# Patient Record
Sex: Female | Born: 1940 | Race: White | Hispanic: No | Smoking: Never smoker
Health system: Southern US, Community
[De-identification: ages and names within clinical notes are randomized; demographics above are authoritative.]

## PROBLEM LIST (undated history)

## (undated) DIAGNOSIS — J449 Chronic obstructive pulmonary disease, unspecified: Secondary | ICD-10-CM

## (undated) DIAGNOSIS — C3490 Malignant neoplasm of unspecified part of unspecified bronchus or lung: Secondary | ICD-10-CM

## (undated) DIAGNOSIS — W19XXXA Unspecified fall, initial encounter: Secondary | ICD-10-CM

## (undated) DIAGNOSIS — R1310 Dysphagia, unspecified: Secondary | ICD-10-CM

## (undated) DIAGNOSIS — E7800 Pure hypercholesterolemia, unspecified: Secondary | ICD-10-CM

## (undated) DIAGNOSIS — I10 Essential (primary) hypertension: Secondary | ICD-10-CM

## (undated) DIAGNOSIS — N183 Chronic kidney disease, stage 3 unspecified: Secondary | ICD-10-CM

## (undated) DIAGNOSIS — R296 Repeated falls: Secondary | ICD-10-CM

## (undated) DIAGNOSIS — R293 Abnormal posture: Secondary | ICD-10-CM

## (undated) DIAGNOSIS — S72009A Fracture of unspecified part of neck of unspecified femur, initial encounter for closed fracture: Secondary | ICD-10-CM

## (undated) DIAGNOSIS — R918 Other nonspecific abnormal finding of lung field: Secondary | ICD-10-CM

## (undated) DIAGNOSIS — I509 Heart failure, unspecified: Secondary | ICD-10-CM

## (undated) DIAGNOSIS — M1990 Unspecified osteoarthritis, unspecified site: Secondary | ICD-10-CM

## (undated) DIAGNOSIS — M6281 Muscle weakness (generalized): Secondary | ICD-10-CM

## (undated) DIAGNOSIS — G459 Transient cerebral ischemic attack, unspecified: Secondary | ICD-10-CM

## (undated) DIAGNOSIS — I639 Cerebral infarction, unspecified: Secondary | ICD-10-CM

## (undated) HISTORY — PX: VARICOSE VEIN SURGERY: SHX832

## (undated) HISTORY — DX: Malignant neoplasm of unspecified part of unspecified bronchus or lung: C34.90

## (undated) HISTORY — PX: BREAST BIOPSY: SHX20

---

## 1973-10-31 HISTORY — PX: ABDOMINAL HYSTERECTOMY: SHX81

## 1998-05-27 ENCOUNTER — Inpatient Hospital Stay (HOSPITAL_COMMUNITY): Admission: AD | Admit: 1998-05-27 | Discharge: 1998-05-28 | Payer: Self-pay | Admitting: Cardiology

## 2001-07-19 ENCOUNTER — Ambulatory Visit (HOSPITAL_COMMUNITY): Admission: RE | Admit: 2001-07-19 | Discharge: 2001-07-19 | Payer: Self-pay | Admitting: Family Medicine

## 2001-07-19 ENCOUNTER — Encounter: Payer: Self-pay | Admitting: Family Medicine

## 2001-07-25 ENCOUNTER — Encounter: Payer: Self-pay | Admitting: Family Medicine

## 2001-07-25 ENCOUNTER — Ambulatory Visit (HOSPITAL_COMMUNITY): Admission: RE | Admit: 2001-07-25 | Discharge: 2001-07-25 | Payer: Self-pay | Admitting: Family Medicine

## 2001-08-02 ENCOUNTER — Encounter: Payer: Self-pay | Admitting: General Surgery

## 2001-08-03 ENCOUNTER — Ambulatory Visit (HOSPITAL_COMMUNITY): Admission: RE | Admit: 2001-08-03 | Discharge: 2001-08-03 | Payer: Self-pay | Admitting: General Surgery

## 2001-08-03 ENCOUNTER — Encounter: Payer: Self-pay | Admitting: General Surgery

## 2003-02-25 ENCOUNTER — Ambulatory Visit (HOSPITAL_COMMUNITY): Admission: RE | Admit: 2003-02-25 | Discharge: 2003-02-25 | Payer: Self-pay | Admitting: Family Medicine

## 2003-02-25 ENCOUNTER — Encounter: Payer: Self-pay | Admitting: Family Medicine

## 2003-07-30 ENCOUNTER — Ambulatory Visit (HOSPITAL_COMMUNITY): Admission: RE | Admit: 2003-07-30 | Discharge: 2003-07-30 | Payer: Self-pay | Admitting: Family Medicine

## 2003-07-30 ENCOUNTER — Encounter: Payer: Self-pay | Admitting: Family Medicine

## 2004-07-02 ENCOUNTER — Ambulatory Visit (HOSPITAL_COMMUNITY): Admission: RE | Admit: 2004-07-02 | Discharge: 2004-07-02 | Payer: Self-pay | Admitting: Family Medicine

## 2004-07-05 ENCOUNTER — Emergency Department (HOSPITAL_COMMUNITY): Admission: EM | Admit: 2004-07-05 | Discharge: 2004-07-05 | Payer: Self-pay | Admitting: Emergency Medicine

## 2006-06-13 ENCOUNTER — Ambulatory Visit (HOSPITAL_COMMUNITY): Admission: RE | Admit: 2006-06-13 | Discharge: 2006-06-13 | Payer: Self-pay | Admitting: Family Medicine

## 2006-06-26 ENCOUNTER — Ambulatory Visit: Payer: Self-pay | Admitting: *Deleted

## 2006-06-26 ENCOUNTER — Ambulatory Visit (HOSPITAL_COMMUNITY): Admission: RE | Admit: 2006-06-26 | Discharge: 2006-06-26 | Payer: Self-pay | Admitting: Family Medicine

## 2009-04-09 ENCOUNTER — Emergency Department (HOSPITAL_COMMUNITY): Admission: EM | Admit: 2009-04-09 | Discharge: 2009-04-09 | Payer: Self-pay | Admitting: Emergency Medicine

## 2011-03-18 NOTE — Op Note (Signed)
[facility]  Patient:    Stephanie [name], Stephanie [name] Visit Number: [id number] MRN: [id number]          Service Type: DSU Location: DAY Attending Physician:  Dalia Heading Dictated by:   Franky Macho, M.D. Proc. Date: 08/03/01 Admit Date:  08/03/2001 Discharge Date: 08/03/2001   CC:         Patrica Duel, M.D.   Operative Report  AGE:  70 years old.  PREOPERATIVE DIAGNOSIS:  Left breast mass, nonpalpable.  POSTOPERATIVE DIAGNOSIS:  Left breast mass, nonpalpable.  PROCEDURE:  Left breast biopsy after needle localization.  SURGEON:  Franky Macho, M.D.  ANESTHESIA:  MAC.  INDICATIONS:  Patient is a 70 year old white female who presents with an abnormal mammogram of the left breast.  The risks and benefits of the procedure including bleeding, infection, and recurrence were fully explained to the patient, who gave informed consent.  DESCRIPTION OF PROCEDURE:  Patient was placed in the supine position.  She had already undergone needle localization in the x-ray department.  The left breast was prepped and draped using the usual sterile technique with Betadine. Xylocaine 1% was used for local anesthesia.  A curvilinear incision was made in the lower aspect of the left breast where the guidewire had been placed.  The guidewire was then followed down to the area of suspicion.  Grossly, a 4-cm oblong mass was found and this was excised.  This mass appeared benign in nature.  Further pathology is pending. Any bleeding was controlled using Bovie electrocautery.  The skin was reapproximated using a 4-0 Vicryl subcuticular suture.  Steri-Strips and dry sterile dressing were applied.  All tape and needle counts were correct at the end of the procedure.  The patient was awakened and transferred to Day Surgery in stable condition.  COMPLICATIONS:  None.  SPECIMEN:  Left breast mass.  BLOOD LOSS:  Minimal. Dictated by:   Franky Macho, M.D. Attending  Physician:  Dalia Heading DD:  08/03/01 TD:  08/04/01 Job: [id number]

## 2011-03-18 NOTE — Procedures (Signed)
[name], [name] NO.:  [id number]   MEDICAL RECORD NO.:  [id number]          PATIENT TYPE:  OUT   LOCATION:  RAD                           FACILITY:  [facility]   PHYSICIAN:  Farris Has. Dorethea Clan, MD  DATE OF BIRTH:  1941-09-13   DATE OF PROCEDURE:  06/26/2006  DATE OF DISCHARGE:                                  ECHOCARDIOGRAM   PRIMARY CARE PHYSICIAN:  Scott A. Gerda Diss, M.D.   TAPE NUMBER:  [id number].   TAPE COUNT:  [id number] through 3244.   This is for LV systolic function.  The technical quality of this study is  adequate.   M-MODE TRACING:  The aorta is 27 mm.   The left atrium is 48 mm.   The septum is 17 mm.   The posterior wall is 15 mm.   Left ventricular diastolic dimension is 38 mm.   Left ventricular systolic dimension is 25 mm.   2-D AND DOPPLER IMAGING:  The left ventricle is normal size.  There is  preserved left ventricular systolic function with an estimated ejection  fraction of 65-70%.  There are no wall motion abnormalities.  There is mild  concentric left ventricular hypertrophy with some upper septal hypertrophy  without LV outflow gradient.   The right ventricle is normal size with normal systolic function.  The  estimated RV systolic pressure is about 30-35 mmHg.   Both atria are dilated, left greater than right.   The aortic valve is sclerotic without stenosis or regurgitation.   Mitral valve has trivial regurgitation, no stenosis.   Tricuspid valve with mild regurgitation.   No pericardial effusion.   The inferior vena cava appears to be normal size.      Farris Has. Dorethea Clan, MD  Electronically Signed     JMH/MEDQ  D:  06/26/2006  T:  06/26/2006  Job:  [id number]   cc:   Lorin Picket A. Gerda Diss, MD  Fax: [phone]

## 2011-03-18 NOTE — H&P (Signed)
[facility]  Patient:    Stephanie [name], Stephanie [name] Visit Number: [id number] MRN: [id number]          Service Type: OUT Location: RAD Attending Physician:  Patrica Duel Dictated by:   Franky Macho, M.D. Admit Date:  07/25/2001 Discharge Date: 07/25/2001   CC:         Patrica Duel, M.D.   History and Physical  AGE:  70  CHIEF COMPLAINT:  Left breast mass, nonpalpable.  HISTORY OF PRESENT ILLNESS:  The patient is a 70 year old white female who is referred for evaluation and treatment of a left breast mass.  The patient does not feel it.  It was found on routine mammography.  She denies any nipple discharge or family history of breast cancer.  She was first pregnant at age 61, had three children, did not breast-feed.  She had a hysterectomy in 1978.  PAST MEDICAL HISTORY:  Otherwise unremarkable.  PAST SURGICAL HISTORY:  As noted above.  Varicose vein stripping in 1983.  CURRENT MEDICATIONS:  None.  ALLERGIES:  No known drug allergies.  REVIEW OF SYSTEMS:  The patient denies drinking or smoking.  She denies any other cardiopulmonary difficulties.  PHYSICAL EXAMINATION:  GENERAL:  Well-developed, well-nourished white female in no acute distress.  VITAL SIGNS:  She is afebrile, and vital signs are stable.  NECK:  Supple.  Without lymphadenopathy.  LUNGS:  Clear to auscultation, with equal breath sounds bilaterally.  HEART:  Regular rate and rhythm.  Without S3, S4, murmurs.  BREASTS:  Right breast examination reveals large, dense breast.  No dominant mass, nipple discharge, or dimpling noted.  The axilla was negative for palpable nodes.  Left breast examination was, likewise, unremarkable.  LABORATORY DATA:  Mammogram and ultrasound reports revealed a 4 x 2.5 cm mass in the lateral aspect of the left breast.  It is solid in nature.  IMPRESSION:  Nonpalpable left breast mass.  PLAN:  The patient is scheduled for left breast biopsy after  needle localization on August 03, 2001.  The risks and benefits of the procedure including bleeding and infection were fully explained to the patient, who gave informed consent. Dictated by:   Franky Macho, M.D. Attending Physician:  Patrica Duel DD:  08/02/01 TD:  08/02/01 Job: [id number]

## 2011-09-17 ENCOUNTER — Inpatient Hospital Stay (HOSPITAL_COMMUNITY): Payer: Medicare PPO

## 2011-09-17 ENCOUNTER — Emergency Department (HOSPITAL_COMMUNITY): Payer: Medicare PPO

## 2011-09-17 ENCOUNTER — Inpatient Hospital Stay (HOSPITAL_COMMUNITY)
Admission: EM | Admit: 2011-09-17 | Discharge: 2011-09-19 | DRG: 066 | Disposition: A | Discharge status: Home / Self Care | Payer: Medicare PPO | Attending: Internal Medicine | Admitting: Internal Medicine

## 2011-09-17 ENCOUNTER — Other Ambulatory Visit: Payer: Self-pay

## 2011-09-17 DIAGNOSIS — J984 Other disorders of lung: Secondary | ICD-10-CM | POA: Diagnosis present

## 2011-09-17 DIAGNOSIS — I6350 Cerebral infarction due to unspecified occlusion or stenosis of unspecified cerebral artery: Principal | ICD-10-CM | POA: Diagnosis present

## 2011-09-17 DIAGNOSIS — I639 Cerebral infarction, unspecified: Secondary | ICD-10-CM | POA: Diagnosis present

## 2011-09-17 DIAGNOSIS — E039 Hypothyroidism, unspecified: Secondary | ICD-10-CM | POA: Diagnosis present

## 2011-09-17 DIAGNOSIS — I10 Essential (primary) hypertension: Secondary | ICD-10-CM | POA: Diagnosis present

## 2011-09-17 DIAGNOSIS — R471 Dysarthria and anarthria: Secondary | ICD-10-CM | POA: Diagnosis present

## 2011-09-17 DIAGNOSIS — E785 Hyperlipidemia, unspecified: Secondary | ICD-10-CM | POA: Diagnosis present

## 2011-09-17 HISTORY — DX: Essential (primary) hypertension: I10

## 2011-09-17 HISTORY — DX: Unspecified osteoarthritis, unspecified site: M19.90

## 2011-09-17 LAB — CBC
MCH: 32.5 pg (ref 26.0–34.0)
MCHC: 33.9 g/dL (ref 30.0–36.0)
MCV: 95.6 fL (ref 78.0–100.0)
Platelets: 218 10*3/uL (ref 150–400)
RDW: 12.1 % (ref 11.5–15.5)

## 2011-09-17 LAB — COMPREHENSIVE METABOLIC PANEL
ALT: 13 U/L (ref 0–35)
CO2: 28 mEq/L (ref 19–32)
Calcium: 10.1 mg/dL (ref 8.4–10.5)
GFR calc Af Amer: 56 mL/min — ABNORMAL LOW (ref >90)
GFR calc non Af Amer: 48 mL/min — ABNORMAL LOW (ref >90)
Glucose, Bld: 132 mg/dL — ABNORMAL HIGH (ref 70–99)
Potassium: 4.2 mEq/L (ref 3.5–5.1)
Total Bilirubin: 0.7 mg/dL (ref 0.3–1.2)

## 2011-09-17 LAB — URINE MICROSCOPIC-ADD ON

## 2011-09-17 LAB — URINALYSIS, ROUTINE W REFLEX MICROSCOPIC: Nitrite: NEGATIVE

## 2011-09-17 LAB — POCT I-STAT TROPONIN I: Troponin i, poc: 0.01 ng/mL (ref 0.00–0.08)

## 2011-09-17 LAB — PROTIME-INR: Prothrombin Time: 13.7 seconds (ref 11.6–15.2)

## 2011-09-17 MED ORDER — ONDANSETRON HCL 4 MG/2ML IJ SOLN: 4.0000 mg | Freq: Four times a day (QID) | INTRAMUSCULAR | Status: DC | PRN

## 2011-09-17 MED ORDER — SODIUM CHLORIDE 0.9 % IV SOLN
INTRAVENOUS | Status: DC
  Administered 2011-09-17: 1000 mL via INTRAVENOUS

## 2011-09-17 MED ORDER — SODIUM CHLORIDE 0.9 % IV SOLN
INTRAVENOUS | Status: DC
  Administered 2011-09-17 – 2011-09-18 (×2): via INTRAVENOUS

## 2011-09-17 MED ORDER — AZITHROMYCIN 250 MG PO TABS
500.0000 mg | ORAL_TABLET | Freq: Once | ORAL | Status: AC
  Administered 2011-09-17: 500 mg via ORAL
  Filled 2011-09-17: qty 2

## 2011-09-17 MED ORDER — SODIUM CHLORIDE 0.9 % IJ SOLN
INTRAMUSCULAR | Status: AC
  Administered 2011-09-18: 3 mL
  Filled 2011-09-17: qty 3

## 2011-09-17 MED ORDER — ACETAMINOPHEN 650 MG RE SUPP: 650.0000 mg | RECTAL | Status: DC | PRN

## 2011-09-17 MED ORDER — ACETAMINOPHEN 325 MG PO TABS: 650.0000 mg | ORAL_TABLET | ORAL | Status: DC | PRN

## 2011-09-17 MED ORDER — ATENOLOL 25 MG PO TABS
50.0000 mg | ORAL_TABLET | Freq: Every day | ORAL | Status: DC
  Administered 2011-09-17 – 2011-09-19 (×3): 50 mg via ORAL
  Filled 2011-09-17: qty 2
  Filled 2011-09-17: qty 1
  Filled 2011-09-17: qty 2

## 2011-09-17 MED ORDER — IOHEXOL 300 MG/ML  SOLN
80.0000 mL | Freq: Once | INTRAMUSCULAR | Status: AC | PRN
  Administered 2011-09-17: 80 mL via INTRAVENOUS

## 2011-09-17 MED ORDER — SODIUM CHLORIDE 0.9 % IV SOLN
INTRAVENOUS | Status: AC
  Administered 2011-09-17: 15:00:00 via INTRAVENOUS

## 2011-09-17 MED ORDER — HEPARIN SODIUM (PORCINE) 5000 UNIT/ML IJ SOLN
5000.0000 [IU] | Freq: Three times a day (TID) | INTRAMUSCULAR | Status: DC
  Administered 2011-09-17 – 2011-09-19 (×8): 5000 [IU] via SUBCUTANEOUS
  Filled 2011-09-17 (×7): qty 1

## 2011-09-17 MED ORDER — ASPIRIN 325 MG PO TABS
325.0000 mg | ORAL_TABLET | Freq: Every day | ORAL | Status: DC
  Administered 2011-09-17 – 2011-09-19 (×3): 325 mg via ORAL
  Filled 2011-09-17 (×3): qty 1

## 2011-09-17 MED ORDER — CEFTRIAXONE SODIUM 1 G IJ SOLR
1.0000 g | INTRAMUSCULAR | Status: DC
  Administered 2011-09-17: 1 g via INTRAVENOUS
  Filled 2011-09-17 (×3): qty 10

## 2011-09-17 MED ORDER — SENNOSIDES-DOCUSATE SODIUM 8.6-50 MG PO TABS: 1.0000 | ORAL_TABLET | Freq: Every evening | ORAL | Status: DC | PRN

## 2011-09-17 NOTE — ED Notes (Signed)
Daughter states spoke with pt yesterday around 7pm and pt's speech was normal for pt.  Daughter says pt's speech has been getting worse over the past few years.  Reports she spoke to her on the phone at 0745 this morning and speech was very unclear.  Says sounded like a child on the phone.  Pt went to see Dr. Phillips Odor this morning and he did a neuro exam and said pt 's tongue deviated to one side and she could not puff her cheeks out.  Pt denies any weakness.  Pt c/o dizziness.

## 2011-09-17 NOTE — ED Notes (Signed)
Report given to receiving nurse on floor. NAD

## 2011-09-17 NOTE — ED Provider Notes (Signed)
Scribed for [name], MD, the patient was seen in room [facility]14/[facility]. This chart was scribed by [employer]. The patient's care started at 10:02  CSN: [id number] Arrival date & time: 09/17/2011  9:50 AM   First MD Initiated Contact with Patient 09/17/11 1002      Chief Complaint  Patient presents with  . Stroke Symptoms   HPI Stephanie [name] is a 70 y.o. female who presents to the Emergency Department complaining of Stroke Symptoms. Per daughter, the last time patient was seen normal was at about 6:30pm last night. Patient reports that she went to bed at about 8 pm. She reports that she woke up feeling dizzy. Dizziness is currently resolved. Per daughter, patient's speech is very slow but states that patient walks with normal gait. Daughter states that patient's face doesn't look asymetrical. Denies any changes in vision. Daughter states that patient's speech has been slowly deteriorating for the past five years, with current symptoms worsened than usual. Patient was sent to the ER by Dr Phillips Odor because her tongue was deviated to one side and she was unable to puff her cheeks out. She denies any current weakness.  Past Medical History  Diagnosis Date  . Hypertension     History reviewed. No pertinent past surgical history.  No family history on file.  History  Substance Use Topics  . Smoking status: Never Smoker   . Smokeless tobacco: Not on file  . Alcohol Use: No    OB History    Grav Para Term Preterm Abortions TAB SAB Ect Mult Living                  Review of Systems  Constitutional: Negative for fever.       10 Systems reviewed and are negative for acute change except as noted in the HPI.  HENT: Negative for rhinorrhea.   Eyes: Negative for discharge and redness.  Respiratory: Negative for cough and shortness of breath.   Cardiovascular: Negative for chest pain.  Gastrointestinal: Negative for vomiting, abdominal pain and diarrhea.  Genitourinary: Negative  for dysuria.  Musculoskeletal: Negative for back pain.  Skin: Negative for rash.  Neurological: Positive for speech difficulty (slowed speech). Negative for dizziness, syncope, facial asymmetry, weakness, numbness and headaches.  Psychiatric/Behavioral: Negative for suicidal ideas, hallucinations and confusion.    Allergies  Review of patient's allergies indicates no known allergies.  Home Medications  No current outpatient prescriptions on file.  BP 204/81  Pulse 61  Temp(Src) 97.7 F (36.5 C) (Oral)  Resp 24  Ht 5\' 7"  (1.702 m)  Wt 175 lb (79.379 kg)  BMI 27.41 kg/m2  SpO2 99%  Physical Exam  Nursing note and vitals reviewed. Constitutional: She is oriented to person, place, and time. She appears well-developed and well-nourished. She does not have a sickly appearance.       Awake, alert, nontoxic appearance with baseline speech for patient.  HENT:  Head: Normocephalic and atraumatic.  Mouth/Throat: No oropharyngeal exudate.  Eyes: EOM are normal. Pupils are equal, round, and reactive to light. Right eye exhibits no discharge. Left eye exhibits no discharge.  Neck: Neck supple.  Cardiovascular: Normal rate and regular rhythm.   No murmur heard. Pulmonary/Chest: Effort normal and breath sounds normal. No stridor. No respiratory distress. She has no wheezes. She has no rales. She exhibits no tenderness.  Abdominal: Soft. Bowel sounds are normal. She exhibits no mass. There is no tenderness. There is no rebound.  Musculoskeletal: She exhibits no  tenderness.       Baseline ROM, moves extremities with no obvious new focal weakness.  Lymphadenopathy:    She has no cervical adenopathy.  Neurological: She is alert and oriented to person, place, and time. No cranial nerve deficit.       Awake, alert, cooperative and aware of situation; motor strength bilaterally; sensation normal to light touch bilaterally; peripheral visual fields full to confrontation; no facial asymmetry; tongue  mildly deviated to the right; major cranial nerves appear intact; no pronator drift,   Slowed speech  Skin: Skin is warm and dry. No rash noted.  Psychiatric: She has a normal mood and affect. Her speech is slurred.    ED Course  Procedures  DIAGNOSTIC STUDIES: Oxygen Saturation is 99% on room air, normal by my interpretation.    COORDINATION OF CARE: 10:28 - EDP examined patient at bedside and ordered the following Orders Placed This Encounter  Procedures  . CT Head Wo Contrast  . DG Chest Portable 1 View  . CBC  . Comprehensive metabolic panel  . Protime-INR  . Urinalysis with microscopic  . Cardiac monitoring  . Cardiac monitoring  . Pulse oximetry, continuous  . POCT i-Stat troponin I  . ED EKG  . Saline lock IV    Results for orders placed during the hospital encounter of 09/17/11  CBC      Component Value Range   WBC 7.5  4.0 - 10.5 (K/uL)   RBC 4.59  3.87 - 5.11 (MIL/uL)   Hemoglobin 14.9  12.0 - 15.0 (g/dL)   HCT 91.4  78.2 - 95.6 (%)   MCV 95.6  78.0 - 100.0 (fL)   MCH 32.5  26.0 - 34.0 (pg)   MCHC 33.9  30.0 - 36.0 (g/dL)   RDW 21.3  08.6 - 57.8 (%)   Platelets 218  150 - 400 (K/uL)  COMPREHENSIVE METABOLIC PANEL      Component Value Range   Sodium 137  135 - 145 (mEq/L)   Potassium 4.2  3.5 - 5.1 (mEq/L)   Chloride 102  96 - 112 (mEq/L)   CO2 28  19 - 32 (mEq/L)   Glucose, Bld 132 (*) 70 - 99 (mg/dL)   BUN 18  6 - 23 (mg/dL)   Creatinine, Ser 4.69 (*) 0.50 - 1.10 (mg/dL)   Calcium 62.9  8.4 - 10.5 (mg/dL)   Total Protein 7.9  6.0 - 8.3 (g/dL)   Albumin 4.1  3.5 - 5.2 (g/dL)   AST 21  0 - 37 (U/L)   ALT 13  0 - 35 (U/L)   Alkaline Phosphatase 85  39 - 117 (U/L)   Total Bilirubin 0.7  0.3 - 1.2 (mg/dL)   GFR calc non Af Amer 48 (*) >90 (mL/min)   GFR calc Af Amer 56 (*) >90 (mL/min)  PROTIME-INR      Component Value Range   Prothrombin Time 13.7  11.6 - 15.2 (seconds)   INR 1.03  0.00 - 1.49   POCT I-STAT TROPONIN I      Component Value Range     Troponin i, poc 0.01  0.00 - 0.08 (ng/mL)   Comment 3              Ct Head Wo Contrast  09/17/2011  *RADIOLOGY REPORT*  Clinical Data: Acute onset of abnormal speech pattern. Tongue deviation.  CT HEAD WITHOUT CONTRAST  Technique:  Contiguous axial images were obtained from the base of the skull through the vertex without  contrast.  Comparison: None.  Findings: Chronic microvascular ischemic change is noted.  No evidence of acute infarction, hemorrhage, mass lesion, mass effect, midline shift or abnormal extra-axial fluid collection. There is no hydrocephalus or pneumocephalus.  Atherosclerotic vascular disease is noted.  There is some cortical atrophy.  IMPRESSION: Atrophy and chronic microvascular ischemic change.  No acute abnormality.  Original Report Authenticated By: Bernadene Bell. D'ALESSIO, M.D.    Dg Chest Portable 1 View  09/17/2011  *RADIOLOGY REPORT*  Clinical Data: Stroke, slurred speech  PORTABLE CHEST - 1 VIEW  Comparison: 06/13/2006  Findings: There is a poorly marginated new airspace opacity peripherally in the mid left lung.  Right lung remains clear with somewhat prominent interstitial markings.  No effusion.  Heart size upper limits normal.  Mild thoracic spine degenerative change.  IMPRESSION:  1.  Ill-defined peripheral airspace opacity in the left midlung, new since previous exam.  Original Report Authenticated By: Osa Craver, M.D.     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  MDM: Symptoms consistent with a stroke predominantly affecting speech no major motor paralysis or dysfunction. Head CT negative for head bleed. Onset of symptoms were sometime after 6:30 in the ED and  yesterday patient is therefore not a candidate for any TPA modalities. Workup without significant findings other than the chest x-ray which raises questions of some type of air space opacity on the left side. It is possible this could represent a pneumonia or aspiration, patient has not been in the hospital recently or in the past 90 days, patient also does not have any symptoms consistent with an infectious type pneumonia. However to be careful will treat with IV Rocephin and by mouth Zithromax patient has not at this time a candidate for going to the ICU and therefore does not need blood cultures or IV Zithromax. Discussed with the admitting hospitalist team they will admit the patient to a regular medical bed.  CRITICAL CARE Performed by: [name].   Total critical care time: 30  Critical care time was exclusive of separately billable procedures and treating other patients.  Critical care was necessary to treat or prevent imminent or life-threatening deterioration.  Critical care was time spent personally by me on the following activities: development of treatment plan with patient and/or surrogate as well as nursing, discussions with consultants, evaluation of patient's response to treatment, examination of patient, obtaining history from patient or surrogate, ordering and performing treatments and interventions, ordering and review of laboratory studies, ordering and review of radiographic studies, pulse oximetry and re-evaluation of patient's condition.  Impression: Stroke    Production assistant, radio I personally performed the services described in this documentation, which was scribed in my presence. The recorded information has been reviewed and considered.       [name], MD 09/17/11 [phone]

## 2011-09-17 NOTE — ED Notes (Signed)
Phoned charge nurse on floor. Bed to be assigned shortly, family aware.

## 2011-09-17 NOTE — ED Notes (Signed)
Correction, pt says r hand feels weak.

## 2011-09-17 NOTE — ED Notes (Signed)
Room currently dirty. Hospitalist at bedside. NAD.

## 2011-09-17 NOTE — H&P (Signed)
Stephanie [name] MRN: [id number] DOB/AGE: 69-Aug-1942 69 y.o. Primary Care Physician:Dr Phillips Odor Admit date: 09/17/2011 Chief Complaint: Abnormal speech, right arm weakness. HPI: This 70 year old lady, who has a history of hypertension, presents with the above symptoms. At 3 AM this morning she woke up and felt somewhat lightheaded. She went to the bathroom and then fell back to sleep. She then awoke again at 7 AM whereupon she felt that her right arm was slightly weak and that her speech was abnormal. She called her daughter on the telephone who immediately recognized that her speech was clearly abnormal. The problem with her speech appeared to be that it was slurred but also that she had difficulty saying the words that she wanted to say. She denies any weakness in her legs. She thinks her gait, however, was somewhat abnormal with unsteadiness. She now presents to the emergency room with the above symptoms and we are asked to admit this patient. CT brain scan has been done and is unremarkable.    Past medical history: 1. Hypertension. 2. Apparent stroke in 2003. 3. Left breast lump, excised, benign in 2004.      Family history: Noncontributory.  Social history: She has been a widow since the last 5 years and lives alone. She has never smoked cigarettes. She does not drink alcohol. 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  Allergies: No Known Allergies  Medications Prior to Admission  Medication Dose Route Frequency Provider Last Rate Last Dose  . 0.9 %  sodium chloride infusion   Intravenous Continuous [name], MD 100 mL/hr at 09/17/11 1045 1,000 mL at 09/17/11 1045  . azithromycin (ZITHROMAX) tablet 500 mg  500 mg Oral Once [name], MD   500 mg at 09/17/11 1308  . cefTRIAXone (ROCEPHIN) 1 g in dextrose 5 % 50 mL IVPB  1 g Intravenous Q24H [name], MD   1 g at 09/17/11 1310   No current outpatient prescriptions on file as of 09/17/2011.        WUJ:WJXBJ from the symptoms mentioned above,there are no other symptoms referable to all systems reviewed.  Physical Exam: Blood pressure 148/53, pulse 56, temperature 98.1 F (36.7 C), temperature source Oral, resp. rate 16, height 5\' 7"  (1.702 m), weight 79.379 kg (175 lb), SpO2 96.00%. She looks systemically well. She has dysarthria. She has tongue deviation to the right. She does not appear to have facial asymmetry. Visual fields seem to be intact. There are no carotid bruits. Examination of her limbs shows slight weakness in the right arm but only marginally so compared to the left. Both legs have equal strength. The right plantar is upgoing. There are no cerebellar signs. Her cognition is normal and she does not have any dysphasia at this point. Cardiovascular: Heart sounds are present and in sinus rhythm. There are no murmurs. Respiratory: Lung fields are clear. There is no lymphadenopathy in her neck or supraclavicular area. Abdomen: Soft, nontender, no hepatosplenomegaly. There are no masses.  Results for orders placed during the hospital encounter of 09/17/11 (from the past 48 hour(s))  POCT I-STAT TROPONIN I     Status: Normal   Collection Time   09/17/11 10:02 AM      Component Value Range Comment   Troponin i, poc 0.01  0.00 - 0.08 (ng/mL)    Comment 3            CBC     Status: Normal   Collection Time  09/17/11 10:10 AM      Component Value Range Comment   WBC 7.5  4.0 - 10.5 (K/uL)    RBC 4.59  3.87 - 5.11 (MIL/uL)    Hemoglobin 14.9  12.0 - 15.0 (g/dL)    HCT 16.1  09.6 - 04.5 (%)    MCV 95.6  78.0 - 100.0 (fL)    MCH 32.5  26.0 - 34.0 (pg)    MCHC 33.9  30.0 - 36.0 (g/dL)    RDW 40.9  81.1 - 91.4 (%)    Platelets 218  150 - 400 (K/uL)   COMPREHENSIVE METABOLIC PANEL     Status: Abnormal   Collection Time   09/17/11 10:10 AM      Component Value Range Comment   Sodium 137  135 - 145 (mEq/L)    Potassium 4.2  3.5 - 5.1 (mEq/L)    Chloride 102  96 - 112  (mEq/L)    CO2 28  19 - 32 (mEq/L)    Glucose, Bld 132 (*) 70 - 99 (mg/dL)    BUN 18  6 - 23 (mg/dL)    Creatinine, Ser 7.82 (*) 0.50 - 1.10 (mg/dL)    Calcium 95.6  8.4 - 10.5 (mg/dL)    Total Protein 7.9  6.0 - 8.3 (g/dL)    Albumin 4.1  3.5 - 5.2 (g/dL)    AST 21  0 - 37 (U/L)    ALT 13  0 - 35 (U/L)    Alkaline Phosphatase 85  39 - 117 (U/L)    Total Bilirubin 0.7  0.3 - 1.2 (mg/dL)    GFR calc non Af Amer 48 (*) >90 (mL/min)    GFR calc Af Amer 56 (*) >90 (mL/min)   PROTIME-INR     Status: Normal   Collection Time   09/17/11 10:10 AM      Component Value Range Comment   Prothrombin Time 13.7  11.6 - 15.2 (seconds)    INR 1.03  0.00 - 1.49    URINALYSIS, ROUTINE W REFLEX MICROSCOPIC     Status: Abnormal   Collection Time   09/17/11  1:01 PM      Component Value Range Comment   Color, Urine YELLOW  YELLOW     Appearance CLEAR  CLEAR     Specific Gravity, Urine 1.025  1.005 - 1.030     pH 6.0  5.0 - 8.0     Glucose, UA NEGATIVE  NEGATIVE (mg/dL)    Hgb urine dipstick SMALL (*) NEGATIVE     Bilirubin Urine NEGATIVE  NEGATIVE     Ketones, ur NEGATIVE  NEGATIVE (mg/dL)    Protein, ur 30 (*) NEGATIVE (mg/dL)    Urobilinogen, UA 0.2  0.0 - 1.0 (mg/dL)    Nitrite NEGATIVE  NEGATIVE     Leukocytes, UA NEGATIVE  NEGATIVE    URINE MICROSCOPIC-ADD ON     Status: Abnormal   Collection Time   09/17/11  1:01 PM      Component Value Range Comment   Squamous Epithelial / LPF FEW (*) RARE     WBC, UA 3-6  <3 (WBC/hpf)    RBC / HPF 3-6  <3 (RBC/hpf)    Bacteria, UA FEW (*) RARE       Ct Head Wo Contrast  09/17/2011  *RADIOLOGY REPORT*  Clinical Data: Acute onset of abnormal speech pattern. Tongue deviation.  CT HEAD WITHOUT CONTRAST  Technique:  Contiguous axial images were obtained from the base of the  skull through the vertex without contrast.  Comparison: None.  Findings: Chronic microvascular ischemic change is noted.  No evidence of acute infarction, hemorrhage, mass lesion,  mass effect, midline shift or abnormal extra-axial fluid collection. There is no hydrocephalus or pneumocephalus.  Atherosclerotic vascular disease is noted.  There is some cortical atrophy.  IMPRESSION: Atrophy and chronic microvascular ischemic change.  No acute abnormality.  Original Report Authenticated By: Bernadene Bell. D'ALESSIO, M.D.   Dg Chest Portable 1 View  09/17/2011  *RADIOLOGY REPORT*  Clinical Data: Stroke, slurred speech  PORTABLE CHEST - 1 VIEW  Comparison: 06/13/2006  Findings: There is a poorly marginated new airspace opacity peripherally in the mid left lung.  Right lung remains clear with somewhat prominent interstitial markings.  No effusion.  Heart size upper limits normal.  Mild thoracic spine degenerative change.  IMPRESSION:  1.  Ill-defined peripheral airspace opacity in the left midlung, new since previous exam.  Original Report Authenticated By: Osa Craver, M.D.   Impression: 1. Left brain stroke clinically. Patient is outside the window of opportunity for TPA. 2. Hypertension. 3. Ill defined abnormality in the left midlung without any clear clinical evidence of pneumonia. Patient has never smoked in her life.     Plan: 1. Admit. 2. MRI of the brain. 3. Bilateral carotid Dopplers. 4. CT scan of the chest to further evaluate abnormality in the left mid lung. 5. Aspirin daily. 6. Monitor blood pressure closely. Further recommendations will depend on patient's hospital progress.      GOSRANI,NIMISH C 09/17/2011, 1:49 PM

## 2011-09-17 NOTE — ED Notes (Signed)
Pt is talking with RN, Mardene Celeste. Family at bedside.

## 2011-09-18 ENCOUNTER — Inpatient Hospital Stay (HOSPITAL_COMMUNITY): Payer: Medicare PPO

## 2011-09-18 LAB — BASIC METABOLIC PANEL
CO2: 29 mEq/L (ref 19–32)
Calcium: 9.7 mg/dL (ref 8.4–10.5)
GFR calc Af Amer: 57 mL/min — ABNORMAL LOW (ref >90)
GFR calc non Af Amer: 49 mL/min — ABNORMAL LOW (ref >90)
Sodium: 135 mEq/L (ref 135–145)

## 2011-09-18 LAB — LIPID PANEL
Cholesterol: 207 mg/dL — ABNORMAL HIGH (ref 0–200)
LDL Cholesterol: 130 mg/dL — ABNORMAL HIGH (ref 0–99)
Triglycerides: 103 mg/dL (ref <150)
VLDL: 21 mg/dL (ref 0–40)

## 2011-09-18 MED ORDER — SIMVASTATIN 20 MG PO TABS
20.0000 mg | ORAL_TABLET | Freq: Every day | ORAL | Status: DC
  Administered 2011-09-18: 20 mg via ORAL
  Filled 2011-09-18: qty 1

## 2011-09-18 MED ORDER — ZOLPIDEM TARTRATE 5 MG PO TABS
5.0000 mg | ORAL_TABLET | Freq: Every evening | ORAL | Status: DC | PRN
  Administered 2011-09-18: 5 mg via ORAL
  Filled 2011-09-18: qty 1

## 2011-09-18 MED ORDER — LORAZEPAM 2 MG/ML IJ SOLN
0.5000 mg | Freq: Once | INTRAMUSCULAR | Status: AC
  Administered 2011-09-19: 0.5 mg via INTRAVENOUS
  Filled 2011-09-18: qty 1

## 2011-09-18 NOTE — Progress Notes (Signed)
Subjective: This lady is improved in particular her speech appears to be almost normal. The strength in her right arm also is better. Unfortunately, the CT chest scan raises the possibility of malignancy. Please see below.           Physical Exam: Blood pressure 134/76, pulse 59, temperature 97.8 F (36.6 C), temperature source Oral, resp. rate 20, height 5\' 7"  (1.702 m), weight 83.8 kg (184 lb 11.9 oz), SpO2 98.00%. She looks systemically well. Her dysarthria is less. Indeed, the weakness in the right arm is much improved and she is now normal strength compared to the left arm. Otherwise there are no new physical examination findings.   Investigations: Results for orders placed during the hospital encounter of 09/17/11 (from the past 48 hour(s))  POCT I-STAT TROPONIN I     Status: Normal   Collection Time   09/17/11 10:02 AM      Component Value Range Comment   Troponin i, poc 0.01  0.00 - 0.08 (ng/mL)    Comment 3            CBC     Status: Normal   Collection Time   09/17/11 10:10 AM      Component Value Range Comment   WBC 7.5  4.0 - 10.5 (K/uL)    RBC 4.59  3.87 - 5.11 (MIL/uL)    Hemoglobin 14.9  12.0 - 15.0 (g/dL)    HCT 16.1  09.6 - 04.5 (%)    MCV 95.6  78.0 - 100.0 (fL)    MCH 32.5  26.0 - 34.0 (pg)    MCHC 33.9  30.0 - 36.0 (g/dL)    RDW 40.9  81.1 - 91.4 (%)    Platelets 218  150 - 400 (K/uL)   COMPREHENSIVE METABOLIC PANEL     Status: Abnormal   Collection Time   09/17/11 10:10 AM      Component Value Range Comment   Sodium 137  135 - 145 (mEq/L)    Potassium 4.2  3.5 - 5.1 (mEq/L)    Chloride 102  96 - 112 (mEq/L)    CO2 28  19 - 32 (mEq/L)    Glucose, Bld 132 (*) 70 - 99 (mg/dL)    BUN 18  6 - 23 (mg/dL)    Creatinine, Ser 7.82 (*) 0.50 - 1.10 (mg/dL)    Calcium 95.6  8.4 - 10.5 (mg/dL)    Total Protein 7.9  6.0 - 8.3 (g/dL)    Albumin 4.1  3.5 - 5.2 (g/dL)    AST 21  0 - 37 (U/L)    ALT 13  0 - 35 (U/L)    Alkaline Phosphatase 85  39 - 117 (U/L)     Total Bilirubin 0.7  0.3 - 1.2 (mg/dL)    GFR calc non Af Amer 48 (*) >90 (mL/min)    GFR calc Af Amer 56 (*) >90 (mL/min)   PROTIME-INR     Status: Normal   Collection Time   09/17/11 10:10 AM      Component Value Range Comment   Prothrombin Time 13.7  11.6 - 15.2 (seconds)    INR 1.03  0.00 - 1.49    URINALYSIS, ROUTINE W REFLEX MICROSCOPIC     Status: Abnormal   Collection Time   09/17/11  1:01 PM      Component Value Range Comment   Color, Urine YELLOW  YELLOW     Appearance CLEAR  CLEAR     Specific Gravity,  Urine 1.025  1.005 - 1.030     pH 6.0  5.0 - 8.0     Glucose, UA NEGATIVE  NEGATIVE (mg/dL)    Hgb urine dipstick SMALL (*) NEGATIVE     Bilirubin Urine NEGATIVE  NEGATIVE     Ketones, ur NEGATIVE  NEGATIVE (mg/dL)    Protein, ur 30 (*) NEGATIVE (mg/dL)    Urobilinogen, UA 0.2  0.0 - 1.0 (mg/dL)    Nitrite NEGATIVE  NEGATIVE     Leukocytes, UA NEGATIVE  NEGATIVE    URINE MICROSCOPIC-ADD ON     Status: Abnormal   Collection Time   09/17/11  1:01 PM      Component Value Range Comment   Squamous Epithelial / LPF FEW (*) RARE     WBC, UA 3-6  <3 (WBC/hpf)    RBC / HPF 3-6  <3 (RBC/hpf)    Bacteria, UA FEW (*) RARE    LIPID PANEL     Status: Abnormal   Collection Time   09/18/11  6:40 AM      Component Value Range Comment   Cholesterol 207 (*) 0 - 200 (mg/dL)    Triglycerides 161  <150 (mg/dL)    HDL 56  >09 (mg/dL)    Total CHOL/HDL Ratio 3.7      VLDL 21  0 - 40 (mg/dL)    LDL Cholesterol 604 (*) 0 - 99 (mg/dL)   BASIC METABOLIC PANEL     Status: Abnormal   Collection Time   09/18/11  6:40 AM      Component Value Range Comment   Sodium 135  135 - 145 (mEq/L)    Potassium 4.7  3.5 - 5.1 (mEq/L)    Chloride 102  96 - 112 (mEq/L)    CO2 29  19 - 32 (mEq/L)    Glucose, Bld 104 (*) 70 - 99 (mg/dL)    BUN 16  6 - 23 (mg/dL)    Creatinine, Ser 5.40 (*) 0.50 - 1.10 (mg/dL)    Calcium 9.7  8.4 - 10.5 (mg/dL)    GFR calc non Af Amer 49 (*) >90 (mL/min)    GFR  calc Af Amer 57 (*) >90 (mL/min)      Ct Head Wo Contrast  09/17/2011  *RADIOLOGY REPORT*  Clinical Data: Acute onset of abnormal speech pattern. Tongue deviation.  CT HEAD WITHOUT CONTRAST  Technique:  Contiguous axial images were obtained from the base of the skull through the vertex without contrast.  Comparison: None.  Findings: Chronic microvascular ischemic change is noted.  No evidence of acute infarction, hemorrhage, mass lesion, mass effect, midline shift or abnormal extra-axial fluid collection. There is no hydrocephalus or pneumocephalus.  Atherosclerotic vascular disease is noted.  There is some cortical atrophy.  IMPRESSION: Atrophy and chronic microvascular ischemic change.  No acute abnormality.  Original Report Authenticated By: Bernadene Bell. Maricela Curet, M.D.   Ct Chest W Contrast  09/17/2011  *RADIOLOGY REPORT*  Clinical Data: Abnormal chest x-ray.  Stroke  CT CHEST WITH CONTRAST  Technique:  Multidetector CT imaging of the chest was performed following the standard protocol during bolus administration of intravenous contrast.  Contrast: 80mL OMNIPAQUE IOHEXOL 300 MG/ML IV SOLN  Comparison: Chest x-ray 09/17/2011  Findings: Left upper lobe infiltrative type density measures 3 x 4 cm and contains some air bronchograms.  This corresponds to the chest x-ray density.  This may represent pneumonia however neoplasm such as bronchoalveolar carcinoma could have this appearance. Close radiographic follow up is  suggested.  5 mm right upper lobe nodule just above the minor fissure is noted. 4 mm right lower lobe nodule laterally also noted.  These are not calcified.  Bibasilar atelectasis is present.  Chronic lung disease is present. No pleural effusion is present.  Negative for mediastinal adenopathy.  Coronary artery calcification is present.  IMPRESSION: 3 x 4 cm infiltrative density left upper lobe.  This may represent pneumonia versus neoplasm.  Radiographic followup is suggested to evaluate for  clearing.  Two nodules in the right lung are present which could be due to granulomata or metastatic disease.  Original Report Authenticated By: Camelia Phenes, M.D.   Dg Chest Portable 1 View  09/17/2011  *RADIOLOGY REPORT*  Clinical Data: Stroke, slurred speech  PORTABLE CHEST - 1 VIEW  Comparison: 06/13/2006  Findings: There is a poorly marginated new airspace opacity peripherally in the mid left lung.  Right lung remains clear with somewhat prominent interstitial markings.  No effusion.  Heart size upper limits normal.  Mild thoracic spine degenerative change.  IMPRESSION:  1.  Ill-defined peripheral airspace opacity in the left midlung, new since previous exam.  Original Report Authenticated By: Osa Craver, M.D.      Medications: I have reviewed the patient's current medications.  Impression: 1. CVA. 2. 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 She is never been a smoker but the possibility of malignancy still exists. 3. Hypertension, controlled. 4. Hyperlipidemia.     Plan: 1. Await MRI of the brain. This will be done tomorrow. She will need intravenous Ativan prior to the test as she is very claustrophobic. 2. Tomorrow I will ask pulmonology to see this patient to see if she warrants bronchoscopy. 3. LDL cholesterol elevated. Start simvastatin.     LOS: 1 day   GOSRANI,NIMISH C 09/18/2011, 9:43 AM

## 2011-09-19 ENCOUNTER — Other Ambulatory Visit (HOSPITAL_COMMUNITY): Payer: Self-pay

## 2011-09-19 ENCOUNTER — Inpatient Hospital Stay (HOSPITAL_COMMUNITY): Payer: Medicare PPO

## 2011-09-19 LAB — BASIC METABOLIC PANEL
Chloride: 105 mEq/L (ref 96–112)
GFR calc Af Amer: 59 mL/min — ABNORMAL LOW (ref >90)
GFR calc non Af Amer: 51 mL/min — ABNORMAL LOW (ref >90)
Potassium: 3.8 mEq/L (ref 3.5–5.1)
Sodium: 139 mEq/L (ref 135–145)

## 2011-09-19 LAB — CBC
Platelets: 151 10*3/uL (ref 150–400)
RBC: 3.85 MIL/uL — ABNORMAL LOW (ref 3.87–5.11)
WBC: 3.8 10*3/uL — ABNORMAL LOW (ref 4.0–10.5)

## 2011-09-19 LAB — TSH: TSH: 5.249 u[IU]/mL — ABNORMAL HIGH (ref 0.350–4.500)

## 2011-09-19 LAB — HEMOGLOBIN A1C: Mean Plasma Glucose: 117 mg/dL — ABNORMAL HIGH (ref <117)

## 2011-09-19 MED ORDER — SIMVASTATIN 20 MG PO TABS: 20.0000 mg | ORAL_TABLET | Freq: Every day | ORAL | Status: DC

## 2011-09-19 MED ORDER — GADOBENATE DIMEGLUMINE 529 MG/ML IV SOLN
15.0000 mL | Freq: Once | INTRAVENOUS | Status: AC | PRN
  Administered 2011-09-19: 15 mL via INTRAVENOUS

## 2011-09-19 MED ORDER — ATENOLOL 50 MG PO TABS: 50.0000 mg | ORAL_TABLET | Freq: Every day | ORAL | Status: DC

## 2011-09-19 MED ORDER — LISINOPRIL 5 MG PO TABS: 5.0000 mg | ORAL_TABLET | Freq: Every day | ORAL | Status: DC

## 2011-09-19 MED ORDER — SODIUM CHLORIDE 0.9 % IJ SOLN
INTRAMUSCULAR | Status: AC
  Administered 2011-09-19: 3 mL
  Filled 2011-09-19: qty 3

## 2011-09-19 MED ORDER — ASPIRIN 325 MG PO TABS: 325.0000 mg | ORAL_TABLET | Freq: Every day | ORAL | Status: DC

## 2011-09-19 MED ORDER — LISINOPRIL 5 MG PO TABS
5.0000 mg | ORAL_TABLET | Freq: Every day | ORAL | Status: DC
  Administered 2011-09-19: 5 mg via ORAL
  Filled 2011-09-19: qty 1

## 2011-09-19 NOTE — Progress Notes (Signed)
*  PRELIMINARY RESULTS* Echocardiogram 2D Echocardiogram has been performed.  Stephanie [name] 09/19/2011, 11:30 AM

## 2011-09-19 NOTE — Progress Notes (Signed)
Speech Language/Pathology Speech Language Pathology  Patient Details Name: ARDA DAGGS MRN: [id number] DOB: [birth year] Today's Date: 09/19/2011  Consult received for "SLP eval and treat". Chart reviewed. Attempted to see patient, however she is not in her room and is currently in MRI. I spoke with her nurse, Morrie Sheldon, who reports that she passed the RN swallow screen (unable to locate in computer) and has been tolerating a regular diet without incident.   Unable to complete cognitive linguistic evaluation at this time due to above. SLP will attempt tomorrow if needed.  Thank you, Havery Moros, CCC-SLP [phone]  Andry Bogden 09/19/2011, 2:28 PM

## 2011-09-19 NOTE — Progress Notes (Signed)
09/19/11 1800 patient discharged home with daughters this evening. Reviewed discharge instructions with patient and daughters at bedside, given copy of instructions, med list, prescriptions, work note, f/u appointment information, verbalized understanding. Denied pain or discomfort prior to discharge. IV site d/c'd and within norma limits. Patient left floor in stable condition via w/c accompanied by nurse.

## 2011-09-19 NOTE — Progress Notes (Signed)
[facility] SURGICAL UNIT [address] [place] [address]  September 19, 2011  Patient: Stephanie [name]  Date of Birth: 1941-09-18  Date of Visit: 09/17/2011    To Whom It May Concern:  Stephanie [name] was seen and treated in our Hospital  on 09/17/2011 until 09/19/2011. [name]  Can return to work on a reduced schedule on 10/03/11.  Sincerely,

## 2011-09-19 NOTE — Progress Notes (Signed)
09/19/11 1545 Results for MRI called to nurse about 1515, paged Dr Karilyn Cota with results. Stated would be up this afternoon to speak with patient and family. Nursing to monitor.

## 2011-09-19 NOTE — Discharge Summary (Addendum)
Physician Discharge Summary  Patient ID: DESTANE SPEAS MRN: [id number] DOB/AGE: 12-15-1940 70 y.o. Primary Care Physician:Dr Phillips Odor Admit date: 09/17/2011 Discharge date: 09/19/2011    Discharge Diagnoses:  1. 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 2. Left upper lobe density 3 x 4 cm, unclear etiology. Requires followup. 3. Hypertension. 4. Hyperlipidemia. 5. Borderline hypothyroidism.   Current Discharge Medication List    START taking these medications   Details  aspirin 325 MG tablet Take 1 tablet (325 mg total) by mouth daily. Qty: 30 tablet, Refills: 0    lisinopril (PRINIVIL,ZESTRIL) 5 MG tablet Take 1 tablet (5 mg total) by mouth daily. Qty: 30 tablet, Refills: 0    simvastatin (ZOCOR) 20 MG tablet Take 1 tablet (20 mg total) by mouth daily at 6 PM. Qty: 30 tablet, Refills: 0      CONTINUE these medications which have CHANGED   Details  atenolol (TENORMIN) 50 MG tablet Take 1 tablet (50 mg total) by mouth daily. Qty: 30 tablet, Refills: 0      STOP taking these medications     aspirin EC 81 MG tablet         Discharged Condition: Stable and improved.    Consults: None.  Significant Diagnostic Studies: Ct Head Wo Contrast  09/17/2011  *RADIOLOGY REPORT*  Clinical Data: Acute onset of abnormal speech pattern. Tongue deviation.  CT HEAD WITHOUT CONTRAST  Technique:  Contiguous axial images were obtained from the base of the skull through the vertex without contrast.  Comparison: None.  Findings: Chronic microvascular ischemic change is noted.  No evidence of acute infarction, hemorrhage, mass lesion, mass effect, midline shift or abnormal extra-axial fluid collection. There is no hydrocephalus or pneumocephalus.  Atherosclerotic vascular disease is noted.  There is some cortical atrophy.  IMPRESSION: Atrophy and chronic microvascular ischemic change.  No acute abnormality.  Original Report Authenticated By:  Bernadene Bell. Maricela Curet, M.D.   Ct Chest W Contrast  09/17/2011  *RADIOLOGY REPORT*  Clinical Data: Abnormal chest x-ray.  Stroke  CT CHEST WITH CONTRAST  Technique:  Multidetector CT imaging of the chest was performed following the standard protocol during bolus administration of intravenous contrast.  Contrast: 80mL OMNIPAQUE IOHEXOL 300 MG/ML IV SOLN  Comparison: Chest x-ray 09/17/2011  Findings: Left upper lobe infiltrative type density measures 3 x 4 cm and contains some air bronchograms.  This corresponds to the chest x-ray density.  This may represent pneumonia however neoplasm such as bronchoalveolar carcinoma could have this appearance. Close radiographic follow up is suggested.  5 mm right upper lobe nodule just above the minor fissure is noted. 4 mm right lower lobe nodule laterally also noted.  These are not calcified.  Bibasilar atelectasis is present.  Chronic lung disease is present. No pleural effusion is present.  Negative for mediastinal adenopathy.  Coronary artery calcification is present.  IMPRESSION: 3 x 4 cm infiltrative density left upper lobe.  This may represent pneumonia versus neoplasm.  Radiographic followup is suggested to evaluate for clearing.  Two nodules in the right lung are present which could be due to granulomata or metastatic disease.  Original Report Authenticated By: Camelia Phenes, M.D.   Mr Laqueta Jean Wo Contrast  09/19/2011  *RADIOLOGY REPORT*  Clinical Data: Abnormal speech.  MRI HEAD WITHOUT AND WITH CONTRAST  Technique:  Multiplanar, multiecho pulse sequences of the brain and surrounding structures were obtained according to standard protocol without and with intravenous contrast  Contrast: 15mL  MULTIHANCE GADOBENATE DIMEGLUMINE 529 MG/ML IV SOLN  Comparison: 09/17/2011 CT.  No comparison MR.  Findings: Acute non hemorrhagic small infarct extends through the posterior left lenticular nucleus into the posterior left corona radiata.  Remote right caudate - superior  lenticular of this infarct.  Moderate small vessel disease type changes.  No intracranial hemorrhage.  No intracranial mass or abnormal enhancement.  Global atrophy without hydrocephalus.  Degenerative changes C1-2 articulation with mild transverse ligament hypertrophy.  Major intracranial vascular structures are patent.  Minimal paranasal sinus mucosal thickening.  IMPRESSION: Acute non hemorrhagic small infarct extends through the posterior left lenticular nucleus into the posterior left corona radiata.  Remote right caudate - superior lenticular of this infarct.  Moderate small vessel disease type changes.  Please see above.  Original Report Authenticated By: Fuller Canada, M.D.   US Carotid Duplex Bilateral  09/18/2011  *RADIOLOGY REPORT*  Clinical Data: Stroke.   carotid plaque. Hypertension.  BILATERAL CAROTID DUPLEX ULTRASOUND  Technique: Wallace Cullens scale imaging, color Doppler and duplex ultrasound was performed of bilateral carotid and vertebral arteries in the neck.  Comparison: 07/25/2003 by report only  Criteria:  Quantification of carotid stenosis is based on velocity parameters that correlate the residual internal carotid diameter with NASCET-based stenosis levels, using the diameter of the distal internal carotid lumen as the denominator for stenosis measurement.  The following velocity measurements were obtained:                   PEAK SYSTOLIC/END DIASTOLIC RIGHT ICA:                        94/22cm/sec CCA:                        73/10cm/sec SYSTOLIC ICA/CCA RATIO:     1.28 DIASTOLIC ICA/CCA RATIO:    2.17 ECA:                        62cm/sec  LEFT ICA:                        82/19cm/sec CCA:                        70/12cm/sec SYSTOLIC ICA/CCA RATIO:     1.17 DIASTOLIC ICA/CCA RATIO:    1.55 ECA:                        156cm/sec  Findings:  RIGHT CAROTID ARTERY: There is eccentric partially calcified plaque in the carotid bulb extending into proximal internal and external carotid arteries without  high-grade stenosis.  Normal wave forms and color Doppler signal.  RIGHT VERTEBRAL ARTERY:  Normal flow direction and waveform.  LEFT CAROTID ARTERY: Eccentric plaque in the mid and distal common carotid artery.  Partially calcified irregular plaque circumferentially in the carotid bulb extending to proximal internal and external carotid arteries with at least mild stenosis. Normal wave forms and color Doppler signal however.  LEFT VERTEBRAL ARTERY:  Normal flow direction and waveform.  IMPRESSION:  1.  Bilateral partially calcified carotid bifurcation and proximal ICA plaque resulting in less than 50% diameter stenosis. The exam does not exclude plaque ulceration or embolization.  Continued surveillance recommended.  Original Report Authenticated By: Osa Craver, M.D.   Dg Chest Portable 1 View  09/17/2011  *RADIOLOGY REPORT*  Clinical Data: Stroke, slurred speech  PORTABLE CHEST - 1 VIEW  Comparison: 06/13/2006  Findings: There is a poorly marginated new airspace opacity peripherally in the mid left lung.  Right lung remains clear with somewhat prominent interstitial markings.  No effusion.  Heart size upper limits normal.  Mild thoracic spine degenerative change.  IMPRESSION:  1.  Ill-defined peripheral airspace opacity in the left midlung, new since previous exam.  Original Report Authenticated By: Osa Craver, M.D.  Name  MRN   Description     ASHELEY HELLBERG  [id number]   70 year old Female              Result Narrative     *[facility]* [address] [place], [place] [address] [phone]  ------------------------------------------------------------ Transthoracic Echocardiography  Patient: Kyri, Shader MR #: [id number] Study Date: 09/19/2011 Gender: F Age: 63 Height: 170.2cm Weight: 83.5kg BSA: 1.87m^2 Pt. Status: Room: A309  SONOGRAPHER [facility] ADMITTING [place], Nimish C ORDERING Industry, Nimish C REFERRING [place], Nimish C ATTENDING  Lewis, Scott PERFORMING Angelica Ran Penn cc:  ------------------------------------------------------------ LV EF: 55% - 60%  ------------------------------------------------------------ Indications: CVA 436.  ------------------------------------------------------------ History: PMH: No prior cardiac history. Risk factors: Hypertension.  ------------------------------------------------------------ Study Conclusions  - Procedure narrative: Transthoracic echocardiography. Image quality was poor, possibly due to technical difficulty. - Left ventricle: The cavity size was normal. There was severe concentric hypertrophy. Systolic function was normal. The estimated ejection fraction was in the range of 55% to 60%. Doppler parameters are consistent with abnormal left ventricular relaxation (grade 1 diastolic dysfunction). The E/e' ratio is >10, suggesting elevated LV filing pressure. - Aortic valve: Sclerosis without stenosis. - Mitral valve: Calcified annulus. Mildly thickened leaflets . - Left atrium: The atrium was mildly dilated. Transthoracic echocardiography. M-mode, complete 2D, spectral Doppler, and color Doppler. Height: Height: 170.2cm. Height: 67in. Weight: Weight: 83.5kg. Weight: 183.6lb. Body mass index: BMI: 28.8kg/m^2. Body surface area: BSA: 1.45m^2. Patient status: Inpatient. Location: Bedside.      Lab Results: Basic Metabolic Panel:  Basename 09/19/11 0519 09/18/11 0640  NA 139 135  K 3.8 4.7  CL 105 102  CO2 28 29  GLUCOSE 104* 104*  BUN 16 16  CREATININE 1.09 1.11*  CALCIUM 9.3 9.7  MG -- --  PHOS -- --   Liver Function Tests:  Basename 09/17/11 1010  AST 21  ALT 13  ALKPHOS 85  BILITOT 0.7  PROT 7.9  ALBUMIN 4.1     CBC:  Basename 09/19/11 0519 09/17/11 1010  WBC 3.8* 7.5  NEUTROABS -- --  HGB 12.7 14.9  HCT 37.2 43.9  MCV 96.6 95.6  PLT 151 218   TSH 5.249 Total cholesterol 207, LDL cholesterol 130, HDL cholesterol 56.     Hospital Course: This very pleasant 70 year old lady was admitted with history of abnormal speech and right arm weakness. Please see initial history and physical examination. It was felt clinically that she had had a stroke affecting the left brain. Initial CT brain scan was unremarkable. An MRI of the brain showed small infarcts affecting the left brain as outlined below. 2-D echocardiogram was unremarkable. Bilateral carotid ultrasound Dopplers also unremarkable. On admission the chest x-ray was done and this was abnormal for an abnormality in the left upper lobe. This was confirmed with a CT chest scan where a left upper lobe density was seen measuring 3 x 4 cm. The differential diagnosis included infection versus neoplasm. The patient did not have any symptoms of  pneumonia. The patient is a nonsmoker. She does not have any previous history of malignancy. During the hospitalization the patient improved rather rapidly with normalization almost of her speech and improvement in the right arm weakness. Her gait is normal. She was found to have hyperlipidemia with LDL cholesterol above 100 and a statin was started. Also her TSH was elevated at 5.249. This will need to be repeated in a few weeks time. Her hemoglobin A1c was 5.7%.  Discharge Exam: Blood pressure 124/73, pulse 50, temperature 97.4 F (36.3 C), temperature source Oral, resp. rate 20, height 5\' 7"  (1.702 m), weight 83.8 kg (184 lb 11.9 oz), SpO2 99.00%. She is doing well today. She is alert and orientated. Her speech is somewhat still dysarthric but much improved since she was admitted. She has no limb weakness. Her gait is normal. Heart sounds are present and normal without murmurs. Lung fields are clear.  Disposition: Home. She will need followup with pulmonology, Dr. Juanetta Gosling to review the left upper lobe density. She will also need followup for her hypertension, hyperlipidemia and abnormal thyroid tests by her primary care  physician.  Discharge Orders    Future Orders Please Complete By Expires   Diet - low sodium heart healthy      Increase activity slowly      Discharge instructions      Comments:   No driving until this is approved by your family doctor.      Follow-up Information    Follow up with GOLDING,JOHN CABOT. Make an appointment in 1 week.   Contact information:   [address] [place] [address] [place] [address] [phone]       Follow up with HAWKINS,EDWARD L. Make an appointment in 1 week.   Contact information:   [address] [place] [address] [phone]          Signed: Wilson Singer 09/19/2011, 4:37 PM

## 2011-09-19 NOTE — Progress Notes (Signed)
Subjective: This lady is improved in particular her speech appears to be almost normal. The strength in her right arm also is better. Unfortunately, the CT chest scan raises the possibility of malignancy. Please see below. She is due to have an MRI of her brain today.           Physical Exam: Blood pressure 161/80, pulse 55, temperature 97.6 F (36.4 C), temperature source Oral, resp. rate 20, height 5\' 7"  (1.702 m), weight 83.8 kg (184 lb 11.9 oz), SpO2 97.00%. She looks systemically well. Her dysarthria is less. Indeed, the weakness in the right arm is much improved and she is now normal strength compared to the left arm. Otherwise there are no new physical examination findings.   Investigations: Results for orders placed during the hospital encounter of 09/17/11 (from the past 48 hour(s))  POCT I-STAT TROPONIN I     Status: Normal   Collection Time   09/17/11 10:02 AM      Component Value Range Comment   Troponin i, poc 0.01  0.00 - 0.08 (ng/mL)    Comment 3            CBC     Status: Normal   Collection Time   09/17/11 10:10 AM      Component Value Range Comment   WBC 7.5  4.0 - 10.5 (K/uL)    RBC 4.59  3.87 - 5.11 (MIL/uL)    Hemoglobin 14.9  12.0 - 15.0 (g/dL)    HCT 95.6  21.3 - 08.6 (%)    MCV 95.6  78.0 - 100.0 (fL)    MCH 32.5  26.0 - 34.0 (pg)    MCHC 33.9  30.0 - 36.0 (g/dL)    RDW 57.8  46.9 - 62.9 (%)    Platelets 218  150 - 400 (K/uL)   COMPREHENSIVE METABOLIC PANEL     Status: Abnormal   Collection Time   09/17/11 10:10 AM      Component Value Range Comment   Sodium 137  135 - 145 (mEq/L)    Potassium 4.2  3.5 - 5.1 (mEq/L)    Chloride 102  96 - 112 (mEq/L)    CO2 28  19 - 32 (mEq/L)    Glucose, Bld 132 (*) 70 - 99 (mg/dL)    BUN 18  6 - 23 (mg/dL)    Creatinine, Ser 5.28 (*) 0.50 - 1.10 (mg/dL)    Calcium 41.3  8.4 - 10.5 (mg/dL)    Total Protein 7.9  6.0 - 8.3 (g/dL)    Albumin 4.1  3.5 - 5.2 (g/dL)    AST 21  0 - 37 (U/L)    ALT 13  0 - 35  (U/L)    Alkaline Phosphatase 85  39 - 117 (U/L)    Total Bilirubin 0.7  0.3 - 1.2 (mg/dL)    GFR calc non Af Amer 48 (*) >90 (mL/min)    GFR calc Af Amer 56 (*) >90 (mL/min)   PROTIME-INR     Status: Normal   Collection Time   09/17/11 10:10 AM      Component Value Range Comment   Prothrombin Time 13.7  11.6 - 15.2 (seconds)    INR 1.03  0.00 - 1.49    URINALYSIS, ROUTINE W REFLEX MICROSCOPIC     Status: Abnormal   Collection Time   09/17/11  1:01 PM      Component Value Range Comment   Color, Urine YELLOW  YELLOW  Appearance CLEAR  CLEAR     Specific Gravity, Urine 1.025  1.005 - 1.030     pH 6.0  5.0 - 8.0     Glucose, UA NEGATIVE  NEGATIVE (mg/dL)    Hgb urine dipstick SMALL (*) NEGATIVE     Bilirubin Urine NEGATIVE  NEGATIVE     Ketones, ur NEGATIVE  NEGATIVE (mg/dL)    Protein, ur 30 (*) NEGATIVE (mg/dL)    Urobilinogen, UA 0.2  0.0 - 1.0 (mg/dL)    Nitrite NEGATIVE  NEGATIVE     Leukocytes, UA NEGATIVE  NEGATIVE    URINE MICROSCOPIC-ADD ON     Status: Abnormal   Collection Time   09/17/11  1:01 PM      Component Value Range Comment   Squamous Epithelial / LPF FEW (*) RARE     WBC, UA 3-6  <3 (WBC/hpf)    RBC / HPF 3-6  <3 (RBC/hpf)    Bacteria, UA FEW (*) RARE    HEMOGLOBIN A1C     Status: Abnormal   Collection Time   09/18/11  6:40 AM      Component Value Range Comment   Hemoglobin A1C 5.7 (*) <5.7 (%)    Mean Plasma Glucose 117 (*) <117 (mg/dL)   LIPID PANEL     Status: Abnormal   Collection Time   09/18/11  6:40 AM      Component Value Range Comment   Cholesterol 207 (*) 0 - 200 (mg/dL)    Triglycerides 409  <150 (mg/dL)    HDL 56  >81 (mg/dL)    Total CHOL/HDL Ratio 3.7      VLDL 21  0 - 40 (mg/dL)    LDL Cholesterol 191 (*) 0 - 99 (mg/dL)   BASIC METABOLIC PANEL     Status: Abnormal   Collection Time   09/18/11  6:40 AM      Component Value Range Comment   Sodium 135  135 - 145 (mEq/L)    Potassium 4.7  3.5 - 5.1 (mEq/L)    Chloride 102  96 -  112 (mEq/L)    CO2 29  19 - 32 (mEq/L)    Glucose, Bld 104 (*) 70 - 99 (mg/dL)    BUN 16  6 - 23 (mg/dL)    Creatinine, Ser 4.78 (*) 0.50 - 1.10 (mg/dL)    Calcium 9.7  8.4 - 10.5 (mg/dL)    GFR calc non Af Amer 49 (*) >90 (mL/min)    GFR calc Af Amer 57 (*) >90 (mL/min)   CBC     Status: Abnormal   Collection Time   09/19/11  5:19 AM      Component Value Range Comment   WBC 3.8 (*) 4.0 - 10.5 (K/uL)    RBC 3.85 (*) 3.87 - 5.11 (MIL/uL)    Hemoglobin 12.7  12.0 - 15.0 (g/dL)    HCT 29.5  62.1 - 30.8 (%)    MCV 96.6  78.0 - 100.0 (fL)    MCH 33.0  26.0 - 34.0 (pg)    MCHC 34.1  30.0 - 36.0 (g/dL)    RDW 65.7  84.6 - 96.2 (%)    Platelets 151  150 - 400 (K/uL) DELTA CHECK NOTED  BASIC METABOLIC PANEL     Status: Abnormal   Collection Time   09/19/11  5:19 AM      Component Value Range Comment   Sodium 139  135 - 145 (mEq/L)    Potassium 3.8  3.5 -  5.1 (mEq/L) DELTA CHECK NOTED   Chloride 105  96 - 112 (mEq/L)    CO2 28  19 - 32 (mEq/L)    Glucose, Bld 104 (*) 70 - 99 (mg/dL)    BUN 16  6 - 23 (mg/dL)    Creatinine, Ser 4.54  0.50 - 1.10 (mg/dL)    Calcium 9.3  8.4 - 10.5 (mg/dL)    GFR calc non Af Amer 51 (*) >90 (mL/min)    GFR calc Af Amer 59 (*) >90 (mL/min)      Ct Head Wo Contrast  09/17/2011  *RADIOLOGY REPORT*  Clinical Data: Acute onset of abnormal speech pattern. Tongue deviation.  CT HEAD WITHOUT CONTRAST  Technique:  Contiguous axial images were obtained from the base of the skull through the vertex without contrast.  Comparison: None.  Findings: Chronic microvascular ischemic change is noted.  No evidence of acute infarction, hemorrhage, mass lesion, mass effect, midline shift or abnormal extra-axial fluid collection. There is no hydrocephalus or pneumocephalus.  Atherosclerotic vascular disease is noted.  There is some cortical atrophy.  IMPRESSION: Atrophy and chronic microvascular ischemic change.  No acute abnormality.  Original Report Authenticated By: Bernadene Bell.  Maricela Curet, M.D.   Ct Chest W Contrast  09/17/2011  *RADIOLOGY REPORT*  Clinical Data: Abnormal chest x-ray.  Stroke  CT CHEST WITH CONTRAST  Technique:  Multidetector CT imaging of the chest was performed following the standard protocol during bolus administration of intravenous contrast.  Contrast: 80mL OMNIPAQUE IOHEXOL 300 MG/ML IV SOLN  Comparison: Chest x-ray 09/17/2011  Findings: Left upper lobe infiltrative type density measures 3 x 4 cm and contains some air bronchograms.  This corresponds to the chest x-ray density.  This may represent pneumonia however neoplasm such as bronchoalveolar carcinoma could have this appearance. Close radiographic follow up is suggested.  5 mm right upper lobe nodule just above the minor fissure is noted. 4 mm right lower lobe nodule laterally also noted.  These are not calcified.  Bibasilar atelectasis is present.  Chronic lung disease is present. No pleural effusion is present.  Negative for mediastinal adenopathy.  Coronary artery calcification is present.  IMPRESSION: 3 x 4 cm infiltrative density left upper lobe.  This may represent pneumonia versus neoplasm.  Radiographic followup is suggested to evaluate for clearing.  Two nodules in the right lung are present which could be due to granulomata or metastatic disease.  Original Report Authenticated By: Camelia Phenes, M.D.   Dg Chest Portable 1 View  09/17/2011  *RADIOLOGY REPORT*  Clinical Data: Stroke, slurred speech  PORTABLE CHEST - 1 VIEW  Comparison: 06/13/2006  Findings: There is a poorly marginated new airspace opacity peripherally in the mid left lung.  Right lung remains clear with somewhat prominent interstitial markings.  No effusion.  Heart size upper limits normal.  Mild thoracic spine degenerative change.  IMPRESSION:  1.  Ill-defined peripheral airspace opacity in the left midlung, new since previous exam.  Original Report Authenticated By: Osa Craver, M.D.      Medications: I have  reviewed the patient's current medications.  Impression: 1. CVA. 2. 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 She is never been a smoker but the possibility of malignancy still exists. 3. Hypertension, controlled. 4. Hyperlipidemia.     Plan: 1. Await MRI of the brain. This will be done today. She will need intravenous Ativan prior to the test as she is very claustrophobic. 2. Tomorrow I will ask pulmonology  to see this patient to see if she warrants bronchoscopy. 3. LDL cholesterol elevated. Start simvastatin.     LOS: 2 days   Delenn Ahn C 09/19/2011, 8:47 AM

## 2011-09-29 ENCOUNTER — Other Ambulatory Visit (HOSPITAL_COMMUNITY): Payer: Self-pay | Admitting: Pulmonary Disease

## 2011-09-29 DIAGNOSIS — R918 Other nonspecific abnormal finding of lung field: Secondary | ICD-10-CM

## 2011-10-07 ENCOUNTER — Other Ambulatory Visit (HOSPITAL_COMMUNITY): Payer: Medicare PPO

## 2011-10-07 ENCOUNTER — Other Ambulatory Visit (HOSPITAL_COMMUNITY): Payer: Self-pay | Admitting: Pulmonary Disease

## 2011-10-07 DIAGNOSIS — R911 Solitary pulmonary nodule: Secondary | ICD-10-CM

## 2011-10-11 ENCOUNTER — Other Ambulatory Visit: Payer: Self-pay | Admitting: Radiology

## 2011-10-13 ENCOUNTER — Other Ambulatory Visit: Payer: Self-pay | Admitting: Interventional Radiology

## 2011-10-13 ENCOUNTER — Ambulatory Visit (HOSPITAL_COMMUNITY)
Admission: RE | Admit: 2011-10-13 | Discharge: 2011-10-13 | Disposition: A | Discharge status: Home / Self Care | Payer: Medicare PPO | Source: Ambulatory Visit | Attending: Pulmonary Disease | Admitting: Pulmonary Disease

## 2011-10-13 DIAGNOSIS — R911 Solitary pulmonary nodule: Secondary | ICD-10-CM

## 2011-10-13 HISTORY — PX: LUNG BIOPSY: SHX232

## 2011-10-13 LAB — CBC
HCT: 37.5 % (ref 36.0–46.0)
Platelets: 130 10*3/uL — ABNORMAL LOW (ref 150–400)
RDW: 12.2 % (ref 11.5–15.5)
WBC: 4.4 10*3/uL (ref 4.0–10.5)

## 2011-10-13 LAB — PROTIME-INR
INR: 1.13 (ref 0.00–1.49)
Prothrombin Time: 14.7 seconds (ref 11.6–15.2)

## 2011-10-13 LAB — APTT: aPTT: 36 seconds (ref 24–37)

## 2011-10-13 MED ORDER — SODIUM CHLORIDE 0.9 % IV SOLN
INTRAVENOUS | Status: AC | PRN
  Administered 2011-10-13: 50 mL/h via INTRAVENOUS

## 2011-10-13 MED ORDER — FENTANYL CITRATE 0.05 MG/ML IJ SOLN
INTRAMUSCULAR | Status: AC
  Filled 2011-10-13: qty 4

## 2011-10-13 MED ORDER — SODIUM CHLORIDE 0.9 % IV SOLN
Freq: Once | INTRAVENOUS | Status: AC
  Administered 2011-10-13: 09:00:00 via INTRAVENOUS

## 2011-10-13 MED ORDER — MIDAZOLAM HCL 2 MG/2ML IJ SOLN
INTRAMUSCULAR | Status: AC
  Filled 2011-10-13: qty 4

## 2011-10-13 MED ORDER — MIDAZOLAM HCL 5 MG/5ML IJ SOLN
INTRAMUSCULAR | Status: AC | PRN
  Administered 2011-10-13: 0.5 mg via INTRAVENOUS
  Administered 2011-10-13: 1 mg via INTRAVENOUS

## 2011-10-13 MED ORDER — FENTANYL CITRATE 0.05 MG/ML IJ SOLN
INTRAMUSCULAR | Status: AC | PRN
  Administered 2011-10-13: 50 ug via INTRAVENOUS

## 2011-10-13 NOTE — ED Notes (Signed)
O2 to 2l [place].

## 2011-10-13 NOTE — ED Notes (Signed)
Dr Miles Costain updated on VS and that patient no longer coughing

## 2011-10-13 NOTE — ED Notes (Signed)
Dressing left chest lateral clean dry and intact. No coughing. Report to [facility]

## 2011-10-13 NOTE — Procedures (Signed)
Successful CT fluoro LUL indeterminate opacity FNA and core bx No immed comp Stable Path pending

## 2011-10-13 NOTE — ED Notes (Signed)
Dr. Miles Costain has gone to pathology to check specimens.

## 2011-10-13 NOTE — H&P (Signed)
Stephanie [name] is an 70 y.o. female.   Chief Complaint: indeterminate LUL opacity HPI: 70yo female with a small recent CVA, CXR showed LUL opacity which was then CT'd showing an indeterminate mass like opacity.    Past Medical History  Diagnosis Date  . Hypertension   . Arthritis     Past Surgical History  Procedure Date  . No past surgeries     Family History  Problem Relation Age of Onset  . Stroke Mother   . Stroke Sister    Social History:  reports that she has never smoked. She has never used smokeless tobacco. She reports that she does not drink alcohol or use illicit drugs.  Allergies: No Known Allergies  Medications Prior to Admission  Medication Sig Dispense Refill  . aspirin 325 MG tablet Take 1 tablet (325 mg total) by mouth daily.  30 tablet  0  . atenolol (TENORMIN) 50 MG tablet Take 1 tablet (50 mg total) by mouth daily.  30 tablet  0  . lisinopril (PRINIVIL,ZESTRIL) 5 MG tablet Take 1 tablet (5 mg total) by mouth daily.  30 tablet  0  . simvastatin (ZOCOR) 20 MG tablet Take 1 tablet (20 mg total) by mouth daily at 6 PM.  30 tablet  0   Medications Prior to Admission  Medication Dose Route Frequency Provider Last Rate Last Dose  . 0.9 %  sodium chloride infusion   Intravenous Once Stephanie Leu, PA 20 mL/hr at 10/13/11 0901      Results for orders placed during the hospital encounter of 10/13/11 (from the past 48 hour(s))  CBC     Status: Abnormal   Collection Time   10/13/11  8:47 AM      Component Value Range Comment   WBC 4.4  4.0 - 10.5 (K/uL)    RBC 3.96  3.87 - 5.11 (MIL/uL)    Hemoglobin 13.0  12.0 - 15.0 (g/dL)    HCT 08.6  57.8 - 46.9 (%)    MCV 94.7  78.0 - 100.0 (fL)    MCH 32.8  26.0 - 34.0 (pg)    MCHC 34.7  30.0 - 36.0 (g/dL)    RDW 62.9  52.8 - 41.3 (%)    Platelets 130 (*) 150 - 400 (K/uL)    No results found.  Review of Systems  Respiratory: Negative for cough and shortness of breath.   Cardiovascular: Negative for chest pain.    Gastrointestinal: Negative for abdominal pain.    Blood pressure 164/89, pulse 59, temperature 96.7 F (35.9 C), temperature source Oral, resp. rate 18, height 5\' 7"  (1.702 m), weight 175 lb (79.379 kg), SpO2 96.00%. Physical Exam  CV RRR Lungs  bialteral Wheezes abd NT ND +bowel sounds Neuro grossly intact  Assessment/Plan Indeterminate LUL opacity Plan for CT BX consent obtained    Stephanie [name] T. 10/13/2011, 9:17 AM

## 2011-10-13 NOTE — ED Notes (Signed)
dr 

## 2011-10-13 NOTE — ED Notes (Signed)
To nurses station. fio2 ra is 97%

## 2011-10-13 NOTE — ED Notes (Signed)
o2 decreased 3l [place]. Coughing stopped

## 2011-10-13 NOTE — ED Notes (Addendum)
spo2 decreased 86% Dr Miles Costain aware. O2 increased to 5L [place].and spo2 increased to 93%. Patient is coughing. No secretions

## 2011-10-14 ENCOUNTER — Inpatient Hospital Stay (HOSPITAL_COMMUNITY)
Admission: EM | Admit: 2011-10-14 | Discharge: 2011-10-19 | DRG: 200 | Disposition: A | Discharge status: Home / Self Care | Payer: Medicare PPO | Source: Ambulatory Visit | Attending: Pulmonary Disease | Admitting: Pulmonary Disease

## 2011-10-14 ENCOUNTER — Encounter (HOSPITAL_COMMUNITY): Payer: Self-pay | Admitting: Emergency Medicine

## 2011-10-14 ENCOUNTER — Emergency Department (HOSPITAL_COMMUNITY): Payer: Medicare PPO

## 2011-10-14 DIAGNOSIS — C3492 Malignant neoplasm of unspecified part of left bronchus or lung: Secondary | ICD-10-CM

## 2011-10-14 DIAGNOSIS — J95811 Postprocedural pneumothorax: Secondary | ICD-10-CM

## 2011-10-14 DIAGNOSIS — I10 Essential (primary) hypertension: Secondary | ICD-10-CM | POA: Diagnosis present

## 2011-10-14 DIAGNOSIS — E7800 Pure hypercholesterolemia, unspecified: Secondary | ICD-10-CM | POA: Diagnosis present

## 2011-10-14 DIAGNOSIS — C3490 Malignant neoplasm of unspecified part of unspecified bronchus or lung: Secondary | ICD-10-CM | POA: Diagnosis present

## 2011-10-14 DIAGNOSIS — R079 Chest pain, unspecified: Secondary | ICD-10-CM

## 2011-10-14 DIAGNOSIS — Z8673 Personal history of transient ischemic attack (TIA), and cerebral infarction without residual deficits: Secondary | ICD-10-CM

## 2011-10-14 HISTORY — DX: Cerebral infarction, unspecified: I63.9

## 2011-10-14 HISTORY — DX: Pure hypercholesterolemia, unspecified: E78.00

## 2011-10-14 LAB — BASIC METABOLIC PANEL
BUN: 14 mg/dL (ref 6–23)
BUN: 14 mg/dL (ref 6–23)
Chloride: 104 mEq/L (ref 96–112)
Chloride: 103 mEq/L (ref 96–112)
Creatinine, Ser: 1.08 mg/dL (ref 0.50–1.10)
GFR calc Af Amer: 65 mL/min — ABNORMAL LOW (ref >90)
GFR calc Af Amer: 59 mL/min — ABNORMAL LOW (ref >90)
GFR calc non Af Amer: 51 mL/min — ABNORMAL LOW (ref >90)
Potassium: 3.5 mEq/L (ref 3.5–5.1)
Potassium: 3.6 mEq/L (ref 3.5–5.1)

## 2011-10-14 LAB — MRSA PCR SCREENING: MRSA by PCR: NEGATIVE

## 2011-10-14 LAB — CBC
HCT: 39.9 % (ref 36.0–46.0)
Hemoglobin: 14 g/dL (ref 12.0–15.0)
RBC: 4.22 MIL/uL (ref 3.87–5.11)
RDW: 12.3 % (ref 11.5–15.5)
WBC: 7 10*3/uL (ref 4.0–10.5)

## 2011-10-14 LAB — PROTIME-INR
INR: 1.14 (ref 0.00–1.49)
INR: 1.18 (ref 0.00–1.49)
Prothrombin Time: 15.2 seconds (ref 11.6–15.2)

## 2011-10-14 MED ORDER — SIMVASTATIN 20 MG PO TABS
20.0000 mg | ORAL_TABLET | Freq: Every day | ORAL | Status: DC
  Administered 2011-10-15 – 2011-10-18 (×4): 20 mg via ORAL
  Filled 2011-10-14 (×5): qty 1

## 2011-10-14 MED ORDER — ATENOLOL 50 MG PO TABS
50.0000 mg | ORAL_TABLET | Freq: Every day | ORAL | Status: DC
  Administered 2011-10-15 – 2011-10-18 (×5): 50 mg via ORAL
  Filled 2011-10-14 (×7): qty 1

## 2011-10-14 MED ORDER — HYDROCODONE-ACETAMINOPHEN 5-325 MG PO TABS
1.0000 | ORAL_TABLET | Freq: Once | ORAL | Status: AC
  Administered 2011-10-14: 1 via ORAL
  Filled 2011-10-14: qty 1

## 2011-10-14 MED ORDER — LISINOPRIL 5 MG PO TABS
5.0000 mg | ORAL_TABLET | Freq: Every day | ORAL | Status: DC
  Administered 2011-10-15 – 2011-10-19 (×5): 5 mg via ORAL
  Filled 2011-10-14 (×9): qty 1

## 2011-10-14 MED ORDER — ASPIRIN 325 MG PO TABS
325.0000 mg | ORAL_TABLET | Freq: Every day | ORAL | Status: DC
  Administered 2011-10-15 – 2011-10-19 (×6): 325 mg via ORAL
  Filled 2011-10-14 (×6): qty 1

## 2011-10-14 MED ORDER — HYDROCODONE-ACETAMINOPHEN 5-325 MG PO TABS
1.0000 | ORAL_TABLET | ORAL | Status: DC | PRN
  Administered 2011-10-14 – 2011-10-15 (×5): 2 via ORAL
  Filled 2011-10-14: qty 2
  Filled 2011-10-14: qty 1
  Filled 2011-10-14 (×2): qty 2
  Filled 2011-10-14: qty 1
  Filled 2011-10-14: qty 2

## 2011-10-14 NOTE — ED Notes (Signed)
MD at bedside. 

## 2011-10-14 NOTE — H&P (Signed)
Name: Stephanie [name] MRN: [id number] DOB: [birth year]    LOS: 0  PCCM ADMISSION NOTE  History of Present Illness: 70 yr old female with PMH of HTN and hypercholesterolemia presenting to the hospital with CP.  The patient had a "minor" stroke during thanksgiving where a CXR was done and was questionable for a lung mass.  The patient herself is not a smoker but has been exposed to a lot of smoke in her lifetime.  CT guided biopsy was done on 12/13 that was not eventful then on 12/14 patient noticed back pain and SOB.  She was brought to the ED where a CXR revealed a 10% left sided PTX.  PCCM was called to admit.  Lines / Drains: PIV  Cultures: None  Antibiotics: None  Tests / Events: CXR on 12/14 with 10% L PTX.  Past Medical History  Diagnosis Date  . Hypertension   . Arthritis   . CVA (cerebral infarction)   . Stroke   . Hypercholesteremia    Past Surgical History  Procedure Date  . No past surgeries   . Lung biopsy 10/13/11    left   Prior to Admission medications   Medication Sig Start Date End Date Taking? Authorizing Provider  aspirin 325 MG tablet Take 1 tablet (325 mg total) by mouth daily. 09/19/11 09/18/12 Yes Nimish C Gosrani  atenolol (TENORMIN) 50 MG tablet Take 50 mg by mouth daily at 8 pm.     Yes Historical Provider, MD  lisinopril (PRINIVIL,ZESTRIL) 5 MG tablet Take 1 tablet (5 mg total) by mouth daily. 09/19/11 09/18/12 Yes Nimish C Gosrani  simvastatin (ZOCOR) 20 MG tablet Take 1 tablet (20 mg total) by mouth daily at 6 PM. 09/19/11 09/18/12 Yes Nimish C Gosrani    Allergies No Known Allergies  Family History Family History  Problem Relation Age of Onset  . Stroke Mother   . Stroke Sister     Social History  reports that she has never smoked. She has never used smokeless tobacco. She reports that she does not drink alcohol or use illicit drugs.  Review Of Systems  11 points review of systems is negative with an exception of listed in HPI.  Vital  Signs: Filed Vitals:   10/14/11 1626  BP: 153/73  Pulse: 60  Temp: 97.5 F (36.4 C)  Resp:    No intake or output data in the 24 hours ending 10/14/11 1805  Physical Examination: General:  Well appearing elderly female. Neuro:  Alert and oriented, moving all ext spontaneously.   HEENT:  /AT, PERRL, EOM-I and moist mucous membranes. Neck:  Supple, -LAN and -thyromegally.   Cardiovascular:  RRR, Nl S1/S2, -M/R/G. Lungs:  Decrease BS at the bases. Abdomen:  Soft, NT, ND and +BS. Musculoskeletal:  -edema and -tenderness. Skin:  Intact.  Labs and Imaging:   Labs: CBC    Component Value Date/Time   WBC 7.0 10/14/2011 1718   RBC 4.22 10/14/2011 1718   HGB 14.0 10/14/2011 1718   HCT 39.9 10/14/2011 1718   PLT 141* 10/14/2011 1718   MCV 94.5 10/14/2011 1718   MCH 33.2 10/14/2011 1718   MCHC 35.1 10/14/2011 1718   RDW 12.3 10/14/2011 1718    BMET    Component Value Date/Time   NA 139 09/19/2011 0519   K 3.8 09/19/2011 0519   CL 105 09/19/2011 0519   CO2 28 09/19/2011 0519   GLUCOSE 104* 09/19/2011 0519   BUN 16 09/19/2011 0519   CREATININE 1.09  09/19/2011 0519   CALCIUM 9.3 09/19/2011 0519   GFRNONAA 51* 09/19/2011 0519   GFRAA 59* 09/19/2011 0519    @cmet @ [facility] No results found for this basename: phart, pco2, pco2art, po2, po2art, hco3, tco2, acidbasedef, o2sat    No results found for this basename: MG in the last 168 hours Lab Results  Component Value Date   CALCIUM 9.3 09/19/2011    Assessment and Plan: 70 year old female with PMH of HTN and CVA presenting with a 10% PTX after CT guided biopsy of a lung mass.  The patient is not in distress and saturation of 100% on RA. Plan: 100% FiO2.  CXR in AM to evaluate the PTX, if worse would benefit from a pig tail catheter.  No CT or pig tail cath for now.  Resume HTN and hypercholesterolemia treatment.  IS per RT protocol.  Will F/U in AM.  Koren Bound, M.D. Pulmonary and Critical Care Medicine [facility] [phone]  10/14/2011, 6:05 PM

## 2011-10-14 NOTE — ED Notes (Signed)
Back pain x1 day following lung biopsy, pain described as sharp spasm, left upper back near bra-line

## 2011-10-14 NOTE — ED Notes (Signed)
Pt taken to x-ray via w/c.

## 2011-10-14 NOTE — ED Provider Notes (Signed)
History     CSN: [id number] Arrival date & time: 10/14/2011 12:56 PM   First MD Initiated Contact with Patient 10/14/11 1331      Chief Complaint  Patient presents with  . Back Pain    (Consider location/radiation/quality/duration/timing/severity/associated sxs/prior treatment) HPI Comments: Patient had a lung biopsy performed yesterday and is now here in the emergency department for left mid back pain.  She has some intermittent shortness of breath with this.  She's not on any pain medication since her procedure.  The biopsy was done for a lung mass.  No fevers.  No chest pain.  No drainage from the wound.  Pain is worst with certain movements.  Patient is a 70 y.o. female presenting with back pain. The history is provided by the patient. No language interpreter was used.  Back Pain  This is a new problem. The current episode started yesterday. The problem occurs constantly. The problem has not changed since onset.Pertinent negatives include no chest pain, no fever, no headaches, no abdominal pain and no dysuria.    Past Medical History  Diagnosis Date  . Hypertension   . Arthritis   . CVA (cerebral infarction)   . Stroke   . Hypercholesteremia     Past Surgical History  Procedure Date  . No past surgeries   . Lung biopsy 10/13/11    left    Family History  Problem Relation Age of Onset  . Stroke Mother   . Stroke Sister     History  Substance Use Topics  . Smoking status: Never Smoker   . Smokeless tobacco: Never Used  . Alcohol Use: No    OB History    Grav Para Term Preterm Abortions TAB SAB Ect Mult Living                  Review of Systems  Constitutional: Negative.  Negative for fever and chills.  HENT: Negative.   Eyes: Negative.  Negative for discharge and redness.  Respiratory: Negative.  Negative for cough and shortness of breath.   Cardiovascular: Negative.  Negative for chest pain.  Gastrointestinal: Negative.  Negative for nausea, vomiting,  abdominal pain and diarrhea.  Genitourinary: Negative.  Negative for dysuria and vaginal discharge.  Musculoskeletal: Positive for back pain.  Skin: Negative.  Negative for color change and rash.  Neurological: Negative.  Negative for syncope and headaches.  Hematological: Negative.  Negative for adenopathy.  Psychiatric/Behavioral: Negative.  Negative for confusion.  All other systems reviewed and are negative.    Allergies  Review of patient's allergies indicates no known allergies.  Home Medications   Current Outpatient Rx  Name Route Sig Dispense Refill  . ASPIRIN 325 MG PO TABS Oral Take 1 tablet (325 mg total) by mouth daily. 30 tablet 0  . ATENOLOL 50 MG PO TABS Oral Take 50 mg by mouth daily at 8 pm.      . LISINOPRIL 5 MG PO TABS Oral Take 1 tablet (5 mg total) by mouth daily. 30 tablet 0  . SIMVASTATIN 20 MG PO TABS Oral Take 1 tablet (20 mg total) by mouth daily at 6 PM. 30 tablet 0    BP 161/73  Pulse 65  Temp(Src) 97 F (36.1 C) (Oral)  Resp 18  Ht 5\' 7"  (1.702 m)  Wt 165 lb (74.844 kg)  BMI 25.84 kg/m2  SpO2 96%  Physical Exam  Constitutional: She is oriented to person, place, and time. She appears well-developed and well-nourished.  Non-toxic  appearance. She does not have a sickly appearance.  HENT:  Head: Normocephalic and atraumatic.  Eyes: Conjunctivae, EOM and lids are normal. Pupils are equal, round, and reactive to light. No scleral icterus.  Neck: Trachea normal and normal range of motion. Neck supple.  Cardiovascular: Normal rate, regular rhythm and normal heart sounds.   Pulmonary/Chest: Effort normal and breath sounds normal. No respiratory distress. She has no wheezes. She has no rales.  Abdominal: Soft. Normal appearance. There is no tenderness. There is no rebound, no guarding and no CVA tenderness.  Musculoskeletal: Normal range of motion.  Neurological: She is alert and oriented to person, place, and time. She has normal strength.  Skin: Skin  is warm, dry and intact. No rash noted.  Psychiatric: She has a normal mood and affect. Her behavior is normal. Judgment and thought content normal.    ED Course  Procedures (including critical care time)  Results for orders placed during the hospital encounter of 10/13/11  CBC      Component Value Range   WBC 4.4  4.0 - 10.5 (K/uL)   RBC 3.96  3.87 - 5.11 (MIL/uL)   Hemoglobin 13.0  12.0 - 15.0 (g/dL)   HCT 60.4  54.0 - 98.1 (%)   MCV 94.7  78.0 - 100.0 (fL)   MCH 32.8  26.0 - 34.0 (pg)   MCHC 34.7  30.0 - 36.0 (g/dL)   RDW 19.1  47.8 - 29.5 (%)   Platelets 130 (*) 150 - 400 (K/uL)  PROTIME-INR      Component Value Range   Prothrombin Time 14.7  11.6 - 15.2 (seconds)   INR 1.13  0.00 - 1.49   APTT      Component Value Range   aPTT 36  24 - 37 (seconds)   Dg Chest 2 View  10/14/2011  *RADIOLOGY REPORT*  Clinical Data: Chest pain, shortness of breath  CHEST - 2 VIEW  Comparison: September 17, 2011 and CT scan from yesterday  Findings: There is a small left pneumothorax (approximately 10%.) At the left mid - upper lung, opacity is again seen.  The right lung remains clear.  The cardiac silhouette, mediastinum, pulmonary vasculature are within normal limits.  IMPRESSION: Small left pneumothorax after lung biopsy yesterday.  Original Report Authenticated By: Brandon Melnick, M.D.           MDM  Chest x-ray reviewed in conjunction with the radiologist here and it does demonstrate a small pneumothorax approximate 5-10%.  I attempted to reach the patient's pulmonologist Dr. Juanetta Gosling via his office and via the any pain operators via page but have been unsuccessful.  I have called our pulmonology team to evaluate this patient and a have recommended admission for this patient for further observation of her pneumothorax.  She is to have a repeat chest x-ray approximately 6:30 PM this evening.  This test is been ordered as well his preliminary blood work should the patient require  intervention such as a chest tube for decompression of her pneumothorax.  Patient will be placed on 50% O2 by Ventimask as well.  Patient has remained stable and with normal oxygen saturations and is in no respiratory distress at this time.        Nat Christen, MD 10/14/11 [phone]

## 2011-10-14 NOTE — ED Notes (Signed)
Family at bedside. 

## 2011-10-14 NOTE — ED Notes (Signed)
[id number]

## 2011-10-14 NOTE — ED Notes (Signed)
Attempted to call report - nurse unavailable at this time - will return my call.

## 2011-10-14 NOTE — ED Notes (Signed)
Pt returned from xray - family member at bedside.

## 2011-10-15 ENCOUNTER — Inpatient Hospital Stay (HOSPITAL_COMMUNITY): Payer: Medicare PPO

## 2011-10-15 NOTE — Plan of Care (Signed)
Problem: Phase I Progression Outcomes Goal: Pain controlled Outcome: Not Progressing Worse today after traveling to xray for 2-view of chest this am. Goal: Discharge plan established Outcome: Progressing Home-good support from children,

## 2011-10-15 NOTE — Progress Notes (Signed)
Inpatient Progress Note Patient ID: Stephanie [name] MRN: [id number] DOB/AGE: 02/10/1941 70 y.o.  70 yr old female with PMH of HTN and hypercholesterolemia presenting to the hospital with CP. The patient had a "minor" stroke during thanksgiving where a CXR was done and was questionable for a lung mass. The patient herself is not a smoker but has been exposed to a lot of smoke in her lifetime. CT guided biopsy was done on 12/13 that was not eventful then on 12/14 patient noticed back pain and SOB. She was brought to the ED where a CXR revealed a 10% left sided PTX. PCCM was called to admit.  Subjective: Feeling sl better> denies CP or SOB at present. Daughter upset w/ communication all around, wants her to have pain meds prn.   Objective: Vital signs in last 24 hours: Filed Vitals:   10/15/11 0500 10/15/11 0600 10/15/11 0800 10/15/11 0840  BP: 116/53 124/52 116/53 116/54  Pulse: 49 48 50 48  Temp:    97.5 F (36.4 C)  TempSrc:    Oral  Resp: 21 20 20 19   Height:      Weight:      SpO2: 99% 98% 97% 97%    Intake/Output from previous day:  Intake/Output Summary (Last 24 hours) at 10/15/11 1015 Last data filed at 10/15/11 0800  Gross per 24 hour  Intake    120 ml  Output    200 ml  Net    -80 ml    Medications: Scheduled Meds:   . aspirin  325 mg Oral Daily  . atenolol  50 mg Oral Q2000  . HYDROcodone-acetaminophen  1 tablet Oral Once  . lisinopril  5 mg Oral Daily  . simvastatin  20 mg Oral q1800   Continuous Infusions:  PRN Meds:.HYDROcodone-acetaminophen  Physical Exam: General:  WD, WN, [name] in NAD; alert & oriented; pleasant & cooperative... HEENT:  [place]/AT; Conjunctiva- pink, Sclera- nonicteric, EOM-wnl, PERRLA, Fundi-benign; EACs-clear, TMs-wnl; NOSE-clear; THROAT-clear & wnl. Neck:  Supple w/ full ROM; no JVD; normal carotid impulses w/o bruits; no thyromegaly or nodules palpated; no lymphadenopathy. Chest:  decr BS bilat w/ basilar crackles, no wheezing or signs  of consolidation Heart:  Regular Rhythm; norm S1 & S2 without murmurs, rubs, or gallops detected. Abdomen:  Soft & nontender- no guarding or rebound; normal bowel sounds; no organomegaly or masses palpated. Ext:  Normal ROM; without deformities or arthritic changes; no varicose veins, venous insuffic, or edema;  Pulses intact w/o bruits. Neuro:  CNs II-XII intact; motor testing normal; sensory testing normal; gait normal & balance OK. Derm:  No lesions noted; no rash etc. Lymph:  No cervical, supraclavicular, axillary, or inguinal adenopathy palpated.  Lab Results:  Lab 10/14/11 2149 10/14/11 1718  NA 139 142  K 3.6 3.5  CL 103 104  CO2 28 26  BUN 14 14  CREATININE 1.08 1.00  GLUCOSE 191* 94  ]  Lab 10/14/11 1718 10/13/11 0847  HGB 14.0 13.0  HCT 39.9 37.5  WBC 7.0 4.4  PLT 141* 130*    Culture Results: MRSA screen 12/12 was NEG...  Studies/Results: X-ray Chest Pa And Lateral> 10/15/2011:  IMPRESSION: Slight increase in size of left-sided pneumothorax estimated at 15%. Slight increase in left lower lobe atelectasis.  Original Report Authenticated By: P. Loralie Champagne, M.D.     Dg Chest 2 View> 10/14/2011:  IMPRESSION: Small left pneumothorax after lung biopsy yesterday.  Original Report Authenticated By: Brandon Melnick, M.D.   Ct  Biopsy> 10/13/2011:  The patient tolerated biopsy well.  Post procedure imaging demonstrates no evidence of pneumothorax or effusion.  IMPRESSION: Successful CT fluoroscopy guided left upper lobe mass-like consolidation FNA and core biopsy.  Original Report Authenticated By: Judie Petit. Ruel Favors, M.D.   Assessment/Plan:  1. Iatrogenic Left Pneumothorax: Assessment> sl incr to 15% despite nasal O2. Plan> continue rest, oxygen, observation & daily CXR Qam.  2.  HBP: Followed by LMD DrGolding in [place] on Aten50 + Lisinopril5; good control, continue same.  3.  Stroke: Hosp several weeks ago w/ "mild" stroke & now on ASA325mg /d; continue  same.  4.  Hypercholesterolemia: Controlled on Simva20; continue same med.  5. 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   Michele Mcalpine, MD 10/15/2011, 10:15 AM

## 2011-10-15 NOTE — Plan of Care (Signed)
Problem: Phase I Progression Outcomes Goal: Flu/PneumoVaccines if indicated Outcome: Not Progressing Patient denies having had either vaccine but refuses both.

## 2011-10-16 ENCOUNTER — Inpatient Hospital Stay (HOSPITAL_COMMUNITY): Payer: Medicare PPO

## 2011-10-16 DIAGNOSIS — J95811 Postprocedural pneumothorax: Secondary | ICD-10-CM

## 2011-10-16 DIAGNOSIS — R079 Chest pain, unspecified: Secondary | ICD-10-CM

## 2011-10-16 DIAGNOSIS — R918 Other nonspecific abnormal finding of lung field: Secondary | ICD-10-CM

## 2011-10-16 MED ORDER — WHITE PETROLATUM GEL
Status: AC
  Administered 2011-10-16: 18:00:00
  Filled 2011-10-16: qty 5

## 2011-10-16 NOTE — Progress Notes (Signed)
Inpatient Progress Note Patient ID: Stephanie [name] MRN: [id number] DOB/AGE: [birth year] 69 y.o.  70 yr old female with PMH of HTN and hypercholesterolemia presenting to the hospital with CP. The patient had a "minor" stroke during thanksgiving where a CXR was done and was questionable for a lung mass. The patient herself is not a smoker but has been exposed to a lot of smoke in her lifetime. CT guided biopsy was done on 12/13 that was not eventful then on 12/14 patient noticed back pain and SOB. She was brought to the ED where a CXR revealed a 10% left sided PTX. PCCM was called to admit.  Subjective: Feeling about the same> denies CP or SOB at present. I updated daughter on today's CXR & need for poss chest tube vs minithoracotomy/OLBx for further eval pending Bcx results...   Objective: Vital signs in last 24 hours: Filed Vitals:   10/16/11 0700 10/16/11 0800 10/16/11 0839 10/16/11 0900  BP: 129/67  130/65 119/50  Pulse: 58 61 66 64  Temp:   98.4 F (36.9 C)   TempSrc:   Oral   Resp:      Height:      Weight:      SpO2: 99% 98% 98% 99%    Intake/Output from previous day:  Intake/Output Summary (Last 24 hours) at 10/16/11 1256 Last data filed at 10/16/11 0900  Gross per 24 hour  Intake   1080 ml  Output      0 ml  Net   1080 ml    Medications: Scheduled Meds:    . aspirin  325 mg Oral Daily  . atenolol  50 mg Oral Q2000  . lisinopril  5 mg Oral Daily  . simvastatin  20 mg Oral q1800   Continuous Infusions:  PRN Meds:.HYDROcodone-acetaminophen  Physical Exam: General:  WD, WN, [name] in NAD; alert & oriented; pleasant & cooperative... HEENT:  [place]/AT; Conjunctiva- pink, Sclera- nonicteric, EOM-wnl, PERRLA, Fundi-benign; EACs-clear, TMs-wnl; NOSE-clear; THROAT-clear & wnl. Neck:  Supple w/ full ROM; no JVD; normal carotid impulses w/o bruits; no thyromegaly or nodules palpated; no lymphadenopathy. Chest:  decr BS bilat w/ basilar crackles, no wheezing or signs of  consolidation Heart:  Regular Rhythm; norm S1 & S2 without murmurs, rubs, or gallops detected. Abdomen:  Soft & nontender- no guarding or rebound; normal bowel sounds; no organomegaly or masses palpated. Ext:  Normal ROM; without deformities or arthritic changes; no varicose veins, venous insuffic, or edema;  Pulses intact w/o bruits. Neuro:  CNs II-XII intact; motor testing normal; sensory testing normal; gait normal & balance OK. Derm:  No lesions noted; no rash etc. Lymph:  No cervical, supraclavicular, axillary, or inguinal adenopathy palpated.  Lab Results:  Lab 10/14/11 2149 10/14/11 1718  NA 139 142  K 3.6 3.5  CL 103 104  CO2 28 26  BUN 14 14  CREATININE 1.08 1.00  GLUCOSE 191* 94  ]  Lab 10/14/11 1718 10/13/11 0847  HGB 14.0 13.0  HCT 39.9 37.5  WBC 7.0 4.4  PLT 141* 130*    Culture Results: MRSA screen 12/12 was NEG...  Studies/Results: CXR 10/16/11> Findings: Unchanged cardiac silhouette and mediastinal contours. Grossly unchanged appearance of small left-sided pneumothorax.  Interval increase in left basilar heterogeneous consolidative opacities. Small left-sided pleural effusion. The right hemithorax is unchanged. Grossly unchanged bones. Calcifications within the splenic vein.  IMPRESSION: 1. Grossly unchanged appearance of small left-sided pneumothorax. 2. Interval increase in left-sided pleural effusion and left  basilar  opacities, possibly atelectasis and though worsening infection not excluded.  X-ray Chest PA and Lateral> 10/15/2011:  IMPRESSION: Slight increase in size of left-sided pneumothorax estimated at 15%. Slight increase in left lower lobe atelectasis.     Dg Chest 2 View> 10/14/2011:  IMPRESSION: Small left pneumothorax after lung biopsy yesterday.    Ct Biopsy> 10/13/2011:  The patient tolerated biopsy well.  Post procedure imaging demonstrates no evidence of pneumothorax or effusion.  IMPRESSION: Successful CT fluoroscopy guided left upper lobe  mass-like consolidation FNA and core biopsy.     Assessment/Plan:  1. Abnormal CXR ?etiology & Iatrogenic Left Pneumothorax: 12/15> sl incr to 15% despite nasal O2... Plan: continue rest, oxygen, observation & daily CXR Qam. 12/16> pneumothor essent unchanged but incr effus, atx, opacity in left lung... Plan: try IS, f/u film in AM, await Bx results & consider options (chest tube rx vs need for minithoracotomy/ OLBx in addition)...  2.  HBP: Followed by LMD DrGolding in [place] on Aten50 + Lisinopril5; good control, continue same.  3.  Stroke: Hosp several weeks ago w/ "mild" stroke & now on ASA325mg /d; continue same.  4.  Hypercholesterolemia: Controlled on Simva20; continue same med.  5. 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   Michele Mcalpine, MD 10/16/2011, 12:56 PM

## 2011-10-17 ENCOUNTER — Inpatient Hospital Stay (HOSPITAL_COMMUNITY): Payer: Medicare PPO

## 2011-10-17 DIAGNOSIS — R079 Chest pain, unspecified: Secondary | ICD-10-CM

## 2011-10-17 DIAGNOSIS — R222 Localized swelling, mass and lump, trunk: Secondary | ICD-10-CM

## 2011-10-17 LAB — LACTATE DEHYDROGENASE: LDH: 240 U/L (ref 94–250)

## 2011-10-17 NOTE — Plan of Care (Signed)
Problem: Consults Goal: Nutrition Consult-if indicated Outcome: Not Progressing No appetite or po intake since admission. Dietary consult on high protein foods or supplements before discharge  Problem: Phase I Progression Outcomes Goal: Pain controlled Outcome: Completed/Met Date Met:  10/17/11 No request for pain med in 24 hours. Goal: Progress activity as tolerated unless otherwise ordered Outcome: Completed/Met Date Met:  10/17/11 Up in room, to bathroom without dyspnea.

## 2011-10-17 NOTE — Consult Note (Addendum)
Reason for consult: Adenocarcinoma of the lung Consulting MD: Dr. Arline Asp Date of Consultation: 10/17/2011  HPI: 70 y/o wf from [place], [place],  second hand smoker, admitted on 10/13/2011 with CP, following a recent "mini stroke" on 11/17 involving reversible symptoms of R arm weakness and transient aphasia, at which time, CXR was performed, revealing a questionable lung mass. A CT chest w contrast 11/17 revealed a 3x4 cm infiltrative LUL density suspicious for PNA vs neoplasm. In addition, 5 mm and 4 mm  RUL nodules were seen. CT head without contrast 11/17 was negative. After d/c same day, pt was to be f/u as OP. On 12/13 she underwent a CT guided bx as OP with results positive for well differentiated adenocarcinoma ([id number] Dr. Laureen Ochs) , with status complicated by L sided PTX. PCCM admitted patient for further workup. At this time, PET scan is pending. We were informed of the patient's admission with recommendations regarding her care.    PMH: Past Medical History  Diagnosis Date  . Hypertension   . Arthritis   . CVA (cerebral infarction)   . Stroke   . Hypercholesteremia     Surgeries: Past Surgical History  Procedure Date  . No past surgeries   . Lung biopsy 10/13/11    left    Allergies: No Known Allergies  Medications:   Scheduled:   . aspirin  325 mg Oral Daily  . atenolol  50 mg Oral Q2000  . lisinopril  5 mg Oral Daily  . simvastatin  20 mg Oral q1800  . white petrolatum       NWG:[id number]  KGM:WNUUVO of Systems  Constitutional: Negative  for weight loss. Negative for fever, chills and malaise/fatigue. Stays cold at all times Eyes: Negative for blurred vision and double vision.  Respiratory: Negative for cough or hemoptysis and shortness of breath has improved .  Cardiovascular: L sided  chest pain much improved. GI: No nausea, vomiting, diarrhea, constipation. No change in bowel caliber. No  Melena or  Hematochezia.  GU: No blood in urine.  No loss of urinary control. Skin: Negative for itching. No rash. No petechia. No bruising. Pt has a "lesion in the nose for a long time" Denies other areas of suspicious moles or open lesions. Neurological: No headaches. No motor or sensory deficits. Pt is not up to date with screening tests. She never had a colonoscopy. Her last MGM was 10 years ago.      Family History: Family History  Problem Relation Age of Onset  . Stroke Mother   . Stroke Sister   Father had "blood clots" One half brother died with "bone cancer" at age 26     Social History:  reports that she has never smoked. She has never used smokeless tobacco.Pt was a second hand smoker. Her husband smoked a pipe for at least 40 years. 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 She reports that she does not drink alcohol or use illicit drugs. Pt is widowed. Her husband commited suicide a few years ago. She has 3  Daughters, ages 57. 18 and 60 in good health.  Physical Exam  70 y.o. female in no acute distress  A. and O. X3 tearful  HEENT: Normocephalic, there is an 1 cm superficial lesion on the R nostril suspicious for?BCC , PERRLA. Oral cavity without thrush or lesions. Neck: supple. no thyromegaly, no cervical or supraclavicular adenopathy  Lungs: decreased BS bilaterally .  No wheezing, rhonchi or trace of rales. No axillary masses. Breasts: not examined. Cardiac: regular rate and rhythm normal S1-S2, no murmur , rubs or gallops Abdomen: soft nontender , bowel sounds x4  no hepatosplenomegaly GU/rectal: deferred. Extremities: no clubbing cyanosis . No edema.No bruising or petechial rash. Multiple freckles.  Neurologic: A. and O. x3, no focal deficits Msk: arthritic changes in hands.     Labs  CBC  Lab 10/14/11 1718 10/13/11 0847  WBC 7.0 4.4  HGB 14.0 13.0  HCT 39.9 37.5  PLT 141* 130*  MCV 94.5 94.7  MCH 33.2 32.8  MCHC 35.1 34.7  RDW  12.3 12.2  LYMPHSABS -- --  MONOABS -- --  EOSABS -- --  BASOSABS -- --  BANDABS -- --    CMP    Lab 10/14/11 2149 10/14/11 1718  NA 139 142  K 3.6 3.5  CL 103 104  CO2 28 26  GLUCOSE 191* 94  BUN 14 14  CREATININE 1.08 1.00  CALCIUM 8.9 9.6  MG -- --  AST -- --  ALT -- --  ALKPHOS -- --  BILITOT -- --        Component Value Date/Time   BILITOT 0.7 09/17/2011 1010    Anemia panel: No results found for this basename: VITAMINB12:2,FOLATE:2,FERRITIN:2,TIBC:2,IRON:2,RETICCTPCT:2 in the last 72 hours  No results found for this basename: TSH,T4TOTAL,FREET3,T3FREE,THYROIDAB in the last 72 hours   No results found for this basename: esrsedrate     Lab 10/14/11 2149 10/14/11 1718 10/13/11 0847  INR 1.18 1.14 1.13  PROTIME -- -- --    No results found for this basename: DDIMER:2 in the last 72 hours  Imaging Studies: Dg Chest Port 1 View  10/16/2011  *RADIOLOGY REPORT*  Clinical Data: Evaluate pneumothorax  PORTABLE CHEST - 1 VIEW  Comparison: 10/15/2011; 10/14/2011; CT-guided chest biopsy - 10/13/2011  Findings: Unchanged cardiac silhouette and mediastinal contours. Grossly unchanged appearance of small left-sided pneumothorax. Interval increase in left basilar heterogeneous consolidative opacities.  Small left-sided pleural effusion.  The right hemithorax is unchanged.  Grossly unchanged bones.  Calcifications within the splenic vein.  IMPRESSION: 1.  Grossly unchanged appearance of small left-sided pneumothorax. 2.  Interval increase in left-sided pleural effusion and left basilar opacities, possibly atelectasis and though worsening infection not excluded.  Original Report Authenticated By: Waynard Reeds, M.D.      CT CHEST WITH CONTRAST  Technique: Multidetector CT imaging of the chest was performed following the standard protocol during bolus administration of  intravenous contrast.  Contrast: 80mL OMNIPAQUE IOHEXOL 300 MG/ML IV SOLN  Comparison: Chest x-ray  09/17/2011  Findings: Left upper lobe infiltrative type density measures 3 x 4 cm and contains some air bronchograms. This corresponds to the chest x-ray density. This may represent pneumonia however neoplasm such as bronchoalveolar carcinoma could have this appearance. Close radiographic follow up is suggested. 5 mm right upper lobe nodule just above the minor fissure is noted. 4 mm right lower lobe nodule laterally also noted. These are not calcified. Bibasilar atelectasis is present. Chronic lung disease is present. No pleural effusion is present. Negative for mediastinal adenopathy. Coronary artery calcification  is present.  IMPRESSION:  3 x 4 cm infiltrative density left upper lobe. This may represent pneumonia versus neoplasm. Radiographic followup is suggested to evaluate for clearing. Two nodules in the right lung are present which could be due to granulomata or metastatic disease.  Original Report Authenticated By: Camelia Phenes, M.D.   CT HEAD  WITHOUT CONTRAST  Technique: Contiguous axial images were obtained from the base of the skull through the vertex without contrast.  Comparison: None. Findings: Chronic microvascular ischemic change is noted. No evidence of acute infarction, hemorrhage, mass lesion, mass effect, midline shift or abnormal extra-axial fluid collection. There is no hydrocephalus or pneumocephalus. Atherosclerotic vascular disease is noted. There is some cortical atrophy. IMPRESSION: Atrophy and chronic microvascular ischemic change. No acute abnormality.    A/P: 70 y.o. female asked to see for evaluation of well differentiated adenocarcinoma.  Dr. Arline Asp    is to see the patient following this consult with recommendations regarding diagnosis, and further workup studies.  Awaiting PET scan results   Thank you for the referral.  [facility] E 10/17/2011 2:40 PM    Chart and imaging studies reviewed.  Case discussed with Dr. Molli Knock and pathologist Dr. Jimmy Picket.  Pt seen and examined.  Spoke with pt's 3 adult children.  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  Pt had been asymptomatic when she was found to have an abnormal CXR at the time of her mild stroke about a month ago.  Pt had PFTs today, results pending.  She is scheduled for a PET scan.  Hopefully, this will be negative for any metastatic disease in which case, pt will need a surgical consult for resection.  Pt and family would like any surgery to be done after Xmas.  Pt may be a candidate for chemotherapy if she has +LN or evidence of other lung lesions or distant spread.  It will be important to carry out studies for EGFR mutation and alk translocation--either on the biopsy (if no surgery is done) or on the surgical specimen.  Thank you for asking [place] to see Ms. Guo.  If any questions, I can be reached on pager [phone].  Samul Dada, MD 10/17/11

## 2011-10-17 NOTE — Progress Notes (Signed)
Inpatient Progress Note Patient ID: Stephanie [name] MRN: [id number] DOB/AGE: [birth year] 70 y.o.  70 yr old female with PMH of HTN and hypercholesterolemia presenting to the hospital with CP. The patient had a "minor" stroke during thanksgiving where a CXR was done and was questionable for a lung mass. The patient herself is not a smoker but has been exposed to a lot of smoke in her lifetime. CT guided biopsy was done on 12/13 that was not eventful then on 12/14 patient noticed back pain and SOB. She was brought to the ED where a CXR revealed a 10% left sided PTX. PCCM was called to admit.  Subjective: Feeling about the same> denies CP or SOB at present. I updated daughter on today's CXR & need for poss chest tube vs minithoracotomy/OLBx for further eval pending Bcx results.  Objective: Vital signs in last 24 hours: Filed Vitals:   10/17/11 1100 10/17/11 1147 10/17/11 1200 10/17/11 1300  BP:  128/64    Pulse: 59 63 112 57  Temp:  97.6 F (36.4 C)    TempSrc:      Resp:      Height:      Weight:      SpO2: 98% 100% 100% 98%    Intake/Output from previous day:  Intake/Output Summary (Last 24 hours) at 10/17/11 1338 Last data filed at 10/17/11 1000  Gross per 24 hour  Intake    920 ml  Output    450 ml  Net    470 ml      . aspirin  325 mg Oral Daily  . atenolol  50 mg Oral Q2000  . lisinopril  5 mg Oral Daily  . simvastatin  20 mg Oral q1800  . white petrolatum       Continuous Infusions:  PRN Meds:.HYDROcodone-acetaminophen  Physical Exam: General:  WD, WN, [name] in NAD; alert & oriented; pleasant & cooperative... HEENT:  [place]/AT; Conjunctiva- pink, Sclera- nonicteric, EOM-wnl, PERRLA, Fundi-benign; EACs-clear, TMs-wnl; NOSE-clear; THROAT-clear & wnl. Neck:  Supple w/ full ROM; no JVD; normal carotid impulses w/o bruits; no thyromegaly or nodules palpated; no lymphadenopathy. Chest:  decr BS bilat w/ basilar crackles, no wheezing or signs of consolidation Heart:  Regular  Rhythm; norm S1 & S2 without murmurs, rubs, or gallops detected. Abdomen:  Soft & nontender- no guarding or rebound; normal bowel sounds; no organomegaly or masses palpated. Ext:  Normal ROM; without deformities or arthritic changes; no varicose veins, venous insuffic, or edema;  Pulses intact w/o bruits. Neuro:  CNs II-XII intact; motor testing normal; sensory testing normal; gait normal & balance OK. Derm:  No lesions noted; no rash etc. Lymph:  No cervical, supraclavicular, axillary, or inguinal adenopathy palpated.  Lab Results:  Lab 10/14/11 2149 10/14/11 1718  NA 139 142  K 3.6 3.5  CL 103 104  CO2 28 26  BUN 14 14  CREATININE 1.08 1.00  GLUCOSE 191* 94    Lab 10/14/11 1718 10/13/11 0847  HGB 14.0 13.0  HCT 39.9 37.5  WBC 7.0 4.4  PLT 141* 130*    Culture Results: MRSA screen 12/12 was NEG...  Studies/Results: CXR 10/16/11> Findings: Unchanged cardiac silhouette and mediastinal contours. Grossly unchanged appearance of small left-sided pneumothorax.  Interval increase in left basilar heterogeneous consolidative opacities. Small left-sided pleural effusion. The right hemithorax is unchanged. Grossly unchanged bones. Calcifications within the splenic vein.  IMPRESSION: 1. Grossly unchanged appearance of small left-sided pneumothorax. 2. Interval increase in left-sided pleural effusion and  left  basilar opacities, possibly atelectasis and though worsening infection not excluded.  X-ray Chest PA and Lateral> 10/15/2011:  IMPRESSION: Slight increase in size of left-sided pneumothorax estimated at 15%. Slight increase in left lower lobe atelectasis.     Dg Chest 2 View> 10/14/2011:  IMPRESSION: Small left pneumothorax after lung biopsy yesterday.    Ct Biopsy> 10/13/2011:  The patient tolerated biopsy well.  Post procedure imaging demonstrates no evidence of pneumothorax or effusion.  IMPRESSION: Successful CT fluoroscopy guided left upper lobe mass-like consolidation FNA and  core biopsy.     Assessment/Plan:  1. Abnormal CXR ?etiology & Iatrogenic Left Pneumothorax: 12/15> sl incr to 15% despite nasal O2... Plan: continue rest, oxygen, observation & daily CXR Qam. 12/16> pneumothorax essent unchanged but incr effus, atx, opacity in left lung... 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  At this point, no sense placing a chest tube until we realize the treatment modality, if can be treated surgically then the PTX will be treated then if not then it is not affecting patient and will likely resolve with time.  Will continue to take CXR to insure it is not enlarging however, if it does will need chest tube.  2.  HBP: Followed by LMD Dr Phillips Odor in [place] on Aten 50 + Lisinopril 5; good control, continue same.  3.  Stroke: Hosp several weeks ago w/ "mild" stroke & now on ASA325mg /d; continue same.  4.  Hypercholesterolemia: Controlled on Simva20; continue same med.  5. 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  6. [place] of the LUL: I spent over an hour with the patient and family informing them of the diagnosis and in social support in general and explaining course of action including pet/ct, oncology consultation and PFT's to be ordered today.  Will keep patient on our service and continue to follow.  Koren Bound, MD 10/17/2011, 1:38 PM

## 2011-10-18 ENCOUNTER — Inpatient Hospital Stay (HOSPITAL_COMMUNITY): Payer: Medicare PPO

## 2011-10-18 DIAGNOSIS — R918 Other nonspecific abnormal finding of lung field: Secondary | ICD-10-CM

## 2011-10-18 DIAGNOSIS — J95811 Postprocedural pneumothorax: Secondary | ICD-10-CM

## 2011-10-18 DIAGNOSIS — R079 Chest pain, unspecified: Secondary | ICD-10-CM

## 2011-10-18 LAB — BASIC METABOLIC PANEL
BUN: 17 mg/dL (ref 6–23)
Calcium: 9.2 mg/dL (ref 8.4–10.5)
GFR calc Af Amer: 59 mL/min — ABNORMAL LOW (ref >90)
GFR calc non Af Amer: 51 mL/min — ABNORMAL LOW (ref >90)
Glucose, Bld: 118 mg/dL — ABNORMAL HIGH (ref 70–99)
Potassium: 3.8 mEq/L (ref 3.5–5.1)

## 2011-10-18 LAB — MAGNESIUM: Magnesium: 2 mg/dL (ref 1.5–2.5)

## 2011-10-18 LAB — CBC
MCH: 32.2 pg (ref 26.0–34.0)
MCHC: 33.9 g/dL (ref 30.0–36.0)
Platelets: 155 10*3/uL (ref 150–400)
RDW: 12.2 % (ref 11.5–15.5)

## 2011-10-18 LAB — PHOSPHORUS: Phosphorus: 3.8 mg/dL (ref 2.3–4.6)

## 2011-10-18 NOTE — Progress Notes (Signed)
Inpatient Progress Note Patient ID: Stephanie [name] MRN: [id number] DOB/AGE: 69-Nov-1942 69 y.o.  70 yr old female with PMH of HTN and hypercholesterolemia presenting to the hospital with CP. The patient had a "[name]" stroke during thanksgiving where a CXR was done and was questionable for a lung mass. The patient herself is not a smoker but has been exposed to a lot of smoke in her lifetime. CT guided biopsy was done on 12/13 that was not eventful then on 12/14 patient noticed back pain and SOB. She was brought to the ED where a CXR revealed a 10% left sided PTX. PCCM was called to admit.  Subjective: Feeling about the same> denies CP or SOB at present. I updated daughter on today's CXR & need for poss chest tube vs minithoracotomy/OLBx for further eval pending Bcx results.  Objective: Vital signs in last 24 hours: Filed Vitals:   10/17/11 2041 10/18/11 0114 10/18/11 0437 10/18/11 0745  BP: 118/49 136/56 121/52 127/60  Pulse: 67  59 53  Temp: 97.6 F (36.4 C) 97.2 F (36.2 C) 97.9 F (36.6 C) 98.3 F (36.8 C)  TempSrc: Oral Oral Oral   Resp:      Height:      Weight:  176 lb 12.9 oz (80.2 kg)    SpO2: 96% 99% 98% 98%    Intake/Output from previous day:  Intake/Output Summary (Last 24 hours) at 10/18/11 0839 Last data filed at 10/17/11 2000  Gross per 24 hour  Intake    610 ml  Output    150 ml  Net    460 ml      . aspirin  325 mg Oral Daily  . atenolol  50 mg Oral Q2000  . lisinopril  5 mg Oral Daily  . simvastatin  20 mg Oral q1800   Continuous Infusions:  PRN Meds:.HYDROcodone-acetaminophen  Physical Exam: General:  WD, WN, [name] in NAD; alert & oriented; pleasant & cooperative... HEENT:  [place]/AT; Conjunctiva- pink, Sclera- nonicteric, EOM-wnl, PERRLA, Fundi-benign; EACs-clear, TMs-wnl; NOSE-clear; THROAT-clear & wnl. Neck:  Supple w/ full ROM; no JVD; normal carotid impulses w/o bruits; no thyromegaly or nodules palpated; no lymphadenopathy. Chest:  decr BS bilat  w/ basilar crackles, no wheezing or signs of consolidation Heart:  Regular Rhythm; norm S1 & S2 without murmurs, rubs, or gallops detected. Abdomen:  Soft & nontender- no guarding or rebound; normal bowel sounds; no organomegaly or masses palpated. Ext:  Normal ROM; without deformities or arthritic changes; no varicose veins, venous insuffic, or edema;  Pulses intact w/o bruits. Neuro:  CNs II-XII intact; motor testing normal; sensory testing normal; gait normal & balance OK. Derm:  No lesions noted; no rash etc. Lymph:  No cervical, supraclavicular, axillary, or inguinal adenopathy palpated.  Lab Results:  Lab 10/18/11 0427 10/14/11 2149 10/14/11 1718  NA 139 139 142  K 3.8 3.6 3.5  CL 103 103 104  CO2 28 28 26   BUN 17 14 14   CREATININE 1.08 1.08 1.00  GLUCOSE 118* 191* 94    Lab 10/18/11 0427 10/14/11 1718 10/13/11 0847  HGB 12.0 14.0 13.0  HCT 35.4* 39.9 37.5  WBC 4.9 7.0 4.4  PLT 155 141* 130*    Culture Results: MRSA screen 12/12 was NEG...  Studies/Results: CXR 10/16/11> Findings: Unchanged cardiac silhouette and mediastinal contours. Grossly unchanged appearance of small left-sided pneumothorax.  Interval increase in left basilar heterogeneous consolidative opacities. Small left-sided pleural effusion. The right hemithorax is unchanged. Grossly unchanged bones. Calcifications within  the splenic vein.  IMPRESSION: 1. Grossly unchanged appearance of small left-sided pneumothorax. 2. Interval increase in left-sided pleural effusion and left  basilar opacities, possibly atelectasis and though worsening infection not excluded.  X-ray Chest PA and Lateral> 10/15/2011:  IMPRESSION: Slight increase in size of left-sided pneumothorax estimated at 15%. Slight increase in left lower lobe atelectasis.     Dg Chest 2 View> 10/14/2011:  IMPRESSION: Small left pneumothorax after lung biopsy yesterday.    Ct Biopsy> 10/13/2011:  The patient tolerated biopsy well.  Post procedure  imaging demonstrates no evidence of pneumothorax or effusion.  IMPRESSION: Successful CT fluoroscopy guided left upper lobe mass-like consolidation FNA and core biopsy.    12/18 WJX:BJYN resolution of the left pneumothorax. There may be a tiny apical component remaining.Improved aeration in the left lung.    Assessment/Plan:  1. Abnormal CXR ?etiology & Iatrogenic Left Pneumothorax: 12/15> sl incr to 15% despite nasal O2... Plan: continue rest, oxygen, observation & daily CXR Qam. 12/16> pneumothorax essent unchanged but incr effus, atx, opacity in left lung... 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  At this point, no sense placing a chest tube until we realize the treatment modality, if can be treated surgically then the PTX will be treated then if not then it is not affecting patient and will likely resolve with time.  Will continue to take CXR to insure it is not enlarging however, if it does will need chest tube. 12/18 resolving pnx 2.  HBP: Followed by LMD Dr Phillips Odor in [place] on Aten 50 + Lisinopril 5; good control, continue same.  3.  Stroke: Hosp several weeks ago w/ "mild" stroke & now on ASA325mg /d; continue same.  4.  Hypercholesterolemia: Controlled on Simva20; continue same med.  5. 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  6. AdenoCa of the LUL: I spent over an hour with the patient and family informing them of the diagnosis and in social support in general and explaining course of action including pet/ct, oncology consultation and PFT's to be ordered today.  Will keep patient on our service and continue to follow. Seen by hemonc 12/17. 7. Disposition: -transfer to floor 12/18 dc home if able and follow up with Dr. Delton Coombes as outpatient.  Stephanie [name] ACNP [facility] PCCM Pager [phone] till 3 pm If no answer page [phone] 10/18/2011, 8:40 AM  Patient seen and examined, agree with above note.  I  dictated the care and orders written for this patient under my direction.  Koren Bound, M.D. [phone]

## 2011-10-19 ENCOUNTER — Encounter (HOSPITAL_COMMUNITY)
Admit: 2011-10-19 | Discharge: 2011-10-19 | Disposition: A | Discharge status: Home / Self Care | Payer: Medicare PPO | Attending: Pulmonary Disease | Admitting: Pulmonary Disease

## 2011-10-19 ENCOUNTER — Encounter (HOSPITAL_COMMUNITY): Payer: Self-pay

## 2011-10-19 DIAGNOSIS — C3492 Malignant neoplasm of unspecified part of left bronchus or lung: Secondary | ICD-10-CM

## 2011-10-19 DIAGNOSIS — C3410 Malignant neoplasm of upper lobe, unspecified bronchus or lung: Secondary | ICD-10-CM | POA: Insufficient documentation

## 2011-10-19 MED ORDER — FLUDEOXYGLUCOSE F - 18 (FDG) INJECTION
15.7000 | Freq: Once | INTRAVENOUS | Status: AC | PRN
  Administered 2011-10-19: 15.7 via INTRAVENOUS

## 2011-10-19 MED ORDER — HYDROCODONE-ACETAMINOPHEN 5-325 MG PO TABS: 1.0000 | ORAL_TABLET | ORAL | Status: DC | PRN

## 2011-10-19 NOTE — Discharge Summary (Signed)
Physician Discharge Summary  Patient ID: Stephanie [name] MRN: [id number] DOB/AGE: May 24, 1941 70 y.o.  Admit date: 10/14/2011 Discharge date: 10/19/2011    Discharge Diagnoses:  Principal Problem:  *Pneumothorax of left lung after biopsy Active Problems:  HTN (hypertension)  Adenocarcinoma    Brief Summary: Stephanie [name] is a 67 y.o. y/o female with a PMH of HTN and hypercholesterolemia presented to the hospital 12/14 with c/o CP. The patient had a "minor" stroke during thanksgiving where a CXR was done and was questionable for a lung mass. CT guided biopsy was done on 12/13 that was not eventful then on 12/14 patient noticed back pain and SOB. She was brought to the ED where a CXR revealed a 10% left sided PTX. PCCM was called to admit. 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  Oncology was consulted and PET scan is pending. Follow up plan to be determined based on PET findings.     Hospital Course by Discharge Diagnosis  Pneumothorax of left lung after biopsy--  Post CT guided lung bx. Treated with O2, rest.  CXR followed closely, 15% ptx at largest.  Now essentially resolved. Will f/u with pulm as outpt.   HTN (hypertension) -- followed by PCP Phillips Odor in [place].  Cont current rx lisinopril, atenolol   Stroke - Recent admit with "mild" CVA.  Cont asa. Outpt f.u per PCP.   Adenocarcinoma -- Seen in consultation by Dr. Arline Asp.  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  Underwent CT guided bx 12/13.  PET scan is pending, should be performed today 12/19.  Will proceed with d/c and let pt go for PET as outpt this afternoon.  Family very concerned about getting results before Christmas holiday.  Will have her see Rubye Oaks NP in pulmonary office 12/20 at 4pm.  Hope to have PET results for them then. Plan is as follows-- if PET NEGATIVE for metastatic disease pt will need to see [facility]) for consultation for resection.  If PET  POSITIVE pt will need to see oncology, either [facility] or [facility]) for consultation regvarding ?chemo rx.    Consults: Oncology ([facility])   Lines/tubes: none   Discharge Exam: General: WD, WN, [name] in NAD; alert & oriented; pleasant & cooperative...  HEENT: [place]/AT; Conjunctiva- pink, Sclera- nonicteric, EOM-wnl, PERRLA, Fundi-benign; EACs-clear, TMs-wnl; NOSE-clear; THROAT-clear & wnl.  Neck: Supple w/ full ROM; no JVD; normal carotid impulses w/o bruits; no thyromegaly or nodules palpated; no lymphadenopathy.  Chest: decr BS bilat w/ basilar crackles, no wheezing or signs of consolidation  Heart: Regular Rhythm; norm S1 & S2 without murmurs, rubs, or gallops detected.  Abdomen: Soft & nontender- no guarding or rebound; normal bowel sounds; no organomegaly or masses palpated.  Ext: Normal ROM; without deformities or arthritic changes; no varicose veins, venous insuffic, or edema; Pulses intact w/o bruits.  Neuro: CNs II-XII intact; motor testing normal; sensory testing normal; gait normal & balance OK.  Derm: No lesions noted; no rash etc.  Lymph: No cervical, supraclavicular, axillary, or inguinal adenopathy palpated.   Discharge Labs BMET    Component Value Date/Time   NA 139 10/18/2011 0427   K 3.8 10/18/2011 0427   CL 103 10/18/2011 0427   CO2 28 10/18/2011 0427   GLUCOSE 118* 10/18/2011 0427   BUN 17 10/18/2011 0427   CREATININE 1.08 10/18/2011 0427   CALCIUM 9.2 10/18/2011 0427   GFRNONAA 51* 10/18/2011 0427   GFRAA 59* 10/18/2011 0427   Lab Results  Component Value Date   WBC 4.9 10/18/2011   HGB 12.0 10/18/2011   HCT 35.4* 10/18/2011   MCV 94.9 10/18/2011   PLT 155 10/18/2011      Discharge Orders    Future Appointments: Provider: Department: Dept Phone: Center:   10/19/2011 12:30 PM [facility] [phone]   10/20/2011 4:00 PM Rubye Oaks, NP [facility] [phone] None   11/04/2011 3:00 PM Leslye Peer, MD [facility] [phone] None      [place], [place]  Home Medication Instructions [id number]   Printed on:10/19/11 0959  Medication Information                    aspirin 325 MG tablet Take 1 tablet (325 mg total) by mouth daily.           simvastatin (ZOCOR) 20 MG tablet Take 1 tablet (20 mg total) by mouth daily at 6 PM.           lisinopril (PRINIVIL,ZESTRIL) 5 MG tablet Take 1 tablet (5 mg total) by mouth daily.           atenolol (TENORMIN) 50 MG tablet Take 50 mg by mouth daily at 8 pm.             HYDROcodone-acetaminophen (NORCO) 5-325 MG per tablet Take 1 tablet by mouth every 4 (four) hours as needed for pain.              Follow-up Information    Follow up with Leslye Peer., MD on 11/04/2011. (@ 3pm on [place] across from [place])    Contact information:   [address] [employer] [place] [address] [phone]       Call Samul Dada, MD. (office will call you with appt time)    Contact information:   [address] [place] [address] [phone]       Follow up with [facility], NP on 10/20/2011. (400pm )    Contact information:   [employer] [address] [place] [address] [phone]          Disposition: Home or Self Care Discharged Condition: Stephanie [name] has met maximum benefit of inpatient care and is medically stable and cleared for discharge.  Patient is pending follow up as above.      Time spent on disposition:  Greater than 35 minutes.   SignedDanford Bad, NP 10/19/2011  10:08 AM  *Care during the described time interval was provided by me and/or other providers on the critical care team. I have reviewed this patient's available data, including medical history, events of note, physical examination and test results as part of my evaluation.    Patient seen and examined, agree with above note.  I dictated the  care and orders written for this patient under my direction.  Koren Bound, M.D. [phone]

## 2011-10-19 NOTE — Progress Notes (Signed)
D/C tele; VS stable. D/C instructions are discussed with pt and family and they verbalized understanding. Peripheral IV in LPFA is left in place per MD's order. Pt is being discharged by MD/PA and PET SCAN tech is aware and stated that it is ok for Pt's family to take pt to [facility] for the PET scan. Pt  And her family are advised to be at [facility] by 1115 per PET SCAN tech request. Sapir Lavey, Jeanie Sewer, RN

## 2011-10-20 ENCOUNTER — Ambulatory Visit (INDEPENDENT_AMBULATORY_CARE_PROVIDER_SITE_OTHER): Payer: Medicare PPO | Admitting: Adult Health

## 2011-10-20 ENCOUNTER — Encounter: Payer: Self-pay | Admitting: Adult Health

## 2011-10-20 VITALS — BP 138/72 | HR 67 | Ht 67.0 in | Wt 175.6 lb

## 2011-10-20 DIAGNOSIS — C801 Malignant (primary) neoplasm, unspecified: Secondary | ICD-10-CM

## 2011-10-20 LAB — GLUCOSE, CAPILLARY: Glucose-Capillary: 37 mg/dL — CL (ref 70–99)

## 2011-10-20 NOTE — Patient Instructions (Signed)
We are referring you to [facility] , Dr Scheryl Darter office for evaluation of your lung mass.  Continue on current regimen.  follow up Dr. Delton Coombes in 2 weeks and As needed   Please contact office for sooner follow up if symptoms do not improve or worsen or seek emergency care

## 2011-10-21 ENCOUNTER — Telehealth: Payer: Self-pay | Admitting: Critical Care Medicine

## 2011-10-21 MED ORDER — OSELTAMIVIR PHOSPHATE 75 MG PO CAPS: 75.0000 mg | ORAL_CAPSULE | Freq: Two times a day (BID) | ORAL | Status: AC

## 2011-10-21 NOTE — Telephone Encounter (Signed)
Daughter called, pt has body aches febrile.   Icalled tamiflu into pharmacy Pt to go to ED if worse

## 2011-10-24 NOTE — Assessment & Plan Note (Signed)
No evidence of Metastatic disease  on PET CT  Case discussed with Dr. Delton Coombes  Plan:  We are referring you to [facility] , Dr Scheryl Darter office for evaluation of your lung mass.  Continue on current regimen.  follow up Dr. Delton Coombes in 2 weeks and As needed   Please contact office for sooner follow up if symptoms do not improve or worsen or seek emergency care

## 2011-10-24 NOTE — Progress Notes (Signed)
Subjective:    Patient ID: Stephanie [name], female    DOB: 1941-02-02, 71 y.o.   MRN: [id number]  HPI 70 y.o. y/o female with a PMH of HTN and hypercholesterolemia presented to the hospital 12/14 with c/o CP. The patient had a "minor" stroke during thanksgiving where a CXR was done and was questionable for a lung mass. CT guided biopsy was done on 12/13 that was not eventful then on 12/14 patient noticed back pain and SOB. She was brought to the ED where a CXR revealed a 10% left sided PTX. PCCM was called to admit. Treated with O2, rest. 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  Marland Kitchen She was discharged on 10/19/11 .  PET scan done today showed no evidence of metastatic disease, positive uptake in mass.  She was seen in consultation by Dr. Arline Asp. 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 She is a never smoker.  The plan was if - if PET NEGATIVE for metastatic disease pt will need to see [facility] Ed[facility] Shell) for consultation for resection. If PET POSITIVE pt will need to see oncology, either [facility] or [facility]) for consultation regvarding ?chemo rx.   I reviewed the results of the PET scan with her and her daughters.  She is doing well since discharge yesterday with no chest pain and or dyspnea.     Review of Systems Constitutional:   No  weight loss, night sweats,  Fevers, chills, fatigue, or  lassitude.  HEENT:   No headaches,  Difficulty swallowing,  Tooth/dental problems, or  Sore throat,                No sneezing, itching, ear ache, nasal congestion, post nasal drip,   CV:  No chest pain,  Orthopnea, PND, swelling in lower extremities, anasarca, dizziness, palpitations, syncope.   GI  No heartburn, indigestion, abdominal pain, nausea, vomiting, diarrhea, change in bowel habits, loss of appetite, bloody stools.   Resp: No shortness of breath with exertion or at rest.  No excess mucus, no productive cough,  No non-productive cough,   No coughing up of blood.  No change in color of mucus.  No wheezing.  No chest wall deformity  Skin: no rash or lesions.  GU: no dysuria, change in color of urine, no urgency or frequency.  No flank pain, no hematuria   MS:  No joint pain or swelling.  No decreased range of motion.  No back pain.  Psych:  No change in mood or affect. No depression or anxiety.  No memory loss.          Objective:   Physical Exam GEN: A/Ox3; pleasant , NAD, well nourished   HEENT:  [place]/AT,  EACs-clear, TMs-wnl, NOSE-clear, THROAT-clear, no lesions, no postnasal drip or exudate noted.   NECK:  Supple w/ fair ROM; no JVD; normal carotid impulses w/o bruits; no thyromegaly or nodules palpated; no lymphadenopathy.  RESP  Clear  P & A; w/o, wheezes/ rales/ or rhonchi.no accessory muscle use, no dullness to percussion  CARD:  RRR, no m/r/g  , no peripheral edema, pulses intact, no cyanosis or clubbing.  GI:   Soft & nt; nml bowel sounds; no organomegaly or masses detected.  Musco: Warm bil, no deformities or joint swelling noted.   Neuro: alert, no focal deficits noted.    Skin: Warm, no lesions or rashes     PET 10/19/11  Low metabolic activity associated with the left upper lobe  ground-glass mass-like lesion.  This is consistent with the biopsy  diagnosis of well differentiated adenocarcinoma.  2. No clear evidence of mediastinal metastasis. Prevascular lymph  node has low metabolic activity is likely reactive.  3. No evidence of distant metastasis.       Assessment & Plan:

## 2011-10-27 ENCOUNTER — Telehealth: Payer: Self-pay | Admitting: Emergency Medicine

## 2011-10-27 NOTE — Telephone Encounter (Signed)
Per pt's chart, referral request was faxed to Dr Scheryl Darter on day of appt - 12.20.12.    Called spoke with Selena Batten, advised that she should call Dr Scheryl Darter office to check on the status of the referral and gave her the office number.  Kim okay with this.  Nothing further needed, will sign off.

## 2011-11-04 ENCOUNTER — Ambulatory Visit (INDEPENDENT_AMBULATORY_CARE_PROVIDER_SITE_OTHER): Payer: Medicare PPO | Admitting: Emergency Medicine

## 2011-11-04 ENCOUNTER — Encounter: Payer: Self-pay | Admitting: Emergency Medicine

## 2011-11-04 ENCOUNTER — Encounter: Payer: Self-pay | Admitting: *Deleted

## 2011-11-04 VITALS — BP 140/100 | HR 56 | Temp 97.6°F | Ht 67.0 in | Wt 176.2 lb

## 2011-11-04 DIAGNOSIS — C801 Malignant (primary) neoplasm, unspecified: Secondary | ICD-10-CM

## 2011-11-04 NOTE — Progress Notes (Signed)
  Subjective:    Patient ID: Stephanie [name], female    DOB: 12-30-1940, 71 y.o.   MRN: [id number]  HPI 71 y.o. y/o female with a PMH of HTN and hypercholesterolemia presented to the hospital 12/14 with c/o CP. The patient had a "minor" stroke during thanksgiving where a CXR was done and was questionable for a lung mass. CT guided biopsy was done on 12/13 that was not eventful then on 12/14 patient noticed back pain and SOB. She was brought to the ED where a CXR revealed a 10% left sided PTX. PCCM was called to admit. Treated with O2, rest. 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  Marland Kitchen She was discharged on 10/19/11 .  PET scan done today showed no evidence of metastatic disease, positive uptake in mass.  She was seen in consultation by Dr. Arline Asp. 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 She is a never smoker.  The plan was if - if PET NEGATIVE for metastatic disease pt will need to see [facility] Shell) for consultation for resection. If PET POSITIVE pt will need to see oncology, either [facility] or [facility]) for consultation regvarding ?chemo rx.   I reviewed the results of the PET scan with her and her daughters.  She is doing well since discharge yesterday with no chest pain and or dyspnea.   ROV 11/03/11 -- Follows up following her lung bx, showed adenoCA. PET without evidence for distant disease. Planning for [facility] appt next Thursday. She has had MRI in 11/12 that was negative. She wants to go back to work. She feels baseline.      Objective:   Physical Exam GEN: A/Ox3; pleasant , NAD, well nourished   HEENT:  [place]/AT,  EACs-clear, TMs-wnl, NOSE-clear, THROAT-clear, no lesions, no postnasal drip or exudate noted.   NECK:  Supple w/ fair ROM; no JVD; normal carotid impulses w/o bruits; no thyromegaly or nodules palpated; no lymphadenopathy.  RESP  Clear  P & A; w/o, wheezes/ rales/ or rhonchi.no accessory muscle use, no dullness to  percussion  CARD:  RRR, no m/r/g  , no peripheral edema, pulses intact, no cyanosis or clubbing.  GI:   Soft & nt; nml bowel sounds; no organomegaly or masses detected.  Musco: Warm bil, no deformities or joint swelling noted.   Neuro: alert, no focal deficits noted.    Skin: Warm, no lesions or rashes     PET 10/19/11  Low metabolic activity associated with the left upper lobe  ground-glass mass-like lesion. This is consistent with the biopsy  diagnosis of well differentiated adenocarcinoma.  2. No clear evidence of mediastinal metastasis. Prevascular lymph  node has low metabolic activity is likely reactive.  3. No evidence of distant metastasis.       Assessment & Plan:  Adenocarcinoma She is to go to [facility] on Thursday. Likely a surgical candidate. No other issues at this time.

## 2011-11-04 NOTE — Assessment & Plan Note (Signed)
She is to go to [facility] on Thursday. Likely a surgical candidate. No other issues at this time.

## 2011-11-04 NOTE — Patient Instructions (Signed)
Please attend your [facility] appointment on Thursday 11/10/11.  You may return to limited duty work (no more than 5 hours a day)

## 2011-11-07 ENCOUNTER — Encounter: Payer: Medicare PPO | Admitting: Thoracic Surgery (Cardiothoracic Vascular Surgery)

## 2011-11-10 ENCOUNTER — Other Ambulatory Visit: Payer: Self-pay | Admitting: Thoracic Surgery (Cardiothoracic Vascular Surgery)

## 2011-11-10 ENCOUNTER — Institutional Professional Consult (permissible substitution) (INDEPENDENT_AMBULATORY_CARE_PROVIDER_SITE_OTHER): Payer: Medicare PPO | Admitting: Thoracic Surgery (Cardiothoracic Vascular Surgery)

## 2011-11-10 VITALS — BP 180/87 | HR 60 | Resp 20 | Ht 67.0 in | Wt 165.0 lb

## 2011-11-10 DIAGNOSIS — D381 Neoplasm of uncertain behavior of trachea, bronchus and lung: Secondary | ICD-10-CM

## 2011-11-10 DIAGNOSIS — C3490 Malignant neoplasm of unspecified part of unspecified bronchus or lung: Secondary | ICD-10-CM

## 2011-11-10 DIAGNOSIS — C801 Malignant (primary) neoplasm, unspecified: Secondary | ICD-10-CM

## 2011-11-10 NOTE — Progress Notes (Signed)
PCP is FUSCO,LAWRENCE J., MD, MD Referring Provider is Byrum, Robert S., MD  Chief Complaint  Patient presents with  . Lung Cancer    MTOC/ Lung cancer, possible surgical candidate, BX 10/13/11, PET 10/20/11    HPI: 71-year-old woman hospitalized in November with a stroke which caused some speech difficulties and right hand weakness. During admission a chest x-ray question of a pneumonia versus a mass in the left upper lobe. Stephanie [name] had a CT scan which confirmed a mass and also showed a questionably enlarged AP window nodes. Stephanie [name] subsequently had a PET/CT which showed hypermetabolic uptake in the left upper lobe mass/infiltrate, the AP window node was not hypermetabolic in relation to the surrounding mediastinal uptake. Successfully had a transthoracic needle biopsy which showed a well-differentiated adenocarcinoma. 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   Past Medical History  Diagnosis Date  . Hypertension   . Arthritis   . CVA (cerebral infarction)   . Stroke   . Hypercholesteremia     Past Surgical History  Procedure Date  . No past surgeries   . Lung biopsy 10/13/11    left    Family History  Problem Relation Age of Onset  . Stroke Mother   . Stroke Sister     Social History History  Substance Use Topics  . Smoking status: Never Smoker   . Smokeless tobacco: Never Used  . Alcohol Use: No    Current Outpatient Prescriptions  Medication Sig Dispense Refill  . aspirin 325 MG tablet Take 1 tablet (325 mg total) by mouth daily.  30 tablet  0  . atenolol (TENORMIN) 50 MG tablet Take 50 mg by mouth daily at 8 pm.        . lisinopril (PRINIVIL,ZESTRIL) 5 MG tablet Take 1 tablet (5 mg total) by mouth daily.  30 tablet  0  . simvastatin (ZOCOR) 20 MG tablet Take 1 tablet (20 mg total) by mouth daily at 6 PM.  30 tablet  0    No Known Allergies  Review of Systems    Constitutional: Negative for fever, activity change, appetite change and unexpected weight change.       Back at work  Respiratory: Negative for cough, chest tightness, shortness of breath, wheezing and stridor.   Cardiovascular: Negative for chest pain and leg swelling.  Genitourinary: Negative.   Neurological: Positive for speech difficulty and weakness (right grip weak after stroke in November).  Hematological: Negative for adenopathy. Does not bruise/bleed easily.  All other systems reviewed and are negative.    BP 180/87  Pulse 60  Resp 20  Ht 5' 7" (1.702 m)  Wt 165 lb (74.844 kg)  BMI 25.84 kg/m2  SpO2 98% Physical Exam  Vitals reviewed. Constitutional: Stephanie [name] is oriented to person, place, and time. Stephanie [name] appears well-developed and well-nourished.       obese  HENT:  Head: Normocephalic and atraumatic.  Eyes: EOM are normal. Pupils are equal, round, and reactive to light.  Neck: No JVD present. No tracheal deviation present. No thyromegaly present.  Cardiovascular: Normal rate, regular rhythm and normal heart sounds.  Exam reveals no gallop and no friction rub.   No murmur heard. Pulmonary/Chest: Effort normal and breath sounds normal. Stephanie [name] has no wheezes. Stephanie [name] has no rales.  Abdominal: Soft. There is no tenderness.  Lymphadenopathy:    Stephanie [name] has no cervical adenopathy.    Neurological: Stephanie [name] is alert and oriented to person, place, and time.  Skin: Skin is warm and dry.     Diagnostic Tests: CT of chest and PET/CT reviewed findings as previously noted  Pulmonary function testing revealed an FVC of 1.48 (43% predicted), FEV1 was 1.43L (55% predicted), diffusion capacity was only 36% of predicted., But the DL/VA was 83% predicted. It should be noted that the pulmonary function testing was done while Stephanie [name] was in the hospital, with a pneumothorax after Stephanie CT-guided biopsy.  Impression: 71-year-old woman with a 3 x 4 cm infiltrative left upper lobe mass, which by biopsy is a  well-differentiated adenocarcinoma. 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 Given the appearance of the mass on CT and this infiltrative nature and its size and location, it is unlikely that a wedge resection would provide an adequate margin. In all likelihood Stephanie [name] would require a lobectomy or least a segmentectomy for surgery to be beneficial.  I long discussion with Stephanie [name] and Stephanie [name], and Stephanie regarding the findings and treatment options. They understand that surgery is the best treatment for localized lung cancer, and understand the reasoning behind anatomic resection. I recommended that Stephanie [name] have a left VATS, left upper lobectomy or segmentectomy. I have discussed with the patient and Stephanie [name] the general nature of the procedure, need for general anesthesia,and incisions to be used. I have discussed the expected hospital stay, overall recovery and short and long term outcomes. They understand the risks include but are not limited to death, stroke, MI, DVT/PE, bleeding, possible need for transfusion, infections,other organ system dysfunction including respiratory, renal, or GI complications.   As well as discussing the risks of the procedure with Stephanie [name] he became very upset and tearful, and was really unable to continue the conversation. Stephanie [name] wishes to have additional time to think over Stephanie options, prior to making a decision as to whether to proceed with surgery.  Plan: I would like to repeat Stephanie pulmonary function testing and other Stephanie pneumothorax is resolved, to see if that has any in fact on the previous results. And also check a room air blood gas at that time.  We then to make a final determination is Stephanie ability to tolerate a lobectomy, should Stephanie [name] decide to proceed with surgery.    

## 2011-11-11 ENCOUNTER — Other Ambulatory Visit (HOSPITAL_COMMUNITY): Payer: Self-pay | Admitting: Radiology

## 2011-11-11 ENCOUNTER — Ambulatory Visit (HOSPITAL_COMMUNITY)
Admission: RE | Admit: 2011-11-11 | Discharge: 2011-11-11 | Disposition: A | Discharge status: Home / Self Care | Payer: Medicare PPO | Source: Ambulatory Visit | Attending: Thoracic Surgery (Cardiothoracic Vascular Surgery) | Admitting: Thoracic Surgery (Cardiothoracic Vascular Surgery)

## 2011-11-11 DIAGNOSIS — J984 Other disorders of lung: Secondary | ICD-10-CM | POA: Insufficient documentation

## 2011-11-11 DIAGNOSIS — D381 Neoplasm of uncertain behavior of trachea, bronchus and lung: Secondary | ICD-10-CM

## 2011-11-11 LAB — BLOOD GAS, ARTERIAL
Acid-Base Excess: 0.7 mmol/L (ref 0.0–2.0)
Bicarbonate: 24.6 mEq/L — ABNORMAL HIGH (ref 20.0–24.0)
O2 Saturation: 97.3 %
Patient temperature: 98.6
TCO2: 25.8 mmol/L (ref 0–100)

## 2011-11-11 MED ORDER — ALBUTEROL SULFATE (5 MG/ML) 0.5% IN NEBU
2.5000 mg | INHALATION_SOLUTION | Freq: Once | RESPIRATORY_TRACT | Status: AC
  Administered 2011-11-11: 2.5 mg via RESPIRATORY_TRACT

## 2011-11-14 ENCOUNTER — Ambulatory Visit (INDEPENDENT_AMBULATORY_CARE_PROVIDER_SITE_OTHER): Payer: Medicare PPO | Admitting: Thoracic Surgery (Cardiothoracic Vascular Surgery)

## 2011-11-14 ENCOUNTER — Other Ambulatory Visit: Payer: Self-pay

## 2011-11-14 ENCOUNTER — Encounter: Payer: Self-pay | Admitting: Thoracic Surgery (Cardiothoracic Vascular Surgery)

## 2011-11-14 VITALS — BP 167/79 | HR 67 | Resp 20 | Ht 66.0 in | Wt 165.0 lb

## 2011-11-14 DIAGNOSIS — C3490 Malignant neoplasm of unspecified part of unspecified bronchus or lung: Secondary | ICD-10-CM

## 2011-11-14 NOTE — Progress Notes (Signed)
Patient ID: [name], female   DOB: [birth year], 71 y.o.   MRN: [id number] Stephanie [name] returns today to continue our discussion from last week regarding treatment of her left upper lobe mass. This is a well-differentiated adenocarcinoma on biopsy. I recommended that she undergo left VATS, and segmentectomy or more probably lobectomy for treatment of that mass. She was very emotional, and reluctant to have surgery. She wished to think about it, talk to her family and meet again today.  She did have repeat pulmonary function testing done on 11/11/2011  FEV1 1.89, 73% predicted, no improvement with bronchodilator FVC 2.00, 59% predicted FEV1/FEC 94, 124% predicted, [place] restrictive disease DLCO was 54% predicted Room air ABG 7.42, PCO2 39, PO2 86.  Given the pulmonary function test results, I do think she could tolerate a lobectomy with adequate pulmonary reserve.  I once again discussed with Stephanie [name] and her family the diagnosis and recommended treatment. They understand that surgery is the only good chance for cure.   I have discussed with the patient the general nature of the procedure, need for general anesthesia,and incisions to be used. I have discussed the expected hospital stay, overall recovery and short and long term outcomes. She understands the risks include but are not limited to death, stroke, MI, DVT/PE, bleeding, possible need for transfusion, infections, other organ system dysfunction including respiratory, renal, or GI complications. She understands and accepts these risks and agrees to proceed.  She and her family are requesting surgery on Friday, January 18.

## 2011-11-16 ENCOUNTER — Other Ambulatory Visit: Payer: Self-pay

## 2011-11-16 ENCOUNTER — Encounter (HOSPITAL_COMMUNITY): Payer: Self-pay

## 2011-11-16 ENCOUNTER — Ambulatory Visit (HOSPITAL_COMMUNITY): Admission: RE | Admit: 2011-11-16 | Payer: Medicare HMO | Source: Ambulatory Visit

## 2011-11-16 ENCOUNTER — Encounter (HOSPITAL_COMMUNITY)
Admission: RE | Admit: 2011-11-16 | Discharge: 2011-11-16 | Disposition: A | Discharge status: Home / Self Care | Payer: Medicare HMO | Source: Ambulatory Visit | Attending: Thoracic Surgery (Cardiothoracic Vascular Surgery) | Admitting: Thoracic Surgery (Cardiothoracic Vascular Surgery)

## 2011-11-16 ENCOUNTER — Ambulatory Visit (HOSPITAL_COMMUNITY)
Admission: RE | Admit: 2011-11-16 | Discharge: 2011-11-16 | Disposition: A | Discharge status: Home / Self Care | Payer: Medicare HMO | Source: Ambulatory Visit | Attending: Thoracic Surgery (Cardiothoracic Vascular Surgery) | Admitting: Thoracic Surgery (Cardiothoracic Vascular Surgery)

## 2011-11-16 ENCOUNTER — Encounter (HOSPITAL_COMMUNITY): Payer: Self-pay | Admitting: Pharmacy Technician

## 2011-11-16 DIAGNOSIS — Z01818 Encounter for other preprocedural examination: Secondary | ICD-10-CM | POA: Insufficient documentation

## 2011-11-16 DIAGNOSIS — Z01812 Encounter for preprocedural laboratory examination: Secondary | ICD-10-CM | POA: Insufficient documentation

## 2011-11-16 DIAGNOSIS — Z01810 Encounter for preprocedural cardiovascular examination: Secondary | ICD-10-CM | POA: Insufficient documentation

## 2011-11-16 HISTORY — DX: Other nonspecific abnormal finding of lung field: R91.8

## 2011-11-16 HISTORY — DX: Transient cerebral ischemic attack, unspecified: G45.9

## 2011-11-16 LAB — COMPREHENSIVE METABOLIC PANEL
AST: 24 U/L (ref 0–37)
Albumin: 3.6 g/dL (ref 3.5–5.2)
Alkaline Phosphatase: 75 U/L (ref 39–117)
BUN: 18 mg/dL (ref 6–23)
Chloride: 104 mEq/L (ref 96–112)
Potassium: 4.5 mEq/L (ref 3.5–5.1)
Sodium: 140 mEq/L (ref 135–145)
Total Bilirubin: 0.5 mg/dL (ref 0.3–1.2)
Total Protein: 7 g/dL (ref 6.0–8.3)

## 2011-11-16 LAB — PROTIME-INR: Prothrombin Time: 14 seconds (ref 11.6–15.2)

## 2011-11-16 LAB — URINALYSIS, ROUTINE W REFLEX MICROSCOPIC
Glucose, UA: NEGATIVE mg/dL
Leukocytes, UA: NEGATIVE
Protein, ur: 30 mg/dL — AB
Specific Gravity, Urine: 1.021 (ref 1.005–1.030)
Urobilinogen, UA: 1 mg/dL (ref 0.0–1.0)

## 2011-11-16 LAB — CBC
Hemoglobin: 13.6 g/dL (ref 12.0–15.0)
MCHC: 34.6 g/dL (ref 30.0–36.0)
Platelets: 165 10*3/uL (ref 150–400)
RBC: 4.16 MIL/uL (ref 3.87–5.11)
RDW: 12.4 % (ref 11.5–15.5)

## 2011-11-16 LAB — URINE MICROSCOPIC-ADD ON

## 2011-11-16 LAB — TYPE AND SCREEN
ABO/RH(D): O POS
Antibody Screen: NEGATIVE

## 2011-11-16 NOTE — Pre-Procedure Instructions (Signed)
20 Stephanie [name]  11/16/2011   Your procedure is scheduled on:  Fri, Jan 18 @ 0730  Report to [facility] at 0530 AM.  Call this number if you have problems the morning of surgery: [phone]   Remember:   Do not eat food:After Midnight.  May have clear liquids: up to 4 Hours before arrival.(until 1:30am)  Clear liquids include soda, tea, black coffee, apple or grape juice, broth.  Take these medicines the morning of surgery with A SIP OF WATER   Do not wear jewelry, make-up or nail polish.  Do not wear lotions, powders, or perfumes. You may wear deodorant.  Do not shave 48 hours prior to surgery.  Do not bring valuables to the hospital.  Contacts, dentures or bridgework may not be worn into surgery.  Leave suitcase in the car. After surgery it may be brought to your room.  For patients admitted to the hospital, checkout time is 11:00 AM the day of discharge.   Patients discharged the day of surgery will not be allowed to drive home.  Name and phone number of your driver:   Special Instructions: CHG Shower Use Special Wash: 1/2 bottle night before surgery and 1/2 bottle morning of surgery.   Please read over the following fact sheets that you were given: Pain Booklet, Coughing and Deep Breathing, Blood Transfusion Information, MRSA Information and Surgical Site Infection Prevention

## 2011-11-16 NOTE — Progress Notes (Signed)
Pt doesn't have a cardiologist;maintained by medical MD-Dr.Golding for HTN/Hyperlipidemia  Echo done 09/19/11

## 2011-11-17 MED ORDER — DEXTROSE 5 % IV SOLN
1.5000 g | INTRAVENOUS | Status: AC
  Administered 2011-11-18: 1.5 g via INTRAVENOUS
  Filled 2011-11-17: qty 1.5

## 2011-11-18 ENCOUNTER — Encounter (HOSPITAL_COMMUNITY): Payer: Self-pay | Admitting: *Deleted

## 2011-11-18 ENCOUNTER — Encounter (HOSPITAL_COMMUNITY): Payer: Self-pay | Admitting: Certified Registered"

## 2011-11-18 ENCOUNTER — Ambulatory Visit (HOSPITAL_COMMUNITY): Payer: Medicare HMO | Admitting: Certified Registered"

## 2011-11-18 ENCOUNTER — Other Ambulatory Visit: Payer: Self-pay | Admitting: Thoracic Surgery (Cardiothoracic Vascular Surgery)

## 2011-11-18 ENCOUNTER — Encounter (HOSPITAL_COMMUNITY)
Admission: RE | Disposition: A | Discharge status: Home / Self Care | Payer: Self-pay | Source: Ambulatory Visit | Attending: Thoracic Surgery (Cardiothoracic Vascular Surgery)

## 2011-11-18 ENCOUNTER — Ambulatory Visit (HOSPITAL_COMMUNITY): Payer: Medicare HMO

## 2011-11-18 ENCOUNTER — Inpatient Hospital Stay (HOSPITAL_COMMUNITY)
Admission: RE | Admit: 2011-11-18 | Discharge: 2011-11-23 | DRG: 164 | Disposition: A | Discharge status: Home / Self Care | Payer: Medicare HMO | Source: Ambulatory Visit | Attending: Thoracic Surgery (Cardiothoracic Vascular Surgery) | Admitting: Thoracic Surgery (Cardiothoracic Vascular Surgery)

## 2011-11-18 DIAGNOSIS — Y9210 Unspecified residential institution as the place of occurrence of the external cause: Secondary | ICD-10-CM | POA: Diagnosis not present

## 2011-11-18 DIAGNOSIS — C3410 Malignant neoplasm of upper lobe, unspecified bronchus or lung: Principal | ICD-10-CM | POA: Diagnosis present

## 2011-11-18 DIAGNOSIS — D381 Neoplasm of uncertain behavior of trachea, bronchus and lung: Secondary | ICD-10-CM

## 2011-11-18 DIAGNOSIS — Z8673 Personal history of transient ischemic attack (TIA), and cerebral infarction without residual deficits: Secondary | ICD-10-CM

## 2011-11-18 DIAGNOSIS — Z823 Family history of stroke: Secondary | ICD-10-CM

## 2011-11-18 DIAGNOSIS — I4891 Unspecified atrial fibrillation: Secondary | ICD-10-CM | POA: Diagnosis not present

## 2011-11-18 DIAGNOSIS — I519 Heart disease, unspecified: Secondary | ICD-10-CM | POA: Diagnosis not present

## 2011-11-18 DIAGNOSIS — E669 Obesity, unspecified: Secondary | ICD-10-CM | POA: Diagnosis present

## 2011-11-18 DIAGNOSIS — Z79899 Other long term (current) drug therapy: Secondary | ICD-10-CM

## 2011-11-18 DIAGNOSIS — I10 Essential (primary) hypertension: Secondary | ICD-10-CM | POA: Diagnosis present

## 2011-11-18 DIAGNOSIS — Y836 Removal of other organ (partial) (total) as the cause of abnormal reaction of the patient, or of later complication, without mention of misadventure at the time of the procedure: Secondary | ICD-10-CM | POA: Diagnosis not present

## 2011-11-18 DIAGNOSIS — E7800 Pure hypercholesterolemia, unspecified: Secondary | ICD-10-CM | POA: Diagnosis present

## 2011-11-18 DIAGNOSIS — Z7982 Long term (current) use of aspirin: Secondary | ICD-10-CM

## 2011-11-18 HISTORY — PX: VIDEO ASSISTED THORACOSCOPY: SHX5073

## 2011-11-18 HISTORY — PX: LOBECTOMY: SHX5089

## 2011-11-18 LAB — GLUCOSE, CAPILLARY: Glucose-Capillary: 168 mg/dL — ABNORMAL HIGH (ref 70–99)

## 2011-11-18 SURGERY — VIDEO ASSISTED THORACOSCOPY
Anesthesia: General | Site: Chest | Laterality: Left

## 2011-11-18 SURGERY — VIDEO ASSISTED THORACOSCOPY
Anesthesia: General | Site: Chest | Laterality: Left | Wound class: Clean Contaminated

## 2011-11-18 MED ORDER — TRAMADOL HCL 50 MG PO TABS
50.0000 mg | ORAL_TABLET | Freq: Four times a day (QID) | ORAL | Status: DC | PRN
  Administered 2011-11-19 – 2011-11-22 (×4): 100 mg via ORAL
  Filled 2011-11-18 (×4): qty 2

## 2011-11-18 MED ORDER — BUPIVACAINE 0.5 % ON-Q PUMP SINGLE CATH 400 ML: 400.0000 mL | INJECTION | Status: DC

## 2011-11-18 MED ORDER — PHENYLEPHRINE HCL 10 MG/ML IJ SOLN
10.0000 mg | INTRAVENOUS | Status: DC | PRN
  Administered 2011-11-18: 80 ug/min via INTRAVENOUS

## 2011-11-18 MED ORDER — ACETAMINOPHEN 10 MG/ML IV SOLN
1000.0000 mg | Freq: Four times a day (QID) | INTRAVENOUS | Status: AC
  Administered 2011-11-18 – 2011-11-19 (×4): 1000 mg via INTRAVENOUS
  Filled 2011-11-18 (×4): qty 100

## 2011-11-18 MED ORDER — FENTANYL 10 MCG/ML IV SOLN
INTRAVENOUS | Status: DC
  Administered 2011-11-18: 160 ug via INTRAVENOUS
  Administered 2011-11-19: 20 ug via INTRAVENOUS
  Administered 2011-11-19: 30 ug via INTRAVENOUS
  Administered 2011-11-19: 50 ug via INTRAVENOUS
  Administered 2011-11-19: 10 ug via INTRAVENOUS
  Administered 2011-11-19: 30 ug via INTRAVENOUS
  Administered 2011-11-20: 20 ug via INTRAVENOUS
  Administered 2011-11-20 (×2): 40 ug via INTRAVENOUS
  Administered 2011-11-20: 30 ug via INTRAVENOUS
  Filled 2011-11-18 (×2): qty 50

## 2011-11-18 MED ORDER — ALBUTEROL SULFATE HFA 108 (90 BASE) MCG/ACT IN AERS
2.0000 | INHALATION_SPRAY | Freq: Four times a day (QID) | RESPIRATORY_TRACT | Status: AC
  Administered 2011-11-18 – 2011-11-20 (×5): 2 via RESPIRATORY_TRACT
  Filled 2011-11-18: qty 6.7

## 2011-11-18 MED ORDER — ROCURONIUM BROMIDE 100 MG/10ML IV SOLN
INTRAVENOUS | Status: DC | PRN
  Administered 2011-11-18: 50 mg via INTRAVENOUS

## 2011-11-18 MED ORDER — ATENOLOL 50 MG PO TABS
50.0000 mg | ORAL_TABLET | Freq: Every day | ORAL | Status: DC
  Administered 2011-11-19 – 2011-11-22 (×5): 50 mg via ORAL
  Filled 2011-11-18 (×5): qty 1

## 2011-11-18 MED ORDER — DIPHENHYDRAMINE HCL 50 MG/ML IJ SOLN: 12.5000 mg | Freq: Four times a day (QID) | INTRAMUSCULAR | Status: DC | PRN

## 2011-11-18 MED ORDER — HYDROMORPHONE HCL PF 1 MG/ML IJ SOLN
0.2500 mg | INTRAMUSCULAR | Status: DC | PRN
  Administered 2011-11-18 (×3): 0.5 mg via INTRAVENOUS

## 2011-11-18 MED ORDER — BISACODYL 5 MG PO TBEC
10.0000 mg | DELAYED_RELEASE_TABLET | Freq: Every day | ORAL | Status: DC
  Administered 2011-11-18 – 2011-11-22 (×5): 10 mg via ORAL
  Filled 2011-11-18 (×5): qty 2

## 2011-11-18 MED ORDER — SUFENTANIL CITRATE 50 MCG/ML IV SOLN
INTRAVENOUS | Status: DC | PRN
  Administered 2011-11-18: 10 ug via INTRAVENOUS
  Administered 2011-11-18: 15 ug via INTRAVENOUS
  Administered 2011-11-18: 10 ug via INTRAVENOUS
  Administered 2011-11-18: 5 ug via INTRAVENOUS
  Administered 2011-11-18 (×4): 10 ug via INTRAVENOUS

## 2011-11-18 MED ORDER — MIDAZOLAM HCL 5 MG/5ML IJ SOLN
INTRAMUSCULAR | Status: DC | PRN
  Administered 2011-11-18: 1.5 mg via INTRAVENOUS

## 2011-11-18 MED ORDER — DROPERIDOL 2.5 MG/ML IJ SOLN: 0.6250 mg | INTRAMUSCULAR | Status: DC | PRN

## 2011-11-18 MED ORDER — 0.9 % SODIUM CHLORIDE (POUR BTL) OPTIME
TOPICAL | Status: DC | PRN
  Administered 2011-11-18: 2000 mL

## 2011-11-18 MED ORDER — INSULIN ASPART 100 UNIT/ML ~~LOC~~ SOLN
0.0000 [IU] | Freq: Four times a day (QID) | SUBCUTANEOUS | Status: DC
  Administered 2011-11-19 (×3): 2 [IU] via SUBCUTANEOUS
  Filled 2011-11-18: qty 3

## 2011-11-18 MED ORDER — FENTANYL 10 MCG/ML IV SOLN
INTRAVENOUS | Status: DC
  Administered 2011-11-18: 13:00:00 via INTRAVENOUS
  Filled 2011-11-18 (×2): qty 50

## 2011-11-18 MED ORDER — SIMVASTATIN 20 MG PO TABS
20.0000 mg | ORAL_TABLET | Freq: Every day | ORAL | Status: DC
  Administered 2011-11-19 – 2011-11-22 (×4): 20 mg via ORAL
  Filled 2011-11-18 (×5): qty 1

## 2011-11-18 MED ORDER — SENNOSIDES-DOCUSATE SODIUM 8.6-50 MG PO TABS
1.0000 | ORAL_TABLET | Freq: Every evening | ORAL | Status: DC | PRN
  Administered 2011-11-20: 1 via ORAL
  Filled 2011-11-18: qty 1

## 2011-11-18 MED ORDER — HYDROMORPHONE HCL PF 1 MG/ML IJ SOLN
INTRAMUSCULAR | Status: AC
  Filled 2011-11-18: qty 1

## 2011-11-18 MED ORDER — OXYCODONE HCL 5 MG PO TABS: 5.0000 mg | ORAL_TABLET | ORAL | Status: AC | PRN

## 2011-11-18 MED ORDER — DEXTROSE 5 % IV SOLN
1.5000 g | Freq: Two times a day (BID) | INTRAVENOUS | Status: AC
  Administered 2011-11-18 – 2011-11-19 (×2): 1.5 g via INTRAVENOUS
  Filled 2011-11-18 (×2): qty 1.5

## 2011-11-18 MED ORDER — POTASSIUM CHLORIDE 10 MEQ/50ML IV SOLN
10.0000 meq | Freq: Every day | INTRAVENOUS | Status: DC | PRN
  Administered 2011-11-21 (×3): 10 meq via INTRAVENOUS
  Filled 2011-11-18 (×3): qty 50

## 2011-11-18 MED ORDER — PHENYLEPHRINE HCL 10 MG/ML IJ SOLN
INTRAMUSCULAR | Status: DC | PRN
  Administered 2011-11-18: 80 ug via INTRAVENOUS

## 2011-11-18 MED ORDER — GLYCOPYRROLATE 0.2 MG/ML IJ SOLN
INTRAMUSCULAR | Status: DC | PRN
  Administered 2011-11-18: .5 mg via INTRAVENOUS
  Administered 2011-11-18: 0.2 mg via INTRAVENOUS

## 2011-11-18 MED ORDER — ASPIRIN 325 MG PO TABS
325.0000 mg | ORAL_TABLET | Freq: Every day | ORAL | Status: DC
  Administered 2011-11-19 – 2011-11-23 (×5): 325 mg via ORAL
  Filled 2011-11-18 (×5): qty 1

## 2011-11-18 MED ORDER — BUPIVACAINE ON-Q PAIN PUMP (FOR ORDER SET NO CHG)
INJECTION | Status: AC
  Filled 2011-11-18: qty 1

## 2011-11-18 MED ORDER — OXYCODONE-ACETAMINOPHEN 5-325 MG PO TABS
1.0000 | ORAL_TABLET | ORAL | Status: DC | PRN
  Administered 2011-11-19 – 2011-11-21 (×5): 2 via ORAL
  Filled 2011-11-18 (×5): qty 2

## 2011-11-18 MED ORDER — ONDANSETRON HCL 4 MG/2ML IJ SOLN: 4.0000 mg | Freq: Four times a day (QID) | INTRAMUSCULAR | Status: DC | PRN

## 2011-11-18 MED ORDER — BUPIVACAINE 0.5 % ON-Q PUMP SINGLE CATH 400 ML
400.0000 mL | INJECTION | Status: AC
  Administered 2011-11-18: 400 mL
  Filled 2011-11-18 (×2): qty 400

## 2011-11-18 MED ORDER — LISINOPRIL 5 MG PO TABS
5.0000 mg | ORAL_TABLET | Freq: Every day | ORAL | Status: DC
  Administered 2011-11-18 – 2011-11-23 (×6): 5 mg via ORAL
  Filled 2011-11-18 (×6): qty 1

## 2011-11-18 MED ORDER — NALOXONE HCL 0.4 MG/ML IJ SOLN: 0.4000 mg | INTRAMUSCULAR | Status: DC | PRN

## 2011-11-18 MED ORDER — DEXTROSE 5 % IV SOLN
INTRAVENOUS | Status: DC | PRN
  Administered 2011-11-18: 08:00:00 via INTRAVENOUS

## 2011-11-18 MED ORDER — OXYCODONE-ACETAMINOPHEN 5-325 MG PO TABS: 1.0000 | ORAL_TABLET | ORAL | Status: DC | PRN

## 2011-11-18 MED ORDER — SODIUM CHLORIDE 0.9 % IJ SOLN: 9.0000 mL | INTRAMUSCULAR | Status: DC | PRN

## 2011-11-18 MED ORDER — HYDROMORPHONE HCL PF 1 MG/ML IJ SOLN
INTRAMUSCULAR | Status: AC
  Administered 2011-11-18: 0.5 mg via INTRAVENOUS
  Filled 2011-11-18: qty 1

## 2011-11-18 MED ORDER — LACTATED RINGERS IV SOLN
INTRAVENOUS | Status: DC | PRN
  Administered 2011-11-18: 07:00:00 via INTRAVENOUS

## 2011-11-18 MED ORDER — VECURONIUM BROMIDE 10 MG IV SOLR
INTRAVENOUS | Status: DC | PRN
  Administered 2011-11-18: 1 mg via INTRAVENOUS
  Administered 2011-11-18: 2 mg via INTRAVENOUS
  Administered 2011-11-18 (×3): 1 mg via INTRAVENOUS

## 2011-11-18 MED ORDER — LACTATED RINGERS IV SOLN
INTRAVENOUS | Status: DC | PRN
  Administered 2011-11-18 (×2): via INTRAVENOUS

## 2011-11-18 MED ORDER — DEXTROSE-NACL 5-0.9 % IV SOLN
INTRAVENOUS | Status: DC
  Administered 2011-11-18 – 2011-11-19 (×2): via INTRAVENOUS
  Administered 2011-11-19: 1000 mL via INTRAVENOUS
  Administered 2011-11-19 – 2011-11-20 (×2): via INTRAVENOUS

## 2011-11-18 MED ORDER — DIPHENHYDRAMINE HCL 12.5 MG/5ML PO ELIX
12.5000 mg | ORAL_SOLUTION | Freq: Four times a day (QID) | ORAL | Status: DC | PRN
  Filled 2011-11-18: qty 5

## 2011-11-18 MED ORDER — PROPOFOL 10 MG/ML IV EMUL
INTRAVENOUS | Status: DC | PRN
  Administered 2011-11-18: 100 mg via INTRAVENOUS

## 2011-11-18 MED ORDER — NEOSTIGMINE METHYLSULFATE 1 MG/ML IJ SOLN
INTRAMUSCULAR | Status: DC | PRN
  Administered 2011-11-18: 3 mg via INTRAVENOUS

## 2011-11-18 MED ORDER — ONDANSETRON HCL 4 MG/2ML IJ SOLN
INTRAMUSCULAR | Status: DC | PRN
  Administered 2011-11-18: 4 mg via INTRAVENOUS

## 2011-11-18 SURGICAL SUPPLY — 71 items
APPLIER CLIP ROT 10 11.4 M/L (STAPLE)
APR CLP MED LRG 11.4X10 (STAPLE)
CANISTER SUCTION 2500CC (MISCELLANEOUS) ×4 IMPLANT
CATH KIT ON Q 5IN SLV (PAIN MANAGEMENT) ×1 IMPLANT
CATH THORACIC 28FR (CATHETERS) ×1 IMPLANT
CATH THORACIC 28FR RT ANG (CATHETERS) ×1 IMPLANT
CATH THORACIC 36FR (CATHETERS) IMPLANT
CATH THORACIC 36FR RT ANG (CATHETERS) IMPLANT
CLIP APPLIE ROT 10 11.4 M/L (STAPLE) IMPLANT
CLIP TI MEDIUM 6 (CLIP) ×2 IMPLANT
CLOTH BEACON ORANGE TIMEOUT ST (SAFETY) ×2 IMPLANT
CONN ST 1/4X3/8  BEN (MISCELLANEOUS) ×1
CONN ST 1/4X3/8 BEN (MISCELLANEOUS) IMPLANT
CONN Y 3/8X3/8X3/8  BEN (MISCELLANEOUS) ×2
CONN Y 3/8X3/8X3/8 BEN (MISCELLANEOUS) ×1 IMPLANT
CONT SPEC 4OZ CLIKSEAL STRL BL (MISCELLANEOUS) ×17 IMPLANT
DRAIN CHANNEL 32F RND 10.7 FF (WOUND CARE) ×1 IMPLANT
DRAPE LAPAROSCOPIC ABDOMINAL (DRAPES) ×2 IMPLANT
DRAPE SLUSH MACHINE 52X66 (DRAPES) IMPLANT
DRAPE SLUSH/WARMER DISC (DRAPES) ×1 IMPLANT
ELECT REM PT RETURN 9FT ADLT (ELECTROSURGICAL) ×2
ELECTRODE REM PT RTRN 9FT ADLT (ELECTROSURGICAL) ×1 IMPLANT
GAUZE SPONGE 4X4 12PLY STRL LF (GAUZE/BANDAGES/DRESSINGS) ×1 IMPLANT
GLOVE EUDERMIC 7 POWDERFREE (GLOVE) ×4 IMPLANT
GOWN PREVENTION PLUS XLARGE (GOWN DISPOSABLE) ×3 IMPLANT
GOWN STRL NON-REIN LRG LVL3 (GOWN DISPOSABLE) ×4 IMPLANT
HANDLE STAPLE ENDO GIA SHORT (STAPLE) ×1
HEMOSTAT SURGICEL 2X14 (HEMOSTASIS) IMPLANT
KIT BASIN OR (CUSTOM PROCEDURE TRAY) ×2 IMPLANT
KIT ROOM TURNOVER OR (KITS) ×2 IMPLANT
KIT SUCTION CATH 14FR (SUCTIONS) ×2 IMPLANT
NS IRRIG 1000ML POUR BTL (IV SOLUTION) ×4 IMPLANT
PACK CHEST (CUSTOM PROCEDURE TRAY) ×2 IMPLANT
PAD ARMBOARD 7.5X6 YLW CONV (MISCELLANEOUS) ×4 IMPLANT
POUCH ENDO CATCH II 15MM (MISCELLANEOUS) ×1 IMPLANT
RELOAD EGIA 45 TAN VASC (STAPLE) ×4 IMPLANT
RELOAD EGIA 60 MED/THCK PURPLE (STAPLE) ×6 IMPLANT
RELOAD STAPLE 60 MED/THCK ART (STAPLE) IMPLANT
SEALANT PROGEL (MISCELLANEOUS) ×1 IMPLANT
SEALANT SURG COSEAL 4ML (VASCULAR PRODUCTS) IMPLANT
SEALANT SURG COSEAL 8ML (VASCULAR PRODUCTS) IMPLANT
SOLUTION ANTI FOG 6CC (MISCELLANEOUS) ×2 IMPLANT
SPECIMEN JAR MEDIUM (MISCELLANEOUS) ×2 IMPLANT
SPONGE GAUZE 4X4 12PLY (GAUZE/BANDAGES/DRESSINGS) ×2 IMPLANT
SPONGE INTESTINAL PEANUT (DISPOSABLE) IMPLANT
STAPLER ENDO GIA 12 SHRT THIN (STAPLE) IMPLANT
STAPLER ENDO GIA 12MM SHORT (STAPLE) ×1 IMPLANT
SUT PROLENE 4 0 RB 1 (SUTURE) ×2
SUT PROLENE 4-0 RB1 .5 CRCL 36 (SUTURE) IMPLANT
SUT SILK  1 MH (SUTURE) ×3
SUT SILK 1 MH (SUTURE) ×2 IMPLANT
SUT SILK 2 0SH CR/8 30 (SUTURE) IMPLANT
SUT SILK 3 0SH CR/8 30 (SUTURE) ×1 IMPLANT
SUT VIC AB 1 CTX 36 (SUTURE) ×4
SUT VIC AB 1 CTX36XBRD ANBCTR (SUTURE) IMPLANT
SUT VIC AB 2-0 CTX 36 (SUTURE) ×2 IMPLANT
SUT VIC AB 2-0 UR6 27 (SUTURE) ×1 IMPLANT
SUT VIC AB 3-0 X1 27 (SUTURE) ×4 IMPLANT
SUT VICRYL 2 TP 1 (SUTURE) ×1 IMPLANT
SWAB COLLECTION DEVICE MRSA (MISCELLANEOUS) IMPLANT
SYSTEM SAHARA CHEST DRAIN ATS (WOUND CARE) ×2 IMPLANT
TAPE CLOTH 4X10 WHT NS (GAUZE/BANDAGES/DRESSINGS) ×2 IMPLANT
TAPE CLOTH SURG 4X10 WHT LF (GAUZE/BANDAGES/DRESSINGS) ×1 IMPLANT
TIP APPLICATOR SPRAY EXTEND 16 (VASCULAR PRODUCTS) ×1 IMPLANT
TOWEL OR 17X24 6PK STRL BLUE (TOWEL DISPOSABLE) ×2 IMPLANT
TOWEL OR 17X26 10 PK STRL BLUE (TOWEL DISPOSABLE) ×2 IMPLANT
TRAP SPECIMEN MUCOUS 40CC (MISCELLANEOUS) IMPLANT
TRAY FOLEY CATH 14FRSI W/METER (CATHETERS) ×2 IMPLANT
TUBE ANAEROBIC SPECIMEN COL (MISCELLANEOUS) IMPLANT
TUNNELER SHEATH ON-Q 11GX8 (MISCELLANEOUS) ×2 IMPLANT
WATER STERILE IRR 1000ML POUR (IV SOLUTION) ×4 IMPLANT

## 2011-11-18 NOTE — Anesthesia Preprocedure Evaluation (Signed)
Anesthesia Evaluation  Patient identified by MRN, date of birth, ID band Patient awake    Reviewed: Allergy & Precautions, H&P , NPO status , Patient's Chart, lab work & pertinent test results  History of Anesthesia Complications Negative for: history of anesthetic complications  Airway Mallampati: II TM Distance: >3 FB Neck ROM: Full    Dental  (+) Edentulous Upper, Edentulous Lower and Dental Advisory Given   Pulmonary  clear to auscultation  Pulmonary exam normal       Cardiovascular hypertension, Pt. on home beta blockers and Pt. on medications Regular Normal- Systolic murmurs    Neuro/Psych TIACVA, No Residual Symptoms    GI/Hepatic negative GI ROS, Neg liver ROS,   Endo/Other  Negative Endocrine ROS  Renal/GU negative Renal ROS     Musculoskeletal   Abdominal   Peds  Hematology negative hematology ROS (+)   Anesthesia Other Findings   Reproductive/Obstetrics                           Anesthesia Physical Anesthesia Plan  ASA: III  Anesthesia Plan: General   Post-op Pain Management:    Induction: Intravenous  Airway Management Planned: Double Lumen EBT  Additional Equipment: CVP and Arterial line  Intra-op Plan:   Post-operative Plan: Possible Post-op intubation/ventilation  Informed Consent: I have reviewed the patients History and Physical, chart, labs and discussed the procedure including the risks, benefits and alternatives for the proposed anesthesia with the patient or authorized representative who has indicated his/her understanding and acceptance.     Plan Discussed with: CRNA and Surgeon  Anesthesia Plan Comments:         Anesthesia Quick Evaluation

## 2011-11-18 NOTE — Anesthesia Procedure Notes (Addendum)
Procedure Name: Intubation Date/Time: 11/18/2011 8:48 AM Performed by: Charm Barges, Lakisha Peyser R Pre-anesthesia Checklist: Patient identified, Emergency Drugs available, Suction available, Patient being monitored and Timeout performed Patient Re-evaluated:Patient Re-evaluated prior to inductionOxygen Delivery Method: Circle System Utilized Preoxygenation: Pre-oxygenation with 100% oxygen Intubation Type: IV induction Ventilation: Mask ventilation without difficulty Laryngoscope Size: Mac and 3 Grade View: Grade I Tube type: Oral Endobronchial tube: Left, Double lumen EBT, EBT position confirmed by auscultation and EBT position confirmed by fiberoptic bronchoscope and 37 Fr Number of attempts: 1 Airway Equipment and Method: stylet and fiberoptic brochoscope Placement Confirmation: ETT inserted through vocal cords under direct vision,  positive ETCO2,  CO2 detector and breath sounds checked- equal and bilateral Secured at: 28 cm Tube secured with: Tape Dental Injury: Teeth and Oropharynx as per pre-operative assessment

## 2011-11-18 NOTE — Transfer of Care (Signed)
Immediate Anesthesia Transfer of Care Note  Patient: Stephanie [name]  Procedure(s) Performed:  VIDEO ASSISTED THORACOSCOPY; LOBECTOMY - LEFT UPPER LOBECTOMY  Patient Location: PACU  Anesthesia Type: General  Level of Consciousness: awake and alert   Airway & Oxygen Therapy: Patient Spontanous Breathing and Patient connected to face mask oxygen  Post-op Assessment: Report given to PACU RN, Post -op Vital signs reviewed and stable and Patient moving all extremities  Post vital signs: Reviewed and stable Filed Vitals:   11/18/11 0558  BP: 162/87  Pulse: 58  Temp: 36.4 C  Resp: 18    Complications: No apparent anesthesia complications

## 2011-11-18 NOTE — Interval H&P Note (Signed)
History and Physical Interval Note:  11/18/2011 7:47 AM  Stephanie [name]  has presented today for surgery, with the diagnosis of LUL MASS  The various methods of treatment have been discussed with the patient and family. After consideration of risks, benefits and other options for treatment, the patient has consented to  Procedure(s): VIDEO ASSISTED THORACOSCOPY LOBECTOMY as a surgical intervention .  The patients' history has been reviewed, patient examined, no change in status, stable for surgery.  I have reviewed the patients' chart and labs.  Questions were answered to the patient's satisfaction.     Ruby Dilone C

## 2011-11-18 NOTE — OR Nursing (Signed)
Left Upper Lobe for Bronchial Margins Frozen section specimen sent at 1021 received at 1025.

## 2011-11-18 NOTE — H&P (View-Only) (Signed)
PCP is Cassell Smiles., MD, MD Referring Provider is Leslye Peer., MD  Chief Complaint  Patient presents with  . Lung Cancer    MTOC/ Lung cancer, possible surgical candidate, BX 10/13/11, PET 10/20/11    HPI: 71 year old woman hospitalized in November with a stroke which caused some speech difficulties and right hand weakness. During admission a chest x-ray question of a pneumonia versus a mass in the left upper lobe. She had a CT scan which confirmed a mass and also showed a questionably enlarged AP window nodes. She subsequently had a PET/CT which showed hypermetabolic uptake in the left upper lobe mass/infiltrate, the AP window node was not hypermetabolic in relation to the surrounding mediastinal uptake. Successfully had a transthoracic needle biopsy which showed a well-differentiated adenocarcinoma. She is a lifelong nonsmoker, she does have some exposure to secondhand smoke from working in a [employer], but no one in her home was a smoker.   Past Medical History  Diagnosis Date  . Hypertension   . Arthritis   . CVA (cerebral infarction)   . Stroke   . Hypercholesteremia     Past Surgical History  Procedure Date  . No past surgeries   . Lung biopsy 10/13/11    left    Family History  Problem Relation Age of Onset  . Stroke Mother   . Stroke Sister     Social History History  Substance Use Topics  . Smoking status: Never Smoker   . Smokeless tobacco: Never Used  . Alcohol Use: No    Current Outpatient Prescriptions  Medication Sig Dispense Refill  . aspirin 325 MG tablet Take 1 tablet (325 mg total) by mouth daily.  30 tablet  0  . atenolol (TENORMIN) 50 MG tablet Take 50 mg by mouth daily at 8 pm.        . lisinopril (PRINIVIL,ZESTRIL) 5 MG tablet Take 1 tablet (5 mg total) by mouth daily.  30 tablet  0  . simvastatin (ZOCOR) 20 MG tablet Take 1 tablet (20 mg total) by mouth daily at 6 PM.  30 tablet  0    No Known Allergies  Review of Systems    Constitutional: Negative for fever, activity change, appetite change and unexpected weight change.       Back at work  Respiratory: Negative for cough, chest tightness, shortness of breath, wheezing and stridor.   Cardiovascular: Negative for chest pain and leg swelling.  Genitourinary: Negative.   Neurological: Positive for speech difficulty and weakness (right grip weak after stroke in November).  Hematological: Negative for adenopathy. Does not bruise/bleed easily.  All other systems reviewed and are negative.    BP 180/87  Pulse 60  Resp 20  Ht 5\' 7"  (1.702 m)  Wt 165 lb (74.844 kg)  BMI 25.84 kg/m2  SpO2 98% Physical Exam  Vitals reviewed. Constitutional: She is oriented to person, place, and time. She appears well-developed and well-nourished.       obese  HENT:  Head: Normocephalic and atraumatic.  Eyes: EOM are normal. Pupils are equal, round, and reactive to light.  Neck: No JVD present. No tracheal deviation present. No thyromegaly present.  Cardiovascular: Normal rate, regular rhythm and normal heart sounds.  Exam reveals no gallop and no friction rub.   No murmur heard. Pulmonary/Chest: Effort normal and breath sounds normal. She has no wheezes. She has no rales.  Abdominal: Soft. There is no tenderness.  Lymphadenopathy:    She has no cervical adenopathy.  Neurological: She is alert and oriented to person, place, and time.  Skin: Skin is warm and dry.     Diagnostic Tests: CT of chest and PET/CT reviewed findings as previously noted  Pulmonary function testing revealed an FVC of 1.48 (43% predicted), FEV1 was 1.43L (55% predicted), diffusion capacity was only 36% of predicted., But the DL/VA was 04% predicted. It should be noted that the pulmonary function testing was done while she was in the hospital, with a pneumothorax after her CT-guided biopsy.  Impression: 71 year old woman with a 3 x 4 cm infiltrative left upper lobe mass, which by biopsy is a  well-differentiated adenocarcinoma. 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 Given the appearance of the mass on CT and this infiltrative nature and its size and location, it is unlikely that a wedge resection would provide an adequate margin. In all likelihood she would require a lobectomy or least a segmentectomy for surgery to be beneficial.  I long discussion with Mrs. Mcnulty and her daughter, and her regarding the findings and treatment options. They understand that surgery is the best treatment for localized lung cancer, and understand the reasoning behind anatomic resection. I recommended that she have a left VATS, left upper lobectomy or segmentectomy. I have discussed with the patient and her daughter the general nature of the procedure, need for general anesthesia,and incisions to be used. I have discussed the expected hospital stay, overall recovery and short and long term outcomes. They understand the risks include but are not limited to death, stroke, MI, DVT/PE, bleeding, possible need for transfusion, infections,other organ system dysfunction including respiratory, renal, or GI complications.   As well as discussing the risks of the procedure with Mrs. Gartner he became very upset and tearful, and was really unable to continue the conversation. She wishes to have additional time to think over her options, prior to making a decision as to whether to proceed with surgery.  Plan: I would like to repeat her pulmonary function testing and other her pneumothorax is resolved, to see if that has any in fact on the previous results. And also check a room air blood gas at that time.  We then to make a final determination is her ability to tolerate a lobectomy, should she decide to proceed with surgery.

## 2011-11-18 NOTE — Brief Op Note (Addendum)
11/18/2011  11:11 AM  PATIENT:  Stephanie [name]  71 y.o. female  PRE-OPERATIVE DIAGNOSIS:  LUL MASS  POST-OPERATIVE DIAGNOSIS:  Adenocarcinoma([place])  PROCEDURE:  Procedure(s): VIDEO ASSISTED THORACOSCOPY, LUL lobectomy, LND, On-Q catheter placement   SURGEON:  Surgeon(s): [name], MD  PHYSICIAN ASSISTANT: Gershon Crane PA-C   ANESTHESIA:   general  EBL:  Total I/O In: 1050 [I.V.:1050] Out: 350 [Urine:150; Blood:200]  BLOOD ADMINISTERED:none  DRAINS: 2 Chest Tube(s) in the L hemithorax   LOCAL MEDICATIONS USED:  NONE, ON-Q placed  SPECIMEN:  Source of Specimen:  LUL,multiple LN's  DISPOSITION OF SPECIMEN:  PATHOLOGY  COUNTS:  YES  PLAN OF CARE: Admit to inpatient   PATIENT DISPOSITION:  PACU - hemodynamically stable.   Delay start of Pharmacological VTE agent (>24hrs) due to surgical blood loss or risk of bleeding:  YES

## 2011-11-18 NOTE — Anesthesia Postprocedure Evaluation (Signed)
Anesthesia Post Note  Patient: Stephanie [name]  Procedure(s) Performed:  VIDEO ASSISTED THORACOSCOPY; LOBECTOMY - LEFT UPPER LOBECTOMY  Anesthesia type: General  Patient location: PACU  Post pain: Pain level controlled and Adequate analgesia  Post assessment: Post-op Vital signs reviewed, Patient's Cardiovascular Status Stable, Respiratory Function Stable, Patent Airway and Pain level controlled  Last Vitals:  Filed Vitals:   11/18/11 1134  BP:   Pulse:   Temp: 36.6 C  Resp:     Post vital signs: Reviewed and stable  Level of consciousness: awake, alert  and oriented  Complications: No apparent anesthesia complications

## 2011-11-19 ENCOUNTER — Inpatient Hospital Stay (HOSPITAL_COMMUNITY): Payer: Medicare HMO

## 2011-11-19 LAB — BASIC METABOLIC PANEL
BUN: 11 mg/dL (ref 6–23)
Calcium: 8.1 mg/dL — ABNORMAL LOW (ref 8.4–10.5)
Chloride: 109 mEq/L (ref 96–112)
Creatinine, Ser: 0.81 mg/dL (ref 0.50–1.10)
GFR calc Af Amer: 84 mL/min — ABNORMAL LOW (ref >90)
GFR calc non Af Amer: 72 mL/min — ABNORMAL LOW (ref >90)

## 2011-11-19 LAB — BLOOD GAS, ARTERIAL
Acid-base deficit: 0.2 mmol/L (ref 0.0–2.0)
Drawn by: 24513
FIO2: 28 %
O2 Saturation: 99.2 %
Patient temperature: 97.9

## 2011-11-19 LAB — GLUCOSE, CAPILLARY
Glucose-Capillary: 146 mg/dL — ABNORMAL HIGH (ref 70–99)
Glucose-Capillary: 150 mg/dL — ABNORMAL HIGH (ref 70–99)
Glucose-Capillary: 178 mg/dL — ABNORMAL HIGH (ref 70–99)

## 2011-11-19 LAB — CBC
HCT: 35.3 % — ABNORMAL LOW (ref 36.0–46.0)
Hemoglobin: 12.2 g/dL (ref 12.0–15.0)
MCH: 32.5 pg (ref 26.0–34.0)
MCHC: 34.6 g/dL (ref 30.0–36.0)
MCV: 94.1 fL (ref 78.0–100.0)
RDW: 12.3 % (ref 11.5–15.5)

## 2011-11-19 MED ORDER — SODIUM CHLORIDE 0.9 % IJ SOLN
10.0000 mL | Freq: Two times a day (BID) | INTRAMUSCULAR | Status: DC
  Administered 2011-11-19 – 2011-11-22 (×6): 10 mL via INTRAVENOUS
  Filled 2011-11-19 (×6): qty 10

## 2011-11-19 MED ORDER — SALINE SPRAY 0.65 % NA SOLN
1.0000 | NASAL | Status: DC | PRN
  Filled 2011-11-19: qty 44

## 2011-11-19 NOTE — Op Note (Signed)
NAMEZARYAH, [name] NO.:  [id number]  MEDICAL RECORD NO.:  [id number]  LOCATION:  3315                         FACILITY:  [facility]  PHYSICIAN:  Salvatore Decent. Dorris Fetch, M.D.DATE OF BIRTH:  1941/01/19  DATE OF PROCEDURE:  11/18/2011 DATE OF DISCHARGE:                              OPERATIVE REPORT   PREOPERATIVE DIAGNOSIS:  Adenocarcinoma, left upper lobe.  POSTOPERATIVE DIAGNOSIS:  Adenocarcinoma, left upper lobe.  PROCEDURE:  Left video-assisted thoracoscopic left upper lobectomy with mediastinal lymph node dissection, placement of On-Q local anesthetic catheter.  SURGEON:  Salvatore Decent. Dorris Fetch, MD  ASSISTANT:  Rowe Clack, PA-C  ANESTHESIA:  General.  FINDINGS:  A 3-cm mass left upper lobe.  Bronchial margins negative for tumor.  Multiple enlarged but otherwise benign-appearing lymph nodes throughout, sent for permanent sections.  CLINICAL NOTE:  Stephanie [name] is a 71 year old woman who was hospitalized in November with a stroke and was found to have a mass in the left upper lobe.  This was hypermetabolic on PET, and needle biopsy showed well- differentiated adenocarcinoma.  She was advised to undergo surgical resection.  The indications, risks, benefits, and alternatives were discussed in detail with the patient.  After debate and discussion with her family, she decided to proceed with surgery, understanding and accepting the risk.  OPERATIVE NOTE:  Stephanie [name] was brought to the preop holding area on November 18, 2011.  There, the Anesthesia service established central venous access and placed an arterial blood pressure monitoring line. Intravenous antibiotics were administered.  She was taken the operating room, anesthetized, and intubated with a double-lumen endotracheal tube. PAS hose were placed for DVT prophylaxis.  A Foley catheter was placed. The patient was placed in a right lateral decubitus position, and the left chest was prepped and draped in  usual sterile fashion.  After performing a time-out, single lung ventilation of the right lung was carried out and was tolerated well throughout the procedure.  An incision was made in the midaxillary line in the seventh intercostal space and was carried through the skin and subcutaneous tissue.  The chest was entered bluntly using a hemostat.  A port was inserted and the thoracoscope was placed through the port.  A small utility thoracotomy incision was made anterolaterally in approximately the fourth intercostal space.  A small port incision was made posteriorly below the tip of the scapula in approximately at the fifth intercostal space. There was good isolation of the left lung.  The pleural reflection was divided medially exposing the pulmonary veins.  The initial dissection was begun in the fissure which was relatively complete in its mid portion, but there was communication in the upper and lower lobes at the most anterior and posterior aspects of the fissure.  The medial aspect was completed with a single firing of an Endo-GIA stapler.  The pulmonary venous branches were dissected out.  The lingular branches were divided with the Endo-GIA stapler using a tan cartridge.  Next, the remaining branches were divided again using an Endo-GIA stapler.  There were enlarged but otherwise benign-appearing lymph nodes which were relatively vascular with pulmonary venous branches and these were taken and sent as  separate specimens.  Next, the lung was retracted anteriorly and the posterior aspect of the pleural reflection was divided as was the inferior pulmonary ligament.  Next, nodes were dissected off the left mainstem bronchus and sent as separate specimens.  Mainstem bronchus was isolated and divided with an Endo-GIA stapler using a purple cartridge.  The lingular arterial branches had been included when the major fissure was completed.  The only remaining branches were to  the anteroapical and posterior segments.  These were divided with an Endo- GIA stapler, and finally the remaining attachments to the superior segment of the lower lobe were divided with and Endo-GIA stapler as well.  The specimen was placed into an endoscopic retrieval bag and brought through the anterior incision.  There was good hemostasis at all staple lines.  The specimen was sent for frozen section on the margins which was negative for tumor.  Next, the mediastinal nodes were dissected out.  There were multiple level 4, 5, and 6 nodes which were sent as separate specimens.  The level 5 and 6 nodes were relatively enlarged again but were not definitely involved with the cancer.  The subcarinal nodes were difficult to access and there was no definite nodal tissue accessible in that area.  Final inspection was made for hemostasis.  The chest was filled with warm saline.  A test inflation showed no leakage of the bronchial stump. There was minimal leakage along the staple lines on the lower lobe.  The incision was evacuated.  ProGEL was applied to the staple lines.  An On- Q local anesthetic catheter was placed through a separate stab wound posteriorly and placed in a subpleural location and secured with a 3-0 silk suture.  The chest was once again irrigated.  The final inspection for hemostasis was made.  A 28-French Blake drain was placed posteriorly, and a 28-French chest tube was placed anteriorly and posteriorly.  These were secured with #1 silk sutures.  The posterior port incision was closed with 0 Vicryl fascial suture and a 3-0 Vicryl subcuticular suture.  The anterior thoracotomy incision was closed with a #1 running Vicryl suture for the muscular layers followed by a 2-0 Vicryl subcutaneous suture and 3-0 Vicryl subcuticular suture.  The patient was placed back in a supine position.  She was extubated in the operating room and taken to the postanesthetic care unit in  good condition.     Salvatore Decent Dorris Fetch, M.D.     SCH/MEDQ  D:  11/18/2011  T:  11/19/2011  Job:  [id number]

## 2011-11-19 NOTE — Progress Notes (Addendum)
[name]            [place] [address]          [phone]     1 Day Post-Op  Procedure(s) (LRB): VIDEO ASSISTED THORACOSCOPY (Left) LOBECTOMY (Left) Subjective: Feels "sore"  Objective  Telemetry SR/Stach  Temp:  [97.6 F (36.4 C)-98.7 F (37.1 C)] 97.7 F (36.5 C) (01/19 1635) Pulse Rate:  [60-88] 80  (01/19 1635) Resp:  [16-22] 22  (01/19 1645) BP: (147-180)/(57-80) 168/76 mmHg (01/19 1635) SpO2:  [96 %-100 %] 99 % (01/19 1645) Arterial Line BP: (180-214)/(80-111) 194/85 mmHg (01/19 1635) Weight:  [184 lb 8.4 oz (83.7 kg)] 184 lb 8.4 oz (83.7 kg) (01/18 1753)   Intake/Output Summary (Last 24 hours) at 11/19/11 1733 Last data filed at 11/19/11 1600  Gross per 24 hour  Intake   3195 ml  Output   1965 ml  Net   1230 ml       General appearance: alert and no distress Heart: regular rate and rhythm and S1, S2 normal Lungs: diminished in the L base Abdomen: soft, non-tender; bowel sounds normal; no masses,  no organomegaly Extremities: PAS in place Wound: incisions healing well  Lab Results:  Basename 11/19/11 0505  NA 139  K 3.4*  CL 109  CO2 22  GLUCOSE 158*  BUN 11  CREATININE 0.81  CALCIUM 8.1*  MG --  PHOS --   No results found for this basename: AST:2,ALT:2,ALKPHOS:2,BILITOT:2,PROT:2,ALBUMIN:2 in the last 72 hours No results found for this basename: LIPASE:2,AMYLASE:2 in the last 72 hours  Basename 11/19/11 0505  WBC 8.8  NEUTROABS --  HGB 12.2  HCT 35.3*  MCV 94.1  PLT 128*   No results found for this basename: CKTOTAL:4,CKMB:4,TROPONINI:4 in the last 72 hours No components found with this basename: POCBNP:3 No results found for this basename: DDIMER in the last 72 hours No results found for this basename: HGBA1C in the last 72 hours No results found for this basename: CHOL,HDL,LDLCALC,TRIG,CHOLHDL in the last 72 hours No results found for this basename: TSH,T4TOTAL,FREET3,T3FREE,THYROIDAB in the  last 72 hours No results found for this basename: VITAMINB12,FOLATE,FERRITIN,TIBC,IRON,RETICCTPCT in the last 72 hours  Medications: Scheduled    . acetaminophen  1,000 mg Intravenous Q6H  . albuterol  2 puff Inhalation Q6H  . aspirin  325 mg Oral Daily  . atenolol  50 mg Oral Q2000  . bisacodyl  10 mg Oral Daily  . cefUROXime (ZINACEF)  IV  1.5 g Intravenous Q12H  . fentaNYL   Intravenous Q4H  . HYDROmorphone      . insulin aspart  0-24 Units Subcutaneous Q6H  . lisinopril  5 mg Oral Daily  . simvastatin  20 mg Oral q1800  . sodium chloride  10 mL Intravenous Q12H     Radiology/Studies:  Dg Chest Port 1 View  11/19/2011  *RADIOLOGY REPORT*  Clinical Data: Left-sided chest tube.  PORTABLE CHEST - 1 VIEW  Comparison: 11/18/2011  Findings: Stable position of the left chest tubes.  Questionable left apical pneumothorax.  Findings are similar to the prior examination.  Left subclavian central line is near the upper SVC region.  Again noted are prominent interstitial densities bilaterally.  Heart and mediastinum are grossly stable.  Trachea is midline.  IMPRESSION: Stable position of the left chest tubes and question a small left pneumothorax.  These findings are similar to the  prior examination.  Stable interstitial densities.  Original Report Authenticated By: Stephanie [name], M.D.   Dg Chest Portable 1 View  11/18/2011  *RADIOLOGY REPORT*  Clinical Data: Postop.  PORTABLE CHEST - 1 VIEW  Comparison: 11/16/2011.  Findings: Left lung surgery with decrease in volume of the left lung.  Left-sided chest tubes are in place.  Small left apical pneumothorax (approximately 5%).  Left central line is in place with the tip at the level of the junction of the left bracheocephalic vein and formation of the superior vena cava level.  This is directed laterally.  No tenting of the lateral aspect of the superior vena cava.  Cardiomegaly.  Pulmonary vascular prominence most notable centrally.  IMPRESSION: Left lung  surgery with decrease in volume of the left lung.  Left- sided chest tubes are in place.  Small left apical pneumothorax (approximately 5%).  Left central line is in place with the tip at the level of the junction of the left bracheocephalic vein and formation of the superior vena cava level.  Cardiomegaly.  Pulmonary vascular prominence most notable centrally.  Original Report Authenticated By: Stephanie [name], M.D.    INR: Will add last result for INR, ABG once components are confirmed Will add last 4 CBG results once components are confirmed  Assessment/Plan: S/P Procedure(s) (LRB): VIDEO ASSISTED THORACOSCOPY (Left) LOBECTOMY (Left)  1. Doing well 2. D/c a line 3. Decrease IVF 4.CT-mod drainage, no air leak- leave today  LOS: 1 day    Stephanie [name],Stephanie [name] 1/19/20135:33 PM    I have seen and examined Stephanie [name] and agree with the above assessment  and plan.  Stephanie Ovens MD Beeper [phone] Office [phone] 11/19/2011 6:35 PM

## 2011-11-20 ENCOUNTER — Inpatient Hospital Stay (HOSPITAL_COMMUNITY): Payer: Medicare HMO

## 2011-11-20 LAB — CBC
HCT: 36.3 % (ref 36.0–46.0)
Hemoglobin: 12.3 g/dL (ref 12.0–15.0)
MCHC: 33.9 g/dL (ref 30.0–36.0)
MCV: 95.5 fL (ref 78.0–100.0)

## 2011-11-20 LAB — COMPREHENSIVE METABOLIC PANEL
Alkaline Phosphatase: 54 U/L (ref 39–117)
BUN: 8 mg/dL (ref 6–23)
Chloride: 104 mEq/L (ref 96–112)
Creatinine, Ser: 0.79 mg/dL (ref 0.50–1.10)
GFR calc Af Amer: >90 mL/min (ref >90)
Glucose, Bld: 140 mg/dL — ABNORMAL HIGH (ref 70–99)
Potassium: 3.5 mEq/L (ref 3.5–5.1)
Total Bilirubin: 0.7 mg/dL (ref 0.3–1.2)
Total Protein: 5.7 g/dL — ABNORMAL LOW (ref 6.0–8.3)

## 2011-11-20 LAB — GLUCOSE, CAPILLARY: Glucose-Capillary: 140 mg/dL — ABNORMAL HIGH (ref 70–99)

## 2011-11-20 MED ORDER — AMIODARONE HCL IN DEXTROSE 360-4.14 MG/200ML-% IV SOLN
0.5000 mg/min | INTRAVENOUS | Status: DC
  Filled 2011-11-20: qty 200

## 2011-11-20 MED ORDER — AMIODARONE LOAD VIA INFUSION
150.0000 mg | Freq: Once | INTRAVENOUS | Status: AC
  Administered 2011-11-20: 150 mg via INTRAVENOUS
  Filled 2011-11-20: qty 83.34

## 2011-11-20 MED ORDER — AMIODARONE HCL IN DEXTROSE 360-4.14 MG/200ML-% IV SOLN
1.0000 mg/min | INTRAVENOUS | Status: DC
  Administered 2011-11-20 (×2): 1 mg/min via INTRAVENOUS
  Filled 2011-11-20 (×3): qty 200

## 2011-11-20 MED ORDER — AMIODARONE HCL 200 MG PO TABS: 400.0000 mg | ORAL_TABLET | Freq: Every day | ORAL | Status: DC

## 2011-11-20 MED ORDER — AMIODARONE HCL 200 MG PO TABS
400.0000 mg | ORAL_TABLET | Freq: Two times a day (BID) | ORAL | Status: DC
  Filled 2011-11-20: qty 2

## 2011-11-20 MED ORDER — AMIODARONE HCL 200 MG PO TABS
400.0000 mg | ORAL_TABLET | Freq: Two times a day (BID) | ORAL | Status: DC
  Administered 2011-11-20 – 2011-11-23 (×6): 400 mg via ORAL
  Filled 2011-11-20 (×8): qty 2

## 2011-11-20 NOTE — Progress Notes (Signed)
Fentanyl PCA -- 8 ml -- wasted in sink; witnessed by [employer]. Renette Butters, Viona Gilmore

## 2011-11-20 NOTE — Progress Notes (Signed)
S/p VATS with post op A-Fib.  Started on Amiodarone.  Pharmacy consulted for medication review and drug interactions.  There are no current drug interaction.   Of note patient is receiving max dose of simvastatin 20mg  daily when given with amiodarone.  If up titration of statin is needed then pt will need to switch to atorvastatin or rouvastatin which do not have max dose limits with amiodarone.  Stephanie [name].D.

## 2011-11-20 NOTE — Progress Notes (Addendum)
[address]            [place] [address]          [phone]     2 Days Post-Op  Procedure(s) (LRB): VIDEO ASSISTED THORACOSCOPY (Left) LOBECTOMY (Left) Subjective: Weak and sore this am  Objective  Telemetry- afib-NSR  Temp:  [97.7 F (36.5 C)-98.7 F (37.1 C)] 98.3 F (36.8 C) (01/20 0741) Pulse Rate:  [69-123] 123  (01/20 0845) Resp:  [16-25] 21  (01/20 0845) BP: (113-182)/(54-84) 118/74 mmHg (01/20 0845) SpO2:  [91 %-99 %] 92 % (01/20 0845) Arterial Line BP: (194-214)/(85-109) 194/85 mmHg (01/19 1635)   Intake/Output Summary (Last 24 hours) at 11/20/11 1037 Last data filed at 11/20/11 0846  Gross per 24 hour  Intake   2720 ml  Output   1700 ml  Net   1020 ml       General appearance: alert, fatigued and no distress Heart: regular rate and rhythm, S1, S2 normal, no murmur, click, rub or gallop Lungs: diminished in bases, sl course in upper airways, improves with cough Abdomen: soft, non-tender; bowel sounds normal; no masses,  no organomegaly Extremities: no edema, redness or tenderness in the calves or thighs Wound: incisions healing well  Lab Results:  Basename 11/20/11 0630 11/19/11 0505  NA 135 139  K 3.5 3.4*  CL 104 109  CO2 26 22  GLUCOSE 140* 158*  BUN 8 11  CREATININE 0.79 0.81  CALCIUM 8.8 8.1*  MG -- --  PHOS -- --    Basename 11/20/11 0630  AST 16  ALT 10  ALKPHOS 54  BILITOT 0.7  PROT 5.7*  ALBUMIN 2.6*   No results found for this basename: LIPASE:2,AMYLASE:2 in the last 72 hours  Basename 11/20/11 0630 11/19/11 0505  WBC 9.0 8.8  NEUTROABS -- --  HGB 12.3 12.2  HCT 36.3 35.3*  MCV 95.5 94.1  PLT 144* 128*   No results found for this basename: CKTOTAL:4,CKMB:4,TROPONINI:4 in the last 72 hours No components found with this basename: POCBNP:3 No results found for this basename: DDIMER in the last 72 hours No results found for this basename: HGBA1C in the last 72 hours No results found  for this basename: CHOL,HDL,LDLCALC,TRIG,CHOLHDL in the last 72 hours No results found for this basename: TSH,T4TOTAL,FREET3,T3FREE,THYROIDAB in the last 72 hours No results found for this basename: VITAMINB12,FOLATE,FERRITIN,TIBC,IRON,RETICCTPCT in the last 72 hours  Medications: Scheduled    . acetaminophen  1,000 mg Intravenous Q6H  . albuterol  2 puff Inhalation Q6H  . amiodarone  150 mg Intravenous Once  . amiodarone  400 mg Oral Q12H   Followed by  . amiodarone  400 mg Oral Daily  . aspirin  325 mg Oral Daily  . atenolol  50 mg Oral Q2000  . bisacodyl  10 mg Oral Daily  . fentaNYL   Intravenous Q4H  . lisinopril  5 mg Oral Daily  . simvastatin  20 mg Oral q1800  . sodium chloride  10 mL Intravenous Q12H  . DISCONTD: insulin aspart  0-24 Units Subcutaneous Q6H     Radiology/Studies:  Dg Chest 2 View  11/20/2011  *RADIOLOGY REPORT*  Clinical Data: Left upper lobectomy.  CHEST - 2 VIEW  Comparison: 11/19/2011  Findings: Support devices are in stable position.  Left chest tubes remain in place.  Stable left pneumothorax.  Patchy left lung and right basilar airspace  disease, unchanged.  IMPRESSION: No significant change.  Original Report Authenticated By: Cyndie Chime, M.D.   Dg Chest Port 1 View  11/19/2011  *RADIOLOGY REPORT*  Clinical Data: Left-sided chest tube.  PORTABLE CHEST - 1 VIEW  Comparison: 11/18/2011  Findings: Stable position of the left chest tubes.  Questionable left apical pneumothorax.  Findings are similar to the prior examination.  Left subclavian central line is near the upper SVC region.  Again noted are prominent interstitial densities bilaterally.  Heart and mediastinum are grossly stable.  Trachea is midline.  IMPRESSION: Stable position of the left chest tubes and question a small left pneumothorax.  These findings are similar to the prior examination.  Stable interstitial densities.  Original Report Authenticated By: Richarda Overlie, M.D.   Dg Chest Portable 1  View  11/18/2011  *RADIOLOGY REPORT*  Clinical Data: Postop.  PORTABLE CHEST - 1 VIEW  Comparison: 11/16/2011.  Findings: Left lung surgery with decrease in volume of the left lung.  Left-sided chest tubes are in place.  Small left apical pneumothorax (approximately 5%).  Left central line is in place with the tip at the level of the junction of the left bracheocephalic vein and formation of the superior vena cava level.  This is directed laterally.  No tenting of the lateral aspect of the superior vena cava.  Cardiomegaly.  Pulmonary vascular prominence most notable centrally.  IMPRESSION: Left lung surgery with decrease in volume of the left lung.  Left- sided chest tubes are in place.  Small left apical pneumothorax (approximately 5%).  Left central line is in place with the tip at the level of the junction of the left bracheocephalic vein and formation of the superior vena cava level.  Cardiomegaly.  Pulmonary vascular prominence most notable centrally.  Original Report Authenticated By: Fuller Canada, M.D.    INR: Will add last result for INR, ABG once components are confirmed Will add last 4 CBG results once components are confirmed  Assessment/Plan: S/P Procedure(s) (LRB): VIDEO ASSISTED THORACOSCOPY (Left) LOBECTOMY (Left)  1. Stable but appears weaker and more fatigued today. Push rehab as able 2.  not eating much, push nutrition as able 3. CT 520 cc out yesterday, small pntx, could poss d/c one tube soon. 4. pulm toilet, add flutter valve 5. Afib, started on amio  LOS: 2 days    GOLD,WAYNE E 1/20/201310:37 AM    Now back in sinus , continue po Cordarone D/C one Ct today Ct to water seal I have seen and examined Stephanie [name] and agree with the above assessment  and plan.  Delight Ovens MD Beeper [phone] Office [phone] 11/20/2011 1:03 PM

## 2011-11-20 NOTE — Progress Notes (Signed)
Dr. Tyrone Sage notified that pt is in a-fib -- rate 110s-140s. Order received to start Amiodarone drip. Renette Butters, Viona Gilmore

## 2011-11-21 ENCOUNTER — Inpatient Hospital Stay (HOSPITAL_COMMUNITY): Payer: Medicare HMO

## 2011-11-21 LAB — BASIC METABOLIC PANEL
BUN: 8 mg/dL (ref 6–23)
CO2: 26 mEq/L (ref 19–32)
Calcium: 8.6 mg/dL (ref 8.4–10.5)
Chloride: 106 mEq/L (ref 96–112)
Creatinine, Ser: 0.77 mg/dL (ref 0.50–1.10)
GFR calc Af Amer: >90 mL/min (ref >90)
GFR calc non Af Amer: 84 mL/min — ABNORMAL LOW (ref >90)
Glucose, Bld: 120 mg/dL — ABNORMAL HIGH (ref 70–99)
Potassium: 3.6 mEq/L (ref 3.5–5.1)
Sodium: 137 mEq/L (ref 135–145)

## 2011-11-21 LAB — CBC
HCT: 33.6 % — ABNORMAL LOW (ref 36.0–46.0)
Hemoglobin: 11.8 g/dL — ABNORMAL LOW (ref 12.0–15.0)
MCH: 33.6 pg (ref 26.0–34.0)
MCHC: 35.1 g/dL (ref 30.0–36.0)
MCV: 95.7 fL (ref 78.0–100.0)
Platelets: 117 10*3/uL — ABNORMAL LOW (ref 150–400)
RBC: 3.51 MIL/uL — ABNORMAL LOW (ref 3.87–5.11)
RDW: 12.5 % (ref 11.5–15.5)
WBC: 6.3 10*3/uL (ref 4.0–10.5)

## 2011-11-21 MED ORDER — DM-GUAIFENESIN ER 30-600 MG PO TB12
1.0000 | ORAL_TABLET | Freq: Two times a day (BID) | ORAL | Status: DC | PRN
  Filled 2011-11-21: qty 1

## 2011-11-21 MED ORDER — MOXIFLOXACIN HCL IN NACL 400 MG/250ML IV SOLN
400.0000 mg | INTRAVENOUS | Status: DC
  Administered 2011-11-21 – 2011-11-22 (×2): 400 mg via INTRAVENOUS
  Filled 2011-11-21 (×2): qty 250

## 2011-11-21 MED ORDER — LORAZEPAM 1 MG PO TABS: 1.0000 mg | ORAL_TABLET | Freq: Once | ORAL | Status: DC

## 2011-11-21 NOTE — Progress Notes (Addendum)
[address]            [place] [address]          [phone]     3 Days Post-Op  Procedure(s) (LRB): VIDEO ASSISTED THORACOSCOPY (Left) LOBECTOMY (Left) Subjective: Feels better today   Objective  Telemetry SR/Sbrady  Temp:  [97.9 F (36.6 C)-98.8 F (37.1 C)] 98.5 F (36.9 C) (01/21 0800) Pulse Rate:  [55-81] 57  (01/21 0800) Resp:  [15-20] 15  (01/21 0800) BP: (121-165)/(52-87) 150/76 mmHg (01/21 0800) SpO2:  [95 %-98 %] 98 % (01/21 0800)   Intake/Output Summary (Last 24 hours) at 11/21/11 1033 Last data filed at 11/21/11 0800  Gross per 24 hour  Intake 2084.9 ml  Output    460 ml  Net 1624.9 ml       General appearance: alert, cooperative, fatigued and no distress Heart: regular rate and rhythm and S1, S2 normal Lungs: coarse BS's throughout Abdomen: soft, nontender Extremities: no edema Wound: incisions healing well  Lab Results:  Basename 11/21/11 0500 11/20/11 0630  NA 137 135  K 3.6 3.5  CL 106 104  CO2 26 26  GLUCOSE 120* 140*  BUN 8 8  CREATININE 0.77 0.79  CALCIUM 8.6 8.8  MG -- --  PHOS -- --    Basename 11/20/11 0630  AST 16  ALT 10  ALKPHOS 54  BILITOT 0.7  PROT 5.7*  ALBUMIN 2.6*   No results found for this basename: LIPASE:2,AMYLASE:2 in the last 72 hours  Basename 11/21/11 0500 11/20/11 0630  WBC 6.3 9.0  NEUTROABS -- --  HGB 11.8* 12.3  HCT 33.6* 36.3  MCV 95.7 95.5  PLT 117* 144*   No results found for this basename: CKTOTAL:4,CKMB:4,TROPONINI:4 in the last 72 hours No components found with this basename: POCBNP:3 No results found for this basename: DDIMER in the last 72 hours No results found for this basename: HGBA1C in the last 72 hours No results found for this basename: CHOL,HDL,LDLCALC,TRIG,CHOLHDL in the last 72 hours No results found for this basename: TSH,T4TOTAL,FREET3,T3FREE,THYROIDAB in the last 72 hours No results found for this basename:  VITAMINB12,FOLATE,FERRITIN,TIBC,IRON,RETICCTPCT in the last 72 hours  Medications: Scheduled    . albuterol  2 puff Inhalation Q6H  . amiodarone  400 mg Oral BID  . aspirin  325 mg Oral Daily  . atenolol  50 mg Oral Q2000  . bisacodyl  10 mg Oral Daily  . lisinopril  5 mg Oral Daily  . simvastatin  20 mg Oral q1800  . sodium chloride  10 mL Intravenous Q12H  . DISCONTD: amiodarone  400 mg Oral Q12H  . DISCONTD: amiodarone  400 mg Oral Daily  . DISCONTD: fentaNYL   Intravenous Q4H     Radiology/Studies:  Dg Chest 2 View  11/20/2011  *RADIOLOGY REPORT*  Clinical Data: Left upper lobectomy.  CHEST - 2 VIEW  Comparison: 11/19/2011  Findings: Support devices are in stable position.  Left chest tubes remain in place.  Stable left pneumothorax.  Patchy left lung and right basilar airspace disease, unchanged.  IMPRESSION: No significant change.  Original Report Authenticated By: Cyndie Chime, M.D.   Dg Chest Port 1v Same Day  11/21/2011  *RADIOLOGY REPORT*  Clinical Data: follow-up surgery.  Shortness of breath  PORTABLE CHEST - 1 VIEW SAME DAY  Comparison: 11/20/2011  Findings: There is a left subclavian catheter in place.  The tip appears to be in the innominate vein.  Left-sided chest tube is identified.  There is a no significant change in the left-sided pneumothorax.  Asymmetric increased opacification within the left lobe has increased in the interval.  Stable appearance the right lung.  IMPRESSION:  1.  Left chest tube remains in place.  Stable left pneumothorax. 2.  Worsening opacification of the left lung.  Original Report Authenticated By: Rosealee Albee, M.D.   Chest tube no air leak INR: Will add last result for INR, ABG once components are confirmed Will add last 4 CBG results once components are confirmed  Assessment/Plan: S/P Procedure(s) (LRB): VIDEO ASSISTED THORACOSCOPY (Left) LOBECTOMY (Left)  1. Cont po amio for afib. Monitor HR, may need to adjust if becomes too  bradycardic 2. Add mucinex/ avelox for increased infiltrate. Push pulm toilet 3. Push rehab and nutrition 4.path still pending 5. Keep chest tube for now   LOS: 3 days    GOLD,WAYNE E 1/21/201310:33 AM

## 2011-11-21 NOTE — Progress Notes (Signed)
When walking back from [place], pt's HR up to 140s.  HR now ranging 80s-130s, back and forth between sinus rhythm and a fib.  BP 152/66.  Dr. Laneta Simmers notified.  Order received to given 8pm dose of atenolol now.  Will continue to monitor.    Roselie Awkward, RN

## 2011-11-22 ENCOUNTER — Inpatient Hospital Stay (HOSPITAL_COMMUNITY): Payer: Medicare HMO

## 2011-11-22 ENCOUNTER — Encounter (HOSPITAL_COMMUNITY): Payer: Self-pay | Admitting: Thoracic Surgery (Cardiothoracic Vascular Surgery)

## 2011-11-22 MED ORDER — BISACODYL 10 MG RE SUPP
10.0000 mg | Freq: Once | RECTAL | Status: AC
  Administered 2011-11-22: 10 mg via RECTAL
  Filled 2011-11-22: qty 1

## 2011-11-22 MED ORDER — AMIODARONE HCL 400 MG PO TABS: 400.0000 mg | ORAL_TABLET | Freq: Two times a day (BID) | ORAL | Status: DC

## 2011-11-22 MED ORDER — MOXIFLOXACIN HCL 400 MG PO TABS
400.0000 mg | ORAL_TABLET | Freq: Every day | ORAL | Status: DC
  Filled 2011-11-22: qty 1

## 2011-11-22 MED ORDER — DM-GUAIFENESIN ER 30-600 MG PO TB12: 1.0000 | ORAL_TABLET | Freq: Two times a day (BID) | ORAL | Status: AC | PRN

## 2011-11-22 MED ORDER — OXYCODONE-ACETAMINOPHEN 5-325 MG PO TABS: 1.0000 | ORAL_TABLET | ORAL | Status: AC | PRN

## 2011-11-22 NOTE — Progress Notes (Signed)
Patient ID: Stephanie [name], female   DOB: 09-Jan-1941, 71 y.o.   MRN: [id number] Chest tube out Post d/c CXR OK- small space, still has some LLL atelectasis/ infiltrate Off O2 BP 120/72  Pulse 63  Temp(Src) 98.1 F (36.7 C) (Oral)  Resp 16  Ht 5\' 7"  (1.702 m)  Wt 83.7 kg (184 lb 8.4 oz)  BMI 28.90 kg/m2  SpO2 98% Lungs- coarse rhonchi on left Overall doing well No BM yet- will give suppository Possibly home tomorrow or Thursday   PATH- T2aN0, Stage I B 0/16 nodes involved. Pt and daughter informed

## 2011-11-22 NOTE — Progress Notes (Addendum)
[address]            [place] [address]          [phone]     4 Days Post-Op  Procedure(s) (LRB): VIDEO ASSISTED THORACOSCOPY (Left) LOBECTOMY (Left) Subjective: Having PAF episodes  Objective  Telemetry NSR/PAF  Temp:  [97.5 F (36.4 C)-98.6 F (37 C)] 98 F (36.7 C) (01/22 0800) Pulse Rate:  [58-136] 58  (01/22 0800) Resp:  [14-25] 16  (01/22 0800) BP: (113-155)/(50-91) 155/66 mmHg (01/22 0800) SpO2:  [86 %-100 %] 100 % (01/22 0800)   Intake/Output Summary (Last 24 hours) at 11/22/11 0844 Last data filed at 11/22/11 0800  Gross per 24 hour  Intake 1709.67 ml  Output   1600 ml  Net 109.67 ml       General appearance: alert and no distress Heart: regular rate and rhythm and S1, S2 normal Lungs: mildly diminished L base , much improved air exchange Abdomen: soft, non tender Extremities: no edema Wound: incisions healing well  Lab Results:  Basename 11/21/11 0500 11/20/11 0630  NA 137 135  K 3.6 3.5  CL 106 104  CO2 26 26  GLUCOSE 120* 140*  BUN 8 8  CREATININE 0.77 0.79  CALCIUM 8.6 8.8  MG -- --  PHOS -- --    Basename 11/20/11 0630  AST 16  ALT 10  ALKPHOS 54  BILITOT 0.7  PROT 5.7*  ALBUMIN 2.6*   No results found for this basename: LIPASE:2,AMYLASE:2 in the last 72 hours  Basename 11/21/11 0500 11/20/11 0630  WBC 6.3 9.0  NEUTROABS -- --  HGB 11.8* 12.3  HCT 33.6* 36.3  MCV 95.7 95.5  PLT 117* 144*   No results found for this basename: CKTOTAL:4,CKMB:4,TROPONINI:4 in the last 72 hours No components found with this basename: POCBNP:3 No results found for this basename: DDIMER in the last 72 hours No results found for this basename: HGBA1C in the last 72 hours No results found for this basename: CHOL,HDL,LDLCALC,TRIG,CHOLHDL in the last 72 hours No results found for this basename: TSH,T4TOTAL,FREET3,T3FREE,THYROIDAB in the last 72 hours No results found for this basename:  VITAMINB12,FOLATE,FERRITIN,TIBC,IRON,RETICCTPCT in the last 72 hours  Medications: Scheduled    . amiodarone  400 mg Oral BID  . aspirin  325 mg Oral Daily  . atenolol  50 mg Oral Q2000  . bisacodyl  10 mg Oral Daily  . lisinopril  5 mg Oral Daily  . LORazepam  1 mg Oral Once  . moxifloxacin  400 mg Intravenous Q24H  . simvastatin  20 mg Oral q1800  . sodium chloride  10 mL Intravenous Q12H     Radiology/Studies:  Dg Chest Port 1 View  11/22/2011  *RADIOLOGY REPORT*  Clinical Data: Chest tube evaluation.  PORTABLE CHEST - 1 VIEW  Comparison: Multiple priors, most recent 11/21/2011.  Findings: Left-sided chest tube is unchanged in position.  Pleural line is visible laterally at the level of the posterior third and fourth rib interspace.  A very small pneumothorax visualized, without significant change.  Left subclavian central venous catheter terminates in the left innominate vein, unchanged.  The lung volumes are low bilaterally.  Patchy left basilar opacities are unchanged.  There is peribronchial thickening bilaterally.  No focal opacities on the right.  No visible pleural effusion.  IMPRESSION:  1.  No significant change in very small left pneumothorax, with left  chest tube in place. 2.  Persistent low lung volumes with left basilar opacities (atelectasis versus airspace disease) and bilateral pleural peribronchial thickening.  Original Report Authenticated By: Britta Mccreedy, M.D.   Dg Chest Port 1v Same Day  11/21/2011  *RADIOLOGY REPORT*  Clinical Data: follow-up surgery.  Shortness of breath  PORTABLE CHEST - 1 VIEW SAME DAY  Comparison: 11/20/2011  Findings: There is a left subclavian catheter in place.  The tip appears to be in the innominate vein.  Left-sided chest tube is identified.  There is a no significant change in the left-sided pneumothorax.  Asymmetric increased opacification within the left lobe has increased in the interval.  Stable appearance the right lung.  IMPRESSION:  1.   Left chest tube remains in place.  Stable left pneumothorax. 2.  Worsening opacification of the left lung.  Original Report Authenticated By: Rosealee Albee, M.D.   Chest tube- no air leak, 80 cc drainage yesterday INR: Will add last result for INR, ABG once components are confirmed Will add last 4 CBG results once components are confirmed  Assessment/Plan: S/P Procedure(s) (LRB): VIDEO ASSISTED THORACOSCOPY (Left) LOBECTOMY (Left)  1. Steady improvement 2. D/C CT 3 cont amio, BBlocker 4. Cont avelox, pulm toilet, rehab   LOS: 4 days    GOLD,WAYNE E 1/22/20138:44 AM    Ambulating much better Path Stage IB (T2b,N0,M0) Home in one or two days if rhythm stable  I have seen and examined Stephanie [name] and agree with the above assessment  and plan.  Delight Ovens MD Beeper [phone] Office [phone] 11/22/2011 2:29 PM

## 2011-11-23 ENCOUNTER — Inpatient Hospital Stay (HOSPITAL_COMMUNITY): Payer: Medicare HMO

## 2011-11-23 LAB — BASIC METABOLIC PANEL
BUN: 12 mg/dL (ref 6–23)
CO2: 27 mEq/L (ref 19–32)
Calcium: 8.8 mg/dL (ref 8.4–10.5)
Chloride: 106 mEq/L (ref 96–112)
Creatinine, Ser: 0.9 mg/dL (ref 0.50–1.10)
GFR calc Af Amer: 74 mL/min — ABNORMAL LOW (ref >90)
GFR calc non Af Amer: 64 mL/min — ABNORMAL LOW (ref >90)
Glucose, Bld: 110 mg/dL — ABNORMAL HIGH (ref 70–99)
Potassium: 3.4 mEq/L — ABNORMAL LOW (ref 3.5–5.1)
Sodium: 142 mEq/L (ref 135–145)

## 2011-11-23 LAB — CBC
HCT: 34.3 % — ABNORMAL LOW (ref 36.0–46.0)
Hemoglobin: 11.9 g/dL — ABNORMAL LOW (ref 12.0–15.0)
MCH: 32.9 pg (ref 26.0–34.0)
MCHC: 34.7 g/dL (ref 30.0–36.0)
MCV: 94.8 fL (ref 78.0–100.0)
Platelets: 143 10*3/uL — ABNORMAL LOW (ref 150–400)
RBC: 3.62 MIL/uL — ABNORMAL LOW (ref 3.87–5.11)
RDW: 12.3 % (ref 11.5–15.5)
WBC: 5.3 10*3/uL (ref 4.0–10.5)

## 2011-11-23 MED ORDER — POTASSIUM CHLORIDE 20 MEQ/15ML (10%) PO LIQD
40.0000 meq | Freq: Every day | ORAL | Status: AC
  Administered 2011-11-23: 40 meq via ORAL
  Filled 2011-11-23: qty 30

## 2011-11-23 MED ORDER — MOXIFLOXACIN HCL 400 MG PO TABS: 400.0000 mg | ORAL_TABLET | Freq: Every day | ORAL | Status: DC

## 2011-11-23 MED ORDER — ATENOLOL 50 MG PO TABS: 25.0000 mg | ORAL_TABLET | Freq: Every day | ORAL | Status: DC

## 2011-11-23 MED ORDER — MOXIFLOXACIN HCL 400 MG PO TABS: 400.0000 mg | ORAL_TABLET | Freq: Every day | ORAL | Status: AC

## 2011-11-23 MED ORDER — ATENOLOL 25 MG PO TABS
25.0000 mg | ORAL_TABLET | Freq: Every day | ORAL | Status: DC
  Filled 2011-11-23: qty 1

## 2011-11-23 NOTE — Progress Notes (Signed)
Pt d/c home via w/c with daugthers. D/c instructions, f/u appt.,prescriptions,care notes on thoracotomy given to pt and family. Explained and discussed d/c instructions and went over discharge med list. Family verbalized understanding. [name]

## 2011-11-23 NOTE — Progress Notes (Signed)
[address]            [place] [address]          [phone]     5 Days Post-Op  Procedure(s) (LRB): VIDEO ASSISTED THORACOSCOPY (Left) LOBECTOMY (Left) Subjective: Continues to feel stronger  Objective  Telemetry SR/SB  Temp:  [97.6 F (36.4 C)-98.4 F (36.9 C)] 97.6 F (36.4 C) (01/23 0310) Pulse Rate:  [51-70] 51  (01/23 0310) Resp:  [15-19] 15  (01/23 0310) BP: (106-155)/(52-87) 107/87 mmHg (01/23 0310) SpO2:  [96 %-100 %] 97 % (01/23 0310)   Intake/Output Summary (Last 24 hours) at 11/23/11 0743 Last data filed at 11/23/11 0400  Gross per 24 hour  Intake   1010 ml  Output    220 ml  Net    790 ml       General appearance: alert, cooperative and no distress Heart: regular rate and rhythm Lungs: mildly diminished in the bases L>R Abdomen: benign exam Extremities: no edema, redness or tenderness in the calves or thighs Wound: incisions healing well   Lab Results:  Basename 11/23/11 0430 11/21/11 0500  NA 142 137  K 3.4* 3.6  CL 106 106  CO2 27 26  GLUCOSE 110* 120*  BUN 12 8  CREATININE 0.90 0.77  CALCIUM 8.8 8.6  MG -- --  PHOS -- --   No results found for this basename: AST:2,ALT:2,ALKPHOS:2,BILITOT:2,PROT:2,ALBUMIN:2 in the last 72 hours No results found for this basename: LIPASE:2,AMYLASE:2 in the last 72 hours  Basename 11/23/11 0430 11/21/11 0500  WBC 5.3 6.3  NEUTROABS -- --  HGB 11.9* 11.8*  HCT 34.3* 33.6*  MCV 94.8 95.7  PLT 143* 117*   No results found for this basename: CKTOTAL:4,CKMB:4,TROPONINI:4 in the last 72 hours No components found with this basename: POCBNP:3 No results found for this basename: DDIMER in the last 72 hours No results found for this basename: HGBA1C in the last 72 hours No results found for this basename: CHOL,HDL,LDLCALC,TRIG,CHOLHDL in the last 72 hours No results found for this basename: TSH,T4TOTAL,FREET3,T3FREE,THYROIDAB in the last 72 hours No results found for  this basename: VITAMINB12,FOLATE,FERRITIN,TIBC,IRON,RETICCTPCT in the last 72 hours  Medications: Scheduled    . amiodarone  400 mg Oral BID  . aspirin  325 mg Oral Daily  . atenolol  50 mg Oral Q2000  . bisacodyl  10 mg Rectal Once  . lisinopril  5 mg Oral Daily  . LORazepam  1 mg Oral Once  . moxifloxacin  400 mg Oral q1800  . simvastatin  20 mg Oral q1800  . sodium chloride  10 mL Intravenous Q12H  . DISCONTD: bisacodyl  10 mg Oral Daily  . DISCONTD: moxifloxacin  400 mg Intravenous Q24H     Radiology/Studies:  Dg Chest Port 1 View  11/22/2011  *RADIOLOGY REPORT*  Clinical Data: Removal of left chest tube  PORTABLE CHEST - 1 VIEW  Comparison: Portable chest x-ray of 11/22/2011  Findings: The left chest tube has been removed. Only a tiny lateral left pneumothorax is again noted.  Pleural and parenchymal opacity remains on the left.  The right lung is clear and better aerated. Heart size is stable.  Left central venous line is unchanged in position.  IMPRESSION: Left chest tube removed. No significant change in the small left pneumothorax noted previously.  Original Report Authenticated By: Juline Patch, M.D.   Dg Chest  Port 1 View  11/22/2011  *RADIOLOGY REPORT*  Clinical Data: Chest tube evaluation.  PORTABLE CHEST - 1 VIEW  Comparison: Multiple priors, most recent 11/21/2011.  Findings: Left-sided chest tube is unchanged in position.  Pleural line is visible laterally at the level of the posterior third and fourth rib interspace.  A very small pneumothorax visualized, without significant change.  Left subclavian central venous catheter terminates in the left innominate vein, unchanged.  The lung volumes are low bilaterally.  Patchy left basilar opacities are unchanged.  There is peribronchial thickening bilaterally.  No focal opacities on the right.  No visible pleural effusion.  IMPRESSION:  1.  No significant change in very small left pneumothorax, with left chest tube in place. 2.   Persistent low lung volumes with left basilar opacities (atelectasis versus airspace disease) and bilateral pleural peribronchial thickening.  Original Report Authenticated By: Britta Mccreedy, M.D.    INR: Will add last result for INR, ABG once components are confirmed Will add last 4 CBG results once components are confirmed  Assessment/Plan: S/P Procedure(s) (LRB): VIDEO ASSISTED THORACOSCOPY (Left) LOBECTOMY (Left) 1. She appears stable for d/c.  3. Will decrease B Blocker dose, cont amio. 3.replace K+   LOS: 5 days    Yashar Inclan E 1/23/20137:43 AM

## 2011-11-23 NOTE — Discharge Summary (Signed)
[address]            [place] [address]          [phone]      Stephanie [name] 11-May-1941 71 y.o. [id number]  11/18/2011   Stephanie Slot, MD  LUL MASS left upper lobe mass  HPI: 71 year old woman hospitalized in November with a stroke which caused some speech difficulties and right hand weakness. During admission a chest x-ray question of a pneumonia versus a mass in the left upper lobe. She had a CT scan which confirmed a mass and also showed a questionably enlarged AP window nodes. She subsequently had a PET/CT which showed hypermetabolic uptake in the left upper lobe mass/infiltrate, the AP window node was not hypermetabolic in relation to the surrounding mediastinal uptake. Successfully had a transthoracic needle biopsy which showed a well-differentiated adenocarcinoma. She is a lifelong nonsmoker, she does have some exposure to secondhand smoke from working in a [employer], but no one in her home was a smoker.  Past Medical History   Diagnosis  Date   .  Hypertension    .  Arthritis    .  CVA (cerebral infarction)    .  Stroke    .  Hypercholesteremia     Past Surgical History   Procedure  Date   .  No past surgeries    .  Lung biopsy  10/13/11     left    Family History   Problem  Relation  Age of Onset   .  Stroke  Mother    .  Stroke  Sister     Social History  History   Substance Use Topics   .  Smoking status:  Never Smoker   .  Smokeless tobacco:  Never Used   .  Alcohol Use:  No    Current Outpatient Prescriptions   Medication  Sig  Dispense  Refill   .  aspirin 325 MG tablet  Take 1 tablet (325 mg total) by mouth daily.  30 tablet  0   .  atenolol (TENORMIN) 50 MG tablet  Take 50 mg by mouth daily at 8 pm.     .  lisinopril (PRINIVIL,ZESTRIL) 5 MG tablet  Take 1 tablet (5 mg total) by mouth daily.  30 tablet  0   .  simvastatin (ZOCOR) 20 MG tablet  Take 1 tablet (20 mg total) by mouth daily at 6 PM.  30  tablet  0    No Known Allergies  Review of Systems  Constitutional: Negative for fever, activity change, appetite change and unexpected weight change.  Back at work  Respiratory: Negative for cough, chest tightness, shortness of breath, wheezing and stridor.  Cardiovascular: Negative for chest pain and leg swelling.  Genitourinary: Negative.  Neurological: Positive for speech difficulty and weakness (right grip weak after stroke in November).  Hematological: Negative for adenopathy. Does not bruise/bleed easily.  All other systems reviewed and are negative.   BP 180/87  Pulse 60  Resp 20  Ht 5\' 7"  (1.702 m)  Wt 165 lb (74.844 kg)  BMI 25.84 kg/m2  SpO2 98%  Physical Exam  Vitals reviewed.  Constitutional: She is oriented to person, place, and time. She appears well-developed and well-nourished.  obese  HENT:  Head: Normocephalic and atraumatic.  Eyes: EOM are normal. Pupils are equal, round, and reactive  to light.  Neck: No JVD present. No tracheal deviation present. No thyromegaly present.  Cardiovascular: Normal rate, regular rhythm and normal heart sounds. Exam reveals no gallop and no friction rub.  No murmur heard.  Pulmonary/Chest: Effort normal and breath sounds normal. She has no wheezes. She has no rales.  Abdominal: Soft. There is no tenderness.  Lymphadenopathy:  She has no cervical adenopathy.  Neurological: She is alert and oriented to person, place, and time.  Skin: Skin is warm and dry.   Diagnostic Tests:  CT of chest and PET/CT reviewed findings as previously noted  Pulmonary function testing revealed an FVC of 1.48 (43% predicted), FEV1 was 1.43L (55% predicted), diffusion capacity was only 36% of predicted., But the DL/VA was 45% predicted. It should be noted that the pulmonary function testing was done while she was in the hospital, with a pneumothorax after her CT-guided biopsy.    She was admitted this hospitalization for left upper lobectomy with lymph  node dissection.  Procedure:  OPERATIVE REPORT  PREOPERATIVE DIAGNOSIS: Adenocarcinoma, left upper lobe.  POSTOPERATIVE DIAGNOSIS: Adenocarcinoma, left upper lobe.  PROCEDURE: Left video-assisted thoracoscopic left upper lobectomy with  mediastinal lymph node dissection, placement of On-Q local anesthetic  catheter.  SURGEON: Salvatore Decent. Dorris Fetch, MD  ASSISTANT: Rowe Clack, PA-C  ANESTHESIA: General.  FINDINGS: A 3-cm mass left upper lobe. Bronchial margins negative for  tumor. Multiple enlarged but otherwise benign-appearing lymph nodes  throughout, sent for permanent sections.  Postoperative hospital course:  Overall the patient has done nicely. She has had postoperative atrial fibrillation. She has been subsequently chemically cardioverted to a sinus rhythm with amiodarone. She has had some bradycardia dysrhythmias associated with this and her beta blocker dosing has been decreased. Currently her heart rate is mostly in the mid 60s. She has had some postoperative infiltrative process primarily in the left lung which is improving over time with aggressive pulmonary toilet and rehabilitation. Oxygen has been weaned and she maintains good saturations on room air. She has been started on Avelox as a precaution and we'll continue a 10 day course at discharge. Incisions are healing well without evidence of infection. She is tolerating diet. Overall her status is felt to be stable for discharge on today's date.  Basename 11/23/11 0430 11/21/11 0500  NA 142 137  K 3.4* 3.6  CL 106 106  CO2 27 26  GLUCOSE 110* 120*  BUN 12 8  CALCIUM 8.8 8.6    Basename 11/23/11 0430 11/21/11 0500  WBC 5.3 6.3  HGB 11.9* 11.8*  HCT 34.3* 33.6*  PLT 143* 117*   No results found for this basename: INR:2 in the last 72 hours   Discharge Instructions:  The patient is discharged to home with extensive instructions on wound care and progressive ambulation.  They are instructed not to drive or perform  any heavy lifting until returning to see the physician in his office.  Discharge Diagnosis:  LUL MASS left upper lobe mass  Secondary Diagnosis: Patient Active Problem List  Diagnoses  . CVA (cerebral infarction)  . HTN (hypertension)  . Pneumothorax of left lung after biopsy  . Adenocarcinoma   Past Medical History  Diagnosis Date  . CVA (cerebral infarction)     speech difficulities  . Stroke   . Hypercholesteremia     Takes Zocor daily  . Hypertension     takes Atenolol and Lisinopril daily  . Lung mass     left upper lobe  . Arthritis  hands  . TIA (transient ischemic attack)     Sat before thanksgiving;impaired speech  . Pneumothorax        [place], [place]  Home Medication Instructions [id number]   Printed on:11/23/11 0809  Medication Information                    aspirin 325 MG tablet Take 1 tablet (325 mg total) by mouth daily.           simvastatin (ZOCOR) 20 MG tablet Take 1 tablet (20 mg total) by mouth daily at 6 PM.           lisinopril (PRINIVIL,ZESTRIL) 5 MG tablet Take 1 tablet (5 mg total) by mouth daily.           amiodarone (PACERONE) 400 MG tablet Take 1 tablet (400 mg total) by mouth 2 (two) times daily. Take 400 mg twice daily for 7 days ;then 400 mg once daily           dextromethorphan-guaiFENesin (MUCINEX DM) 30-600 MG per 12 hr tablet Take 1 tablet by mouth 2 (two) times daily as needed.           oxyCODONE-acetaminophen (PERCOCET) 5-325 MG per tablet Take 1-2 tablets by mouth every 4 (four) hours as needed.           atenolol (TENORMIN) 50 MG tablet Take 0.5 tablets (25 mg total) by mouth daily at 8 pm.           moxifloxacin (AVELOX) 400 MG tablet Take 1 tablet (400 mg total) by mouth daily at 6 PM.             Disposition: For discharge home  Patient's condition is Good  Gershon Crane, PA-C 11/23/2011  8:09 AM

## 2011-11-23 NOTE — Progress Notes (Signed)
.  cm Stephanie [name] [phone] 11/23/2011

## 2011-11-30 ENCOUNTER — Ambulatory Visit (INDEPENDENT_AMBULATORY_CARE_PROVIDER_SITE_OTHER): Payer: Self-pay

## 2011-11-30 VITALS — BP 164/79 | HR 60

## 2011-11-30 DIAGNOSIS — R918 Other nonspecific abnormal finding of lung field: Secondary | ICD-10-CM

## 2011-11-30 DIAGNOSIS — I2510 Atherosclerotic heart disease of native coronary artery without angina pectoris: Secondary | ICD-10-CM

## 2011-11-30 DIAGNOSIS — Z902 Acquired absence of lung [part of]: Secondary | ICD-10-CM

## 2011-11-30 NOTE — Patient Instructions (Signed)
Post op appt with Dr Dorris Fetch on 12/12/11 @ 1300 with CXR prior.

## 2011-11-30 NOTE — Progress Notes (Signed)
Removed 3 sutures from chest tube sites. No signs of infection. Pt tolerated well.

## 2011-12-07 ENCOUNTER — Other Ambulatory Visit: Payer: Self-pay | Admitting: Thoracic Surgery (Cardiothoracic Vascular Surgery)

## 2011-12-07 DIAGNOSIS — D381 Neoplasm of uncertain behavior of trachea, bronchus and lung: Secondary | ICD-10-CM

## 2011-12-12 ENCOUNTER — Ambulatory Visit (INDEPENDENT_AMBULATORY_CARE_PROVIDER_SITE_OTHER): Payer: Self-pay | Admitting: Physician Assistant

## 2011-12-12 ENCOUNTER — Ambulatory Visit
Admission: RE | Admit: 2011-12-12 | Discharge: 2011-12-12 | Disposition: A | Discharge status: Home / Self Care | Payer: Medicare HMO | Source: Ambulatory Visit | Attending: Thoracic Surgery (Cardiothoracic Vascular Surgery) | Admitting: Thoracic Surgery (Cardiothoracic Vascular Surgery)

## 2011-12-12 VITALS — BP 158/79 | HR 60 | Resp 16 | Ht 66.0 in | Wt 177.5 lb

## 2011-12-12 DIAGNOSIS — C3410 Malignant neoplasm of upper lobe, unspecified bronchus or lung: Secondary | ICD-10-CM

## 2011-12-12 DIAGNOSIS — D381 Neoplasm of uncertain behavior of trachea, bronchus and lung: Secondary | ICD-10-CM

## 2011-12-12 DIAGNOSIS — Z09 Encounter for follow-up examination after completed treatment for conditions other than malignant neoplasm: Secondary | ICD-10-CM

## 2011-12-12 NOTE — Progress Notes (Signed)
                    [address]            Stephanie [name] [address]          [phone]     HPI: Patient returns for routine postoperative follow-up having undergone a left VATS with left upper lobectomy by Dr. Dorris Fetch on 11/18/2011 . The patient's postoperative course was notable for atrial fibrillation which converted to normal sinus rhythm on amiodarone. She had initially been started on her home dose of atenolol as well, however she developed some bradycardia, and her beta blocker dosage was decreased while she was on the amiodarone. She also developed a productive cough with an infiltrate in the left lung on chest x-ray. She was discharged home on Avelox x10 days. Overall she did well postoperatively and was able to be discharged home on 11/23/2011 in good condition. 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  Since hospital discharge the patient has done fairly well.. Her cough is resolved and her breathing has been stable. She denies any chest discomfort, and is no longer taking pain medication. She has had no further arrhythmias and according to her daughter, she self discontinued the amiodarone once the initial prescription was completed. She's also completed her course of Avelox.  Current Outpatient Prescriptions  Medication Sig Dispense Refill  . amiodarone (PACERONE) 400 MG tablet Take 1 tablet (400 mg total) by mouth 2 (two) times daily. Take 400 mg twice daily for 7 days ;then 400 mg once daily  70 tablet  1  . aspirin 325 MG tablet Take 1 tablet (325 mg total) by mouth daily.  30 tablet  0  . atenolol (TENORMIN) 50 MG tablet Take 0.5 tablets (25 mg total) by mouth daily at 8 pm.  30 tablet  1  . lisinopril (PRINIVIL,ZESTRIL) 5 MG tablet Take 1 tablet (5 mg total) by mouth daily.  30 tablet  0  . Melatonin 5 MG TABS Take 1 tablet by mouth at bedtime as needed. 1 or 2 tabs at bedtime prn      . simvastatin (ZOCOR) 20 MG tablet Take 1 tablet  (20 mg total) by mouth daily at 6 PM.  30 tablet  0    Physical Exam: BP 158/79 HR 60 Sats: 96% RA Wounds: clean and dry Heart: RRR Lungs: clear   Diagnostic Tests: Chest xray: Stable small left effusion  Assessment/Plan: Mrs. Dettinger has done well status post left upper lobectomy. She is maintained a normal sinus rhythm and since she has completed her amiodarone, we will not refill it at this time. Also, her blood pressures have begun to trend upward and we will increase her back to her home dose of atenolol at this point. She is otherwise doing well. She may begin to drive and increase her activity at this point. 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 I think this will be fine and I have written her a note to return to work on March 11, which is approximately 7 weeks postop. She will need oncology followup, and this will be arranged. We will see her back as needed.

## 2011-12-26 ENCOUNTER — Other Ambulatory Visit: Payer: Self-pay | Admitting: *Deleted

## 2011-12-26 DIAGNOSIS — R05 Cough: Secondary | ICD-10-CM

## 2011-12-26 MED ORDER — HYDROCOD POLST-CHLORPHEN POLST 10-8 MG/5ML PO LQCR: 5.0000 mL | Freq: Two times a day (BID) | ORAL | Status: DC | PRN

## 2012-01-02 ENCOUNTER — Encounter (HOSPITAL_COMMUNITY): Payer: Medicare HMO | Attending: Oncology | Admitting: Oncology

## 2012-01-02 ENCOUNTER — Encounter (HOSPITAL_COMMUNITY): Payer: Self-pay | Admitting: Oncology

## 2012-01-02 VITALS — BP 186/83 | HR 56 | Wt 173.7 lb

## 2012-01-02 DIAGNOSIS — E7800 Pure hypercholesterolemia, unspecified: Secondary | ICD-10-CM | POA: Insufficient documentation

## 2012-01-02 DIAGNOSIS — C801 Malignant (primary) neoplasm, unspecified: Secondary | ICD-10-CM

## 2012-01-02 DIAGNOSIS — E876 Hypokalemia: Secondary | ICD-10-CM | POA: Insufficient documentation

## 2012-01-02 DIAGNOSIS — I10 Essential (primary) hypertension: Secondary | ICD-10-CM | POA: Insufficient documentation

## 2012-01-02 DIAGNOSIS — C3410 Malignant neoplasm of upper lobe, unspecified bronchus or lung: Secondary | ICD-10-CM | POA: Insufficient documentation

## 2012-01-02 DIAGNOSIS — I69922 Dysarthria following unspecified cerebrovascular disease: Secondary | ICD-10-CM | POA: Insufficient documentation

## 2012-01-02 LAB — COMPREHENSIVE METABOLIC PANEL
ALT: 10 U/L (ref 0–35)
AST: 22 U/L (ref 0–37)
Albumin: 3.6 g/dL (ref 3.5–5.2)
Calcium: 9.8 mg/dL (ref 8.4–10.5)
Creatinine, Ser: 1.47 mg/dL — ABNORMAL HIGH (ref 0.50–1.10)
Sodium: 139 mEq/L (ref 135–145)

## 2012-01-02 LAB — CBC
MCH: 32.1 pg (ref 26.0–34.0)
MCV: 95.7 fL (ref 78.0–100.0)
Platelets: 183 10*3/uL (ref 150–400)
RDW: 12.6 % (ref 11.5–15.5)
WBC: 4.7 10*3/uL (ref 4.0–10.5)

## 2012-01-02 NOTE — Progress Notes (Signed)
This office note has been dictated.

## 2012-01-02 NOTE — Progress Notes (Signed)
CC:   Stephanie [name]. Stephanie Gambler, MD Stephanie [name], M.D.  DIAGNOSES: 1. 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  Charlett Lango on 11/18/2011.  It  was done     by left video-assisted thorascopic approach with mediastinal lymph     node dissection along with his left upper lobectomy.  This was done     for a 3-4 cm mass in left upper lobe picked up in November when she     was admitted to this hospital with a stroke.  Two other small     nodules were noted in the right lung 5 mm and 4 mm which were not     diagnostic but worrisome. 2. History of hypertension. 3. Hypercholesterolemia.  HISTORY OF PRESENT ILLNESS:  This is a very pleasant 71 year old Caucasian lady who lives here in [place].  She does live by herself since her husband died in Mar 31, 2002.  Her children live in the area; one in [place], one in [place], and one in the [place] community area.  They are all alive in good health, but she was found to have this stroke leaving her with minimal speech abnormalities and residual weakness in the right arm and leg.  That was in November and at that time she was found to have an abnormality on a chest x-ray which led to the CT scan. The chest x-ray was done on admission and was found have an ill-defined peripheral airspace opacity in left mid-lung.  That then led to the CT scan mentioned above.  She also had a PET scan which showed this left upper lobe ground-glass mass-like lesion with low metabolic activity and there was no evidence for distant metastatic disease at that time.  No nodes were obviously hypermetabolic, either.  She is here today with her daughter, Stephanie [name], for a consultation.  She was found have mild hypokalemia on one of her recent lab tests in January and we will repeat her labs today.  MEDICATIONS:  She takes simvastatin for presumed  hypercholesterolemia. She also takes lisinopril and atenolol, and she takes an aspirin every day.  She has been trying melatonin 5 mg 1-2 tablets at night for sleep but they have not really helped.  But, in talking to her, her sleep dysfunction goes back to the death of her husband.  SOCIAL HISTORY:  She has never been a smoker.  She is not a drinker. 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  REVIEW OF SYSTEMS:  Right now is noncontributory.  EXAM:  General:  Very pleasant lady, slow to speak, deliberate, has slight slow rate, I think, of her words, AND she does have residual white right arm shoulder grip and leg weakness on the right.  The left is very intact.  There is no facial weakness that I can appreciate.  The dysarthria is more obvious.  She has pupils which are equally round and reactive to light.  She has upper and lower dental plates.  Throat was clear.  Skin:  There is a little rash underneath her left breast consistent with early candida and her daughter will get some Micatin spray or powder and keep it dry with some corn starch twice a day in addition for 7-14 days.  Lungs:  A few rales at both bases.  They do not clear with coughing.  She appears somewhat weak in  general.  I should add, her incisions are well-healed.  Breast Exam:  Negative for masses and her daughter states she has not had a mammogram in several years. She did not breastfeed her children that I am aware of.  Heart:  Regular rhythm and rate without murmur, rub, or gallop.  Abdomen:  Soft and nontender without organomegaly.  Bowel sounds are diminished but present.  Extremities:  She has no peripheral edema.  She has had vein stripping on both legs.  Some of the scars are present and there are some nonspecific skin lesions on her forearms and lower legs for which she sees Dr. Margo Aye, but there are no obvious cancers presently.  She also has no adenopathy in the cervical, supraclavicular,  infraclavicular, axillary, or inguinal areas.  The neurological exam has been mentioned.  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  She has no distinct unequivocal metastatic disease, but those nodules in the lung need to be watched on a followup CT scan which we will do in 6 months.  They are only 5 mm and 4 mm, but need to be watched since she may well produce other bronchoalveolar cancer-like lesions.  I do not think she is a great candidate for adjuvant chemotherapy with stage IB disease only and the recent stroke, and the lack of complete resolution.  She certainly does not appear to be an adjuvant erlotinib candidate and I do not know of any study that promotes that usage.  I would like to see her back in 6 months.  We will do a CT scan followup before seeing her and I will discuss her with Dr. Margo Common to see if he has any other thoughts.  The daughter will call us if any questions.  In the meantime, she will try Tylenol PM for sleep.  I think that is all we should try at this juncture.  She will call us if it is not helping but it sounds like this is a longstanding issue and possibly related to mild depression over the loss of her best friend and husband years ago.  We will see her back as mentioned.    ______________________________ Ladona Horns. Mariel Sleet, MD ESN/MEDQ  D:  01/02/2012  T:  01/02/2012  Job:  [id number]

## 2012-01-02 NOTE — Progress Notes (Signed)
Stephanie [name] presented for labwork. Labs per MD order drawn via Peripheral Line 25 gauge needle inserted in rt ac.  Good blood return present. Procedure without incident.  Needle removed intact. Patient tolerated procedure well.

## 2012-01-02 NOTE — Patient Instructions (Signed)
[facility]  Discharge Instructions  RECOMMENDATIONS MADE BY THE CONSULTANT AND ANY TEST RESULTS WILL BE SENT TO YOUR REFERRING DOCTOR.   EXAM FINDINGS BY MD TODAY AND SIGNS AND SYMPTOMS TO REPORT TO CLINIC OR PRIMARY MD: You have a well-differentiated Stage 1 B-not related to smoking. Your cancer is rare and has a gene mutation.  Chemotherapy not recomended. If it ever comes back there is a pill that we would treat you with called erlotinib or eressa.  MEDICATIONS PRESCRIBED: try tylenol pm 1 at bedtime and if this does not work you may try 2 at bedtime. Let us know if this does not work well. You may use and anti-fungal powder and cornstarch under your breast. INSTRUCTIONS GIVEN AND DISCUSSED:  CT scan in 6 months then to see Dr.    I acknowledge that I have been informed and understand all the instructions given to me and received a copy. I do not have any more questions at this time, but understand that I may call the Specialty Clinic at [facility] at [phone] during business hours should I have any further questions or need assistance in obtaining follow-up care.    __________________________________________  _____________  __________ Signature of Patient or Authorized Representative            Date                   Time    __________________________________________ Nurse's Signature

## 2012-01-10 NOTE — Progress Notes (Signed)
I discussed her case with 2 physician colleagues who do not know of any protocols that are available for this woman with bronchoalveolar carcinoma  stage1. Therefore we will just watch her see her back in followup as discussed and I will talk to her daughter about the followup.  Randall An

## 2012-03-22 ENCOUNTER — Encounter (HOSPITAL_COMMUNITY): Payer: Medicare HMO | Attending: Oncology | Admitting: Oncology

## 2012-03-22 VITALS — BP 163/84 | HR 67 | Temp 97.9°F | Wt 169.1 lb

## 2012-03-22 DIAGNOSIS — G8918 Other acute postprocedural pain: Secondary | ICD-10-CM

## 2012-03-22 DIAGNOSIS — C3410 Malignant neoplasm of upper lobe, unspecified bronchus or lung: Secondary | ICD-10-CM | POA: Insufficient documentation

## 2012-03-22 DIAGNOSIS — I10 Essential (primary) hypertension: Secondary | ICD-10-CM | POA: Insufficient documentation

## 2012-03-22 DIAGNOSIS — E876 Hypokalemia: Secondary | ICD-10-CM | POA: Insufficient documentation

## 2012-03-22 DIAGNOSIS — C801 Malignant (primary) neoplasm, unspecified: Secondary | ICD-10-CM

## 2012-03-22 DIAGNOSIS — E7800 Pure hypercholesterolemia, unspecified: Secondary | ICD-10-CM | POA: Insufficient documentation

## 2012-03-22 DIAGNOSIS — I69922 Dysarthria following unspecified cerebrovascular disease: Secondary | ICD-10-CM | POA: Insufficient documentation

## 2012-03-22 DIAGNOSIS — G629 Polyneuropathy, unspecified: Secondary | ICD-10-CM

## 2012-03-22 NOTE — Progress Notes (Signed)
Stephanie [name] is seen as a work-in today.  She is due to see Dr. Mariel Sleet for a 6 month follow-up in September following a 6 month follow-up CT of 2 lung nodules in the opposite lung.    Ms. 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 Charlett Lango on 11/18/2011. It was done by left video-assisted thorascopic approach with mediastinal lymph node dissection along with his left upper lobectomy. This was done for a 3-4 cm mass in left upper lobe picked up in November when she was admitted to this hospital with a stroke. Two other small nodules were noted in the right lung 5 mm and 4 mm which were not diagnostic but worrisome.  She is seen today for left sided pain following her thoracotomy.  She reports an intermittent, sharp, stabbing pain that occurs randomly without any pattern.  She reports that it follows the T4-6 dermatome.  She denies any rashes.  She denies anything alleviating or aggravating the discomfort.  She has not tried anything at home to make it better.    Objective: BP 163/84  Pulse 67  Temp(Src) 97.9 F (36.6 C) (Oral)  Wt 169 lb 1.6 oz (76.703 kg) General: Pleasant.  A and O x 3.  NAD.  Slow speech secondary to stroke. HEENT: Atraumatic, normocephalic, anicteric Neck: Supple, trachea midline Cardiac: RRR without murmur rub or gallop.  Resonant to percussion. Lungs: CTA B/L without wheezes rales or rhonchi Thorax: No rashes or abnormalities noted. No tenderness to palpation Breast: No nipple discharge, nipple inversion, erythema, lesions, or masses noted in the left breast. Back: No tenderness to palpation. Extremities: No LE edema, erythema, or heat Skin: Warm and dry Neuro: A and O x 3 with slow speech secondary to stroke and slow mentation.  Assessment:  1. Post-thoracotomy pain 2. 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 Charlett Lango on 11/18/2011. It was done by left video-assisted thorascopic approach with mediastinal lymph node dissection along with his left upper lobectomy. This was done for a 3-4 cm mass in left upper lobe picked up in November when she was admitted to this hospital with a stroke. Two other small nodules were noted in the right lung 5 mm and 4 mm which were not diagnostic but worrisome.  Plan: 1. Patient encouraged to use Aleve or Ibuprofen for the pain.  She may try tylenol as well.  2. Patient education regarding thoracotomy neuropathy including etiology and treatment.   3. This should improve in time, but it may not.  4. Patient is to inform us on her return visit how this discomfort is doing.   5. Return as scheduled following 6 month CT scan of chest.  All questions were answered.  The patient knows to call the clinic with any questions or concerns.   Shontay Wallner

## 2012-03-23 ENCOUNTER — Ambulatory Visit (HOSPITAL_COMMUNITY): Payer: Medicare HMO | Admitting: Oncology

## 2012-07-13 ENCOUNTER — Other Ambulatory Visit (HOSPITAL_COMMUNITY): Payer: Self-pay | Admitting: Oncology

## 2012-07-13 DIAGNOSIS — C801 Malignant (primary) neoplasm, unspecified: Secondary | ICD-10-CM

## 2012-07-13 MED ORDER — ALPRAZOLAM 0.5 MG PO TABS: ORAL_TABLET | ORAL | Status: DC

## 2012-07-16 ENCOUNTER — Ambulatory Visit (HOSPITAL_COMMUNITY)
Admission: RE | Admit: 2012-07-16 | Discharge: 2012-07-16 | Disposition: A | Discharge status: Home / Self Care | Payer: Medicare HMO | Source: Ambulatory Visit | Attending: Oncology | Admitting: Oncology

## 2012-07-16 DIAGNOSIS — C3490 Malignant neoplasm of unspecified part of unspecified bronchus or lung: Secondary | ICD-10-CM | POA: Insufficient documentation

## 2012-07-16 DIAGNOSIS — R918 Other nonspecific abnormal finding of lung field: Secondary | ICD-10-CM | POA: Insufficient documentation

## 2012-07-16 DIAGNOSIS — C801 Malignant (primary) neoplasm, unspecified: Secondary | ICD-10-CM

## 2012-07-17 ENCOUNTER — Encounter (HOSPITAL_COMMUNITY): Payer: Medicare HMO | Attending: Oncology | Admitting: Oncology

## 2012-07-17 VITALS — BP 131/68 | HR 55 | Temp 97.9°F | Resp 18 | Wt 162.0 lb

## 2012-07-17 DIAGNOSIS — C801 Malignant (primary) neoplasm, unspecified: Secondary | ICD-10-CM

## 2012-07-17 DIAGNOSIS — R918 Other nonspecific abnormal finding of lung field: Secondary | ICD-10-CM

## 2012-07-17 DIAGNOSIS — C3410 Malignant neoplasm of upper lobe, unspecified bronchus or lung: Secondary | ICD-10-CM

## 2012-07-17 NOTE — Progress Notes (Signed)
Problem #1 stage IB (T2 A. N0) well-differentiated adenocarcinoma consistent with a bronchoalveolar carcinoma the left lung status post resection by Dr. Edwina Barth on 11/18/2011. There is no evidence for recurrence at this time though she has small lesions on the right lung which still need observation. 2 are felt to be new but may have been there in November 2012 CAT scan. One lesion is 6 mm and appears slightly larger. 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 She has seen Dr. Delton Coombes in the past but she is not symptomatic from her lungs at this time. I suspect she just needs to be observed since she is asymptomatic but she does have rales at both lung bases bilaterally anteriorly and posteriorly and laterally. Problem #3 history of a stroke  Her vital signs are stable her daughter is with her today and she is still working 2 days week at the [employer] She still drives but has trouble speaking because of her stroke. She knows which she same however but has difficulty getting the words out and still has dysarthria.  Her lungs show rales as I mentioned but no rubs a small wheeze on the right intermittently in the right midlung. She has not had a fever and isn't coughing up any phlegm.  She still needs observation and will come back in 6 months with another CT scan of the chest without contrast. I tried to reassure her that she was upset over the news that she still had a couple small nodules that need observation. I asked her to discuss anything with her daughter when she gets home and call back with any questions. Her daughter will try call me. It is of note that she did have an EGFR deletion of exon 19. She does not however justify therapy with her Erlotinib presently

## 2012-07-17 NOTE — Patient Instructions (Addendum)
[facility]  Discharge Instructions Stephanie [name]  DOB February 24, 1941 CSN [id number]  MRN [id number] Dr. Glenford Peers  RECOMMENDATIONS MADE BY THE CONSULTANT AND ANY TEST RESULTS WILL BE SENT TO YOUR REFERRING DOCTOR.   EXAM FINDINGS BY MD TODAY AND SIGNS AND SYMPTOMS TO REPORT TO CLINIC OR PRIMARY MD:  Exam per Dr. Mariel Sleet and ct scans reviewed. No real changes on CT scan and nothing to treat at this time.   INSTRUCTIONS GIVEN AND DISCUSSED: Ct scan in 6 months then to see Dr. Mariel Sleet    I acknowledge that I have been informed and understand all the instructions given to me and received a copy. I do not have any more questions at this time, but understand that I may call the Specialty Clinic at [facility] at [phone] during business hours should I have any further questions or need assistance in obtaining follow-up care.    __________________________________________  _____________  __________ Signature of Patient or Authorized Representative            Date                   Time    __________________________________________ Nurse's Signature

## 2012-08-17 ENCOUNTER — Emergency Department (HOSPITAL_COMMUNITY): Payer: Medicare HMO

## 2012-08-17 ENCOUNTER — Emergency Department (HOSPITAL_COMMUNITY)
Admission: EM | Admit: 2012-08-17 | Discharge: 2012-08-17 | Disposition: A | Discharge status: Home / Self Care | Payer: Medicare HMO | Attending: Emergency Medicine | Admitting: Emergency Medicine

## 2012-08-17 ENCOUNTER — Encounter (HOSPITAL_COMMUNITY): Payer: Self-pay | Admitting: *Deleted

## 2012-08-17 DIAGNOSIS — M79603 Pain in arm, unspecified: Secondary | ICD-10-CM

## 2012-08-17 DIAGNOSIS — M79609 Pain in unspecified limb: Secondary | ICD-10-CM | POA: Insufficient documentation

## 2012-08-17 DIAGNOSIS — R5383 Other fatigue: Secondary | ICD-10-CM | POA: Insufficient documentation

## 2012-08-17 DIAGNOSIS — G939 Disorder of brain, unspecified: Secondary | ICD-10-CM | POA: Insufficient documentation

## 2012-08-17 DIAGNOSIS — R5381 Other malaise: Secondary | ICD-10-CM | POA: Insufficient documentation

## 2012-08-17 DIAGNOSIS — I69928 Other speech and language deficits following unspecified cerebrovascular disease: Secondary | ICD-10-CM | POA: Insufficient documentation

## 2012-08-17 DIAGNOSIS — R4789 Other speech disturbances: Secondary | ICD-10-CM | POA: Insufficient documentation

## 2012-08-17 DIAGNOSIS — I10 Essential (primary) hypertension: Secondary | ICD-10-CM | POA: Insufficient documentation

## 2012-08-17 LAB — CBC WITH DIFFERENTIAL/PLATELET
Basophils Absolute: 0 10*3/uL (ref 0.0–0.1)
Basophils Relative: 1 % (ref 0–1)
HCT: 34.9 % — ABNORMAL LOW (ref 36.0–46.0)
Hemoglobin: 12 g/dL (ref 12.0–15.0)
Lymphs Abs: 1.1 10*3/uL (ref 0.7–4.0)
MCH: 32 pg (ref 26.0–34.0)
MCV: 93.1 fL (ref 78.0–100.0)
Monocytes Absolute: 0.5 10*3/uL (ref 0.1–1.0)
Monocytes Relative: 10 % (ref 3–12)
RBC: 3.75 MIL/uL — ABNORMAL LOW (ref 3.87–5.11)

## 2012-08-17 LAB — TROPONIN I: Troponin I: <0.3 ng/mL (ref <0.30)

## 2012-08-17 NOTE — ED Provider Notes (Signed)
History   This chart was scribed for [name], MD by Sofie Rower. The patient was seen in room APA10/APA10 and the patient's care was started at 7:20PM    CSN: [id number]  Arrival date & time 08/17/12  1308   First MD Initiated Contact with Patient 08/17/12 1920      Chief Complaint  Patient presents with  . Arm Pain    (Consider location/radiation/quality/duration/timing/severity/associated sxs/prior treatment) Patient is a 71 y.o. female presenting with extremity pain. The history is provided by the patient and a relative. No language interpreter was used.  Extremity Pain This is a new problem. The current episode started 2 days ago. The problem occurs constantly. The problem has been gradually worsening. Pertinent negatives include no chest pain and no abdominal pain. The symptoms are aggravated by bending and twisting. The symptoms are relieved by rest and position. She has tried nothing for the symptoms. The treatment provided no relief.    Stephanie [name] is a 71 y.o. female , with a hx of stroke (November, 2012), who presents to the Emergency Department complaining of sudden, progressively worsening, arm pain located at the left arm, onset two days ago (08/14/12). The pt reports that she often performs heavy lifting activities, utilizing her left arm while at work. The pt informs that she is concerned she may have injured her left shoulder. Additionally, the pt reports her speech has not been well since her last stroke, becoming progressively worse over the past two weeks, generating a familial concern that she may have had another stroke episode. Modifying factors include lying flat which provides relief of the left arm pain, and certain movements and positions of the left arm which intensifies the arm pain. The pt has a hx of left leg cellulitis (for which she is taking antibiotics at present).   The pt denies chest pain, abdominal pain, neck pain, back pain, cough, and  dysuria.  The pt does not smoke or drink alcohol.   PCP is Dr. Lilyan Punt.    Past Medical History  Diagnosis Date  . CVA (cerebral infarction)     speech difficulities  . Stroke   . Hypercholesteremia     Takes Zocor daily  . Hypertension     takes Atenolol and Lisinopril daily  . Lung mass     left upper lobe  . Arthritis     hands  . TIA (transient ischemic attack)     Sat before thanksgiving;impaired speech  . Pneumothorax   . Lung cancer     Past Surgical History  Procedure Date  . No past surgeries   . Lung biopsy 10/13/11    left  . Abdominal hysterectomy 1975  . Varicose vein surgery 13yrs ago  . Breast biopsy early 2000  . Video assisted thoracoscopy 11/18/2011    Procedure: VIDEO ASSISTED THORACOSCOPY;  Surgeon: [name], MD;  Location: [facility] OR;  Service: Thoracic;  Laterality: Left;  . Lobectomy 11/18/2011    Procedure: LOBECTOMY;  Surgeon: [name], MD;  Location: [facility] OR;  Service: Thoracic;  Laterality: Left;  LEFT UPPER LOBECTOMY    Family History  Problem Relation Age of Onset  . Stroke Mother   . Stroke Sister   . Anesthesia problems Neg Hx   . Hypotension Neg Hx   . Malignant hyperthermia Neg Hx   . Pseudochol deficiency Neg Hx   . Diabetes Brother   . Cancer Brother   . Stroke Brother  History  Substance Use Topics  . Smoking status: Never Smoker   . Smokeless tobacco: Never Used  . Alcohol Use: No    OB History    Grav Para Term Preterm Abortions TAB SAB Ect Mult Living                  Review of Systems  Cardiovascular: Negative for chest pain.  Gastrointestinal: Negative for abdominal pain.  All other systems reviewed and are negative.    Allergies  Review of patient's allergies indicates no known allergies.  Home Medications   Current Outpatient Rx  Name Route Sig Dispense Refill  . ASPIRIN 325 MG PO TABS Oral Take 325 mg by mouth every morning.    . ASPIRIN EC 81 MG PO TBEC Oral Take 81 mg  by mouth every morning.    . ATENOLOL 50 MG PO TABS Oral Take 50 mg by mouth daily at 8 pm.    . CEPHALEXIN 500 MG PO CAPS Oral Take 500 mg by mouth 4 (four) times daily.    Marland Kitchen CIPROFLOXACIN HCL 500 MG PO TABS Oral Take 500 mg by mouth 2 (two) times daily.    [name] EX Apply externally Apply 1 application topically daily as needed. For itching    . LISINOPRIL 5 MG PO TABS Oral Take 5 mg by mouth every morning.    Marland Kitchen SIMVASTATIN 20 MG PO TABS Oral Take 20 mg by mouth every evening.      BP 152/73  Pulse 62  Temp 97.8 F (36.6 C) (Oral)  Resp 18  Ht 5\' 7"  (1.702 m)  Wt 150 lb (68.04 kg)  BMI 23.49 kg/m2  SpO2 99%  Physical Exam  Nursing note and vitals reviewed. Constitutional: She appears well-developed and well-nourished.  HENT:  Head: Atraumatic.  Right Ear: External ear normal.  Left Ear: External ear normal.  Nose: Nose normal.  Mouth/Throat: Oropharynx is clear and moist.  Eyes: Conjunctivae normal and EOM are normal.  Neck: Normal range of motion.  Cardiovascular: Normal rate, regular rhythm and normal heart sounds.   Pulmonary/Chest: Effort normal and breath sounds normal.  Abdominal: Soft. Bowel sounds are normal. There is no tenderness.  Musculoskeletal: Normal range of motion. She exhibits no tenderness.       Good ROM of left shoulder. Radial pulses 2 +.Left leg cellulitis detected. Redness up 2/3 of the leg. Capillary refill is 2 seconds.   Neurological: She is alert.  Skin: Skin is warm and dry.  Psychiatric: She has a normal mood and affect. Her behavior is normal.    ED Course  Procedures (including critical care time)  DIAGNOSTIC STUDIES: Oxygen Saturation is 99% on room air, normal by my interpretation.    COORDINATION OF CARE:    7:46PM- Treatment plan concerning CT scan, chest x-ray, and EKG discussed with patient. Pt agrees with treatment.   Results for orders placed during the hospital encounter of 08/17/12  CBC WITH DIFFERENTIAL       Component Value Range   WBC 4.6  4.0 - 10.5 K/uL   RBC 3.75 (*) 3.87 - 5.11 MIL/uL   Hemoglobin 12.0  12.0 - 15.0 g/dL   HCT 02.7 (*) 25.3 - 66.4 %   MCV 93.1  78.0 - 100.0 fL   MCH 32.0  26.0 - 34.0 pg   MCHC 34.4  30.0 - 36.0 g/dL   RDW 40.3  47.4 - 25.9 %   Platelets 162  150 - 400 K/uL  Neutrophils Relative 63  43 - 77 %   Neutro Abs 2.9  1.7 - 7.7 K/uL   Lymphocytes Relative 24  12 - 46 %   Lymphs Abs 1.1  0.7 - 4.0 K/uL   Monocytes Relative 10  3 - 12 %   Monocytes Absolute 0.5  0.1 - 1.0 K/uL   Eosinophils Relative 2  0 - 5 %   Eosinophils Absolute 0.1  0.0 - 0.7 K/uL   Basophils Relative 1  0 - 1 %   Basophils Absolute 0.0  0.0 - 0.1 K/uL  BASIC METABOLIC PANEL      Component Value Range   Sodium 132 (*) 135 - 145 mEq/L   Potassium 3.6  3.5 - 5.1 mEq/L   Chloride 101  96 - 112 mEq/L   CO2 24  19 - 32 mEq/L   Glucose, Bld 108 (*) 70 - 99 mg/dL   BUN 16  6 - 23 mg/dL   Creatinine, Ser 1.61 (*) 0.50 - 1.10 mg/dL   Calcium 9.6  8.4 - 09.6 mg/dL   GFR calc non Af Amer 42 (*) >90 mL/min   GFR calc Af Amer 49 (*) >90 mL/min  TROPONIN I      Component Value Range   Troponin I <0.30  <0.30 ng/mL   Dg Chest 2 View  08/17/2012  *RADIOLOGY REPORT*  Clinical Data: Arm pain  CHEST - 2 VIEW  Comparison: 12/12/2011  Findings: Cardiomediastinal silhouette is stable.  Stable volume loss probable left upper lobectomy.  Stable chronic interstitial prominence and reticular interstitial markings left mid and lower lung.  No focal infiltrate or convincing pulmonary edema.  Stable degenerative changes thoracic spine.  IMPRESSION:  Stable volume loss probable left upper lobectomy.  Stable chronic interstitial prominence and reticular interstitial markings left mid and lower lung.  No focal infiltrate or convincing pulmonary edema.  Stable degenerative changes thoracic spine.   Original Report Authenticated By: Natasha Mead, M.D.    Ct Head Wo Contrast  08/17/2012  *RADIOLOGY REPORT*  Clinical  Data: Arm pain, weakness, slurred speech  CT HEAD WITHOUT CONTRAST  Technique:  Contiguous axial images were obtained from the base of the skull through the vertex without contrast.  Comparison: 09/17/2011  Findings: No skull fracture is noted.  Paranasal sinuses and mastoid air cells are unremarkable.  Stable atrophy and chronic white matter disease.  Stable lacunar infarct in the right basal ganglia.  Small lacunar infarct noted in the left basal ganglia. No intracranial hemorrhage, mass effect or midline shift.  No definite acute cortical infarction.  IMPRESSION: No acute intracranial abnormality.  No definite acute cortical infarction.  Stable atrophy and chronic white matter disease.   Original Report Authenticated By: Natasha Mead, M.D.       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 No significant rapid heart rate.   1. Arm pain       MDM  Workup here tonight without any sniffing findings. CT of head shows no recent infarction. Patient doesn't have any new or worse symptoms in the last day so if there was any evidence or recurrent stroke it should be evident. The left shoulder actually has very good range of motion EKG has an no significant  abnormalities lab findings without the significant abnormalities no leukocytosis no anemia mild elevation in the creatinine. Patient would be advised to followup with her Dr. Hiram Comber next week.      I personally performed the services described in this documentation, which was scribed in my presence. The recorded information has been reviewed and considered.     [name], MD 08/17/12 [phone]

## 2012-08-17 NOTE — ED Notes (Signed)
Lt arm pain for 2 days, seems anxious and thinks she may have had another stroke.  Brother says speech has been worse over last few mos.  Says she can walk normally.

## 2012-08-17 NOTE — ED Notes (Signed)
asymptomatic

## 2012-08-17 NOTE — ED Notes (Signed)
No distress, essentially same as admission. Talking w/family at bedside.

## 2012-08-18 LAB — BASIC METABOLIC PANEL
CO2: 24 mEq/L (ref 19–32)
Chloride: 103 mEq/L (ref 96–112)
GFR calc Af Amer: 49 mL/min — ABNORMAL LOW (ref >90)
Sodium: 137 mEq/L (ref 135–145)

## 2012-12-17 ENCOUNTER — Encounter (HOSPITAL_COMMUNITY): Payer: Self-pay | Admitting: *Deleted

## 2012-12-17 ENCOUNTER — Emergency Department (HOSPITAL_COMMUNITY): Payer: Medicare Other

## 2012-12-17 ENCOUNTER — Emergency Department (HOSPITAL_COMMUNITY)
Admission: EM | Admit: 2012-12-17 | Discharge: 2012-12-18 | Disposition: A | Discharge status: Home / Self Care | Payer: Medicare Other | Attending: Emergency Medicine | Admitting: Emergency Medicine

## 2012-12-17 DIAGNOSIS — I10 Essential (primary) hypertension: Secondary | ICD-10-CM | POA: Insufficient documentation

## 2012-12-17 DIAGNOSIS — E7800 Pure hypercholesterolemia, unspecified: Secondary | ICD-10-CM | POA: Insufficient documentation

## 2012-12-17 DIAGNOSIS — Z79899 Other long term (current) drug therapy: Secondary | ICD-10-CM | POA: Insufficient documentation

## 2012-12-17 DIAGNOSIS — R079 Chest pain, unspecified: Secondary | ICD-10-CM | POA: Insufficient documentation

## 2012-12-17 DIAGNOSIS — Z8709 Personal history of other diseases of the respiratory system: Secondary | ICD-10-CM | POA: Insufficient documentation

## 2012-12-17 DIAGNOSIS — Z8739 Personal history of other diseases of the musculoskeletal system and connective tissue: Secondary | ICD-10-CM | POA: Insufficient documentation

## 2012-12-17 DIAGNOSIS — Z7982 Long term (current) use of aspirin: Secondary | ICD-10-CM | POA: Insufficient documentation

## 2012-12-17 DIAGNOSIS — Z8673 Personal history of transient ischemic attack (TIA), and cerebral infarction without residual deficits: Secondary | ICD-10-CM | POA: Insufficient documentation

## 2012-12-17 DIAGNOSIS — Z85118 Personal history of other malignant neoplasm of bronchus and lung: Secondary | ICD-10-CM | POA: Insufficient documentation

## 2012-12-17 LAB — CBC WITH DIFFERENTIAL/PLATELET
Basophils Absolute: 0 10*3/uL (ref 0.0–0.1)
Basophils Relative: 0 % (ref 0–1)
Eosinophils Absolute: 0.1 10*3/uL (ref 0.0–0.7)
Eosinophils Relative: 3 % (ref 0–5)
Lymphocytes Relative: 20 % (ref 12–46)
MCH: 32.6 pg (ref 26.0–34.0)
MCHC: 34.6 g/dL (ref 30.0–36.0)
MCV: 94.4 fL (ref 78.0–100.0)
Platelets: 133 10*3/uL — ABNORMAL LOW (ref 150–400)
RDW: 12.2 % (ref 11.5–15.5)
WBC: 5.2 10*3/uL (ref 4.0–10.5)

## 2012-12-17 LAB — COMPREHENSIVE METABOLIC PANEL
ALT: 28 U/L (ref 0–35)
AST: 88 U/L — ABNORMAL HIGH (ref 0–37)
Albumin: 3.5 g/dL (ref 3.5–5.2)
Calcium: 9.4 mg/dL (ref 8.4–10.5)
Sodium: 138 mEq/L (ref 135–145)
Total Protein: 6.8 g/dL (ref 6.0–8.3)

## 2012-12-17 MED ORDER — KETOROLAC TROMETHAMINE 60 MG/2ML IM SOLN
60.0000 mg | Freq: Once | INTRAMUSCULAR | Status: AC
  Administered 2012-12-17: 60 mg via INTRAMUSCULAR
  Filled 2012-12-17: qty 2

## 2012-12-17 NOTE — ED Provider Notes (Signed)
History    This chart was scribed for Geoffery Lyons, MD by Gerlean Ren, ED Scribe. This patient was seen in room APA11/APA11 and the patient's care was started at 11:00 PM    CSN: [id number]  Arrival date & time 12/17/12  2226   First MD Initiated Contact with Patient 12/17/12 2256      Chief Complaint  Patient presents with  . Shortness of Breath     The history is provided by the patient and a relative. No language interpreter was used.  Stephanie [name] is a 72 y.o. female with h/o CVA, HTN, lung CA who presents to the Emergency Department complaining of dyspnea with associated burning, non-radiating right lower chest pain that feels like heartburn both with sudden onset at about 10:00 PM tonight.  Chest pain still present but less severe than at onset.  Family member reports pt was performing "strenuous cleaning" earlier today, several hours before onset but had no complaints while cleaning.  Pt denies swelling or pain in legs.  Pt reports eating and not chewing the food enough as possible cause for heartburn.  Pt used Tums with no improvements to pain.  Pt had left upper lobectomy 11/18/2011 for lung CA.  No h/o cardiac complications.   Past Medical History  Diagnosis Date  . CVA (cerebral infarction)     speech difficulities  . Stroke   . Hypercholesteremia     Takes Zocor daily  . Hypertension     takes Atenolol and Lisinopril daily  . Lung mass     left upper lobe  . Arthritis     hands  . TIA (transient ischemic attack)     Sat before thanksgiving;impaired speech  . Pneumothorax   . Lung cancer     Past Surgical History  Procedure Laterality Date  . No past surgeries    . Lung biopsy  10/13/11    left  . Abdominal hysterectomy  1975  . Varicose vein surgery  46yrs ago  . Video assisted thoracoscopy  11/18/2011    Procedure: VIDEO ASSISTED THORACOSCOPY;  Surgeon: [name], MD;  Location: [facility] OR;  Service: Thoracic;  Laterality: Left;  . Lobectomy  11/18/2011     Procedure: LOBECTOMY;  Surgeon: [name], MD;  Location: [facility] OR;  Service: Thoracic;  Laterality: Left;  LEFT UPPER LOBECTOMY  . Breast biopsy  early 2000    Family History  Problem Relation Age of Onset  . Stroke Mother   . Stroke Sister   . Anesthesia problems Neg Hx   . Hypotension Neg Hx   . Malignant hyperthermia Neg Hx   . Pseudochol deficiency Neg Hx   . Diabetes Brother   . Cancer Brother   . Stroke Brother     History  Substance Use Topics  . Smoking status: Never Smoker   . Smokeless tobacco: Never Used  . Alcohol Use: No    No OB history provided.   Review of Systems  Respiratory: Positive for shortness of breath.   Cardiovascular: Positive for chest pain. Negative for leg swelling.  All other systems reviewed and are negative.    Allergies  Review of patient's allergies indicates no known allergies.  Home Medications   Current Outpatient Rx  Name  Route  Sig  Dispense  Refill  . aspirin EC 81 MG tablet   Oral   Take 81 mg by mouth every morning.         Marland Kitchen atenolol (TENORMIN) 50  MG tablet   Oral   Take 50 mg by mouth daily at 8 pm.         . lisinopril (PRINIVIL,ZESTRIL) 5 MG tablet   Oral   Take 5 mg by mouth every morning.         . simvastatin (ZOCOR) 20 MG tablet   Oral   Take 20 mg by mouth every evening.           BP 195/61  Pulse 74  Temp(Src) 97.8 F (36.6 C) (Oral)  Resp 22  Ht 5\' 7"  (1.702 m)  Wt 160 lb (72.576 kg)  BMI 25.05 kg/m2  SpO2 100%  Physical Exam  Nursing note and vitals reviewed. Constitutional: She is oriented to person, place, and time. She appears well-developed and well-nourished. No distress.  HENT:  Head: Normocephalic and atraumatic.  Eyes: EOM are normal.  Neck: Neck supple.  Cardiovascular: Normal rate, regular rhythm and normal heart sounds.   No murmur heard. Pulmonary/Chest: Effort normal and breath sounds normal. No respiratory distress. She has no wheezes.  Abdominal:  Soft. Bowel sounds are normal. She exhibits no distension. There is no tenderness.  Musculoskeletal: Normal range of motion. She exhibits no edema.  Homan's sign absent bilaterally.  Neurological: She is alert and oriented to person, place, and time.  Skin: Skin is warm and dry.  Psychiatric: She has a normal mood and affect. Her behavior is normal.    ED Course  Procedures (including critical care time) DIAGNOSTIC STUDIES: Oxygen Saturation is 100% on room air, normal by my interpretation.    COORDINATION OF CARE: 11:05 PM- Patient informed of clinical course, understands medical decision-making process, and agrees with plan.   Labs Reviewed - No data to display No results found.   No diagnosis found.   Date: 12/18/2012  Rate: 68  Rhythm: normal sinus rhythm  QRS Axis: normal  Intervals: normal  ST/T Wave abnormalities: normal  Conduction Disutrbances:none  Narrative Interpretation:   Old EKG Reviewed: unchanged    MDM  The patient presents with discomfort in the right lower aspect of the chest that started after cleaning house, then eating an orange.  The workup does not suggest a cardiac etiology and she has no prior heart history.  She has a history of lobectomy in the past but I doubt PE.  The oxygen levels are okay and there is no tachycardia.      I personally performed the services described in this documentation, which was scribed in my presence. The recorded information has been reviewed and is accurate.            Geoffery Lyons, MD 12/18/12 [phone]

## 2012-12-17 NOTE — ED Notes (Signed)
Sob x 1 hour after eating a tangerine, took rolaids without relief.  Chest pain, Rt ant lower chest wall.  No NVD

## 2012-12-18 LAB — POCT I-STAT TROPONIN I: Troponin i, poc: 0 ng/mL (ref 0.00–0.08)

## 2013-01-14 ENCOUNTER — Ambulatory Visit (HOSPITAL_COMMUNITY)
Admission: RE | Admit: 2013-01-14 | Discharge: 2013-01-14 | Disposition: A | Discharge status: Home / Self Care | Payer: Medicare Other | Source: Ambulatory Visit | Attending: Oncology | Admitting: Oncology

## 2013-01-14 DIAGNOSIS — Z85118 Personal history of other malignant neoplasm of bronchus and lung: Secondary | ICD-10-CM | POA: Insufficient documentation

## 2013-01-14 DIAGNOSIS — C801 Malignant (primary) neoplasm, unspecified: Secondary | ICD-10-CM

## 2013-01-14 DIAGNOSIS — R911 Solitary pulmonary nodule: Secondary | ICD-10-CM | POA: Insufficient documentation

## 2013-01-15 ENCOUNTER — Encounter (HOSPITAL_COMMUNITY): Payer: Medicare Other | Attending: Oncology | Admitting: Oncology

## 2013-01-15 ENCOUNTER — Encounter (HOSPITAL_COMMUNITY): Payer: Self-pay | Admitting: Oncology

## 2013-01-15 VITALS — BP 120/63 | HR 75 | Temp 97.4°F | Resp 18 | Wt 159.4 lb

## 2013-01-15 DIAGNOSIS — C801 Malignant (primary) neoplasm, unspecified: Secondary | ICD-10-CM

## 2013-01-15 DIAGNOSIS — E876 Hypokalemia: Secondary | ICD-10-CM

## 2013-01-15 DIAGNOSIS — J984 Other disorders of lung: Secondary | ICD-10-CM

## 2013-01-15 DIAGNOSIS — C3410 Malignant neoplasm of upper lobe, unspecified bronchus or lung: Secondary | ICD-10-CM

## 2013-01-15 MED ORDER — POTASSIUM CHLORIDE CRYS ER 10 MEQ PO TBCR: 20.0000 meq | EXTENDED_RELEASE_TABLET | Freq: Every day | ORAL | Status: DC

## 2013-01-15 NOTE — Progress Notes (Signed)
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 Dorris Fetch on 11/18/2011 thus far without recurrence but with small lesions in the right lung still present but less obvious. She still needs to be observed.  Problem #2 diffuse interstitial lung disease though she was never a smoker. She was around smoking most of her life however. This may or may not be related to that.  Problem #3 history of a stroke leaving her with difficulty speaking and some weakness still.   she is here today with her other daughter, Jamelle Rushing, and we will over the results of her CAT scan which still show small nodules that are less obvious and certainly not worse. Her lungs still show rales in both lung bases but no wheezing. These rales do not clear with coughing. Superiorly her lungs are clear her but with diminished breath sounds. Heart shows a regular rhythm and rate. Her dysarthria remains. She still has right arm and leg weakness. She has no leg edema or arm edema. She has no adenopathy in the cervical, supraclavicular, infraclavicular or axillary areas.  She was seen in the ED in February which revealed a mild hypokalemia. This was not addressed. I will give her K-Dur 10 milliequivalents daily for 30 days and check a potassium level in 6 weeks otherwise we will see her in 6 months after a repeat CAT scan of the chest.

## 2013-01-15 NOTE — Patient Instructions (Addendum)
[facility] Discharge Instructions  RECOMMENDATIONS MADE BY THE CONSULTANT AND ANY TEST RESULTS WILL BE SENT TO YOUR REFERRING PHYSICIAN.  EXAM FINDINGS BY THE PHYSICIAN TODAY AND SIGNS OR SYMPTOMS TO REPORT TO CLINIC OR PRIMARY PHYSICIAN: Exam and discussion by MD.  Your scans were stable.  Areas are smaller.  Your potassium level is a little low and we will put you on some potassium.  MEDICATIONS PRESCRIBED:  Potassium 10 meq take 1 daily - was e-scribed to your pharmacy.  INSTRUCTIONS GIVEN AND DISCUSSED: Report increased fatigue or shortness of breath, cough or blood in your sputum.  SPECIAL INSTRUCTIONS/FOLLOW-UP: Blood work at the end of April and CT of chest and follow-up appointment with the PA in 6 months.  Thank you for choosing [facility] to provide your oncology and hematology care.  To afford each patient quality time with our providers, please arrive at least 15 minutes before your scheduled appointment time.  With your help, our goal is to use those 15 minutes to complete the necessary work-up to ensure our physicians have the information they need to help with your evaluation and healthcare recommendations.    Effective January 1st, 2014, we ask that you re-schedule your appointment with our physicians should you arrive 10 or more minutes late for your appointment.  We strive to give you quality time with our providers, and arriving late affects you and other patients whose appointments are after yours.    Again, thank you for choosing [facility].  Our hope is that these requests will decrease the amount of time that you wait before being seen by our physicians.       _____________________________________________________________  Should you have questions after your visit to [facility], please contact our office at [phone] between the hours of 8:30 a.m. and 5:00 p.m.  Voicemails left after 4:30 p.m. will not  be returned until the following business day.  For prescription refill requests, have your pharmacy contact our office with your prescription refill request.

## 2013-01-16 ENCOUNTER — Ambulatory Visit (HOSPITAL_COMMUNITY): Payer: Medicare HMO | Admitting: Oncology

## 2013-01-29 ENCOUNTER — Telehealth: Payer: Self-pay | Admitting: Family Medicine

## 2013-01-29 NOTE — Telephone Encounter (Signed)
May well benefit from speech therapy I rec ov within 7 days then referal

## 2013-01-29 NOTE — Telephone Encounter (Signed)
Patient is having trouble with Speech.  Daughter is wanting to know if mom Audino) would benefit from Speech Therapy.  Selena Batten states Nemesis is not going to regular events or functions with friends to do embarrassment of her speech.  Call Kim either way please.

## 2013-01-29 NOTE — Telephone Encounter (Signed)
Speech therapy request

## 2013-01-30 ENCOUNTER — Encounter: Payer: Self-pay | Admitting: *Deleted

## 2013-02-01 NOTE — Telephone Encounter (Signed)
Appointment 02/04/2013

## 2013-02-04 ENCOUNTER — Encounter: Payer: Self-pay | Admitting: Family Medicine

## 2013-02-04 ENCOUNTER — Ambulatory Visit (INDEPENDENT_AMBULATORY_CARE_PROVIDER_SITE_OTHER): Payer: Medicare Other | Admitting: Family Medicine

## 2013-02-04 VITALS — BP 132/88 | HR 80 | Ht 67.0 in | Wt 159.8 lb

## 2013-02-04 DIAGNOSIS — I6350 Cerebral infarction due to unspecified occlusion or stenosis of unspecified cerebral artery: Secondary | ICD-10-CM

## 2013-02-04 DIAGNOSIS — I639 Cerebral infarction, unspecified: Secondary | ICD-10-CM

## 2013-02-04 DIAGNOSIS — I10 Essential (primary) hypertension: Secondary | ICD-10-CM

## 2013-02-04 DIAGNOSIS — I69322 Dysarthria following cerebral infarction: Secondary | ICD-10-CM | POA: Insufficient documentation

## 2013-02-04 DIAGNOSIS — I69922 Dysarthria following unspecified cerebrovascular disease: Secondary | ICD-10-CM

## 2013-02-04 DIAGNOSIS — I779 Disorder of arteries and arterioles, unspecified: Secondary | ICD-10-CM

## 2013-02-04 DIAGNOSIS — E785 Hyperlipidemia, unspecified: Secondary | ICD-10-CM | POA: Insufficient documentation

## 2013-02-04 NOTE — Progress Notes (Signed)
App made at [facility] radiology for Weds. April 9 at 1030. Left vm with pt and daughter [name] aware of app.

## 2013-02-04 NOTE — Patient Instructions (Signed)
Please do your lab work in the near future. It needs to be fasting. The blood is drawn at the lab on the second floor of the medical office building across from the emergency department.  Try Tylenol 500 mg one every 6 hours as needed for arthritis pain no greater than 4 per day. Try stopping diclofenac because of risk of gastric ulcers. Continue aspirin.  Speech therapy will be set up and we will call you with an appointment  Carotid ultrasound will be set up and they will let you know the appointment for that as well.  Followup office visit in 3 months.

## 2013-02-04 NOTE — Progress Notes (Deleted)
  Subjective:    Patient ID: Stephanie [name], female    DOB: [birth year], 72 y.o.   MRN: [id number]  HPI    Review of Systems     Objective:   Physical Exam        Assessment & Plan:

## 2013-02-04 NOTE — Progress Notes (Signed)
  Subjective:    Patient ID: Stephanie [name], female    DOB: [birth year], 72 y.o.   MRN: [id number]  HPIppatient is here today she's having a difficult time speaking she had a stroke couple years ago. She tries to follow a healthy diet she takes her aspirin daily she occasionally uses diclofenac for arthritis. She denies abdominal pain or rectal bleeding. She denies any swelling in the legs. She denies any unilateral numbness or weakness but she does relate a difficult time speaking in and does frustrate her a lot she denies being depressed no chest pressure shortness of breath she does have history hyperlipidemia history of stroke.  She lives by herself. Family helps her out. She also drives has not had any accidents no falls. Family history social history medical history reviewed.  Review of Systems See above.    Objective:   Physical Exam Neck there is slight bruit on the left side right side normal lungs clear no crackles heart is regular pulse normal blood pressure is good abdomen soft extremities no edema neurologic grossly normal patient does have difficult time dysarthria present       Assessment & Plan:  Dysarthria-speech therapy  I think would benefit this patient we will see her back in 3 months to see how things are going Stroke prevention continue aspirin Arthritis-I. Recommend Tylenol stay away from diclofenac Patient would benefit from carotid ultrasound due to bruit as well as a lipid profile. 25 minutes spent with family and patient discussing these issues.

## 2013-02-06 ENCOUNTER — Ambulatory Visit (HOSPITAL_COMMUNITY)
Admission: RE | Admit: 2013-02-06 | Discharge: 2013-02-06 | Disposition: A | Discharge status: Home / Self Care | Payer: Medicare Other | Source: Ambulatory Visit | Attending: Family Medicine | Admitting: Family Medicine

## 2013-02-06 ENCOUNTER — Encounter: Payer: Self-pay | Admitting: Family Medicine

## 2013-02-06 DIAGNOSIS — I6529 Occlusion and stenosis of unspecified carotid artery: Secondary | ICD-10-CM | POA: Insufficient documentation

## 2013-02-06 LAB — LIPID PANEL
Cholesterol: 185 mg/dL (ref 0–200)
HDL: 49 mg/dL (ref >39)
Total CHOL/HDL Ratio: 3.8 Ratio

## 2013-02-12 ENCOUNTER — Telehealth: Payer: Self-pay | Admitting: Family Medicine

## 2013-02-12 NOTE — Telephone Encounter (Signed)
Dx: stroke and difficulty speaking dx code given to Stephanie [name].

## 2013-02-12 NOTE — Telephone Encounter (Signed)
Stephanie [name] with [facility] [employer] eeds primary diagnostic code and reason why patient needs speech therapy.  Please call as soon as possible.  Stephanie [name] states you may leave a voicemail if she does not answer the phone.

## 2013-02-18 ENCOUNTER — Ambulatory Visit (HOSPITAL_COMMUNITY)
Admission: RE | Admit: 2013-02-18 | Discharge: 2013-02-18 | Disposition: A | Discharge status: Home / Self Care | Payer: Medicare Other | Source: Ambulatory Visit | Attending: Family Medicine | Admitting: Family Medicine

## 2013-02-18 DIAGNOSIS — M6281 Muscle weakness (generalized): Secondary | ICD-10-CM | POA: Insufficient documentation

## 2013-02-18 DIAGNOSIS — Z5189 Encounter for other specified aftercare: Secondary | ICD-10-CM | POA: Insufficient documentation

## 2013-02-18 DIAGNOSIS — R279 Unspecified lack of coordination: Secondary | ICD-10-CM | POA: Insufficient documentation

## 2013-02-18 DIAGNOSIS — I69998 Other sequelae following unspecified cerebrovascular disease: Secondary | ICD-10-CM | POA: Insufficient documentation

## 2013-02-18 DIAGNOSIS — I69922 Dysarthria following unspecified cerebrovascular disease: Secondary | ICD-10-CM | POA: Insufficient documentation

## 2013-02-19 ENCOUNTER — Ambulatory Visit (HOSPITAL_COMMUNITY)
Admission: RE | Admit: 2013-02-19 | Discharge: 2013-02-19 | Disposition: A | Discharge status: Home / Self Care | Payer: Medicare Other | Source: Ambulatory Visit | Attending: Family Medicine | Admitting: Family Medicine

## 2013-02-19 DIAGNOSIS — M6281 Muscle weakness (generalized): Secondary | ICD-10-CM | POA: Insufficient documentation

## 2013-02-19 DIAGNOSIS — R279 Unspecified lack of coordination: Secondary | ICD-10-CM | POA: Insufficient documentation

## 2013-02-19 NOTE — Evaluation (Signed)
Occupational Therapy Evaluation  Patient Details  Name: Stephanie [name] MRN: [id number] Date of Birth: 03/01/1941  Today's Date: 02/19/2013 Time: 1030-1100 OT Time Calculation (min): 30 min OT eval 1030-1100 30'  Visit#: 1 of 16  Re-eval: 03/19/13  Assessment Diagnosis: Left CVA Prior Therapy: None  Authorization: [employer] Medicare  Authorization Time Period: before 10th visit  Authorization Visit#: 1 of 10   Past Medical History:  Past Medical History  Diagnosis Date  . CVA (cerebral infarction)     speech difficulities  . Stroke   . Hypercholesteremia     Takes Zocor daily  . Hypertension     takes Atenolol and Lisinopril daily  . Lung mass     left upper lobe  . Arthritis     hands  . TIA (transient ischemic attack)     Sat before thanksgiving;impaired speech  . Pneumothorax   . Lung cancer    Past Surgical History:  Past Surgical History  Procedure Laterality Date  . No past surgeries    . Lung biopsy  10/13/11    left  . Abdominal hysterectomy  1975  . Varicose vein surgery  33yrs ago  . Video assisted thoracoscopy  11/18/2011    Procedure: VIDEO ASSISTED THORACOSCOPY;  Surgeon: [name], MD;  Location: [facility] OR;  Service: Thoracic;  Laterality: Left;  . Lobectomy  11/18/2011    Procedure: LOBECTOMY;  Surgeon: [name], MD;  Location: [facility] OR;  Service: Thoracic;  Laterality: Left;  LEFT UPPER LOBECTOMY  . Breast biopsy  early 2000    Subjective Symptoms/Limitations Symptoms: S: I want my right hand to get back to normal. Pertinent History: Stephanie [name] is a 72 y/o female s/p Left CVA with Rt side weakness and lack of coordination interferring with daily and leisure activities. Patient has not received any therapy after stroke. Dr. Gerda Diss has referred patient for occupational therapy evaluation and treatment. Limitations: opening jars, writing, meal prep, buttons, zippers, fixing hair Patient Stated Goals: To get her hand back to normal. Pain  Assessment Currently in Pain?: No/denies  Precautions/Restrictions  Precautions Precautions: None  Balance Screening Balance Screen Has the patient fallen in the past 6 months: No Has the patient had a decrease in activity level because of a fear of falling? : No Is the patient reluctant to leave their home because of a fear of falling? : No  Prior Functioning  Home Living Lives With: Son Type of Home: House Prior Function Vocation: Retired (previously worked at [employer] and [employer]) Leisure: Hobbies-yes (Comment) Comments: reading and puzzles  Assessment ADL/Vision/Perception ADL ADL Comments: Difficulty with buttons, unable to open jars, perform simple meal prep, zippers Dominant Hand: Right Vision - History Baseline Vision: No visual deficits Patient Visual Report: No change from baseline  Cognition/Observation Cognition Overall Cognitive Status: Within Functional Limits for tasks assessed Arousal/Alertness: Awake/alert Orientation Level: Oriented X4  Sensation/Coordination/Edema Coordination Gross Motor Movements are Fluid and Coordinated: No (impaired right) Fine Motor Movements are Fluid and Coordinated: No (impaired right) 9 Hole Peg Test: L: 53.3" R: 2'  Additional Assessments RUE Strength Right Shoulder Flexion: 3/5 Right Shoulder ABduction: 3/5 Right Shoulder Internal Rotation: 3/5 Right Shoulder External Rotation: 3/5 Grip (lbs): 25 Lateral Pinch: 8 lbs 3 Point Pinch: 8 lbs LUE Strength Left Shoulder Flexion: 4/5 Left Shoulder ABduction: 4/5 Left Shoulder Internal Rotation: 4/5 Left Shoulder External Rotation: 4/5 Grip (lbs): 45 Lateral Pinch: 9 lbs 3 Point Pinch: 11 lbs Right Hand Strength - Pinch (lbs)  Lateral Pinch: 8 lbs 3 Point Pinch: 8 lbs Left Hand Strength - Pinch (lbs) Lateral Pinch: 9 lbs 3 Point Pinch: 11 lbs    Occupational Therapy Assessment and Plan OT Assessment and Plan Clinical Impression Statement: A:  72  year old female with right upper extremity weakness and lack of coordination s/p cva. Her deficts including decreased left upper extremity ROM, strength, coordination are causing decreased functional  Indepedence with all daily activiites.  Pt will benefit from skilled therapeutic intervention in order to improve on the following deficits: Impaired UE functional use;Decreased coordination;Decreased strength Rehab Potential: Good OT Frequency: Min 2X/week OT Duration: 8 weeks OT Treatment/Interventions: Self-care/ADL training;Therapeutic activities;Therapeutic exercise;Neuromuscular education;Patient/family education;Manual therapy;Modalities OT Plan: P: Skilled OT intervention is indicated to improve  strength, FMC, and functional use of RUE needed to return to highest level of I with daily activiites and lesiure activiites. Treatment Plan:  ADL, FMC, strengthening, writing tasks, progress as tolerated.     Goals Short Term Goals Time to Complete Short Term Goals: 4 weeks Short Term Goal 1: Patient will be educated on HEP. Short Term Goal 2: Patient will increase right grip strength by 5 pounds and pinch strength by 3 pounds for increased ability to open containers. Short Term Goal 3: Patient will use her right hand as a gross assist with all daily activities. Short Term Goal 4: Patient will increase fine motor coordination to be able to perform buttons and zippers without difficulty. Short Term Goal 5: Patient will increase RUE strength to 4-/5 to be able to transition from sit to stand with less difficulty. Long Term Goals Time to Complete Long Term Goals: 8 weeks Long Term Goal 1: Patient will return to highest level of independence with daily and leisure activities. Long Term Goal 2: Patient will increase right grip strength by 8 pounds and pinch strength by 5 pounds for increased ability to write and sign name. Long Term Goal 3: Patient will use her right hand as a dominant assist with all  daily activities. Long Term Goal 4: Patient will increase fine motor coordination to be able to complete simple meal prep task. Long Term Goal 5: Patient will increase RUE strength to 4/5 to be able to transition from sit to stand with less difficulty.  Problem List Patient Active Problem List  Diagnosis  . CVA (cerebral infarction)  . HTN (hypertension)  . Pneumothorax of left lung after biopsy  . Adenocarcinoma  . Dysarthria as late effect of stroke  . Bilateral carotid artery disease  . Other and unspecified hyperlipidemia  . Lack of coordination  . Muscle weakness (generalized)    End of Session Activity Tolerance: Patient tolerated treatment well General Behavior During Therapy: WFL for tasks assessed/performed Cognition: WFL for tasks performed OT Plan of Care OT Home Exercise Plan: fine motor coordination and yellow theraputty OT Patient Instructions: handout Consulted and Agree with Plan of Care: Patient  GO Functional Assessment Tool Used: FAQ 43/60 = 71.6% independent 28.4% impaired Functional Limitation: Self care Self Care Current Status (Z6109): At least 20 percent but less than 40 percent impaired, limited or restricted Self Care Goal Status (U0454): 0 percent impaired, limited or restricted  Limmie Patricia, OTR/L,CBIS   02/19/2013, 4:11 PM  Physician Documentation Your signature is required to indicate approval of the treatment plan as stated above.  Please sign and either send electronically or make a copy of this report for your files and return this physician signed original.  Please mark one 1.__approve  of plan  2. ___approve of plan with the following conditions.   ______________________________                                                          _____________________ Physician Signature                                                                                                             Date

## 2013-02-19 NOTE — Evaluation (Signed)
Speech Language Pathology Evaluation Patient Details  Name: Stephanie [name] MRN: [id number] Date of Birth: 09-Mar-1941  Today's Date: 02/18/2013 Time: 1300-1345 SLP Time Calculation (min): 45 min  Authorization: BCBS Medicare  Authorization Time Period:    Authorization Visit#:   of     Past Medical History:  Past Medical History  Diagnosis Date  . CVA (cerebral infarction)     speech difficulities  . Stroke   . Hypercholesteremia     Takes Zocor daily  . Hypertension     takes Atenolol and Lisinopril daily  . Lung mass     left upper lobe  . Arthritis     hands  . TIA (transient ischemic attack)     Sat before thanksgiving;impaired speech  . Pneumothorax   . Lung cancer    Past Surgical History:  Past Surgical History  Procedure Laterality Date  . No past surgeries    . Lung biopsy  10/13/11    left  . Abdominal hysterectomy  1975  . Varicose vein surgery  23yrs ago  . Video assisted thoracoscopy  11/18/2011    Procedure: VIDEO ASSISTED THORACOSCOPY;  Surgeon: [name], MD;  Location: [facility] OR;  Service: Thoracic;  Laterality: Left;  . Lobectomy  11/18/2011    Procedure: LOBECTOMY;  Surgeon: [name], MD;  Location: [facility] OR;  Service: Thoracic;  Laterality: Left;  LEFT UPPER LOBECTOMY  . Breast biopsy  early 2000   HPI:   Stephanie [name] was referred by Dr. Lilyan Punt for speech therapy due to expressive aphasia and dysarthria from stroke in November 2012. Pt did not receive treatment due to Cancer treatment at that time. Her daughter reports that she is embarrassed by her speech and avoids going out to social events.    Prior Functional Status  Type of Home: House Lives With: Son Vocation: Retired (previously worked at [employer] and [employer])  Balance Screening Balance Screen Has the patient fallen in the past 6 months: No Has the patient had a decrease in activity level because of a fear of falling? : No Is the patient reluctant to  leave their home because of a fear of falling? : No  Cognition  Overall Cognitive Status: Within Functional Limits for tasks assessed Arousal/Alertness: Awake/alert Orientation Level: Oriented X4 Memory: Appears intact Awareness: Appears intact Problem Solving: Appears intact Safety/Judgment: Appears intact  Comprehension  Auditory Comprehension Overall Auditory Comprehension: Appears within functional limits for tasks assessed Yes/No Questions: Within Functional Limits Commands: Within Functional Limits Conversation: Complex Visual Recognition/Discrimination Discrimination: Within Function Limits Reading Comprehension Reading Status: Impaired Paragraph Level: Impaired Functional Environmental (signs, name badge): Within functional limits Interfering Components: Eye glasses not available Effective Techniques: Eye glasses  Expression  Expression Primary Mode of Expression: Verbal Verbal Expression Overall Verbal Expression: Impaired Initiation: No impairment Level of Generative/Spontaneous Verbalization: Sentence Repetition: Impaired Level of Impairment: Sentence level Naming: Impairment Responsive: 51-75% accurate Confrontation: Impaired Convergent: 50-74% accurate Divergent: 50-74% accurate Verbal Errors: Phonemic paraphasias;Aware of errors Pragmatics: No impairment Interfering Components: Speech intelligibility Effective Techniques: Phonemic cues Non-Verbal Means of Communication: Not applicable Written Expression Dominant Hand: Right Written Expression: Exceptions to [facility]  Oral/Motor  Oral Motor/Sensory Function Overall Oral Motor/Sensory Function: Appears within functional limits for tasks assessed Lingual Strength: Reduced Motor Speech Overall Motor Speech: Impaired Respiration: Within functional limits Phonation: Normal Resonance: Within functional limits Articulation: Impaired Level of Impairment: Sentence Intelligibility: Intelligibility  reduced Word: 75-100% accurate Phrase: 75-100% accurate Sentence: 75-100% accurate Conversation:  75-100% accurate Motor Planning: Witnin functional limits Motor Speech Errors: Aware Effective Techniques: Over-articulate  SLP Goals  Home Exercise SLP Goal: Patient will Perform Home Exercise Program: Independently SLP Short Term Goals SLP Short Term Goal 1: Pt will complete moderately complex responsive and confrontation naming tasks with 90% acc and mi/mod cues. SLP Short Term Goal 2: Pt will verbalize 10+ items in concrete categories with min cues. SLP Short Term Goal 3: Pt will complete paragraph level reading comprehension tasks with 90% acc and min cues. SLP Short Term Goal 4: Pt will verbalize 8+ word sentence in picture description task with mod assist for strategies. SLP Short Term Goal 5: Pt will complete moderately complex descriptive naming tasks with at least 4 attribues with min cues. SLP Long Term Goals SLP Long Term Goal 1: Increase verbal expression to mild impairment in home and community environment with use of strategies as needed.  Assessment/Plan  Patient Active Problem List  Diagnosis  . CVA (cerebral infarction)  . HTN (hypertension)  . Pneumothorax of left lung after biopsy  . Adenocarcinoma  . Dysarthria as late effect of stroke  . Bilateral carotid artery disease  . Other and unspecified hyperlipidemia  . Lack of coordination  . Muscle weakness (generalized)   SLP - End of Session Activity Tolerance: Patient tolerated treatment well General Behavior During Therapy: WFL for tasks assessed/performed Cognition: WFL for tasks performed  SLP Assessment/Plan Clinical Impression Statement: Stephanie [name] is a 72 yo female who sustained a stroke in November 2012 with resulting dysarthria and aphasia, however she never received therapy because she needed to address lung CA. 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 She is withdrawn from activities she typically enjoys because of her speech deficits. She presents with mild/moderate expressive aphasia with mild dysarthria characterized by decreased word finding, agrammtic speech, decreased intelligibility and decreased reading comprehension for paragraph length material. Speech therapy is recommended to address deficits and maximize communication skills in home and community environment. Speech Therapy Frequency: min 2x/week Duration: 4 weeks Treatment/Interventions: Compensatory techniques;SLP instruction and feedback;Cueing hierarchy;Compensatory strategies;Patient/family education;Multimodal communcation approach Potential to Achieve Goals: Good Potential Considerations: Other (comment) (time since onset of stroke)  GN Functional Assessment Tool Used: portions of [employer], [employer], informal measures Functional Limitations: Spoken language expressive Spoken Language Expression Current Status [phone]): At least 20 percent but less than 40 percent impaired, limited or restricted Spoken Language Expression Goal Status [phone]): At least 1 percent but less than 20 percent impaired, limited or restricted  Thank you,  Havery Moros, CCC-SLP [phone]  Stephanie [name] 02/18/2013, 8:25 PM  Physician Documentation Your signature is required to indicate approval of the treatment plan as stated above.  Please sign and either send electronically or make a copy of this report for your files and return this physician signed original.  Please mark one 1.__approve of plan  2. ___approve of plan with the following conditions.   ______________________________                                                          _____________________ Physician Signature  Date  

## 2013-02-19 NOTE — Progress Notes (Signed)
Speech Language Pathology Treatment Patient Details  Name: KHANIYA TENAGLIA MRN: [id number] Date of Birth: [birth year]  Today's Date: 02/19/2013 Time: 4098-1191 SLP Time Calculation (min): 45 min  Authorization: BCBS Medicare  Authorization Time Period:    Authorization Visit#:  2 of  8       Assessments Prior Functional Status Type of Home: House Lives With: Son Vocation: Retired (previously worked at [employer] and [employer]) Cognition Overall Cognitive Status: Within Functional Limits for tasks assessed Arousal/Alertness: Awake/alert Orientation Level: Oriented X4 Memory: Appears intact Awareness: Appears intact Problem Solving: Appears intact Safety/Judgment: Appears intact Auditory Comprehension Overall Auditory Comprehension: Appears within functional limits for tasks assessed Yes/No Questions: Within Functional Limits Commands: Within Functional Limits Conversation: Complex Visual Recognition/Discrimination Discrimination: Within Function Limits Reading Comprehension Reading Status: Impaired Paragraph Level: Impaired Functional Environmental (signs, name badge): Within functional limits Interfering Components: Eye glasses not available Effective Techniques: Eye glasses Expression Primary Mode of Expression: Verbal Verbal Expression Overall Verbal Expression: Impaired Initiation: No impairment Level of Generative/Spontaneous Verbalization: Sentence Repetition: Impaired Level of Impairment: Sentence level Naming: Impairment Responsive: 51-75% accurate Confrontation: Impaired Convergent: 50-74% accurate Divergent: 50-74% accurate Verbal Errors: Phonemic paraphasias;Aware of errors Pragmatics: No impairment Interfering Components: Speech intelligibility Effective Techniques: Phonemic cues Non-Verbal Means of Communication: Not applicable Written Expression Dominant Hand: Right Written Expression: Exceptions to [facility] Oral Motor/Sensory Function Overall  Oral Motor/Sensory Function: Appears within functional limits for tasks assessed Lingual Strength: Reduced Motor Speech Overall Motor Speech: Impaired Respiration: Within functional limits Phonation: Normal Resonance: Within functional limits Articulation: Impaired Level of Impairment: Sentence Intelligibility: Intelligibility reduced Word: 75-100% accurate Phrase: 75-100% accurate Sentence: 75-100% accurate Conversation: 75-100% accurate Motor Planning: Witnin functional limits Motor Speech Errors: Aware Effective Techniques: Over-articulate  Treatment  Aphasia Therapy Dysarthria Therapy Patient/Family Education Home Exercise Program  SLP Goals  Home Exercise SLP Goal: Patient will Perform Home Exercise Program: Independently SLP Goal: Perform Home Exercise Program - Progress: Progressing toward goal SLP Short Term Goals SLP Short Term Goal 1: Pt will complete moderately complex responsive and confrontation naming tasks with 90% acc and mi/mod cues. SLP Short Term Goal 1 - Progress: Progressing toward goal SLP Short Term Goal 2: Pt will verbalize 10+ items in concrete categories with min cues. SLP Short Term Goal 2 - Progress: Progressing toward goal SLP Short Term Goal 3: Pt will complete paragraph level reading comprehension tasks with 90% acc and min cues. SLP Short Term Goal 3 - Progress: Progressing toward goal SLP Short Term Goal 4: Pt will verbalize 8+ word sentence in picture description task with mod assist for strategies. SLP Short Term Goal 4 - Progress: Progressing toward goal SLP Short Term Goal 5: Pt will complete moderately complex descriptive naming tasks with at least 4 attribues with min cues. SLP Short Term Goal 5 - Progress: Progressing toward goal SLP Long Term Goals SLP Long Term Goal 1: Increase verbal expression to mild impairment in home and community environment with use of strategies as needed. SLP Long Term Goal 1 - Progress: Progressing toward  goal  Assessment/Plan  Patient Active Problem List  Diagnosis  . CVA (cerebral infarction)  . HTN (hypertension)  . Pneumothorax of left lung after biopsy  . Adenocarcinoma  . Dysarthria as late effect of stroke  . Bilateral carotid artery disease  . Other and unspecified hyperlipidemia  . Lack of coordination  . Muscle weakness (generalized)   SLP - End of Session Activity Tolerance: Patient tolerated treatment well General Behavior During Therapy:  WFL for tasks assessed/performed Cognition: WFL for tasks performed  SLP Assessment/Plan Clinical Impression Statement: Ms. Saar was able to describe her daily routine to me today with moderate probing questions from clinician. She reversed the order of when she goes to her brother's house and had  difficulty repairing her intent. She does get out of her house a little bit (eats at [employer] every morning and visits with her brother after that), but mostly stays home in the afternoons and watches soap operas. She expresses that she would like to "get out more" but feels she cannot because of her speech and her hand.  She completed naming to description tasks with 80% acc and benefitted from phonemic cues. Speech Therapy Frequency: min 2x/week Duration: 4 weeks Treatment/Interventions: Compensatory techniques;SLP instruction and feedback;Cueing hierarchy;Compensatory strategies;Patient/family education;Multimodal communcation approach Potential to Achieve Goals: Good Potential Considerations: Other (comment) (time since onset of stroke)  Mikiah Demond 02/19/2013, 8:42 PM

## 2013-02-25 ENCOUNTER — Other Ambulatory Visit: Payer: Self-pay | Admitting: Family Medicine

## 2013-02-25 ENCOUNTER — Other Ambulatory Visit: Payer: Self-pay | Admitting: *Deleted

## 2013-02-25 ENCOUNTER — Other Ambulatory Visit (HOSPITAL_COMMUNITY): Payer: Medicare Other

## 2013-02-25 MED ORDER — LOSARTAN POTASSIUM 50 MG PO TABS: 50.0000 mg | ORAL_TABLET | Freq: Every day | ORAL | Status: DC

## 2013-02-26 ENCOUNTER — Ambulatory Visit (HOSPITAL_COMMUNITY)
Admission: RE | Admit: 2013-02-26 | Discharge: 2013-02-26 | Disposition: A | Discharge status: Home / Self Care | Payer: Medicare Other | Source: Ambulatory Visit | Attending: Family Medicine | Admitting: Family Medicine

## 2013-02-26 NOTE — Progress Notes (Signed)
Occupational Therapy Treatment Patient Details  Name: Stephanie [name] MRN: [id number] Date of Birth: 21-Apr-1941  Today's Date: 02/26/2013 Time: 7829-5621 OT Time Calculation (min): 41 min NM re-ed 3086-5784 41'  Visit#: 2 of 16  Re-eval: 03/19/13    Authorization: BCBS Medicare  Authorization Time Period: before 10th visit  Authorization Visit#: 2 of 10  Subjective Symptoms/Limitations Symptoms: S: I've tried to do the exercises at home but they are hard. Pain Assessment Currently in Pain?: No/denies  Precautions/Restrictions  Precautions Precautions: None  Exercise/Treatments Hand Exercises Theraputty - Flatten: yellow (standing) Theraputty - Roll: attempted Theraputty - Pinch: yellow (with hand over hand assist to block fingers) Theraputty - Locate Pegs: 10 beads (min verbal cues to locate beads and use Rt hand only.) Sponges: 4,6 (verbal cues for technique) Small Pegboard: unable to complete Layfette pegboard; unable to see holes        Occupational Therapy Assessment and Plan OT Assessment and Plan Clinical Impression Statement: A: Pt had increased difficulty with motor coordination and planning during tx session. Pt able to perform all tasks asked with left hand and when asked to perform same tasks with Rt hand she had max difficulty. Main trouble seen during rolling of putty and pinch when patient required physical assist from therapist to use correct form. OT Plan: P: Cont to work on [facility], writing tasks and progress as tolerated.   Goals Short Term Goals Time to Complete Short Term Goals: 4 weeks Short Term Goal 1: Patient will be educated on HEP. Short Term Goal 1 Progress: Progressing toward goal Short Term Goal 2: Patient will increase right grip strength by 5 pounds and pinch strength by 3 pounds for increased ability to open containers. Short Term Goal 2 Progress: Progressing toward goal Short Term Goal 3: Patient will use her right hand as a gross assist with  all daily activities. Short Term Goal 3 Progress: Progressing toward goal Short Term Goal 4: Patient will increase fine motor coordination to be able to perform buttons and zippers without difficulty. Short Term Goal 4 Progress: Progressing toward goal Short Term Goal 5 Progress: Progressing toward goal Long Term Goals Time to Complete Long Term Goals: 8 weeks Long Term Goal 1: Patient will return to highest level of independence with daily and leisure activities. Long Term Goal 1 Progress: Progressing toward goal Long Term Goal 2: Patient will increase right grip strength by 8 pounds and pinch strength by 5 pounds for increased ability to write and sign name. Long Term Goal 2 Progress: Progressing toward goal Long Term Goal 3: Patient will use her right hand as a dominant assist with all daily activities. Long Term Goal 3 Progress: Progressing toward goal Long Term Goal 4: Patient will increase fine motor coordination to be able to complete simple meal prep task. Long Term Goal 4 Progress: Progressing toward goal Long Term Goal 5 Progress: Progressing toward goal  Problem List Patient Active Problem List   Diagnosis Date Noted  . Lack of coordination 02/19/2013  . Muscle weakness (generalized) 02/19/2013  . Dysarthria as late effect of stroke 02/04/2013  . Bilateral carotid artery disease 02/04/2013  . Other and unspecified hyperlipidemia 02/04/2013  . Adenocarcinoma 10/19/2011  . Pneumothorax of left lung after biopsy 10/14/2011    Class: Acute  . CVA (cerebral infarction) 09/17/2011  . HTN (hypertension) 09/17/2011    End of Session Activity Tolerance: Patient tolerated treatment well General Behavior During Therapy: [facility] for tasks assessed/performed Cognition: WFL for tasks performed  Limmie Patricia, OTR/L,CBIS   02/26/2013, 12:05 PM

## 2013-02-26 NOTE — Progress Notes (Signed)
Speech Language Pathology Treatment Patient Details  Name: Stephanie [name] MRN: [id number] Date of Birth: May 30, 1941  Today's Date: 02/26/2013 Time: 4098-1191 SLP Time Calculation (min): 45 min  Authorization: BCBS Medicare  Authorization Time Period:    Authorization Visit#:  3 of     HPI:  Symptoms/Limitations Symptoms: Doing well. Pain Assessment Currently in Pain?: No/denies    Treatment  Aphasia Therapy Patient/Family Education Home Exercise Program  SLP Goals  Home Exercise SLP Goal: Patient will Perform Home Exercise Program: Independently SLP Short Term Goals SLP Short Term Goal 1: Pt will complete moderately complex responsive and confrontation naming tasks with 90% acc and mi/mod cues. SLP Short Term Goal 1 - Progress: Progressing toward goal SLP Short Term Goal 2: Pt will verbalize 10+ items in concrete categories with min cues. SLP Short Term Goal 2 - Progress: Progressing toward goal SLP Short Term Goal 3: Pt will complete paragraph level reading comprehension tasks with 90% acc and min cues. SLP Short Term Goal 3 - Progress: Progressing toward goal SLP Short Term Goal 4: Pt will verbalize 8+ word sentence in picture description task with mod assist for strategies. SLP Short Term Goal 4 - Progress: Progressing toward goal SLP Short Term Goal 5: Pt will complete moderately complex descriptive naming tasks with at least 4 attribues with min cues. SLP Short Term Goal 5 - Progress: Progressing toward goal SLP Long Term Goals SLP Long Term Goal 1: Increase verbal expression to mild impairment in home and community environment with use of strategies as needed. SLP Long Term Goal 1 - Progress: Progressing toward goal  Assessment/Plan  Patient Active Problem List   Diagnosis Date Noted  . Lack of coordination 02/19/2013  . Muscle weakness (generalized) 02/19/2013  . Dysarthria as late effect of stroke 02/04/2013  . Bilateral carotid artery disease 02/04/2013  . Other  and unspecified hyperlipidemia 02/04/2013  . Adenocarcinoma 10/19/2011  . Pneumothorax of left lung after biopsy 10/14/2011    Class: Acute  . CVA (cerebral infarction) 09/17/2011  . HTN (hypertension) 09/17/2011   SLP - End of Session Activity Tolerance: Patient tolerated treatment well General Behavior During Therapy: WFL for tasks assessed/performed Cognition: WFL for tasks performed  SLP Assessment/Plan Clinical Impression Statement: Ms. Mahl expressed that the activity she enjoyed doing most was playing Bingo, however the smoking bothers her too much to continue to go. She attends church in [place], but does not know many people there and does not participate in activities there besides Sunday service. She completed category naming tasks with 90% acc and min assist. She has difficulty producing multisyllabic words with blends.  Speech Therapy Frequency: min 2x/week Duration: 4 weeks Treatment/Interventions: Compensatory techniques;SLP instruction and feedback;Cueing hierarchy;Compensatory strategies;Patient/family education;Multimodal communcation approach Potential to Achieve Goals: Good Potential Considerations: Other (comment) (time since onset of stroke)  Thank you,  Havery Moros, CCC-SLP [phone]     Deandra Gadson 02/26/2013, 12:39 PM

## 2013-02-28 ENCOUNTER — Ambulatory Visit (HOSPITAL_COMMUNITY)
Admission: RE | Admit: 2013-02-28 | Discharge: 2013-02-28 | Disposition: A | Discharge status: Home / Self Care | Payer: Medicare Other | Source: Ambulatory Visit | Attending: Family Medicine | Admitting: Family Medicine

## 2013-02-28 DIAGNOSIS — I69922 Dysarthria following unspecified cerebrovascular disease: Secondary | ICD-10-CM | POA: Insufficient documentation

## 2013-02-28 DIAGNOSIS — M6281 Muscle weakness (generalized): Secondary | ICD-10-CM | POA: Insufficient documentation

## 2013-02-28 DIAGNOSIS — R279 Unspecified lack of coordination: Secondary | ICD-10-CM | POA: Insufficient documentation

## 2013-02-28 DIAGNOSIS — I69998 Other sequelae following unspecified cerebrovascular disease: Secondary | ICD-10-CM | POA: Insufficient documentation

## 2013-02-28 DIAGNOSIS — Z5189 Encounter for other specified aftercare: Secondary | ICD-10-CM | POA: Insufficient documentation

## 2013-02-28 NOTE — Progress Notes (Signed)
Speech Language Pathology Treatment Patient Details  Name: Stephanie [name] MRN: [id number] Date of Birth: 1941-09-13  Today's Date: 02/28/2013 Time: 1030-1115 SLP Time Calculation (min): 45 min  Authorization: BCBS Medicare  Authorization Time Period:    Authorization Visit#:  4 of  8   HPI:  Symptoms/Limitations Symptoms: Doing well Pain Assessment Currently in Pain?: No/denies  Treatment  Aphasia Therapy Apraxia Therapy Patient/Family Education Home Exercise Program  SLP Goals  Home Exercise SLP Goal: Patient will Perform Home Exercise Program: Independently SLP Goal: Perform Home Exercise Program - Progress: Progressing toward goal SLP Short Term Goals SLP Short Term Goal 1: Pt will complete moderately complex responsive and confrontation naming tasks with 90% acc and mi/mod cues. SLP Short Term Goal 1 - Progress: Progressing toward goal SLP Short Term Goal 2: Pt will verbalize 10+ items in concrete categories with min cues. SLP Short Term Goal 2 - Progress: Progressing toward goal SLP Short Term Goal 3: Pt will complete paragraph level reading comprehension tasks with 90% acc and min cues. SLP Short Term Goal 3 - Progress: Progressing toward goal SLP Short Term Goal 4: Pt will verbalize 8+ word sentence in picture description task with mod assist for strategies. SLP Short Term Goal 4 - Progress: Progressing toward goal SLP Short Term Goal 5: Pt will complete moderately complex descriptive naming tasks with at least 4 attribues with min cues. SLP Short Term Goal 5 - Progress: Progressing toward goal SLP Long Term Goals SLP Long Term Goal 1: Increase verbal expression to mild impairment in home and community environment with use of strategies as needed. SLP Long Term Goal 1 - Progress: Progressing toward goal  Assessment/Plan  Patient Active Problem List   Diagnosis Date Noted  . Lack of coordination 02/19/2013  . Muscle weakness (generalized) 02/19/2013  . Dysarthria as  late effect of stroke 02/04/2013  . Bilateral carotid artery disease 02/04/2013  . Other and unspecified hyperlipidemia 02/04/2013  . Adenocarcinoma 10/19/2011  . Pneumothorax of left lung after biopsy 10/14/2011    Class: Acute  . CVA (cerebral infarction) 09/17/2011  . HTN (hypertension) 09/17/2011   SLP - End of Session Activity Tolerance: Patient tolerated treatment well General Behavior During Therapy: WFL for tasks assessed/performed Cognition: WFL for tasks performed  SLP Assessment/Plan Clinical Impression Statement: [name] complete HW with 83% acc (word exclusion). 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 She benefitted practicing multisyllabic words by saying them three times each after a model with reduced rate. Speech Therapy Frequency: min 2x/week Duration: 4 weeks Treatment/Interventions: Compensatory techniques;SLP instruction and feedback;Cueing hierarchy;Compensatory strategies;Patient/family education;Multimodal communcation approach Potential to Achieve Goals: Good Potential Considerations: Other (comment) (time since onset of stroke)     PORTER,DABNEY 02/28/2013, 12:37 PM

## 2013-03-05 ENCOUNTER — Inpatient Hospital Stay (HOSPITAL_COMMUNITY): Admission: RE | Admit: 2013-03-05 | Payer: Medicare Other | Source: Ambulatory Visit

## 2013-03-05 ENCOUNTER — Inpatient Hospital Stay (HOSPITAL_COMMUNITY): Admission: RE | Admit: 2013-03-05 | Payer: Medicare Other | Source: Ambulatory Visit | Admitting: Speech Pathology

## 2013-03-07 ENCOUNTER — Ambulatory Visit (HOSPITAL_COMMUNITY): Payer: Medicare Other | Admitting: Speech Pathology

## 2013-03-07 ENCOUNTER — Telehealth (HOSPITAL_COMMUNITY): Payer: Self-pay

## 2013-03-12 ENCOUNTER — Ambulatory Visit (HOSPITAL_COMMUNITY)
Admission: RE | Admit: 2013-03-12 | Discharge: 2013-03-12 | Disposition: A | Discharge status: Home / Self Care | Payer: Medicare Other | Source: Ambulatory Visit | Attending: Family Medicine | Admitting: Family Medicine

## 2013-03-12 NOTE — Progress Notes (Signed)
Speech Language Pathology Treatment Patient Details  Name: Stephanie [name] MRN: [id number] Date of Birth: Jun 08, 1941  Today's Date: 03/12/2013 Time: 1100-1145 SLP Time Calculation (min): 45 min  Authorization: [employer]BS [place] Medicare  Authorization Time Period: 02/18/2013-03/19/2013  Authorization Visit#:  5 of 8   HPI:  Symptoms/Limitations Symptoms: Doing well. She forgot about last week's appointment and went out of town. Pain Assessment Currently in Pain?: No/denies  Treatment  Aphasia Therapy Dysarthria Therapy Patient/Family Education Home Exercise Program  SLP Goals  Home Exercise SLP Goal: Patient will Perform Home Exercise Program: Independently SLP Short Term Goals SLP Short Term Goal 1: Pt will complete moderately complex responsive and confrontation naming tasks with 90% acc and mi/mod cues. SLP Short Term Goal 1 - Progress: Progressing toward goal SLP Short Term Goal 2: Pt will verbalize 10+ items in concrete categories with min cues. SLP Short Term Goal 3: Pt will complete paragraph level reading comprehension tasks with 90% acc and min cues. SLP Short Term Goal 3 - Progress: Progressing toward goal SLP Short Term Goal 4: Pt will verbalize 8+ word sentence in picture description task with mod assist for strategies. SLP Short Term Goal 4 - Progress: Progressing toward goal SLP Short Term Goal 5: Pt will complete moderately complex descriptive naming tasks with at least 4 attribues with min cues. SLP Short Term Goal 5 - Progress: Progressing toward goal SLP Long Term Goals SLP Long Term Goal 1: Increase verbal expression to mild impairment in home and community environment with use of strategies as needed. SLP Long Term Goal 1 - Progress: Progressing toward goal  Assessment/Plan  Patient Active Problem List   Diagnosis Date Noted  . Lack of coordination 02/19/2013  . Muscle weakness (generalized) 02/19/2013  . Dysarthria as late effect of stroke 02/04/2013  . Bilateral  carotid artery disease 02/04/2013  . Other and unspecified hyperlipidemia 02/04/2013  . Adenocarcinoma 10/19/2011  . Pneumothorax of left lung after biopsy 10/14/2011    Class: Acute  . CVA (cerebral infarction) 09/17/2011  . HTN (hypertension) 09/17/2011   SLP - End of Session Activity Tolerance: Patient tolerated treatment well General Behavior During Therapy: WFL for tasks assessed/performed Cognition: WFL for tasks performed  SLP Assessment/Plan Clinical Impression Statement: Stephanie [name] is requesting more visits because she feels that she is making progress. I have encouraged her to seek out social outing/events so that she is conversing with people outside of immediate family and her therapists. This is a struggle for her because she no longer enjoys Bingo nights due to the tobacco smoking present. Speech is slow in output/delivery, but effective for conversations. She has some difficulty with certain blends (pl, ld), however some of that may be preexisting per patient. She did a great job in apraxia blend tasks today. In word description tasks, she achieved 80% with min cues. Speech Therapy Frequency: min 2x/week Duration: 1 week Potential to Achieve Goals: Good  GN    Cortland Crehan 03/12/2013, 11:55 AM

## 2013-03-12 NOTE — Progress Notes (Signed)
Occupational Therapy Treatment Patient Details  Name: Stephanie [name] MRN: [id number] Date of Birth: [birth year]  Today's Date: 03/12/2013 Time: 1015-1100 OT Time Calculation (min): 45 min NM re-ed 1015-1100 45'   Visit#: 3 of 16  Re-eval: 03/19/13    Authorization: BCBS Medicare  Authorization Time Period: before 10th visit  Authorization Visit#: 3 of 10  Subjective Symptoms/Limitations Symptoms: S: I've been trying to complete those hand exercises. They are still hard for me to do.  Pain Assessment Currently in Pain?: No/denies  Precautions/Restrictions     Exercise/Treatments Hand Exercises Other Hand Exercises: Completed handwriting activity to increase fine motor coordination and hand strength. Completed worksheet of copying shapes with emphasis on fluidity.  Practiced writing name with adapted pencil grip. Pt had max difficulty with mimicing curved lines and staying within the boarders. Pt also experienced hand fatigue towards end of tx session as her legibility became worse. Pt was given writing HEP.        Occupational Therapy Assessment and Plan OT Assessment and Plan Clinical Impression Statement: A; Pt had increased difficulty with line curves and staying within the lines.  OT Plan: P: Cont to work on [facility], writing tasks and progress as tolerated.Follow up on writing HEP.   Goals Short Term Goals Time to Complete Short Term Goals: 4 weeks Short Term Goal 1: Patient will be educated on HEP. Short Term Goal 1 Progress: Progressing toward goal Short Term Goal 2: Patient will increase right grip strength by 5 pounds and pinch strength by 3 pounds for increased ability to open containers. Short Term Goal 2 Progress: Progressing toward goal Short Term Goal 3: Patient will use her right hand as a gross assist with all daily activities. Short Term Goal 3 Progress: Progressing toward goal Short Term Goal 4: Patient will increase fine motor coordination to be able to perform  buttons and zippers without difficulty. Short Term Goal 4 Progress: Progressing toward goal Short Term Goal 5 Progress: Progressing toward goal Long Term Goals Time to Complete Long Term Goals: 8 weeks Long Term Goal 1: Patient will return to highest level of independence with daily and leisure activities. Long Term Goal 1 Progress: Progressing toward goal Long Term Goal 2: Patient will increase right grip strength by 8 pounds and pinch strength by 5 pounds for increased ability to write and sign name. Long Term Goal 2 Progress: Progressing toward goal Long Term Goal 3: Patient will use her right hand as a dominant assist with all daily activities. Long Term Goal 3 Progress: Progressing toward goal Long Term Goal 4: Patient will increase fine motor coordination to be able to complete simple meal prep task. Long Term Goal 4 Progress: Progressing toward goal Long Term Goal 5 Progress: Progressing toward goal  Problem List Patient Active Problem List   Diagnosis Date Noted  . Lack of coordination 02/19/2013  . Muscle weakness (generalized) 02/19/2013  . Dysarthria as late effect of stroke 02/04/2013  . Bilateral carotid artery disease 02/04/2013  . Other and unspecified hyperlipidemia 02/04/2013  . Adenocarcinoma 10/19/2011  . Pneumothorax of left lung after biopsy 10/14/2011    Class: Acute  . CVA (cerebral infarction) 09/17/2011  . HTN (hypertension) 09/17/2011    End of Session Activity Tolerance: Patient tolerated treatment well General Behavior During Therapy: WFL for tasks assessed/performed Cognition: WFL for tasks performed OT Plan of Care OT Home Exercise Plan: Writing HEP OT Patient Instructions: worksheets Consulted and Agree with Plan of Care: Patient   Limmie Patricia, OTR/L,CBIS  03/12/2013, 12:06 PM

## 2013-03-14 ENCOUNTER — Ambulatory Visit (HOSPITAL_COMMUNITY)
Admission: RE | Admit: 2013-03-14 | Discharge: 2013-03-14 | Disposition: A | Discharge status: Home / Self Care | Payer: Medicare Other | Source: Ambulatory Visit | Attending: Family Medicine | Admitting: Family Medicine

## 2013-03-14 NOTE — Progress Notes (Signed)
Occupational Therapy Treatment Patient Details  Name: Stephanie [name] MRN: [id number] Date of Birth: 05-13-1941  Today's Date: 03/14/2013 Time: 1015-1100 OT Time Calculation (min): 45 min NM re-ed 1015-1100 45'  Visit#: 4 of 16  Re-eval: 03/19/13    Authorization: BCBS Medicare  Authorization Time Period: before 10th visit  Authorization Visit#: 4 of 10  Subjective Symptoms/Limitations Symptoms: S: My grandson has noticed that my speech is improving. Pain Assessment Currently in Pain?: No/denies  Precautions/Restrictions  Precautions Precautions: None  Exercise/Treatments Hand Exercises Other Hand Exercises: Reviewed new HEP for fine motor coordination. Finger Opposition combo 5X, Paper crumble, 10 clothespins pin pinch, Long finger tapping 10X, Coin flip, Card Slide, Card Flip        Occupational Therapy Assessment and Plan OT Assessment and Plan Clinical Impression Statement: A: Pt received new hand fine motor coordination HEP since she was having such difficulty with her previous one. Pt required hand over hand assist for Card Slid activity. Pt had max difficulty transitioning from pronation to supination. Pt did not have any difficulty with supination to pronation to flip cards over. Pt has max difficulty with in hand manipulation and being required to use thumb in coordination with fingers.  OT Plan: P: Add in hand manipulation task.  Follow up on new fine motor HEP.   Goals Short Term Goals Time to Complete Short Term Goals: 4 weeks Short Term Goal 1: Patient will be educated on HEP. Short Term Goal 2: Patient will increase right grip strength by 5 pounds and pinch strength by 3 pounds for increased ability to open containers. Short Term Goal 3: Patient will use her right hand as a gross assist with all daily activities. Short Term Goal 4: Patient will increase fine motor coordination to be able to perform buttons and zippers without difficulty. Long Term Goals Time to  Complete Long Term Goals: 8 weeks Long Term Goal 1: Patient will return to highest level of independence with daily and leisure activities. Long Term Goal 2: Patient will increase right grip strength by 8 pounds and pinch strength by 5 pounds for increased ability to write and sign name. Long Term Goal 3: Patient will use her right hand as a dominant assist with all daily activities. Long Term Goal 4: Patient will increase fine motor coordination to be able to complete simple meal prep task.  Problem List Patient Active Problem List   Diagnosis Date Noted  . Lack of coordination 02/19/2013  . Muscle weakness (generalized) 02/19/2013  . Dysarthria as late effect of stroke 02/04/2013  . Bilateral carotid artery disease 02/04/2013  . Other and unspecified hyperlipidemia 02/04/2013  . Adenocarcinoma 10/19/2011  . Pneumothorax of left lung after biopsy 10/14/2011    Class: Acute  . CVA (cerebral infarction) 09/17/2011  . HTN (hypertension) 09/17/2011    End of Session Activity Tolerance: Patient tolerated treatment well General Behavior During Therapy: WFL for tasks assessed/performed Cognition: WFL for tasks performed OT Plan of Care OT Home Exercise Plan: Fine Motor Corrdination HEP OT Patient Instructions: worksheets Consulted and Agree with Plan of Care: Patient   Limmie Patricia, OTR/L,CBIS   03/14/2013, 12:13 PM

## 2013-03-15 NOTE — Progress Notes (Signed)
Speech Language Pathology Treatment Patient Details  Name: Stephanie [name] MRN: [id number] Date of Birth: 1940/11/14  Today's Date: 03/14/2013 Time: 1101-1146 SLP Time Calculation (min): 45 min  Authorization: BCBS [place] Medicare  Authorization Time Period: 02/18/2013-03/19/2013  Authorization Visit#:  6 of 8      Treatment  Aphasia Therapy Dysarthria Therapy Patient/Family Education Home Exercise Program  SLP Goals  Home Exercise SLP Goal: Patient will Perform Home Exercise Program: Independently SLP Goal: Perform Home Exercise Program - Progress: Progressing toward goal SLP Short Term Goals SLP Short Term Goal 1: Pt will complete moderately complex responsive and confrontation naming tasks with 90% acc and mi/mod cues. SLP Short Term Goal 1 - Progress: Progressing toward goal SLP Short Term Goal 2: Pt will verbalize 10+ items in concrete categories with min cues. SLP Short Term Goal 2 - Progress: Progressing toward goal SLP Short Term Goal 3: Pt will complete paragraph level reading comprehension tasks with 90% acc and min cues. SLP Short Term Goal 3 - Progress: Progressing toward goal SLP Short Term Goal 4: Pt will verbalize 8+ word sentence in picture description task with mod assist for strategies. SLP Short Term Goal 4 - Progress: Progressing toward goal SLP Short Term Goal 5: Pt will complete moderately complex descriptive naming tasks with at least 4 attribues with min cues. SLP Short Term Goal 5 - Progress: Progressing toward goal SLP Long Term Goals SLP Long Term Goal 1: Increase verbal expression to mild impairment in home and community environment with use of strategies as needed. SLP Long Term Goal 1 - Progress: Progressing toward goal  Assessment/Plan  Patient Active Problem List   Diagnosis Date Noted  . Lack of coordination 02/19/2013  . Muscle weakness (generalized) 02/19/2013  . Dysarthria as late effect of stroke 02/04/2013  . Bilateral carotid artery disease  02/04/2013  . Other and unspecified hyperlipidemia 02/04/2013  . Adenocarcinoma 10/19/2011  . Pneumothorax of left lung after biopsy 10/14/2011    Class: Acute  . CVA (cerebral infarction) 09/17/2011  . HTN (hypertension) 09/17/2011   SLP - End of Session Activity Tolerance: Patient tolerated treatment well General Behavior During Therapy: WFL for tasks assessed/performed Cognition: WFL for tasks performed  SLP Assessment/Plan Clinical Impression Statement: Stephanie [name] did a great job during picture description tasks once she was encouraged to try to use compete sentences (initially just lists nouns/objects that she sees). Wh-questions were used as probes to elicit elaboration of responses. During open-ended hypothetical Wh-questions, she needed an example for most questions before she generated a novel response. She completed paragraph level reading comprehension homework with 100% acc, but reported that it took her a while to complete. Speech Therapy Frequency: min 2x/week Duration: 1 week Potential to Achieve Goals: Good  GN    PORTER,DABNEY 03/14/2013, 1:35 PM

## 2013-03-21 ENCOUNTER — Encounter: Payer: Self-pay | Admitting: Family Medicine

## 2013-03-21 ENCOUNTER — Ambulatory Visit (HOSPITAL_COMMUNITY)
Admission: RE | Admit: 2013-03-21 | Discharge: 2013-03-21 | Disposition: A | Discharge status: Home / Self Care | Payer: Medicare Other | Source: Ambulatory Visit | Attending: Family Medicine | Admitting: Family Medicine

## 2013-03-21 ENCOUNTER — Ambulatory Visit (INDEPENDENT_AMBULATORY_CARE_PROVIDER_SITE_OTHER): Payer: Medicare Other | Admitting: Family Medicine

## 2013-03-21 VITALS — BP 122/72 | HR 80 | Wt 155.0 lb

## 2013-03-21 DIAGNOSIS — I10 Essential (primary) hypertension: Secondary | ICD-10-CM

## 2013-03-21 DIAGNOSIS — I6350 Cerebral infarction due to unspecified occlusion or stenosis of unspecified cerebral artery: Secondary | ICD-10-CM

## 2013-03-21 DIAGNOSIS — R279 Unspecified lack of coordination: Secondary | ICD-10-CM

## 2013-03-21 DIAGNOSIS — I69322 Dysarthria following cerebral infarction: Secondary | ICD-10-CM

## 2013-03-21 DIAGNOSIS — I639 Cerebral infarction, unspecified: Secondary | ICD-10-CM

## 2013-03-21 DIAGNOSIS — E785 Hyperlipidemia, unspecified: Secondary | ICD-10-CM

## 2013-03-21 DIAGNOSIS — I69922 Dysarthria following unspecified cerebrovascular disease: Secondary | ICD-10-CM

## 2013-03-21 DIAGNOSIS — I779 Disorder of arteries and arterioles, unspecified: Secondary | ICD-10-CM

## 2013-03-21 MED ORDER — ATENOLOL 50 MG PO TABS: 50.0000 mg | ORAL_TABLET | ORAL | Status: DC

## 2013-03-21 MED ORDER — SIMVASTATIN 40 MG PO TABS: ORAL_TABLET | ORAL | Status: DC

## 2013-03-21 MED ORDER — LISINOPRIL 5 MG PO TABS: 5.0000 mg | ORAL_TABLET | Freq: Every day | ORAL | Status: DC

## 2013-03-21 NOTE — Progress Notes (Signed)
  Subjective:    Patient ID: Stephanie [name], female    DOB: 10-Jul-1941, 72 y.o.   MRN: [id number]  HPI Patient in for a blood pressure check up. Pt taking losartan but wants to go back on lisinopril because losartan taste bad. Pt states she wants her lungs checked because she feels like she has fluid in her lungs x 3 days. Patient would also like a mammogram set up for her. Patient states that she did not feel she had an allergic reaction lisinopril she thinks it just made her feel ill back then but she would like to try it again. She states that her speech is gradually getting better. She still has weakness. She has family that helps her out. She has not had any new symptoms to make her think that she's had another stroke. She also states she can't eat healthy diet and takes her medication. She had recent lab work. An ultrasound about a month ago. Results of this was reviewed with patient in detail. History of stroke hypertension hyperlipidemia Family history noncontributory Social does not smoke  Review of Systems See above    Objective:   Physical Exam Blood pressure recheck 128/72 lungs are clear no crackle heart is regular neck no masses extremities no edema does have some weakness in the arm related to her stroke also has dysarthria in speaking       Assessment & Plan:  #1 stroke-gradually getting better but she will have long-term deficit #2 hypertension on recheck it's very good no need for further adjustments but she would like a different medicine so therefore stopped losartan. Start lisinopril 5 mg daily #3 hyperlipidemia not quite at goal increase simvastatin to 40 mg daily check lipid liver profile. #4-dysarthria she will continue speech therapy. #5 moderately abnormal ultrasound repeat this again in 9-12 months

## 2013-03-22 ENCOUNTER — Other Ambulatory Visit: Payer: Self-pay | Admitting: *Deleted

## 2013-03-22 DIAGNOSIS — Z1211 Encounter for screening for malignant neoplasm of colon: Secondary | ICD-10-CM

## 2013-03-22 NOTE — Progress Notes (Signed)
Screening mammogram [facility] 03-26-13 11am pt notified of appointment.

## 2013-03-26 ENCOUNTER — Telehealth: Payer: Self-pay | Admitting: Family Medicine

## 2013-03-26 ENCOUNTER — Ambulatory Visit (HOSPITAL_COMMUNITY)
Admission: RE | Admit: 2013-03-26 | Discharge: 2013-03-26 | Disposition: A | Discharge status: Home / Self Care | Payer: Medicare Other | Source: Ambulatory Visit | Attending: Family Medicine | Admitting: Family Medicine

## 2013-03-26 ENCOUNTER — Ambulatory Visit (HOSPITAL_COMMUNITY): Payer: Medicare Other | Admitting: Speech Pathology

## 2013-03-26 DIAGNOSIS — Z1211 Encounter for screening for malignant neoplasm of colon: Secondary | ICD-10-CM

## 2013-03-26 DIAGNOSIS — Z1231 Encounter for screening mammogram for malignant neoplasm of breast: Secondary | ICD-10-CM | POA: Insufficient documentation

## 2013-03-26 NOTE — Telephone Encounter (Signed)
Message: [name] @ [phone]  Would like for the results from [facility] Digital Screening.  Per Darleen Crocker cannot check her voice mail and/or tell if she miss a call.  The screening was done 03/26/13.

## 2013-03-28 ENCOUNTER — Ambulatory Visit (HOSPITAL_COMMUNITY)
Admission: RE | Admit: 2013-03-28 | Discharge: 2013-03-28 | Disposition: A | Discharge status: Home / Self Care | Payer: Medicare Other | Source: Ambulatory Visit | Attending: Family Medicine | Admitting: Family Medicine

## 2013-03-28 DIAGNOSIS — M6281 Muscle weakness (generalized): Secondary | ICD-10-CM

## 2013-03-28 DIAGNOSIS — R279 Unspecified lack of coordination: Secondary | ICD-10-CM

## 2013-03-28 NOTE — Progress Notes (Signed)
Speech Language Pathology Treatment Patient Details  Name: Stephanie [name] MRN: [id number] Date of Birth: Dec 06, 1940  Today's Date: 03/28/2013 Time: 4098-1191 SLP Time Calculation (min): 45 min  Authorization: BCBS [place] Medicare  Authorization Time Period: 03/21/2013-04/29/2013  Authorization Visit#:  1 of 11   HPI:  Symptoms/Limitations Symptoms: Doing well. Pain Assessment Currently in Pain?: No/denies   Treatment  Aphasia Therapy Dysarthria Therapy Patient/Family Education Home Exercise Program  SLP Goals  Home Exercise SLP Goal: Patient will Perform Home Exercise Program: Independently SLP Short Term Goals SLP Short Term Goal 1: Pt will complete moderately complex responsive and confrontation naming tasks with 90% acc and mi/mod cues. SLP Short Term Goal 1 - Progress: Progressing toward goal SLP Short Term Goal 2: Pt will verbalize 10+ items in concrete categories with min cues. SLP Short Term Goal 2 - Progress: Progressing toward goal SLP Short Term Goal 3: Pt will complete paragraph level reading comprehension tasks with 90% acc and min cues. SLP Short Term Goal 3 - Progress: Progressing toward goal SLP Short Term Goal 4: Pt will verbalize 8+ word sentence in picture description task with mod assist for strategies. SLP Short Term Goal 4 - Progress: Progressing toward goal SLP Short Term Goal 5: Pt will complete moderately complex descriptive naming tasks with at least 4 attribues with min cues. SLP Short Term Goal 5 - Progress: Progressing toward goal SLP Long Term Goals SLP Long Term Goal 1: Increase verbal expression to mild impairment in home and community environment with use of strategies as needed. SLP Long Term Goal 1 - Progress: Progressing toward goal  Assessment/Plan  Patient Active Problem List   Diagnosis Date Noted  . Lack of coordination 02/19/2013  . Muscle weakness (generalized) 02/19/2013  . Dysarthria as late effect of stroke 02/04/2013  . Bilateral  carotid artery disease 02/04/2013  . Other and unspecified hyperlipidemia 02/04/2013  . Adenocarcinoma 10/19/2011  . Pneumothorax of left lung after biopsy 10/14/2011    Class: Acute  . CVA (cerebral infarction) 09/17/2011  . HTN (hypertension) 09/17/2011   SLP - End of Session Activity Tolerance: Patient tolerated treatment well General Behavior During Therapy: WFL for tasks assessed/performed Cognition: WFL for tasks performed  SLP Assessment/Plan Clinical Impression Statement: Stephanie [name] said she did not complete HW because she didn't know if she would be coming back Stephanie [name]), however I explained that working on exercises at home will benefit her. She was able to create 6+ word sentences when describing pictures with 100% acc and ~80% intelligibility. Intelligiblity improved with repetition. She had a slightly more difficult time repeating sentences that were clinician generated, but intelligibility was improved. She was able to identify wrong item in category (Level 2) with 75% acc and complete basic level analogies with 90% acc. Speech Therapy Frequency: min 2x/week Duration: 4 weeks Potential to Achieve Goals: Good  GN    Stephanie [name] 03/28/2013, 11:13 AM

## 2013-03-28 NOTE — Progress Notes (Signed)
Occupational Therapy Treatment Patient Details  Name: Stephanie [name] MRN: [id number] Date of Birth: July 09, 1941  Today's Date: 03/28/2013 Time: 8295-6213 OT Time Calculation (min): 50 min Neuro reeducation 30' Reassessment 20' Visit#: 5 of 16  Re-eval: 04/11/13    Authorization: BCBS Medicare  Authorization Time Period: before 15th visit  Authorization Visit#: 5 of 15  Subjective S:  I really havent been doing my HEP.  I left the paper in the car and the green putty is too hard. Pain Assessment Currently in Pain?: No/denies  Precautions/Restrictions   n/a  Exercise/Treatments Hand Exercises Theraputty - Flatten: pink Theraputty - Roll: pink Theraputty - Locate Pegs: pink - located 5 pennies Other Hand Exercises: Handwriting - wrote name, address, and phone number which were illegible.  I printed her address out, and asked her to copy it printing, with        Occupational Therapy Assessment and Plan OT Assessment and Plan Clinical Impression Statement: A: reassessment compelted this date: right shoulder strength 5/5 (3/5), grip strength 27# (25#), lateral pinch strength 9# (8#), 3-point pinch strength 8# (8#), FMC assessed with Nine Hole Peg Test right 2'14" (2'). Patient is not complying with HEP, and has sporadic attendance.  I reviewed the importance of HEP compliance for increased use of RUE.   OT Frequency: Min 2X/week OT Duration: 2 weeks OT Plan: P:  Continue skilled OT intervention 2 times per week x 2 weeks with commitment from patient for increased compliance with HEP.  If no further improvement in 2 weeks, will have to dc from therapy.  Attempt light cooking task and handwriting activities.    Goals Short Term Goals Time to Complete Short Term Goals: 4 weeks Short Term Goal 1: Patient will be educated on HEP. Short Term Goal 1 Progress: Progressing toward goal Short Term Goal 2: Patient will increase right grip strength by 5 pounds and pinch strength by 3 pounds for  increased ability to open containers. Short Term Goal 2 Progress: Progressing toward goal Short Term Goal 3: Patient will use her right hand as a gross assist with all daily activities. Short Term Goal 3 Progress: Met Short Term Goal 4: Patient will increase fine motor coordination to be able to perform buttons and zippers without difficulty. Short Term Goal 4 Progress: Met Short Term Goal 5: Patient will increase RUE strength to 4-/5 to be able to transition from sit to stand with less difficulty. Short Term Goal 5 Progress: Met Long Term Goals Time to Complete Long Term Goals: 8 weeks Long Term Goal 1: Patient will return to highest level of independence with daily and leisure activities. Long Term Goal 1 Progress: Progressing toward goal Long Term Goal 2: Patient will increase right grip strength by 8 pounds and pinch strength by 5 pounds for increased ability to write and sign name. Long Term Goal 2 Progress: Progressing toward goal Long Term Goal 3: Patient will use her right hand as a dominant assist with all daily activities. Long Term Goal 3 Progress: Progressing toward goal Long Term Goal 4: Patient will increase fine motor coordination to be able to complete simple meal prep task. Long Term Goal 4 Progress: Progressing toward goal Long Term Goal 5: Patient will increase RUE strength to 4/5 to be able to transition from sit to stand with less difficulty. Long Term Goal 5 Progress: Met  Problem List Patient Active Problem List   Diagnosis Date Noted  . Lack of coordination 02/19/2013  . Muscle weakness (generalized) 02/19/2013  .  Dysarthria as late effect of stroke 02/04/2013  . Bilateral carotid artery disease 02/04/2013  . Other and unspecified hyperlipidemia 02/04/2013  . Adenocarcinoma 10/19/2011  . Pneumothorax of left lung after biopsy 10/14/2011    Class: Acute  . CVA (cerebral infarction) 09/17/2011  . HTN (hypertension) 09/17/2011    End of Session Activity  Tolerance: Patient tolerated treatment well General Behavior During Therapy: WFL for tasks assessed/performed Cognition: WFL for tasks performed  GO Functional Assessment Tool Used: FAQ was 71.6% independent, and is currently 79% independent level and 21% impaired.  Functional Limitation: Self care Self Care Current Status [phone]): At least 20 percent but less than 40 percent impaired, limited or restricted Self Care Goal Status (U0454): 0 percent impaired, limited or restricted  Shirlean Mylar, OTR/L  03/28/2013, 9:32 AM

## 2013-04-02 ENCOUNTER — Ambulatory Visit (HOSPITAL_COMMUNITY)
Admission: RE | Admit: 2013-04-02 | Discharge: 2013-04-02 | Disposition: A | Discharge status: Home / Self Care | Payer: Medicare Other | Source: Ambulatory Visit | Attending: Family Medicine | Admitting: Family Medicine

## 2013-04-02 DIAGNOSIS — I69993 Ataxia following unspecified cerebrovascular disease: Secondary | ICD-10-CM | POA: Insufficient documentation

## 2013-04-02 DIAGNOSIS — M6281 Muscle weakness (generalized): Secondary | ICD-10-CM | POA: Insufficient documentation

## 2013-04-02 DIAGNOSIS — Z5189 Encounter for other specified aftercare: Secondary | ICD-10-CM | POA: Insufficient documentation

## 2013-04-02 DIAGNOSIS — I69922 Dysarthria following unspecified cerebrovascular disease: Secondary | ICD-10-CM | POA: Insufficient documentation

## 2013-04-02 DIAGNOSIS — I10 Essential (primary) hypertension: Secondary | ICD-10-CM | POA: Insufficient documentation

## 2013-04-02 NOTE — Progress Notes (Signed)
Speech Language Pathology Treatment Patient Details  Name: Stephanie [name] MRN: [id number] Date of Birth: [birth year]  Today's Date: 04/02/2013 Time: 1111-1200 SLP Time Calculation (min): 49 min  Authorization: [employer] [place] [employer]  Authorization Time Period: 03/21/2013-04/29/2013  Authorization Visit#:  2 of 11   HPI:  Symptoms/Limitations Symptoms: Doing well Pain Assessment Currently in Pain?: No/denies   Treatment  Aphasia Therapy Dysarthria Therapy Patient/Family Education Home Exercise Program  SLP Goals  Home Exercise SLP Goal: Patient will Perform Home Exercise Program: Independently SLP Goal: Perform Home Exercise Program - Progress: Progressing toward goal SLP Short Term Goals SLP Short Term Goal 1: Pt will complete moderately complex responsive and confrontation naming tasks with 90% acc and mi/mod cues. SLP Short Term Goal 1 - Progress: Progressing toward goal SLP Short Term Goal 2: Pt will verbalize 10+ items in concrete categories with min cues. SLP Short Term Goal 2 - Progress: Progressing toward goal SLP Short Term Goal 3: Pt will complete paragraph level reading comprehension tasks with 90% acc and min cues. SLP Short Term Goal 3 - Progress: Progressing toward goal SLP Short Term Goal 4: Pt will verbalize 8+ word sentence in picture description task with mod assist for strategies. SLP Short Term Goal 4 - Progress: Progressing toward goal SLP Short Term Goal 5: Pt will complete moderately complex descriptive naming tasks with at least 4 attribues with min cues. SLP Short Term Goal 5 - Progress: Progressing toward goal SLP Long Term Goals SLP Long Term Goal 1: Increase verbal expression to mild impairment in home and community environment with use of strategies as needed. SLP Long Term Goal 1 - Progress: Progressing toward goal  Assessment/Plan  Patient Active Problem List   Diagnosis Date Noted  . Lack of coordination 02/19/2013  . Muscle weakness (generalized)  02/19/2013  . Dysarthria as late effect of stroke 02/04/2013  . Bilateral carotid artery disease 02/04/2013  . Other and unspecified hyperlipidemia 02/04/2013  . Adenocarcinoma 10/19/2011  . Pneumothorax of left lung after biopsy 10/14/2011    Class: Acute  . CVA (cerebral infarction) 09/17/2011  . HTN (hypertension) 09/17/2011   SLP - End of Session Activity Tolerance: Patient tolerated treatment well General Behavior During Therapy: WFL for tasks assessed/performed Cognition: WFL for tasks performed  SLP Assessment/Plan Clinical Impression Statement: Stephanie [name] was able to select letter specific word to named category with 80% and moderate cues. During oral reading of multisyllabic words, she did a great job with 3-syllable words (90%acc). She had more difficulty with 4-5 syllable words and generally elminated a syllable or two to make it three syllables. She was encouraged to over articulate and pause between syllables when too complex.  Speech Therapy Frequency: min 2x/week Duration: 4 weeks Potential to Achieve Goals: Good  GN    Stephanie [name] 04/02/2013, 5:30 PM

## 2013-04-02 NOTE — Progress Notes (Signed)
Occupational Therapy Treatment Patient Details  Name: KERA DEACON MRN: [id number] Date of Birth: 1941/03/23  Today's Date: 04/02/2013 Time: 1020-1101 OT Time Calculation (min): 41 min NM re-ed 1020-1101 41'  Visit#: 6 of 16  Re-eval: 04/25/13    Authorization: BCBS Medicare  Authorization Time Period: before 15th visit  Authorization Visit#: 6 of 15  Subjective Symptoms/Limitations Symptoms: S: I dd the clothespins and hand exercises. I didn't do the cards.  Pain Assessment Currently in Pain?: No/denies  Precautions/Restrictions  Precautions Precautions: None  Exercise/Treatments Hand Exercises Other Hand Exercises: Handwriting activity to increase fine motor coordination and hand strength. Pt copied 2 advertisement headlines from magazine with adapted pencil grip. Legibility was decreased. Letters were in print (random upper and small case used). Pt completed tracing worksheet of curved lines with emphasis on writing fluidity. Handwriting worksheets given for HEP.  Other Hand Exercises: Clothespins of various resistance placed on edge of container and removed with Rt hand to increase pinch strength. vc's required to use Rt. hand only and for encouragement.        Occupational Therapy Assessment and Plan OT Assessment and Plan Clinical Impression Statement: A: Patient did state that she completed some of her HEP for OT. Patient practiced handwriting with decreased fluidity and control of pencil. Pt used adapted pencil grip which helped with control.  OT Plan: P: Review handwriting HEP. Attempt light cooking task.   Goals Short Term Goals Time to Complete Short Term Goals: 4 weeks Short Term Goal 1: Patient will be educated on HEP. Short Term Goal 1 Progress: Progressing toward goal Short Term Goal 2: Patient will increase right grip strength by 5 pounds and pinch strength by 3 pounds for increased ability to open containers. Short Term Goal 2 Progress: Progressing toward  goal Short Term Goal 3: Patient will use her right hand as a gross assist with all daily activities. Short Term Goal 4: Patient will increase fine motor coordination to be able to perform buttons and zippers without difficulty. Short Term Goal 5: Patient will increase RUE strength to 4-/5 to be able to transition from sit to stand with less difficulty. Long Term Goals Time to Complete Long Term Goals: 8 weeks Long Term Goal 1: Patient will return to highest level of independence with daily and leisure activities. Long Term Goal 1 Progress: Progressing toward goal Long Term Goal 2: Patient will increase right grip strength by 8 pounds and pinch strength by 5 pounds for increased ability to write and sign name. Long Term Goal 2 Progress: Progressing toward goal Long Term Goal 3: Patient will use her right hand as a dominant assist with all daily activities. Long Term Goal 3 Progress: Progressing toward goal Long Term Goal 4: Patient will increase fine motor coordination to be able to complete simple meal prep task. Long Term Goal 4 Progress: Progressing toward goal Long Term Goal 5: Patient will increase RUE strength to 4/5 to be able to transition from sit to stand with less difficulty.  Problem List Patient Active Problem List   Diagnosis Date Noted  . Lack of coordination 02/19/2013  . Muscle weakness (generalized) 02/19/2013  . Dysarthria as late effect of stroke 02/04/2013  . Bilateral carotid artery disease 02/04/2013  . Other and unspecified hyperlipidemia 02/04/2013  . Adenocarcinoma 10/19/2011  . Pneumothorax of left lung after biopsy 10/14/2011    Class: Acute  . CVA (cerebral infarction) 09/17/2011  . HTN (hypertension) 09/17/2011    End of Session Activity Tolerance: Patient  tolerated treatment well General Behavior During Therapy: [facility] for tasks assessed/performed Cognition: [facility] for tasks performed   Limmie Patricia, OTR/L,CBIS   04/02/2013, 11:04 AM

## 2013-04-04 ENCOUNTER — Ambulatory Visit (HOSPITAL_COMMUNITY)
Admission: RE | Admit: 2013-04-04 | Discharge: 2013-04-04 | Disposition: A | Discharge status: Home / Self Care | Payer: Medicare Other | Source: Ambulatory Visit | Attending: Family Medicine | Admitting: Family Medicine

## 2013-04-04 NOTE — Progress Notes (Signed)
Occupational Therapy Treatment Patient Details  Name: Stephanie [name] MRN: [id number] Date of Birth: [birth year]  Today's Date: 04/04/2013 Time: 1020-1103 OT Time Calculation (min): 43 min Neuro reeducation 83' Visit#: 7 of 16  Re-eval: 04/25/13    Authorization: [employer]BS Medicare  Authorization Time Period: before 15th visit  Authorization Visit#: 7 of 15  Subjective  S:  I did the homework.  (reviewed handwriting HEP and patient completed with fair+ to good - legibility and form.) Pain Assessment Currently in Pain?: No/denies  Precautions/Restrictions   n/a  Exercise/Treatments Hand Exercises Theraputty - Flatten: pink Theraputty - Roll: pink Theraputty - Locate Pegs: pink located 10 beads Other Hand Exercises: Handwriting activity:  tracing of small shapes.  Patient completed with min difficulty.  OTR/L notes that patient is using larger arm movements to move pen rather than her wrist and hand.  Other Hand Exercises: in hand manipulation translating bead from fingertips to palm 10 times.         Occupational Therapy Assessment and Plan OT Assessment and Plan Clinical Impression Statement: A:  Patient utilizing larger arm movements to manipulate pen, rather than wrist and hand movments.  Discussed with patient, and with cuing, she was able to move the pencil with wrist and hand movements.  OT Plan: P:  Attempt light cooking task, increase automatic use of smaller muscle groups with handwriting.    Goals Short Term Goals Time to Complete Short Term Goals: 4 weeks Short Term Goal 1: Patient will be educated on HEP. Short Term Goal 1 Progress: Progressing toward goal Short Term Goal 2: Patient will increase right grip strength by 5 pounds and pinch strength by 3 pounds for increased ability to open containers. Short Term Goal 2 Progress: Progressing toward goal Short Term Goal 3: Patient will use her right hand as a gross assist with all daily activities. Short Term Goal 4: Patient  will increase fine motor coordination to be able to perform buttons and zippers without difficulty. Short Term Goal 5: Patient will increase RUE strength to 4-/5 to be able to transition from sit to stand with less difficulty. Long Term Goals Time to Complete Long Term Goals: 8 weeks Long Term Goal 1: Patient will return to highest level of independence with daily and leisure activities. Long Term Goal 1 Progress: Progressing toward goal Long Term Goal 2: Patient will increase right grip strength by 8 pounds and pinch strength by 5 pounds for increased ability to write and sign name. Long Term Goal 2 Progress: Progressing toward goal Long Term Goal 3: Patient will use her right hand as a dominant assist with all daily activities. Long Term Goal 3 Progress: Progressing toward goal Long Term Goal 4: Patient will increase fine motor coordination to be able to complete simple meal prep task. Long Term Goal 4 Progress: Progressing toward goal Long Term Goal 5: Patient will increase RUE strength to 4/5 to be able to transition from sit to stand with less difficulty.  Problem List Patient Active Problem List   Diagnosis Date Noted  . Lack of coordination 02/19/2013  . Muscle weakness (generalized) 02/19/2013  . Dysarthria as late effect of stroke 02/04/2013  . Bilateral carotid artery disease 02/04/2013  . Other and unspecified hyperlipidemia 02/04/2013  . Adenocarcinoma 10/19/2011  . Pneumothorax of left lung after biopsy 10/14/2011    Class: Acute  . CVA (cerebral infarction) 09/17/2011  . HTN (hypertension) 09/17/2011    End of Session Activity Tolerance: Patient tolerated treatment well General Behavior  During Therapy: WFL for tasks assessed/performed Cognition: WFL for tasks performed OT Plan of Care OT Home Exercise Plan: tracing activity involving curved and straight lines.  OT Patient Instructions: worksheets  GO    Shirlean Mylar, OTR/L  04/04/2013, 11:45 AM

## 2013-04-04 NOTE — Progress Notes (Signed)
Speech Language Pathology Treatment Patient Details  Name: Stephanie [name] MRN: [id number] Date of Birth: Aug 01, 1941  Today's Date: 04/04/2013 Time: 0930-1015 SLP Time Calculation (min): 45 min  Authorization: BCBS [place] Medicare  Authorization Time Period: 03/21/2013-04/29/2013  Authorization Visit#:  3 of 11   HPI:  Symptoms/Limitations Symptoms: Doing well. Pain Assessment Currently in Pain?: No/denies   Treatment  Aphasia Therapy Dysarthria Therapy Patient/Family Education Home Exercise Program  SLP Goals  Home Exercise SLP Goal: Patient will Perform Home Exercise Program: Independently SLP Goal: Perform Home Exercise Program - Progress: Progressing toward goal SLP Short Term Goals SLP Short Term Goal 1: Pt will complete moderately complex responsive and confrontation naming tasks with 90% acc and mi/mod cues. SLP Short Term Goal 1 - Progress: Met (Change to 90% min cues) SLP Short Term Goal 2: Pt will verbalize 10+ items in concrete categories with min cues. SLP Short Term Goal 2 - Progress: Partly met SLP Short Term Goal 3: Pt will complete paragraph level reading comprehension tasks with 90% acc and min cues. SLP Short Term Goal 3 - Progress: Progressing toward goal SLP Short Term Goal 4: Pt will verbalize 8+ word sentence in picture description task with mod assist for strategies. SLP Short Term Goal 4 - Progress: Met (change to min assist) SLP Short Term Goal 5: Pt will complete moderately complex descriptive naming tasks with at least 4 attribues with min cues. SLP Short Term Goal 5 - Progress: Partly met SLP Long Term Goals SLP Long Term Goal 1: Increase verbal expression to mild impairment in home and community environment with use of strategies as needed. SLP Long Term Goal 1 - Progress: Progressing toward goal  Assessment/Plan  Patient Active Problem List   Diagnosis Date Noted  . Lack of coordination 02/19/2013  . Muscle weakness (generalized) 02/19/2013  .  Dysarthria as late effect of stroke 02/04/2013  . Bilateral carotid artery disease 02/04/2013  . Other and unspecified hyperlipidemia 02/04/2013  . Adenocarcinoma 10/19/2011  . Pneumothorax of left lung after biopsy 10/14/2011    Class: Acute  . CVA (cerebral infarction) 09/17/2011  . HTN (hypertension) 09/17/2011   SLP - End of Session Activity Tolerance: Patient tolerated treatment well General Behavior During Therapy: WFL for tasks assessed/performed Cognition: WFL for tasks performed  SLP Assessment/Plan Clinical Impression Statement: [name] practiced reading her multisyllabic word list for homework. In session today: sentence generation task= 100% mod assist, recalling brand names= 88% min cues, naming (famous people)= 100% mi/mod assist, and generational naming task= 14 items (animals) in one minute with moderate cueing. Complete G code next session. Speech Therapy Frequency: min 2x/week Duration: 4 weeks Potential to Achieve Goals: Good  GN    [name] 04/04/2013, 11:43 AM

## 2013-04-09 ENCOUNTER — Ambulatory Visit (HOSPITAL_COMMUNITY): Payer: Medicare Other | Admitting: Speech Pathology

## 2013-04-09 ENCOUNTER — Ambulatory Visit (HOSPITAL_COMMUNITY)
Admission: RE | Admit: 2013-04-09 | Discharge: 2013-04-09 | Disposition: A | Discharge status: Home / Self Care | Payer: Medicare Other | Source: Ambulatory Visit | Attending: Family Medicine | Admitting: Family Medicine

## 2013-04-09 NOTE — Progress Notes (Signed)
Speech Language Pathology Treatment Patient Details  Name: Stephanie [name] MRN: [id number] Date of Birth: 01-Sep-1941  Today's Date: 04/09/2013 Time: 3086-5784 SLP Time Calculation (min): 45 min  Authorization: BCBS [place] Medicare  Authorization Time Period: 03/21/2013-04/29/2013  Authorization Visit#:  4 of 11   HPI:  Symptoms/Limitations Symptoms: "I forgot my folder today." Pain Assessment Currently in Pain?: No/denies   Treatment  Aphasia Therapy Dysarthria Therapy Patient/Family Education Home Exercise Program  SLP Goals  Home Exercise SLP Goal: Patient will Perform Home Exercise Program: Independently SLP Goal: Perform Home Exercise Program - Progress: Progressing toward goal SLP Short Term Goals SLP Short Term Goal 1: Pt will complete moderately complex responsive and confrontation naming tasks with 90% acc and mi/mod cues. SLP Short Term Goal 1 - Progress: Progressing toward goal SLP Short Term Goal 2: Pt will verbalize 10+ items in concrete categories with min cues. SLP Short Term Goal 2 - Progress: Progressing toward goal SLP Short Term Goal 3: Pt will complete paragraph level reading comprehension tasks with 90% acc and min cues. SLP Short Term Goal 3 - Progress: Progressing toward goal SLP Short Term Goal 4: Pt will verbalize 8+ word sentence in picture description task with mod assist for strategies. SLP Short Term Goal 4 - Progress: Progressing toward goal SLP Short Term Goal 5: Pt will complete moderately complex descriptive naming tasks with at least 4 attribues with min cues. SLP Short Term Goal 5 - Progress: Progressing toward goal SLP Long Term Goals SLP Long Term Goal 1: Increase verbal expression to mild impairment in home and community environment with use of strategies as needed. SLP Long Term Goal 1 - Progress: Progressing toward goal  Assessment/Plan  Patient Active Problem List   Diagnosis Date Noted  . Lack of coordination 02/19/2013  . Muscle weakness  (generalized) 02/19/2013  . Dysarthria as late effect of stroke 02/04/2013  . Bilateral carotid artery disease 02/04/2013  . Other and unspecified hyperlipidemia 02/04/2013  . Adenocarcinoma 10/19/2011  . Pneumothorax of left lung after biopsy 10/14/2011    Class: Acute  . CVA (cerebral infarction) 09/17/2011  . HTN (hypertension) 09/17/2011   SLP - End of Session Activity Tolerance: Patient tolerated treatment well General Behavior During Therapy: WFL for tasks assessed/performed Cognition: WFL for tasks performed  SLP Assessment/Plan Clinical Impression Statement: Stephanie [name] forgot her therapy folder today. She states that she completed her HW over the week end. In naming tasks, she completed word retrieval tasks with 78% acc and needed min cues. She required mild/mod cues to name items in concrete categories (10+).  She benefitted from visualization strategy to identify a few more items in each category. I have asked her to bring her folder this Friday so that the reading comprehension work can be reviewed with therapist. Speech Therapy Frequency: min 2x/week Duration: 2 weeks Potential to Achieve Goals: Good      Stephanie [name] 04/09/2013, 2:00 PM

## 2013-04-12 ENCOUNTER — Ambulatory Visit (HOSPITAL_COMMUNITY)
Admission: RE | Admit: 2013-04-12 | Discharge: 2013-04-12 | Disposition: A | Discharge status: Home / Self Care | Payer: Medicare Other | Source: Ambulatory Visit | Attending: Family Medicine | Admitting: Family Medicine

## 2013-04-12 DIAGNOSIS — I69322 Dysarthria following cerebral infarction: Secondary | ICD-10-CM

## 2013-04-12 NOTE — Progress Notes (Signed)
Speech Language Pathology Treatment Patient Details  Name: Stephanie [name] MRN: [id number] Date of Birth: 1941/06/06  Today's Date: 04/12/2013 Time: 0915-1000 SLP Time Calculation (min): 45 min  Authorization: BCBS [place] Medicare  Authorization Time Period: 03/21/2013-04/29/2013  Authorization Visit#:  4 of 11      SLP Goals  Home Exercise SLP Goal: Patient will Perform Home Exercise Program: Independently SLP Goal: Perform Home Exercise Program - Progress: Progressing toward goal SLP Short Term Goals SLP Short Term Goal 1 - Progress: Progressing toward goal SLP Short Term Goal 2 - Progress: Progressing toward goal SLP Short Term Goal 3 - Progress: Progressing toward goal SLP Short Term Goal 4 - Progress: Progressing toward goal SLP Short Term Goal 5 - Progress: Progressing toward goal SLP Long Term Goals SLP Long Term Goal 1 - Progress: Progressing toward goal  Assessment/Plan  Patient Active Problem List   Diagnosis Date Noted  . Lack of coordination 02/19/2013  . Muscle weakness (generalized) 02/19/2013  . Dysarthria as late effect of stroke 02/04/2013  . Bilateral carotid artery disease 02/04/2013  . Other and unspecified hyperlipidemia 02/04/2013  . Adenocarcinoma 10/19/2011  . Pneumothorax of left lung after biopsy 10/14/2011    Class: Acute  . CVA (cerebral infarction) 09/17/2011  . HTN (hypertension) 09/17/2011   SLP - End of Session Activity Tolerance: Patient tolerated treatment well General Behavior During Therapy: WFL for tasks assessed/performed Cognition: WFL for tasks performed  SLP Assessment/Plan Clinical Impression Statement: Pt seen for ST on this date, where SLP initially reviewed pt's homework in folder. Pt did well and stated that she would like more homeword today. SLP reviewed word retrieval tasks incorporating negation, expression of abstract language, word organization using attributes, and reading comprehension of paragraphs. Pt required mild to mod  cueing for each task and also benefited from visual aids. Overall accuracy was 75%. Pt was provided with several homework worksheets.  Speech Therapy Frequency: min 2x/week Duration: 2 weeks Treatment/Interventions: Compensatory techniques;SLP instruction and feedback;Cueing hierarchy;Compensatory strategies;Patient/family education;Multimodal communcation approach Potential to Achieve Goals: Good Potential Considerations: Other (comment)  GN    [name] 04/12/2013, 10:08 AM

## 2013-04-15 ENCOUNTER — Inpatient Hospital Stay (HOSPITAL_COMMUNITY): Admission: RE | Admit: 2013-04-15 | Payer: Medicare Other | Source: Ambulatory Visit

## 2013-04-15 ENCOUNTER — Telehealth (HOSPITAL_COMMUNITY): Payer: Self-pay

## 2013-04-15 NOTE — Telephone Encounter (Signed)
Called home phone number to inquire about missed appointment today. Busy signal.  Limmie Patricia, OTR/L,CBIS  04/15/13 1:17PM

## 2013-04-16 ENCOUNTER — Ambulatory Visit (HOSPITAL_COMMUNITY): Payer: Medicare Other | Admitting: Speech Pathology

## 2013-04-16 ENCOUNTER — Ambulatory Visit (HOSPITAL_COMMUNITY): Payer: Medicare Other

## 2013-04-19 ENCOUNTER — Ambulatory Visit (HOSPITAL_COMMUNITY)
Admission: RE | Admit: 2013-04-19 | Discharge: 2013-04-19 | Disposition: A | Discharge status: Home / Self Care | Payer: Medicare Other | Source: Ambulatory Visit | Attending: Speech Pathology | Admitting: Speech Pathology

## 2013-04-19 ENCOUNTER — Telehealth: Payer: Self-pay | Admitting: Family Medicine

## 2013-04-19 DIAGNOSIS — I69322 Dysarthria following cerebral infarction: Secondary | ICD-10-CM

## 2013-04-19 NOTE — Telephone Encounter (Signed)
Patient needs a refill of her BP medication, but Stephanie [name] is unsure of what its called. She says she does need the lisinopril, but there was another one that controlled her cough while taking this. Please advise. She would like someone to call her back so they can figure out which.

## 2013-04-19 NOTE — Progress Notes (Signed)
Speech Language Pathology Treatment Patient Details  Name: Stephanie [name] MRN: [id number] Date of Birth: 05/08/1941  Today's Date: 04/19/2013 Time: 0930-1015 SLP Time Calculation (min): 45 min  Authorization: BCBS [place] Medicare  Authorization Time Period: 03/21/2013-04/29/2013  Authorization Visit#:  4 of 11   HPI:  Symptoms/Limitations Symptoms: Pt stated that she forgot folder today and that she forgot her appointment on Monday. Pain Assessment Currently in Pain?: No/denies Multiple Pain Sites: No  SLP Goals  Home Exercise SLP Goal: Patient will Perform Home Exercise Program: Independently SLP Short Term Goals SLP Short Term Goal 1: Pt will complete moderately complex responsive and confrontation naming tasks with 90% acc and mi/mod cues. SLP Short Term Goal 1 - Progress: Progressing toward goal SLP Short Term Goal 2: Pt will verbalize 10+ items in concrete categories with min cues. SLP Short Term Goal 2 - Progress: Progressing toward goal SLP Short Term Goal 3: Pt will complete paragraph level reading comprehension tasks with 90% acc and min cues. SLP Short Term Goal 3 - Progress: Progressing toward goal SLP Short Term Goal 4: Pt will verbalize 8+ word sentence in picture description task with mod assist for strategies. SLP Short Term Goal 4 - Progress: Progressing toward goal SLP Short Term Goal 5: Pt will complete moderately complex descriptive naming tasks with at least 4 attribues with min cues. SLP Short Term Goal 5 - Progress: Progressing toward goal SLP Long Term Goals SLP Long Term Goal 1: Increase verbal expression to mild impairment in home and community environment with use of strategies as needed. SLP Long Term Goal 1 - Progress: Progressing toward goal  Assessment/Plan  Patient Active Problem List   Diagnosis Date Noted  . Lack of coordination 02/19/2013  . Muscle weakness (generalized) 02/19/2013  . Dysarthria as late effect of stroke 02/04/2013  . Bilateral  carotid artery disease 02/04/2013  . Other and unspecified hyperlipidemia 02/04/2013  . Adenocarcinoma 10/19/2011  . Pneumothorax of left lung after biopsy 10/14/2011    Class: Acute  . CVA (cerebral infarction) 09/17/2011  . HTN (hypertension) 09/17/2011   SLP - End of Session Activity Tolerance: Patient tolerated treatment well General Behavior During Therapy: WFL for tasks assessed/performed Cognition: WFL for tasks performed  SLP Assessment/Plan Clinical Impression Statement: Pt seen for ST on this date. Pt stated that she forgot her folder and also her appointment on Monday. SLP provided pt with visual tasks utilizing expressive language, where pt was asked to describe pictures and explain differences between them. Pt did so accurately, but required max cueing for moments of anomia. Next, SLP had pt practice writing single letters, where she cont to present with significant difficulty. Will relay this to OT. Next, SLP had pt name items in categories where she did so with ~10 words per category with min assist. SLP reminded pt to bring in folder to next session and provided pt with homework worksheets.  Speech Therapy Frequency: min 2x/week Duration: 2 weeks Treatment/Interventions: Compensatory techniques;SLP instruction and feedback;Cueing hierarchy;Compensatory strategies;Patient/family education;Multimodal communcation approach Potential to Achieve Goals: Good  GN    Neville Walston S 04/19/2013, 10:37 AM

## 2013-04-19 NOTE — Telephone Encounter (Signed)
NTC

## 2013-04-19 NOTE — Progress Notes (Signed)
Occupational Therapy Treatment Patient Details  Name: Stephanie [name] MRN: [id number] Date of Birth: 1941-08-24  Today's Date: 04/19/2013 Time: 1020-1105 OT Time Calculation (min): 45 min ADL 45' Visit#: 8 of 16  Re-eval: 04/25/13    Authorization: [employer] Medicare  Authorization Time Period: before 15th visit  Authorization Visit#: 8 of 15  Subjective Symptoms/Limitations Symptoms: S:  I dont cook anymore.  My son cooks dinner, and I eat out every morning at [employer].  Pain Assessment Currently in Pain?: No/denies Multiple Pain Sites: No   Exercise/Treatments    Activities of Daily Living Activities of Daily Living: ADL task completed this date to assess safety and independence level with light meal prep.  Cooking task of preparing cookies from a mix was introduced to patient.  Therapist oriented patient to the kitchen and informed her that I would be watching her for safety concerns and was here to help if needed, if not she should just progress with the task as she normally would.  Stephanie [name] was able to retrieve a bowl and spoon and the cookie mix independently.  She required assistance reading the package accurately (was getting a cup vs 1/2 cup of butter), required step by step cues of what needed to go into the cookes (forgot to add the cookie mix.)  I  cued her to turn the oven on, she misread the temperature on the package as 350 not 375.  I corrected her.  She then set the stove to 425 and I cued her to correct it.  She left the faucet running after she retrieved the water, and had to be cued to turn it off.  She required assistance with cutting open the package of cookie mix.  She requested assistance with stirring the cookes, WHen asked why she could not do it, she stated the spoon was too big, I gave her a smaller spoon and got the mix 50% stirred.  She sat down to stir the remaining mix.  I demonstrated how to place the cookes on the cookie sheet, and she did approx 9 cookies before  stopping.  I finished out the placement of the cookes, placed them in the oven, and Removed them.  She independently washed dishes.  We discussed this task and agreed that she should not do any type of stove cooking without her son home.  I  inquired about her breakfast at [employer] - she always gets a coke and a jelly biscuit.  She told me the price, the amount she gives the cashier, and how much money she gets back.    Occupational Therapy Assessment and Plan OT Assessment and Plan Clinical Impression Statement: A:  Patient required extensive assistance and was not safe with unfamiliar cooking task.  With routine task of dishes and going to [employer] for breakfast, she is independent.  Recommended to patient that she not cook on stove if home alone - she states that she does not.   OT Plan: P:  Reassess.   Goals Short Term Goals Time to Complete Short Term Goals: 4 weeks Short Term Goal 1: Patient will be educated on HEP. Short Term Goal 1 Progress: Met Short Term Goal 2: Patient will increase right grip strength by 5 pounds and pinch strength by 3 pounds for increased ability to open containers. Short Term Goal 3: Patient will use her right hand as a gross assist with all daily activities. Short Term Goal 4: Patient will increase fine motor coordination to be able to perform  buttons and zippers without difficulty. Short Term Goal 5: Patient will increase RUE strength to 4-/5 to be able to transition from sit to stand with less difficulty. Long Term Goals Time to Complete Long Term Goals: 8 weeks Long Term Goal 1: Patient will return to highest level of independence with daily and leisure activities. Long Term Goal 1 Progress: Progressing toward goal Long Term Goal 2: Patient will increase right grip strength by 8 pounds and pinch strength by 5 pounds for increased ability to write and sign name. Long Term Goal 2 Progress: Progressing toward goal Long Term Goal 3: Patient will use her right  hand as a dominant assist with all daily activities. Long Term Goal 4: Patient will increase fine motor coordination to be able to complete simple meal prep task. Long Term Goal 4 Progress: Progressing toward goal Long Term Goal 5: Patient will increase RUE strength to 4/5 to be able to transition from sit to stand with less difficulty. Long Term Goal 5 Progress: Progressing toward goal  Problem List Patient Active Problem List   Diagnosis Date Noted  . Lack of coordination 02/19/2013  . Muscle weakness (generalized) 02/19/2013  . Dysarthria as late effect of stroke 02/04/2013  . Bilateral carotid artery disease 02/04/2013  . Other and unspecified hyperlipidemia 02/04/2013  . Adenocarcinoma 10/19/2011  . Pneumothorax of left lung after biopsy 10/14/2011    Class: Acute  . CVA (cerebral infarction) 09/17/2011  . HTN (hypertension) 09/17/2011    End of Session Activity Tolerance: Patient tolerated treatment well General Behavior During Therapy: [facility] for tasks assessed/performed Cognition: WFL for tasks performed  GO    Shirlean Mylar, OTR/L  04/19/2013, 12:04 PM

## 2013-04-19 NOTE — Telephone Encounter (Signed)
Left message to return call on voicemail 

## 2013-04-23 ENCOUNTER — Ambulatory Visit (HOSPITAL_COMMUNITY): Payer: Medicare Other

## 2013-04-24 ENCOUNTER — Inpatient Hospital Stay (HOSPITAL_COMMUNITY): Admission: RE | Admit: 2013-04-24 | Payer: Medicare Other | Source: Ambulatory Visit | Admitting: Speech Pathology

## 2013-04-25 ENCOUNTER — Ambulatory Visit (HOSPITAL_COMMUNITY): Payer: Medicare Other

## 2013-04-26 ENCOUNTER — Telehealth (HOSPITAL_COMMUNITY): Payer: Self-pay

## 2013-04-26 ENCOUNTER — Inpatient Hospital Stay (HOSPITAL_COMMUNITY): Admission: RE | Admit: 2013-04-26 | Payer: Medicare Other | Source: Ambulatory Visit | Admitting: Speech Pathology

## 2013-06-11 ENCOUNTER — Encounter: Payer: Self-pay | Admitting: Family Medicine

## 2013-06-11 ENCOUNTER — Ambulatory Visit (HOSPITAL_COMMUNITY)
Admission: RE | Admit: 2013-06-11 | Discharge: 2013-06-11 | Disposition: A | Discharge status: Home / Self Care | Payer: Medicare Other | Source: Ambulatory Visit | Attending: Family Medicine | Admitting: Family Medicine

## 2013-06-11 ENCOUNTER — Ambulatory Visit (INDEPENDENT_AMBULATORY_CARE_PROVIDER_SITE_OTHER): Payer: Medicare Other | Admitting: Family Medicine

## 2013-06-11 VITALS — BP 142/88 | Ht 67.0 in | Wt 154.4 lb

## 2013-06-11 DIAGNOSIS — J8410 Pulmonary fibrosis, unspecified: Secondary | ICD-10-CM | POA: Insufficient documentation

## 2013-06-11 DIAGNOSIS — I10 Essential (primary) hypertension: Secondary | ICD-10-CM

## 2013-06-11 DIAGNOSIS — R079 Chest pain, unspecified: Secondary | ICD-10-CM

## 2013-06-11 DIAGNOSIS — I517 Cardiomegaly: Secondary | ICD-10-CM | POA: Insufficient documentation

## 2013-06-11 MED ORDER — SIMVASTATIN 20 MG PO TABS: ORAL_TABLET | ORAL | Status: DC

## 2013-06-11 MED ORDER — AMLODIPINE BESYLATE 2.5 MG PO TABS: 2.5000 mg | ORAL_TABLET | Freq: Every day | ORAL | Status: DC

## 2013-06-11 MED ORDER — ETODOLAC 300 MG PO CAPS: ORAL_CAPSULE | ORAL | Status: AC

## 2013-06-11 NOTE — Progress Notes (Signed)
  Subjective:    Patient ID: Stephanie [name], female    DOB: [birth year], 72 y.o.   MRN: [id number]  Back Pain This is a new problem. The current episode started in the past 7 days. The problem occurs intermittently.   Pain occurs when patient gets up and down or with movement- no pain at rest. Patient states pain present for the past 5-6 days no known injury. Has history of lung cancer. It is followed by oncology. So far has not had any reoccurrences. She is scheduled for followup in September. Patient also feels she is having some cough related to lisinopril would like to try something different.  Review of Systems  Musculoskeletal: Positive for back pain.     Patient also states that one of her meds is causing her to cough.     Objective:   Physical Exam Lungs are clear hearts regular pulse normal extremities no edema she has moderate tenderness in the right paraspinal muscles and thoracic rib area. No crackles heard.      Assessment & Plan:  #1 HTN side effect with medication stop lisinopril. Recommend amlodipine 2.5 mg daily followup in 2 weeks #2 right side rib pain-x-rays and rib x-rays if pain persists beyond the next 2 weeks May need bone scan or other similar measures make sure there is not a cancer recurrence. #3 anti-inflammatory prescribed for the next 2 weeks if it bothers her stomach stop it #4 patient was counseled regarding colonoscopy she refuses

## 2013-06-14 ENCOUNTER — Telehealth: Payer: Self-pay | Admitting: Family Medicine

## 2013-06-14 NOTE — Telephone Encounter (Signed)
Notified daughter card mailed to home and that xray come back normal.

## 2013-06-14 NOTE — Telephone Encounter (Signed)
Calling to check on xray results

## 2013-06-25 ENCOUNTER — Ambulatory Visit: Payer: Medicare Other | Admitting: Family Medicine

## 2013-07-18 ENCOUNTER — Ambulatory Visit (HOSPITAL_COMMUNITY): Payer: Medicare Other

## 2013-07-18 ENCOUNTER — Other Ambulatory Visit (HOSPITAL_COMMUNITY): Payer: Medicare Other

## 2013-07-22 ENCOUNTER — Ambulatory Visit (HOSPITAL_COMMUNITY): Payer: Medicare Other

## 2013-07-22 ENCOUNTER — Ambulatory Visit: Payer: Medicare Other | Admitting: Family Medicine

## 2013-07-22 ENCOUNTER — Telehealth: Payer: Self-pay | Admitting: Family Medicine

## 2013-07-22 NOTE — Telephone Encounter (Signed)
FYI - Ms Stephanie [name] was to come in today per you for Possible Stroke, daughter called to state mom refuses to come so we cancelled the appt.

## 2013-07-22 NOTE — Telephone Encounter (Signed)
Please try to connect with the patient and ordered daughter to let them know we truly would like for this patient comes in if not today then sometime this week for further evaluation

## 2013-07-22 NOTE — Telephone Encounter (Signed)
Talked with pt's daughter and she will talk with mother and try to get her to change her mind and call us back.

## 2013-07-23 ENCOUNTER — Encounter: Payer: Self-pay | Admitting: Family Medicine

## 2013-07-23 ENCOUNTER — Ambulatory Visit (INDEPENDENT_AMBULATORY_CARE_PROVIDER_SITE_OTHER): Payer: Medicare Other | Admitting: Family Medicine

## 2013-07-23 VITALS — BP 152/72 | Ht 67.0 in | Wt 153.2 lb

## 2013-07-23 DIAGNOSIS — Z23 Encounter for immunization: Secondary | ICD-10-CM

## 2013-07-23 DIAGNOSIS — I679 Cerebrovascular disease, unspecified: Secondary | ICD-10-CM

## 2013-07-23 DIAGNOSIS — F329 Major depressive disorder, single episode, unspecified: Secondary | ICD-10-CM

## 2013-07-23 MED ORDER — CITALOPRAM HYDROBROMIDE 10 MG PO TABS: 10.0000 mg | ORAL_TABLET | Freq: Every day | ORAL | Status: DC

## 2013-07-23 NOTE — Progress Notes (Signed)
  Subjective:    Patient ID: Stephanie [name], female    DOB: 1941/07/16, 72 y.o.   MRN: [id number]  HPI Pt is here today b/c daughter thinks pt is depressed. The pt called her daughter 3 times this past weekend and was very upset. She was having episodes of crying and sadness. The daughter called 911 thinking the pt had a stroke, but the medical team said there were no signs/symptoms of a stroke.  Long discussion held with family. Patient tends to get upset easily cries a lot her emotions tend to run right at the surface. In addition to all of this patient has had strokes in the past she was concerned she was having a stroke the other day. She now relates that things are going a little bit better. 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

## 2013-07-23 NOTE — Patient Instructions (Signed)
Increase Amlodipine from 2.5 to 5 mg daily

## 2013-07-26 ENCOUNTER — Encounter (HOSPITAL_COMMUNITY): Payer: Self-pay

## 2013-07-26 ENCOUNTER — Encounter (HOSPITAL_COMMUNITY): Payer: Medicare Other | Attending: Oncology

## 2013-07-26 VITALS — BP 127/78 | HR 61 | Temp 98.0°F | Resp 18 | Wt 150.6 lb

## 2013-07-26 DIAGNOSIS — I10 Essential (primary) hypertension: Secondary | ICD-10-CM

## 2013-07-26 DIAGNOSIS — C3410 Malignant neoplasm of upper lobe, unspecified bronchus or lung: Secondary | ICD-10-CM

## 2013-07-26 DIAGNOSIS — C801 Malignant (primary) neoplasm, unspecified: Secondary | ICD-10-CM

## 2013-07-26 DIAGNOSIS — Z85118 Personal history of other malignant neoplasm of bronchus and lung: Secondary | ICD-10-CM | POA: Insufficient documentation

## 2013-07-26 DIAGNOSIS — J984 Other disorders of lung: Secondary | ICD-10-CM

## 2013-07-26 DIAGNOSIS — I639 Cerebral infarction, unspecified: Secondary | ICD-10-CM

## 2013-07-26 DIAGNOSIS — Z09 Encounter for follow-up examination after completed treatment for conditions other than malignant neoplasm: Secondary | ICD-10-CM | POA: Insufficient documentation

## 2013-07-26 LAB — LACTATE DEHYDROGENASE: LDH: 241 U/L (ref 94–250)

## 2013-07-26 LAB — COMPREHENSIVE METABOLIC PANEL
ALT: 8 U/L (ref 0–35)
AST: 18 U/L (ref 0–37)
Albumin: 3.7 g/dL (ref 3.5–5.2)
Alkaline Phosphatase: 66 U/L (ref 39–117)
BUN: 11 mg/dL (ref 6–23)
Calcium: 9.9 mg/dL (ref 8.4–10.5)
Chloride: 101 mEq/L (ref 96–112)
GFR calc non Af Amer: 47 mL/min — ABNORMAL LOW (ref >90)
Glucose, Bld: 101 mg/dL — ABNORMAL HIGH (ref 70–99)
Potassium: 4.1 mEq/L (ref 3.5–5.1)
Sodium: 140 mEq/L (ref 135–145)
Total Protein: 7.2 g/dL (ref 6.0–8.3)

## 2013-07-26 LAB — CBC WITH DIFFERENTIAL/PLATELET
Basophils Absolute: 0 10*3/uL (ref 0.0–0.1)
Basophils Relative: 1 % (ref 0–1)
Eosinophils Absolute: 0.1 10*3/uL (ref 0.0–0.7)
Lymphs Abs: 1.1 10*3/uL (ref 0.7–4.0)
MCH: 33 pg (ref 26.0–34.0)
MCHC: 34.8 g/dL (ref 30.0–36.0)
Neutrophils Relative %: 60 % (ref 43–77)
Platelets: 177 10*3/uL (ref 150–400)
RBC: 4.21 MIL/uL (ref 3.87–5.11)

## 2013-07-26 NOTE — Patient Instructions (Addendum)
[facility] Discharge Instructions  RECOMMENDATIONS MADE BY THE CONSULTANT AND ANY TEST RESULTS WILL BE SENT TO YOUR REFERRING PHYSICIAN.  EXAM FINDINGS BY THE PHYSICIAN TODAY AND SIGNS OR SYMPTOMS TO REPORT TO CLINIC OR PRIMARY PHYSICIAN: Exam and findings as discussed by Dr. Zigmund Daniel.  Will do some scans and will see you back after the scans to determine if you would be a candidate for Tarceva.  MEDICATIONS PRESCRIBED:  none  INSTRUCTIONS/FOLLOW-UP: Follow-up after scans.  Thank you for choosing [facility] to provide your oncology and hematology care.  To afford each patient quality time with our providers, please arrive at least 15 minutes before your scheduled appointment time.  With your help, our goal is to use those 15 minutes to complete the necessary work-up to ensure our physicians have the information they need to help with your evaluation and healthcare recommendations.    Effective January 1st, 2014, we ask that you re-schedule your appointment with our physicians should you arrive 10 or more minutes late for your appointment.  We strive to give you quality time with our providers, and arriving late affects you and other patients whose appointments are after yours.    Again, thank you for choosing [facility].  Our hope is that these requests will decrease the amount of time that you wait before being seen by our physicians.       _____________________________________________________________  Should you have questions after your visit to [facility], please contact our office at [phone] between the hours of 8:30 a.m. and 5:00 p.m.  Voicemails left after 4:30 p.m. will not be returned until the following business day.  For prescription refill requests, have your pharmacy contact our office with your prescription refill request.

## 2013-07-26 NOTE — Progress Notes (Signed)
Stephanie [name] presented for labwork. Labs per MD order drawn via Peripheral Line 23 gauge needle inserted in right AC  Good blood return present. Procedure without incident.  Needle removed intact. Patient tolerated procedure well.   

## 2013-07-26 NOTE — Progress Notes (Signed)
[facility] OFFICE PROGRESS NOTE  LUKING,SCOTT, MD [address] [place] [address]  DIAGNOSIS: Adenocarcinoma - Plan: CT Chest Wo Contrast, CT Abdomen Pelvis W Contrast, amLODipine (NORVASC) 2.5 MG tablet, CT Abdomen W Contrast, CBC with Differential, Comprehensive metabolic panel, Lactate dehydrogenase, CBC with Differential, Comprehensive metabolic panel, Lactate dehydrogenase, CANCELED: CT Abdomen W Contrast  CVA (cerebral infarction) - Plan: CT Chest Wo Contrast, CT Abdomen Pelvis W Contrast, amLODipine (NORVASC) 2.5 MG tablet, CT Abdomen W Contrast, CBC with Differential, Comprehensive metabolic panel, Lactate dehydrogenase, CBC with Differential, Comprehensive metabolic panel, Lactate dehydrogenase, CANCELED: CT Abdomen W Contrast  HTN (hypertension) - Plan: CT Chest Wo Contrast, CT Abdomen Pelvis W Contrast, amLODipine (NORVASC) 2.5 MG tablet, CT Abdomen W Contrast, CBC with Differential, Comprehensive metabolic panel, Lactate dehydrogenase, CBC with Differential, Comprehensive metabolic panel, Lactate dehydrogenase, CANCELED: CT Abdomen W Contrast  Chief Complaint  Patient presents with  . Lung Cancer    CURRENT THERAPY: Watchful expectation  INTERVAL HISTORY: Stephanie [name] 72 y.o. female returns for followup of adenocarcinoma of the lung, status post thoracoscopic left upper lobectomy with mediastinal lymph node dissection for well-differentiated disease, exon 19 deletion positive. The patient received no postoperative treatment. She also suffers from bilateral pulmonary nodular and interstitial lung disease. She suffers from chronic fatigue and also shortness of breath on mild exertion without chest pain, PND, orthopnea, or palpitations. 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 She denies any melena, hematochezia, hematuria epistaxis, hemoptysis, skin rash other than ecchymoses, headache, or seizures.    MEDICAL HISTORY: Past Medical History  Diagnosis Date  . CVA (cerebral infarction)     speech difficulities  . Stroke   . Hypercholesteremia     Takes Zocor daily  . Hypertension     takes Atenolol and Lisinopril daily  . Lung mass     left upper lobe  . Arthritis     hands  . TIA (transient ischemic attack)     Sat before thanksgiving;impaired speech  . Pneumothorax   . Lung cancer     INTERIM HISTORY: has CVA (cerebral infarction); HTN (hypertension); Pneumothorax of left lung after biopsy; Adenocarcinoma of left lung, stage 1; Dysarthria as late effect of stroke; Bilateral carotid artery disease; Other and unspecified hyperlipidemia; Lack of coordination; and Muscle weakness (generalized) on her problem list.    ALLERGIES:  has No Known Allergies.  MEDICATIONS: has a current medication list which includes the following prescription(s): amlodipine, atenolol, simvastatin, aspirin ec, and citalopram.  SURGICAL HISTORY:  Past Surgical History  Procedure Laterality Date  . No past surgeries    . Lung biopsy  10/13/11    left  . Abdominal hysterectomy  1975  . Varicose vein surgery  71yrs ago  . Video assisted thoracoscopy  11/18/2011    Procedure: VIDEO ASSISTED THORACOSCOPY;  Surgeon: [name], MD;  Location: [facility] OR;  Service: Thoracic;  Laterality: Left;  . Lobectomy  11/18/2011    Procedure: LOBECTOMY;  Surgeon: [name], MD;  Location: [facility] OR;  Service: Thoracic;  Laterality: Left;  LEFT UPPER LOBECTOMY  . Breast biopsy  early 2000    FAMILY HISTORY: family history includes Cancer in her brother; Diabetes in her brother; Stroke in her brother, mother, and sister. There is no history of Anesthesia problems, Hypotension, Malignant hyperthermia, or Pseudochol deficiency.  SOCIAL HISTORY:  reports that she has never smoked. She has never used smokeless tobacco. She reports that  she does not drink alcohol or use illicit drugs.  REVIEW OF SYSTEMS:   Other than that discussed above is noncontributory.  PHYSICAL EXAMINATION: ECOG PERFORMANCE STATUS: 2 - Symptomatic, <50% confined to bed  Blood pressure 127/78, pulse 61, temperature 98 F (36.7 C), temperature source Oral, resp. rate 18, weight 150 lb 9.6 oz (68.312 kg).  GENERAL:alert, no distress and comfortable SKIN: skin color, texture, turgor are normal, no rashes or significant lesions. Bilateral upper extremity ecchymoses. EYES: normal, Conjunctiva are pink and non-injected, sclera clear OROPHARYNX:no exudate, no erythema and lips, buccal mucosa, and tongue normal  NECK: supple, thyroid normal size, non-tender, without nodularity CHEST: Normal AP diameter. LYMPH:  no palpable lymphadenopathy in the cervical, axillary or inguinal LUNGS: Bilateral inspiratory and expiratory crackles without dullness to percussion. No friction rub is heard. HEART: regular rate & rhythm and no murmurs and no lower extremity edema ABDOMEN:abdomen soft, non-tender and normal bowel sounds Musculoskeletal:no cyanosis of digits and no clubbing  NEURO: alert & oriented x 3 with fluent speech. Grade 3/5 right upper M.D. weakness.   LABORATORY DATA: No visits with results within 30 Day(s) from this visit. Latest known visit with results is:  Office Visit on 02/04/2013  Component Date Value Range Status  . Cholesterol 02/06/2013 185  0 - 200 mg/dL Final   Comment: ATP III Classification:                                < 200        mg/dL        Desirable                               200 - 239     mg/dL        Borderline High                               >= 240        mg/dL        High                             . Triglycerides 02/06/2013 96  <150 mg/dL Final  . HDL 81/19/1478 49  >39 mg/dL Final  . Total CHOL/HDL Ratio 02/06/2013 3.8   Final  . VLDL 02/06/2013 19  0 - 40 mg/dL Final  . LDL Cholesterol 02/06/2013 117* 0 - 99 mg/dL Final   Comment:                            Total Cholesterol/HDL  Ratio:CHD Risk                                                 Coronary Heart Disease Risk Table  Men       Women                                   1/2 Average Risk              3.4        3.3                                       Average Risk              5.0        4.4                                    2X Average Risk              9.6        7.1                                    3X Average Risk             23.4       11.0                          Use the calculated Patient Ratio above and the CHD Risk table                           to determine the patient's CHD Risk.                          ATP III Classification (LDL):                                < 100        mg/dL         Optimal                               100 - 129     mg/dL         Near or Above Optimal                               130 - 159     mg/dL         Borderline High                               160 - 189     mg/dL         High                                > 190        mg/dL         Very High  Urinalysis    Component Value Date/Time   COLORURINE YELLOW 11/16/2011 1600   APPEARANCEUR CLEAR 11/16/2011 1600   LABSPEC 1.021 11/16/2011 1600   PHURINE 5.0 11/16/2011 1600   GLUCOSEU NEGATIVE 11/16/2011 1600   HGBUR MODERATE* 11/16/2011 1600   BILIRUBINUR NEGATIVE 11/16/2011 1600   KETONESUR NEGATIVE 11/16/2011 1600   PROTEINUR 30* 11/16/2011 1600   UROBILINOGEN 1.0 11/16/2011 1600   NITRITE NEGATIVE 11/16/2011 1600   LEUKOCYTESUR NEGATIVE 11/16/2011 1600    RADIOGRAPHIC STUDIES:     *RADIOLOGY REPORT*  Clinical Data: Chest pain  CHEST - 2 VIEW  Comparison: 01/14/2013  Findings: Chronic interstitial accentuation noted. Pulmonary  nodules seen on prior chest CT are not readily apparent on chest  radiography. Stable left pleural thickening noted. Left heart  border indistinct. Borderline cardiomegaly.  No pleural effusion  or new opacity is identified in the lungs.  IMPRESSION:  1. Diffuse interstitial opacity favoring fibrosis.  2. Borderline cardiomegaly.  Original Report Authenticated By: Gaylyn Rong, M.D       *RADIOLOGY REPORT*  Clinical Data: Right upper rib pain.  RIGHT RIBS - 2 VIEW  Comparison: 06/11/2013  Findings: Two views of the right ribs were obtained. There is no  evidence for a displaced right rib fracture. Negative for a right  pneumothorax. Right shoulder and right AC joint appear to be  intact.  IMPRESSION:  No evidence for a right rib fracture.  Original Report Authenticated By: Richarda Overlie, M.D.  suspected on the basis of prior  CT.  CT CHEST WITHOUT CONTRAST  Technique: Multidetector CT imaging of the chest was performed  following the standard protocol without IV contrast.  Comparison: Postoperative chest CT 07/16/2012.  Findings: Mildly improved lung volumes today. Stable major airway  is. Postoperative changes compatible with left upper lobectomy  along the left mediastinum and hilum. Noncontrast mediastinal soft  tissues appears stable and within normal limits. Right  paratracheal lymph nodes measure up to 8 mm short axis. No  definite left hilar lymphadenopathy. No prevascular  lymphadenopathy identified; tortuous great vessels. No pericardial  or pleural effusion.  Underlying chronic widespread increased interstitial thickening and  reticular opacity in the lungs. Stable subpleural nodule left  lower lobe measuring 4 mm on series 3 image 29. Stable indistinct  peribronchovascular nodular areas in the right lung on images 25,  27, 28. Decreased right middle lobe peribronchovascular nodularity  which may be due to decreased atelectasis today. No new or  suspicious pulmonary opacity identified.  Stable visualized noncontrast upper abdominal viscera. Calcified  atherosclerosis of the aorta and its branches including the  coronary arteries. No axillary  lymphadenopathy.  Osteopenia. Degenerative changes in the spine. No acute or  suspicious osseous lesion. The  IMPRESSION:  Stable postoperative appearance of the chest.  Stable or decreased bilateral pulmonary nodules since 07/16/2012 as  described above, favor related to the underlying  chronic/inflammatory lung disease at this point. Continued imaging  follow-up recommended.  Original Report Authenticated By: Odessa Fleming III, M.   ASSESSMENT: #1. Stage I B. adenocarcinoma of the left upper lobe, status post thoracoscopic left upper lobectomy and mediastinal lymph node dissection, exon 19 deletion positive. #2. 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 #3. 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 #4. Hypertension, controlled.  PLAN: #1. A discussion was held with the patient and her daughter who accompanied her today. If the bilateral interstitial changes and pulmonary nodules represent recurrence or worsening of her initial bronchoalveolar like lung cancer, and  in the setting of exon 19 deletion, she would be an ideal candidate for therapy with Erlotinib. Information about the drug was given to her daughter. #2. Repeat staging CTs of the chest abdomen and pelvis with CBC, chem profile, and LDH to be done today. #3. Continue current medications. #4. Return with office visit in 2 weeks   All questions were answered. The patient knows to call the clinic with any problems, questions or concerns. We can certainly see the patient much sooner if necessary.   I spent 40 minutes counseling the patient face to face. The total time spent in the appointment was 30 minutes.    Maurilio Lovely, MD 07/26/2013 4:52 PM

## 2013-07-31 ENCOUNTER — Ambulatory Visit (HOSPITAL_COMMUNITY)
Admission: RE | Admit: 2013-07-31 | Discharge: 2013-07-31 | Disposition: A | Discharge status: Home / Self Care | Payer: Medicare Other | Source: Ambulatory Visit | Attending: Hematology and Oncology | Admitting: Hematology and Oncology

## 2013-07-31 ENCOUNTER — Encounter (HOSPITAL_COMMUNITY): Payer: Self-pay

## 2013-07-31 DIAGNOSIS — I639 Cerebral infarction, unspecified: Secondary | ICD-10-CM

## 2013-07-31 DIAGNOSIS — I10 Essential (primary) hypertension: Secondary | ICD-10-CM

## 2013-07-31 DIAGNOSIS — C801 Malignant (primary) neoplasm, unspecified: Secondary | ICD-10-CM

## 2013-07-31 DIAGNOSIS — C3490 Malignant neoplasm of unspecified part of unspecified bronchus or lung: Secondary | ICD-10-CM | POA: Insufficient documentation

## 2013-07-31 DIAGNOSIS — J8410 Pulmonary fibrosis, unspecified: Secondary | ICD-10-CM | POA: Insufficient documentation

## 2013-07-31 DIAGNOSIS — R911 Solitary pulmonary nodule: Secondary | ICD-10-CM | POA: Insufficient documentation

## 2013-07-31 MED ORDER — IOHEXOL 300 MG/ML  SOLN
100.0000 mL | Freq: Once | INTRAMUSCULAR | Status: AC | PRN
  Administered 2013-07-31: 100 mL via INTRAVENOUS

## 2013-07-31 MED ORDER — SODIUM CHLORIDE 0.9 % IJ SOLN
INTRAMUSCULAR | Status: AC
  Filled 2013-07-31: qty 250

## 2013-08-02 ENCOUNTER — Telehealth (HOSPITAL_COMMUNITY): Payer: Self-pay | Admitting: Hematology and Oncology

## 2013-08-02 ENCOUNTER — Telehealth (HOSPITAL_COMMUNITY): Payer: Self-pay

## 2013-08-02 ENCOUNTER — Other Ambulatory Visit (HOSPITAL_COMMUNITY): Payer: Self-pay | Admitting: Hematology and Oncology

## 2013-08-02 MED ORDER — ERLOTINIB HCL 25 MG PO TABS: 50.0000 mg | ORAL_TABLET | Freq: Every day | ORAL | Status: DC

## 2013-08-02 NOTE — Telephone Encounter (Signed)
Emailed Tarceva rx to [facility]

## 2013-08-02 NOTE — Telephone Encounter (Deleted)
Error

## 2013-08-05 ENCOUNTER — Ambulatory Visit (HOSPITAL_COMMUNITY): Payer: Medicare Other

## 2013-08-12 MED ORDER — PROCHLORPERAZINE MALEATE 10 MG PO TABS: 10.0000 mg | ORAL_TABLET | Freq: Four times a day (QID) | ORAL | Status: DC | PRN

## 2013-08-12 NOTE — Patient Instructions (Signed)
[facility]   CHEMOTHERAPY INSTRUCTIONS  Tarceva - Rash/diarrhea!! (We need to know if either of these occur). No appetite/not wanting to eat, fatigue, dyspnea, abnormal live function tests, GI bleeding (blood in stools), inflammation of the eyes.   There have been rare reports of serious interstitial lung disease (acute onset of new or progressive pulmonary symptoms (shortness of breath, cough, fever). We need to know if this develops!   POTENTIAL SIDE EFFECTS OF TREATMENT: Increased Susceptibility to Infection, Nausea/Vomiting, Hair Thinning, Changes in Character of Skin and Nails (brittleness, dryness,etc.), Diarrhea and Mouth Sores, RASH   EDUCATIONAL MATERIALS GIVEN AND REVIEWED: Chemotherapy and You Specific Instruction Sheets on: Tarceva/Compazine   SELF CARE ACTIVITIES WHILE ON CHEMOTHERAPY: Increase your fluid intake 48 hours prior to treatment and drink at least 2 quarts per day after treatment., No alcohol intake., No aspirin or other medications unless approved by your oncologist., Eat foods that are light and easy to digest., Eat foods at cold or room temperature., No fried, fatty, or spicy foods immediately before or after treatment., Have teeth cleaned professionally before starting treatment. Keep dentures and partial plates clean., Use soft toothbrush and do not use mouthwashes that contain alcohol. Biotene is a good mouthwash that is available at most pharmacies or may be ordered by calling [phone]., Use warm salt water gargles (1 teaspoon salt per 1 quart warm water) before and after meals and at bedtime. Or you may rinse with 2 tablespoons of three -percent hydrogen peroxide mixed in eight ounces of water., Always use sunscreen with SPF (Sun Protection Factor) of 30 or higher., Use your nausea medication as directed to prevent nausea., Use your stool softener or laxative as directed to prevent constipation. and Use your anti-diarrheal  medication as directed to stop diarrhea.  Please wash your hands for at least 30 seconds using warm soapy water. Handwashing is the #1 way to prevent the spread of germs. Stay away from sick people or people who are getting over a cold. If you develop respiratory systems such as green/yellow mucus production or productive cough or persistent cough let us know and we will see if you need an antibiotic. It is a good idea to keep a pair of gloves on when going into grocery stores/[employer] to decrease your risk of coming into contact with germs on the carts, etc. Carry alcohol hand gel with you at all times and use it frequently if out in public. All foods need to be cooked thoroughly. No raw foods. No medium or undercooked meats, eggs. If your food is cooked medium well, it does not need to be hot pink or saturated with bloody liquid at all. Vegetables and fruits need to be washed/rinsed under the faucet with a dish detergent before being consumed. You can eat raw fruits and vegetables unless we tell you otherwise but it would be best if you cooked them or bought frozen. Do not eat off of salad bars or hot bars unless you really trust the cleanliness of the restaurant. If you need dental work, please let us know before you go for your appointment so that we can coordinate the best possible time for you in regards to your chemo regimen. You need to also let your dentist know that you are actively taking chemo. We may need to do labs prior to your dental appointment. We also want your bowels moving at least every other day. If this is not happening, we need to know so  that we can get you on a bowel regimen to help you go.       MEDICATIONS: You have been given prescriptions for the following medications:  Prochlorperazine/compazine 10mg  tablet. May take 1 tablet every 6 hours if needed for nausea/vomiting.   Over-the-Counter Meds:  Colace - this is a stool softener. Take 100mg  capsule 2-6 times a day as  needed. If you have to take more than 6 capsules of Colace a day call the Cancer Center.  Senna - this is a mild laxative used to treat mild constipation. May take 3 tabs by mouth @ bedtime if needed for constipation.   Milk of Magnesia - this is a laxative used to treat moderate to severe constipation. May take 2-4 tablespoons every 8 hours as needed. May increase to 8 tablespoons x 1 dose and if no bowel movement call the Cancer Center.  Imodium - this is for diarrhea. Take 2 tabs after 1st loose stool and then 1 tab after each loose stool until you go a total of 12 hours without a loose stool. Call Cancer Center if loose stools continue. If it is the weekend or night time, start Imodium (over-the-counter). If it is daytime or during office hours contact us and we will order you a prescription strength anti-diarrheal.    SYMPTOMS TO REPORT AS SOON AS POSSIBLE AFTER TREATMENT:  FEVER GREATER THAN 100.5 F  CHILLS WITH OR WITHOUT FEVER  NAUSEA AND VOMITING THAT IS NOT CONTROLLED WITH YOUR NAUSEA MEDICATION  UNUSUAL SHORTNESS OF BREATH  UNUSUAL BRUISING OR BLEEDING  TENDERNESS IN MOUTH AND THROAT WITH OR WITHOUT PRESENCE OF ULCERS  URINARY PROBLEMS  BOWEL PROBLEMS  UNUSUAL RASH    Wear comfortable clothing and clothing appropriate for easy access to any Portacath or PICC line. Let us know if there is anything that we can do to make your therapy better!      I have been informed and understand all of the instructions given to me and have received a copy. I have been instructed to call the clinic [phone] or my family physician as soon as possible for continued medical care, if indicated. I do not have any more questions at this time but understand that I may call the Cancer Center or the Patient Navigator at [phone] during office hours should I have questions or need assistance in obtaining follow-up care.      _________________________________________       _______________     __________ Signature of Patient or Authorized Representative        Date                            Time      _________________________________________ Nurse's Signature      Erlotinib tablets What is this medicine? ERLOTINIB (er LOE ti nib) is a chemotherapy drug. It targets a specific protein within cancer cells and stops the cancer cells from growing. This medicine is used to treat cancers including non-small cell lung cancer and pancreatic cancer. This medicine may be used for other purposes; ask your health care provider or pharmacist if you have questions. What should I tell my health care provider before I take this medicine? They need to know if you have any of these conditions: -liver disease -lung fibrosis -previous or ongoing radiation therapy -an unusual or allergic reaction to erlotinib, other medicines, foods, dyes, or preservatives -pregnant or trying to get pregnant -breast-feeding  How should I use this medicine? Take erlotinib tablets by mouth. Follow the directions on the prescription label. Take this medicine on an empty stomach, at least 1 hour before or 2 hours after food. Do not take with food. Taking this drug with food may increase your chance of developing side effects. Do not take your medicine more often than directed. Talk to your pediatrician regarding the use of this medicine in children. Special care may be needed. Overdosage: If you think you have taken too much of this medicine contact a poison control center or emergency room at once. NOTE: This medicine is only for you. Do not share this medicine with others. What if I miss a dose? Take your missed dose as soon as you remember. If it is almost time for your next dose, take only that one, and skip your missed dose. Do not take extra or double doses. What may interact with this medicine? -amiodarone -carbamazepine -ciprofloxacin -antiviral medicines for HIV or  AIDS -bosentan -certain medicines for blood pressure -certain medicines for cholesterol like atorvastatin, lovastatin, and simvastatin -clarithromycin -erythromycin -grapefruit juice -medicines for depression -medicines for fungal infections like fluconazole, itraconazole, ketoconazole, or voriconazole -non-steroidal anti-inflammatory drugs (NSAIDs such as ibuprofen or naproxen) -omeprazole -phenobarbital -phenytoin -rifabutin -rifampin -rifapentine -St. John's wort -warfarin This list may not describe all possible interactions. Give your health care provider a list of all the medicines, herbs, non-prescription drugs, or dietary supplements you use. Also tell them if you smoke, drink alcohol, or use illegal drugs. Some items may interact with your medicine. What should I watch for while using this medicine? Visit your doctor for regular check ups. Report any side effects. Continue your course of treatment unless your doctor tells you to stop. You will need blood work done while you are taking this medicine. If you experience any of the following, contact your health care provider: eye irritation; severe or continuing diarrhea, nausea, decreased appetite, or vomiting; or if your breathing gets worse or you develop shortness of breath or cough. If you smoke cigarettes, you should stop smoking. The effectiveness of this drug is reduced by cigarette smoking. If you stop smoking during treatment, be sure to inform your doctor of this change. Do not become pregnant while taking this medicine. Women should inform their doctor if they wish to become pregnant or think they might be pregnant. There is a potential for serious side effects to an unborn child. Talk to your health care professional or pharmacist for more information. Do not breast-feed an infant while taking this medicine. What side effects may I notice from receiving this medicine? Side effects that you should report to your doctor or  health care professional as soon as possible: -allergic reactions like skin rash, itching or hives, swelling of the face, lips, or tongue -bloody or black tarry stools -eye irritation -eye pain -fever -mouth sores -problems related to breathing including shortness of breath or cough -severe or persistent diarrhea, nausea, vomiting, or loss of appetite -spitting up blood or brown material that looks like coffee grounds -unusual bleeding or bruising Side effects that usually do not require medical attention (report to your doctor or health care professional if they continue or are bothersome): -acne -diarrhea -dry skin -itching -loss of appetite -nausea -weak or tired -weight loss This list may not describe all possible side effects. Call your doctor for medical advice about side effects. You may report side effects to FDA at 1-800-FDA-1088. Where should I keep my medicine? Keep  out of the reach of children. Store at room temperature between 15 and 30 degrees C (59 and 86 degrees F). Throw away any unused medicine after the expiration date. NOTE: This sheet is a summary. It may not cover all possible information. If you have questions about this medicine, talk to your doctor, pharmacist, or health care provider.  2013, [employer]. (02/24/2011 11:13:17 AM) Prochlorperazine tablets What is this medicine? PROCHLORPERAZINE (proe klor PER a zeen) helps to control severe nausea and vomiting. This medicine is also used to treat schizophrenia. It can also help patients who experience anxiety that is not due to psychological illness. This medicine may be used for other purposes; ask your health care provider or pharmacist if you have questions. What should I tell my health care provider before I take this medicine? They need to know if you have any of these conditions: -blood disorders or disease -dementia -liver disease or jaundice -Parkinson's disease -uncontrollable movement  disorder -an unusual or allergic reaction to prochlorperazine, other medicines, foods, dyes, or preservatives -pregnant or trying to get pregnant -breast-feeding How should I use this medicine? Take this medicine by mouth with a glass of water. Follow the directions on the prescription label. Take your doses at regular intervals. Do not take your medicine more often than directed. Do not stop taking this medicine suddenly. This can cause nausea, vomiting, and dizziness. Ask your doctor or health care professional for advice. Talk to your pediatrician regarding the use of this medicine in children. Special care may be needed. While this drug may be prescribed for children as young as 2 years for selected conditions, precautions do apply. Overdosage: If you think you have taken too much of this medicine contact a poison control center or emergency room at once. NOTE: This medicine is only for you. Do not share this medicine with others. What if I miss a dose? If you miss a dose, take it as soon as you can. If it is almost time for your next dose, take only that dose. Do not take double or extra doses. What may interact with this medicine? Do not take this medicine with any of the following medications: -amoxapine -antidepressants like citalopram, escitalopram, fluoxetine, paroxetine, and sertraline -deferoxamine -dofetilide -maprotiline -tricyclic antidepressants like amitriptyline, clomipramine, imipramine, nortiptyline and others This medicine may also interact with the following medications: -lithium -medicines for pain -phenytoin -propranolol -warfarin This list may not describe all possible interactions. Give your health care provider a list of all the medicines, herbs, non-prescription drugs, or dietary supplements you use. Also tell them if you smoke, drink alcohol, or use illegal drugs. Some items may interact with your medicine. What should I watch for while using this  medicine? Visit your doctor or health care professional for regular checks on your progress. You may get drowsy or dizzy. Do not drive, use machinery, or do anything that needs mental alertness until you know how this medicine affects you. Do not stand or sit up quickly, especially if you are an older patient. This reduces the risk of dizzy or fainting spells. Alcohol may interfere with the effect of this medicine. Avoid alcoholic drinks. This medicine can reduce the response of your body to heat or cold. Dress warm in cold weather and stay hydrated in hot weather. If possible, avoid extreme temperatures like saunas, hot tubs, very hot or cold showers, or activities that can cause dehydration such as vigorous exercise. This medicine can make you more sensitive to the sun. Keep  out of the sun. If you cannot avoid being in the sun, wear protective clothing and use sunscreen. Do not use sun lamps or tanning beds/booths. Your mouth may get dry. Chewing sugarless gum or sucking hard candy, and drinking plenty of water may help. Contact your doctor if the problem does not go away or is severe. What side effects may I notice from receiving this medicine? Side effects that you should report to your doctor or health care professional as soon as possible: -blurred vision -breast enlargement in men or women -breast milk in women who are not breast-feeding -chest pain, fast or irregular heartbeat -confusion, restlessness -dark yellow or brown urine -difficulty breathing or swallowing -dizziness or fainting spells -drooling, shaking, movement difficulty (shuffling walk) or rigidity -fever, chills, sore throat -involuntary or uncontrollable movements of the eyes, mouth, head, arms, and legs -seizures -stomach area pain -unusually weak or tired -unusual bleeding or bruising -yellowing of skin or eyes Side effects that usually do not require medical attention (report to your doctor or health care  professional if they continue or are bothersome): -difficulty passing urine -difficulty sleeping -headache -sexual dysfunction -skin rash, or itching This list may not describe all possible side effects. Call your doctor for medical advice about side effects. You may report side effects to FDA at 1-800-FDA-1088. Where should I keep my medicine? Keep out of the reach of children. Store at room temperature between 15 and 30 degrees C (59 and 86 degrees F). Protect from light. Throw away any unused medicine after the expiration date. NOTE: This sheet is a summary. It may not cover all possible information. If you have questions about this medicine, talk to your doctor, pharmacist, or health care provider.  2013, [employer]. (03/06/2012 4:59:39 PM)

## 2013-08-13 ENCOUNTER — Ambulatory Visit: Payer: Medicare Other | Admitting: Family Medicine

## 2013-08-13 ENCOUNTER — Telehealth: Payer: Self-pay | Admitting: Family Medicine

## 2013-08-13 NOTE — Telephone Encounter (Signed)
Given that her blood pressure is now doing well I think it is fine to have her followup in a few months. She should get a flu vaccine as long as the cancer centers says that she can.

## 2013-08-13 NOTE — Telephone Encounter (Signed)
Patient cancelled appt today due to upset stomach. She was coming in today because of concerns of high blood pressure and a recheck for this. Daughter states that [facility] checked and BP was down a whole lot. Do you still need to see patient for this recheck?

## 2013-08-20 ENCOUNTER — Encounter (HOSPITAL_COMMUNITY): Payer: Medicare Other | Attending: Oncology

## 2013-08-20 ENCOUNTER — Inpatient Hospital Stay (HOSPITAL_COMMUNITY): Payer: Medicare Other

## 2013-08-20 ENCOUNTER — Other Ambulatory Visit (HOSPITAL_COMMUNITY): Payer: Self-pay | Admitting: *Deleted

## 2013-08-20 DIAGNOSIS — C3492 Malignant neoplasm of unspecified part of left bronchus or lung: Secondary | ICD-10-CM

## 2013-08-20 DIAGNOSIS — C3490 Malignant neoplasm of unspecified part of unspecified bronchus or lung: Secondary | ICD-10-CM | POA: Insufficient documentation

## 2013-08-21 NOTE — Progress Notes (Signed)
Patient brought Tarceva [facility] for [facility]. Pt and daughter surprised that the "pill" she was going to be taking was chemo. Patient doesn't want to take chemo because she states that her breathing isn't "that" bad and that she would rather deal with the shortness of breath at times rather than deal with potential side effects of the Tarceva tablet. Patient scheduled Oct 27 to see Dr. Zigmund Daniel to discuss the pill and the pros vs cons of taking this medication. Patient informed that she should return to discuss this with Dr. Zigmund Daniel. Appt set up so that her daughter could be present for visit also.

## 2013-08-26 ENCOUNTER — Ambulatory Visit (HOSPITAL_COMMUNITY): Payer: Medicare Other

## 2013-08-27 ENCOUNTER — Telehealth: Payer: Self-pay | Admitting: Family Medicine

## 2013-08-27 MED ORDER — AMLODIPINE BESYLATE 5 MG PO TABS: 5.0000 mg | ORAL_TABLET | Freq: Every day | ORAL | Status: DC

## 2013-08-27 NOTE — Telephone Encounter (Signed)
Patient needs a refill of Amlodipine 3 month supply to [facility]. Daughter states that it was talked about in the last visit that patient should just take 5 mg a day which means 2 pills a day, but patient is requesting to just have 5 mg called in instead so it is only 1 pill.

## 2013-08-27 NOTE — Telephone Encounter (Signed)
Rx sent electronically to [facility]. Patient notified.

## 2013-09-09 ENCOUNTER — Ambulatory Visit (HOSPITAL_COMMUNITY): Payer: Medicare Other

## 2013-10-07 ENCOUNTER — Other Ambulatory Visit (HOSPITAL_COMMUNITY): Payer: Medicare Other

## 2013-10-30 ENCOUNTER — Other Ambulatory Visit: Payer: Self-pay | Admitting: Family Medicine

## 2013-11-04 ENCOUNTER — Other Ambulatory Visit (HOSPITAL_COMMUNITY): Payer: Medicare Other

## 2013-11-18 ENCOUNTER — Encounter (HOSPITAL_COMMUNITY): Payer: Medicare Other | Attending: Hematology and Oncology

## 2013-11-18 ENCOUNTER — Encounter (HOSPITAL_COMMUNITY): Payer: Self-pay

## 2013-11-18 VITALS — BP 119/48 | HR 60 | Temp 97.8°F | Resp 16 | Wt 146.1 lb

## 2013-11-18 DIAGNOSIS — J984 Other disorders of lung: Secondary | ICD-10-CM

## 2013-11-18 DIAGNOSIS — C3492 Malignant neoplasm of unspecified part of left bronchus or lung: Secondary | ICD-10-CM

## 2013-11-18 DIAGNOSIS — C3410 Malignant neoplasm of upper lobe, unspecified bronchus or lung: Secondary | ICD-10-CM

## 2013-11-18 NOTE — Progress Notes (Signed)
[facility]  OFFICE PROGRESS NOTE  LUKING,SCOTT, MD [address] [place] [address]  DIAGNOSIS: Adenocarcinoma of left lung, stage 1 - Plan: CBC with Differential, Comprehensive metabolic panel, Lactate dehydrogenase, CT Abdomen Pelvis W Contrast, CT Chest W Contrast  Chief Complaint  Patient presents with  . Lung Cancer    CURRENT THERAPY: watchful expectation  INTERVAL HISTORY: Stephanie [name] 73 y.o. female returns for followup of adenocarcinoma lung, status post left upper lobectomy with mediastinal lymph node dissection for well-differentiated disease, exon 19 deletion positive. She received no postoperative therapy.  It had been suggested that she try Tarceva but after reading the package circular she decided not to. She does have bilateral interstitial disease in the lungs that could be related to her malignancy. She offers no worsening cough or shortness of breath. Appetite is good with persistent dysphasia and right upper extremity weakness. She is able to ambulate. She lives with her unmarried son. She denies any incontinence, seizure activity, dysuria, hematuria, diarrhea, constipation, skin rash, headache, or seizures.  MEDICAL HISTORY: Past Medical History  Diagnosis Date  . CVA (cerebral infarction)     speech difficulities  . Stroke   . Hypercholesteremia     Takes Zocor daily  . Hypertension     takes Atenolol and Lisinopril daily  . Lung mass     left upper lobe  . Arthritis     hands  . TIA (transient ischemic attack)     Sat before thanksgiving;impaired speech  . Pneumothorax   . Lung cancer     INTERIM HISTORY: has CVA (cerebral infarction); HTN (hypertension); Pneumothorax of left lung after biopsy; Adenocarcinoma of left lung, stage 1; Dysarthria as late effect of stroke; Bilateral carotid artery disease; Other and unspecified hyperlipidemia; Lack of coordination; and Muscle weakness (generalized) on  her problem list.   Adenocarcinoma of the lung, status post thoracoscopic left upper lobectomy with mediastinal lymph node dissection for well-differentiated disease, exon19 deletion positive. The patient received no postoperative treatment  ALLERGIES:  has No Known Allergies.  MEDICATIONS: has a current medication list which includes the following prescription(s): amlodipine, aspirin ec, atenolol, citalopram, simvastatin, erlotinib, and prochlorperazine.  SURGICAL HISTORY:  Past Surgical History  Procedure Laterality Date  . No past surgeries    . Lung biopsy  10/13/11    left  . Abdominal hysterectomy  1975  . Varicose vein surgery  21yr ago  . Video assisted thoracoscopy  11/18/2011    Procedure: VIDEO ASSISTED THORACOSCOPY;  Surgeon: SMelrose Nakayama MD;  [facility]  Service: Thoracic;  Laterality: Left;  . Lobectomy  11/18/2011    Procedure: LOBECTOMY;  Surgeon: SMelrose Nakayama MD;  [facility]  Service: Thoracic;  Laterality: Left;  LEFT UPPER LOBECTOMY  . Breast biopsy  early 2000    FAMILY HISTORY: family history includes Cancer in her brother; Diabetes in her brother; Stroke in her brother, mother, and sister. There is no history of Anesthesia problems, Hypotension, Malignant hyperthermia, or Pseudochol deficiency.  SOCIAL HISTORY:  reports that she has never smoked. She has never used smokeless tobacco. She reports that she does not drink alcohol or use illicit drugs.  REVIEW OF SYSTEMS:  Other than that discussed above is noncontributory.  PHYSICAL EXAMINATION: ECOG PERFORMANCE STATUS: 2 - Symptomatic, <50% confined to bed  Blood pressure 119/48, pulse 60, temperature 97.8 F (36.6 C), temperature source Oral, resp. rate 16, weight  146 lb 1.6 oz (66.271 kg).  GENERAL:alert, no distress and comfortable SKIN: skin color, texture, turgor are normal, no rashes or significant lesions EYES: PERLA; Conjunctiva are pink and non-injected, sclera  clear OROPHARYNX:no exudate, no erythema on lips, buccal mucosa, or tongue. NECK: supple, thyroid normal size, non-tender, without nodularity. No masses CHEST: normal AP diameter with no breast masses. LYMPH:  no palpable lymphadenopathy in the cervical, axillary or inguinal LUNGS: clear to auscultation and percussion with normal breathing effort. Scattered expiratory expiratory crackles. No dullness to percussion HEART: regular rate & rhythm and no murmurs. ABDOMEN:abdomen soft, non-tender and normal bowel sounds MUSCULOSKELETAL:no cyanosis of digits and no clubbing. Range of motion normal.  NEURO: alert & oriented x 3 with fluent speech, no focal motor/sensory deficits   LABORATORY DATA: No visits with results within 30 Day(s) from this visit. Latest known visit with results is:  Office Visit on 07/26/2013  Component Date Value Range Status  . WBC 07/26/2013 4.4  4.0 - 10.5 K/uL Final  . RBC 07/26/2013 4.21  3.87 - 5.11 MIL/uL Final  . Hemoglobin 07/26/2013 13.9  12.0 - 15.0 g/dL Final  . HCT 07/26/2013 39.9  36.0 - 46.0 % Final  . MCV 07/26/2013 94.8  78.0 - 100.0 fL Final  . MCH 07/26/2013 33.0  26.0 - 34.0 pg Final  . MCHC 07/26/2013 34.8  30.0 - 36.0 g/dL Final  . RDW 07/26/2013 12.4  11.5 - 15.5 % Final  . Platelets 07/26/2013 177  150 - 400 K/uL Final  . Neutrophils Relative % 07/26/2013 60  43 - 77 % Final  . Neutro Abs 07/26/2013 2.6  1.7 - 7.7 K/uL Final  . Lymphocytes Relative 07/26/2013 26  12 - 46 % Final  . Lymphs Abs 07/26/2013 1.1  0.7 - 4.0 K/uL Final  . Monocytes Relative 07/26/2013 11  3 - 12 % Final  . Monocytes Absolute 07/26/2013 0.5  0.1 - 1.0 K/uL Final  . Eosinophils Relative 07/26/2013 3  0 - 5 % Final  . Eosinophils Absolute 07/26/2013 0.1  0.0 - 0.7 K/uL Final  . Basophils Relative 07/26/2013 1  0 - 1 % Final  . Basophils Absolute 07/26/2013 0.0  0.0 - 0.1 K/uL Final  . Sodium 07/26/2013 140  135 - 145 mEq/L Final  . Potassium 07/26/2013 4.1  3.5 -  5.1 mEq/L Final  . Chloride 07/26/2013 101  96 - 112 mEq/L Final  . CO2 07/26/2013 29  19 - 32 mEq/L Final  . Glucose, Bld 07/26/2013 101* 70 - 99 mg/dL Final  . BUN 07/26/2013 11  6 - 23 mg/dL Final  . Creatinine, Ser 07/26/2013 1.14* 0.50 - 1.10 mg/dL Final  . Calcium 07/26/2013 9.9  8.4 - 10.5 mg/dL Final  . Total Protein 07/26/2013 7.2  6.0 - 8.3 g/dL Final  . Albumin 07/26/2013 3.7  3.5 - 5.2 g/dL Final  . AST 07/26/2013 18  0 - 37 U/L Final  . ALT 07/26/2013 8  0 - 35 U/L Final  . Alkaline Phosphatase 07/26/2013 66  39 - 117 U/L Final  . Total Bilirubin 07/26/2013 0.6  0.3 - 1.2 mg/dL Final  . GFR calc non Af Amer 07/26/2013 47* >90 mL/min Final  . GFR calc Af Amer 07/26/2013 55* >90 mL/min Final   Comment: (NOTE)                          The eGFR has been calculated using the CKD  EPI equation.                          This calculation has not been validated in all clinical situations.                          eGFR's persistently <90 mL/min signify possible Chronic Kidney                          Disease.  Marland Kitchen LDH 07/26/2013 241  94 - 250 U/L Final    PATHOLOGY: Adenocarcinoma, exon 19 deletion  Urinalysis    Component Value Date/Time   COLORURINE YELLOW 11/16/2011 1600   APPEARANCEUR CLEAR 11/16/2011 1600   LABSPEC 1.021 11/16/2011 1600   PHURINE 5.0 11/16/2011 1600   GLUCOSEU NEGATIVE 11/16/2011 1600   HGBUR MODERATE* 11/16/2011 1600   BILIRUBINUR NEGATIVE 11/16/2011 1600   KETONESUR NEGATIVE 11/16/2011 1600   PROTEINUR 30* 11/16/2011 1600   UROBILINOGEN 1.0 11/16/2011 1600   NITRITE NEGATIVE 11/16/2011 1600   LEUKOCYTESUR NEGATIVE 11/16/2011 1600    RADIOGRAPHIC STUDIES: CT Abdomen Pelvis W Contrast Status: Final result         PACS Images    Show images for CT Abdomen Pelvis W Contrast         Study Result    CLINICAL DATA: Restaging lung cancer  EXAM:  CT CHEST, ABDOMEN, AND PELVIS WITH CONTRAST  TECHNIQUE:  Multidetector CT imaging of the chest, abdomen  and pelvis was  performed following the standard protocol during bolus  administration of intravenous contrast.  CONTRAST: 145m OMNIPAQUE IOHEXOL 300 MG/ML SOLN  COMPARISON: CT thorax 01/15/1999 14  FINDINGS:  CT CHEST FINDINGS  No axillary supraclavicular lymphadenopathy. No mediastinal or hilar  lymphadenopathy. No pericardial fluid. Esophagus is normal.  There is volume loss in the left hemi thorax consistent with prior  upper lobe resection. There is a 4 mm pleural-based nodule in the  left lower lobe (image 31) unchanged from prior. There is a  reticular pattern throughout the left lung which is also unchanged.  Within the right upper lobe 6 mm pulmonary nodule (image 28) is  unchanged. Peripheral reticular pattern in the right lung is also  present.  CT ABDOMEN AND PELVIS FINDINGS  No focal hepatic lesion. The gallbladder, pancreas, spleen, adrenal  glands, and kidneys appear normal. Stomach, small bowel, and cecum  normal. There is a moderate volume stool throughout the colon. No  obstructing lesion present. There are multiple diverticular of the  sigmoid region.  Abdominal aorta is normal in caliber. No retroperitoneal or  periportal lymphadenopathy. No peritoneal disease.  No free fluid the pelvis. The bladder is normal. Post hysterectomy  anatomy. No pelvic lymphadenopathy. Insert negative then  IMPRESSION:  CT CHEST IMPRESSION  1. Stable postoperative and post therapy change in the left hemi  thorax.  2. No evidence of lung cancer recurrence.  3. Stable chronic interstitial lung disease.  CT ABDOMEN AND PELVIS IMPRESSION  No metastasis in the abdomen or pelvis.  Electronically Signed  By: SSuzy BouchardM.D.  On: 07/31/2013 16:36     ASSESSMENT:  #1.Stage I B. adenocarcinoma of the left upper lobe, status post thoracoscopic left upper lobectomy and mediastinal lymph node dissection, exon 19 deletion positive.  #2. 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  #3. 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  #4. Hypertension, controlled  PLAN:  #1. In the presence of her daughter, the patient was told to call should she develop any worsening symptoms particularly chest pain or worsening shortness of breath or headache. #2.CT scan with office visit in 3 months along with CBC, chem profile, and LDH.   All questions were answered. The patient knows to call the clinic with any problems, questions or concerns. We can certainly see the patient much sooner if necessary.   I spent 25 minutes counseling the patient face to face. The total time spent in the appointment was 30 minutes.    Doroteo Bradford, MD 11/18/2013 4:19 PM

## 2013-11-18 NOTE — Patient Instructions (Signed)
[facility] Discharge Instructions  RECOMMENDATIONS MADE BY THE CONSULTANT AND ANY TEST RESULTS WILL BE SENT TO YOUR REFERRING PHYSICIAN.  EXAM FINDINGS BY THE PHYSICIAN TODAY AND SIGNS OR SYMPTOMS TO REPORT TO CLINIC OR PRIMARY PHYSICIAN:   Return in 3 months for labs (CBC diff, CMET, LDH): February 10, 2013 @ 8:30  Return in 3 months for CT scan  Return after CT scan for MD visit: February 17, 2014 @ 3:30   Thank you for choosing [facility] to provide your oncology and hematology care.  To afford each patient quality time with our providers, please arrive at least 15 minutes before your scheduled appointment time.  With your help, our goal is to use those 15 minutes to complete the necessary work-up to ensure our physicians have the information they need to help with your evaluation and healthcare recommendations.    Effective January 1st, 2014, we ask that you re-schedule your appointment with our physicians should you arrive 10 or more minutes late for your appointment.  We strive to give you quality time with our providers, and arriving late affects you and other patients whose appointments are after yours.    Again, thank you for choosing [facility].  Our hope is that these requests will decrease the amount of time that you wait before being seen by our physicians.       _____________________________________________________________  Should you have questions after your visit to [facility], please contact our office at [phone] between the hours of 8:30 a.m. and 5:00 p.m.  Voicemails left after 4:30 p.m. will not be returned until the following business day.  For prescription refill requests, have your pharmacy contact our office with your prescription refill request.

## 2014-01-07 ENCOUNTER — Other Ambulatory Visit: Payer: Self-pay | Admitting: Family Medicine

## 2014-01-22 ENCOUNTER — Encounter: Payer: Self-pay | Admitting: Family Medicine

## 2014-01-22 ENCOUNTER — Ambulatory Visit (INDEPENDENT_AMBULATORY_CARE_PROVIDER_SITE_OTHER): Payer: Medicare Other | Admitting: Family Medicine

## 2014-01-22 VITALS — BP 122/80 | Ht 67.0 in | Wt 145.8 lb

## 2014-01-22 DIAGNOSIS — R413 Other amnesia: Secondary | ICD-10-CM

## 2014-01-22 DIAGNOSIS — I6350 Cerebral infarction due to unspecified occlusion or stenosis of unspecified cerebral artery: Secondary | ICD-10-CM

## 2014-01-22 DIAGNOSIS — I639 Cerebral infarction, unspecified: Secondary | ICD-10-CM

## 2014-01-22 MED ORDER — ATENOLOL 50 MG PO TABS
50.0000 mg | ORAL_TABLET | ORAL | Status: DC
Start: 2014-01-22 — End: 2014-05-20

## 2014-01-22 MED ORDER — SIMVASTATIN 20 MG PO TABS: ORAL_TABLET | ORAL | Status: DC

## 2014-01-22 MED ORDER — CITALOPRAM HYDROBROMIDE 10 MG PO TABS: ORAL_TABLET | ORAL | Status: DC
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
## 2014-01-24 ENCOUNTER — Telehealth: Payer: Self-pay | Admitting: *Deleted

## 2014-01-24 NOTE — Telephone Encounter (Signed)
Called kim (daughter) to give her number for [employer]. Number is [phone]. Daughter notified.

## 2014-02-07 ENCOUNTER — Other Ambulatory Visit: Payer: Self-pay

## 2014-02-07 ENCOUNTER — Emergency Department (HOSPITAL_COMMUNITY)
Admission: EM | Admit: 2014-02-07 | Discharge: 2014-02-07 | Disposition: A | Discharge status: Home / Self Care | Payer: Medicare Other | Attending: Emergency Medicine | Admitting: Emergency Medicine

## 2014-02-07 ENCOUNTER — Emergency Department (HOSPITAL_COMMUNITY): Payer: Medicare Other

## 2014-02-07 ENCOUNTER — Encounter (HOSPITAL_COMMUNITY): Payer: Self-pay | Admitting: Emergency Medicine

## 2014-02-07 DIAGNOSIS — M19049 Primary osteoarthritis, unspecified hand: Secondary | ICD-10-CM | POA: Insufficient documentation

## 2014-02-07 DIAGNOSIS — R0600 Dyspnea, unspecified: Secondary | ICD-10-CM

## 2014-02-07 DIAGNOSIS — R0989 Other specified symptoms and signs involving the circulatory and respiratory systems: Principal | ICD-10-CM | POA: Insufficient documentation

## 2014-02-07 DIAGNOSIS — Z7982 Long term (current) use of aspirin: Secondary | ICD-10-CM | POA: Insufficient documentation

## 2014-02-07 DIAGNOSIS — E7800 Pure hypercholesterolemia, unspecified: Secondary | ICD-10-CM | POA: Insufficient documentation

## 2014-02-07 DIAGNOSIS — Z79899 Other long term (current) drug therapy: Secondary | ICD-10-CM | POA: Insufficient documentation

## 2014-02-07 DIAGNOSIS — Z85118 Personal history of other malignant neoplasm of bronchus and lung: Secondary | ICD-10-CM | POA: Insufficient documentation

## 2014-02-07 DIAGNOSIS — I10 Essential (primary) hypertension: Secondary | ICD-10-CM | POA: Insufficient documentation

## 2014-02-07 DIAGNOSIS — R0609 Other forms of dyspnea: Secondary | ICD-10-CM | POA: Insufficient documentation

## 2014-02-07 DIAGNOSIS — R062 Wheezing: Secondary | ICD-10-CM | POA: Insufficient documentation

## 2014-02-07 DIAGNOSIS — Z8709 Personal history of other diseases of the respiratory system: Secondary | ICD-10-CM | POA: Insufficient documentation

## 2014-02-07 DIAGNOSIS — Z8673 Personal history of transient ischemic attack (TIA), and cerebral infarction without residual deficits: Secondary | ICD-10-CM | POA: Insufficient documentation

## 2014-02-07 LAB — CBC WITH DIFFERENTIAL/PLATELET
Basophils Absolute: 0 10*3/uL (ref 0.0–0.1)
Basophils Relative: 1 % (ref 0–1)
Eosinophils Absolute: 0.1 10*3/uL (ref 0.0–0.7)
Eosinophils Relative: 3 % (ref 0–5)
HCT: 37.9 % (ref 36.0–46.0)
Hemoglobin: 13.3 g/dL (ref 12.0–15.0)
Lymphocytes Relative: 23 % (ref 12–46)
Lymphs Abs: 1.1 10*3/uL (ref 0.7–4.0)
MCH: 33.5 pg (ref 26.0–34.0)
MCHC: 35.1 g/dL (ref 30.0–36.0)
MCV: 95.5 fL (ref 78.0–100.0)
Monocytes Absolute: 0.5 10*3/uL (ref 0.1–1.0)
Monocytes Relative: 11 % (ref 3–12)
Neutro Abs: 2.9 10*3/uL (ref 1.7–7.7)
Neutrophils Relative %: 63 % (ref 43–77)
Platelets: 150 10*3/uL (ref 150–400)
RBC: 3.97 MIL/uL (ref 3.87–5.11)
RDW: 12.4 % (ref 11.5–15.5)
WBC: 4.6 10*3/uL (ref 4.0–10.5)

## 2014-02-07 LAB — COMPREHENSIVE METABOLIC PANEL
ALT: 8 U/L (ref 0–35)
AST: 20 U/L (ref 0–37)
Albumin: 3.7 g/dL (ref 3.5–5.2)
Alkaline Phosphatase: 71 U/L (ref 39–117)
BUN: 14 mg/dL (ref 6–23)
CO2: 28 mEq/L (ref 19–32)
Calcium: 9.4 mg/dL (ref 8.4–10.5)
Chloride: 101 mEq/L (ref 96–112)
Creatinine, Ser: 1.14 mg/dL — ABNORMAL HIGH (ref 0.50–1.10)
GFR calc Af Amer: 54 mL/min — ABNORMAL LOW (ref >90)
GFR calc non Af Amer: 47 mL/min — ABNORMAL LOW (ref >90)
Glucose, Bld: 97 mg/dL (ref 70–99)
Potassium: 4.4 mEq/L (ref 3.7–5.3)
Sodium: 139 mEq/L (ref 137–147)
Total Bilirubin: 0.6 mg/dL (ref 0.3–1.2)
Total Protein: 7.5 g/dL (ref 6.0–8.3)

## 2014-02-07 LAB — TROPONIN I: Troponin I: <0.3 ng/mL (ref <0.30)

## 2014-02-07 NOTE — Discharge Instructions (Signed)

## 2014-02-07 NOTE — ED Notes (Signed)
Sudden onset sob, No chest pain, No cough or colds  No swelling  Of lower ext

## 2014-02-07 NOTE — ED Provider Notes (Signed)
CSN: [id number]     Arrival date & time 02/07/14  1714 History  This chart was scribed for Virgel Manifold, MD by Roe Coombs, ED Scribe. The patient was seen in room APA05/APA05. Patient's care was started at 8:07 PM.  Chief Complaint  Patient presents with  . Shortness of Breath    Patient is a 73 y.o. female presenting with shortness of breath. The history is provided by the patient. No language interpreter was used.  Shortness of Breath Severity:  Unable to specify Onset quality:  Sudden Duration:  10 minutes Timing:  Constant Progression:  Resolved Chronicity:  New Associated symptoms: no chest pain and no cough   Risk factors: hx of cancer   Risk factors: no hx of PE/DVT     HPI Comments: Stephanie [name] is a 73 y.o. female with history of CVA and TIA, HTN who presents to the Emergency Department complaining of sudden onset shortness of breath that began 5 hours ago and lasted about 10 minutes. Patient describes this as being unable to catch a good breath. Patient states that she was not exerting herself when shortness of breath began. She has had episodes like this before. She denies any difficulty breathing at this time. Her family member, who is bedside, suspects that this may be due to anxiety and says she has a history of this. She denies chest pain, throat tightness, cough, lower extremity swelling. Patient also has a history of left upper lung mass - she is not undergoing active treatment but follows up regularly here at the [facility]. She has no history of DVT/PE.  PCP - Wolfgang Phoenix  Past Medical History  Diagnosis Date  . CVA (cerebral infarction)     speech difficulities  . Stroke   . Hypercholesteremia     Takes Zocor daily  . Hypertension     takes Atenolol and Lisinopril daily  . Lung mass     left upper lobe  . Arthritis     hands  . TIA (transient ischemic attack)     Sat before thanksgiving;impaired speech  . Pneumothorax   . Lung cancer    Past Surgical  History  Procedure Laterality Date  . No past surgeries    . Lung biopsy  10/13/11    left  . Abdominal hysterectomy  1975  . Varicose vein surgery  80yrs ago  . Video assisted thoracoscopy  11/18/2011    Procedure: VIDEO ASSISTED THORACOSCOPY;  Surgeon: Melrose Nakayama, MD;  Location: [facility];  Service: Thoracic;  Laterality: Left;  . Lobectomy  11/18/2011    Procedure: LOBECTOMY;  Surgeon: Melrose Nakayama, MD;  Location: [facility];  Service: Thoracic;  Laterality: Left;  LEFT UPPER LOBECTOMY  . Breast biopsy  early 2000   Family History  Problem Relation Age of Onset  . Stroke Mother   . Stroke Sister   . Anesthesia problems Neg Hx   . Hypotension Neg Hx   . Malignant hyperthermia Neg Hx   . Pseudochol deficiency Neg Hx   . Diabetes Brother   . Cancer Brother   . Stroke Brother    History  Substance Use Topics  . Smoking status: Never Smoker   . Smokeless tobacco: Never Used  . Alcohol Use: No   OB History   Grav Para Term Preterm Abortions TAB SAB Ect Mult Living                 Review of Systems  HENT:  Negative for throat tightness.  Respiratory: Positive for shortness of breath. Negative for cough.   Cardiovascular: Negative for chest pain and leg swelling.  All other systems reviewed and are negative.     Allergies  Review of patient's allergies indicates no known allergies.  Home Medications   Current Outpatient Rx  Name  Route  Sig  Dispense  Refill  . amLODipine (NORVASC) 5 MG tablet   Oral   Take 5 mg by mouth daily.         . citalopram (CELEXA) 10 MG tablet   Oral   Take 10 mg by mouth daily.         . simvastatin (ZOCOR) 20 MG tablet   Oral   Take 20 mg by mouth daily.         Marland Kitchen aspirin EC 81 MG tablet   Oral   Take 81 mg by mouth every morning.         Marland Kitchen atenolol (TENORMIN) 50 MG tablet   Oral   Take 1 tablet (50 mg total) by mouth 1 day or 1 dose.   90 tablet   2   . prochlorperazine (COMPAZINE) 10 MG tablet    Oral   Take 1 tablet (10 mg total) by mouth every 6 (six) hours as needed (nausea/vomiting).   30 tablet   3    Triage Vitals: BP 140/51  Pulse 56  Temp(Src) 97.9 F (36.6 C) (Oral)  Resp 16  Ht 5\' 7"  (1.702 m)  Wt 140 lb (63.504 kg)  BMI 21.92 kg/m2  SpO2 100% Physical Exam  Nursing note and vitals reviewed. Constitutional: She appears well-developed and well-nourished. No distress.  HENT:  Head: Normocephalic and atraumatic.  Eyes: Conjunctivae are normal. Right eye exhibits no discharge. Left eye exhibits no discharge.  Neck: Neck supple.  Cardiovascular: Normal rate, regular rhythm and normal heart sounds.  Exam reveals no gallop and no friction rub.   No murmur heard. Pulmonary/Chest: Effort normal. No respiratory distress. She has wheezes. She has no rales.  Scattered faint wheezing.  Abdominal: Soft. She exhibits no distension. There is no tenderness.  Musculoskeletal: She exhibits no edema and no tenderness.  No lower extremity edema. No calf tenderness.  Neurological: She is alert.  Skin: Skin is warm and dry.  Psychiatric: She has a normal mood and affect. Her behavior is normal. Thought content normal.    ED Course  Procedures (including critical care time) DIAGNOSTIC STUDIES: Oxygen Saturation is 100% on room air, normal by my interpretation.    COORDINATION OF CARE: 8:14 PM- Labs, EKG, CXR are reassuring. She is not currently complaining of difficulty breathing and I feel she is stable for discharge. Patient informed of current plan for treatment and evaluation and agrees with plan at this time.     Labs Review Labs Reviewed  COMPREHENSIVE METABOLIC PANEL - Abnormal; Notable for the following:    Creatinine, Ser 1.14 (*)    GFR calc non Af Amer 47 (*)    GFR calc Af Amer 54 (*)    All other components within normal limits  CBC WITH DIFFERENTIAL  TROPONIN I   Imaging Review Dg Chest 2 View  02/07/2014   CLINICAL DATA:  Dyspnea  EXAM: CHEST  2 VIEW   COMPARISON:  CT CHEST W/CM dated 07/31/2013; DG RIBS UNILATERAL*R* dated 06/11/2013; DG CHEST 2 VIEW dated 06/11/2013  FINDINGS: The lungs are adequately inflated. There is chronic obscuration of the left heart border which may  reflect chronic lingular atelectasis or may merely be related to the diffuse pulmonary fibrosis that is present bilaterally. The cardiopericardial silhouette does not appear enlarged. The pulmonary vascularity is not engorged. There is apical pleural thickening bilaterally. The bony thorax is osteopenic. There is stable wedge compression of the body of T12 or L1 with loss of height of approximately 20%.  IMPRESSION: There are extensive chronic changes within both lungs consistent with pulmonary fibrosis. There is no definite evidence of pneumonia nor CHF.   Electronically Signed   By: David  Martinique   On: 02/07/2014 17:52     EKG Interpretation   Date/Time:  Friday February 07 2014 17:28:42 EDT Ventricular Rate:  56 PR Interval:  180 QRS Duration: 78 QT Interval:  482 QTC Calculation: 465 R Axis:     Text Interpretation:  Sinus bradycardia Otherwise normal ECG When compared  with ECG of 17-Dec-2012 22:41, Questionable change in QRS axis T wave  inversion now evident in Anterior leads ED PHYSICIAN INTERPRETATION  AVAILABLE IN [facility] Confirmed by TEST, Record ([id number]) on  02/09/2014 1:14:11 PM      MDM   Final diagnoses:  Dyspnea    74 year old female with dyspnea. Patient with underlying pulmonary fibrosis and she has some faint scattered wheezing on exam. She's otherwise clear. Chest no increased work of breathing. Oxygen saturations are very good. Very low suspicion for serious infectious process. Chest x-ray showed no acute abnormality. No leukocytosis. Troponin is normal. Atypical symptoms for ACS. Doubt pulmonary embolism or dissection. The patient has been medically screened at this point. I feel she is appropriate for discharge. Return precautions were  discussed. Outpatient followup otherwise.  I personally preformed the services scribed in my presence. The recorded information has been reviewed is accurate. Virgel Manifold, MD.    Virgel Manifold, MD 02/13/14 1310

## 2014-02-10 ENCOUNTER — Encounter (HOSPITAL_COMMUNITY): Payer: Medicare Other | Attending: Hematology and Oncology

## 2014-02-10 ENCOUNTER — Ambulatory Visit (HOSPITAL_COMMUNITY): Payer: Medicare Other

## 2014-02-10 DIAGNOSIS — C3410 Malignant neoplasm of upper lobe, unspecified bronchus or lung: Secondary | ICD-10-CM

## 2014-02-10 DIAGNOSIS — C3490 Malignant neoplasm of unspecified part of unspecified bronchus or lung: Secondary | ICD-10-CM | POA: Insufficient documentation

## 2014-02-10 DIAGNOSIS — C3492 Malignant neoplasm of unspecified part of left bronchus or lung: Secondary | ICD-10-CM

## 2014-02-10 LAB — CBC WITH DIFFERENTIAL/PLATELET
BASOS ABS: 0 10*3/uL (ref 0.0–0.1)
BASOS PCT: 0 % (ref 0–1)
EOS ABS: 0.1 10*3/uL (ref 0.0–0.7)
Eosinophils Relative: 2 % (ref 0–5)
HEMATOCRIT: 37.8 % (ref 36.0–46.0)
Hemoglobin: 12.9 g/dL (ref 12.0–15.0)
Lymphocytes Relative: 18 % (ref 12–46)
Lymphs Abs: 1 10*3/uL (ref 0.7–4.0)
MCH: 33.1 pg (ref 26.0–34.0)
MCHC: 34.1 g/dL (ref 30.0–36.0)
MCV: 96.9 fL (ref 78.0–100.0)
Monocytes Absolute: 0.5 10*3/uL (ref 0.1–1.0)
Monocytes Relative: 9 % (ref 3–12)
Neutro Abs: 3.9 10*3/uL (ref 1.7–7.7)
Neutrophils Relative %: 71 % (ref 43–77)
PLATELETS: 150 10*3/uL (ref 150–400)
RBC: 3.9 MIL/uL (ref 3.87–5.11)
RDW: 12.5 % (ref 11.5–15.5)
WBC: 5.4 10*3/uL (ref 4.0–10.5)

## 2014-02-10 LAB — COMPREHENSIVE METABOLIC PANEL
ALT: 7 U/L (ref 0–35)
AST: 19 U/L (ref 0–37)
Albumin: 3.5 g/dL (ref 3.5–5.2)
Alkaline Phosphatase: 72 U/L (ref 39–117)
BUN: 13 mg/dL (ref 6–23)
CALCIUM: 9.6 mg/dL (ref 8.4–10.5)
CO2: 30 meq/L (ref 19–32)
Chloride: 103 mEq/L (ref 96–112)
Creatinine, Ser: 1.25 mg/dL — ABNORMAL HIGH (ref 0.50–1.10)
GFR calc Af Amer: 49 mL/min — ABNORMAL LOW (ref >90)
GFR calc non Af Amer: 42 mL/min — ABNORMAL LOW (ref >90)
Glucose, Bld: 109 mg/dL — ABNORMAL HIGH (ref 70–99)
Potassium: 4.6 mEq/L (ref 3.7–5.3)
SODIUM: 142 meq/L (ref 137–147)
TOTAL PROTEIN: 7.2 g/dL (ref 6.0–8.3)
Total Bilirubin: 0.6 mg/dL (ref 0.3–1.2)

## 2014-02-10 LAB — LACTATE DEHYDROGENASE: LDH: 228 U/L (ref 94–250)

## 2014-02-10 NOTE — Progress Notes (Signed)
Labs drawn today for cbc/diff,cmp,ldh

## 2014-02-12 ENCOUNTER — Ambulatory Visit (HOSPITAL_COMMUNITY): Payer: Medicare Other

## 2014-02-12 ENCOUNTER — Encounter (HOSPITAL_COMMUNITY): Payer: Self-pay

## 2014-02-12 ENCOUNTER — Ambulatory Visit (HOSPITAL_COMMUNITY)
Admission: RE | Admit: 2014-02-12 | Discharge: 2014-02-12 | Disposition: A | Discharge status: Home / Self Care | Payer: Medicare Other | Source: Ambulatory Visit | Attending: Hematology and Oncology | Admitting: Hematology and Oncology

## 2014-02-12 DIAGNOSIS — C3490 Malignant neoplasm of unspecified part of unspecified bronchus or lung: Secondary | ICD-10-CM | POA: Insufficient documentation

## 2014-02-12 DIAGNOSIS — K5730 Diverticulosis of large intestine without perforation or abscess without bleeding: Secondary | ICD-10-CM | POA: Insufficient documentation

## 2014-02-12 DIAGNOSIS — I2510 Atherosclerotic heart disease of native coronary artery without angina pectoris: Secondary | ICD-10-CM | POA: Insufficient documentation

## 2014-02-12 DIAGNOSIS — C3492 Malignant neoplasm of unspecified part of left bronchus or lung: Secondary | ICD-10-CM

## 2014-02-12 DIAGNOSIS — R918 Other nonspecific abnormal finding of lung field: Secondary | ICD-10-CM | POA: Insufficient documentation

## 2014-02-12 DIAGNOSIS — J8410 Pulmonary fibrosis, unspecified: Secondary | ICD-10-CM | POA: Insufficient documentation

## 2014-02-12 MED ORDER — IOHEXOL 300 MG/ML  SOLN
80.0000 mL | Freq: Once | INTRAMUSCULAR | Status: AC | PRN
  Administered 2014-02-12: 80 mL via INTRAVENOUS

## 2014-02-17 ENCOUNTER — Encounter (HOSPITAL_BASED_OUTPATIENT_CLINIC_OR_DEPARTMENT_OTHER): Payer: Medicare Other

## 2014-02-17 ENCOUNTER — Encounter (HOSPITAL_COMMUNITY): Payer: Self-pay

## 2014-02-17 VITALS — BP 133/72 | HR 54 | Temp 98.1°F | Resp 18 | Wt 144.9 lb

## 2014-02-17 DIAGNOSIS — I10 Essential (primary) hypertension: Secondary | ICD-10-CM

## 2014-02-17 DIAGNOSIS — C3490 Malignant neoplasm of unspecified part of unspecified bronchus or lung: Secondary | ICD-10-CM

## 2014-02-17 DIAGNOSIS — C3410 Malignant neoplasm of upper lobe, unspecified bronchus or lung: Secondary | ICD-10-CM

## 2014-02-17 NOTE — Progress Notes (Signed)
[facility]  OFFICE PROGRESS [name], MD [address] [place] [address]  DIAGNOSIS: No diagnosis found.  Chief Complaint  Patient presents with  . Lung cancer, EGFR mutated    CURRENT THERAPY: Watchful expectation and surveillance  INTERVAL HISTORY: Stephanie [name] 73 y.o. female returns for followup of adenocarcinoma of the lung, status post thoracoscopic left upper lobectomy and mediastinal lymph node dissection on 11/18/2011 for well-differentiated disease, exon 19 deletion positive, having received no postoperative therapy. It was recommended she try Tarceva because of changes on her chest CT scan but after reading the package insert, she decided not to take the medication. Justification for treatment or primarily rest upon symptomatology. She is status post CVA with speech difficulties. She was seen in the emergency room 2 weeks ago increasing shortness of breath. Chest x-ray failed to reveal pneumonia or pleural effusion. She has allergies at this time of year but does not take any medication for it. Right-sided weakness has stabilized. She denies any headache, fever, night sweats, abdominal pain, diarrhea, constipation, dysuria, hematuria, incontinence, lower extremity swelling or redness, skin rash, headache, or seizures.  MEDICAL HISTORY: Past Medical History  Diagnosis Date  . CVA (cerebral infarction)     speech difficulities  . Stroke   . Hypercholesteremia     Takes Zocor daily  . Hypertension     takes Atenolol and Lisinopril daily  . Lung mass     left upper lobe  . Arthritis     hands  . TIA (transient ischemic attack)     Sat before thanksgiving;impaired speech  . Pneumothorax   . Lung cancer     INTERIM HISTORY: has CVA (cerebral infarction); HTN (hypertension); Pneumothorax of left lung after biopsy; Adenocarcinoma of left lung, stage 1; Dysarthria as late effect of stroke; Bilateral carotid  artery disease; Other and unspecified hyperlipidemia; Lack of coordination; and Muscle weakness (generalized) on her problem list.    ALLERGIES:  has No Known Allergies.  MEDICATIONS: has a current medication list which includes the following prescription(s): amlodipine, aspirin ec, atenolol, citalopram, prochlorperazine, and simvastatin.  SURGICAL HISTORY:  Past Surgical History  Procedure Laterality Date  . No past surgeries    . Lung biopsy  10/13/11    left  . Abdominal hysterectomy  1975  . Varicose vein surgery  65yr ago  . Video assisted thoracoscopy  11/18/2011    Procedure: VIDEO ASSISTED THORACOSCOPY;  Surgeon: SMelrose Nakayama MD;  Location: [facility]  Service: Thoracic;  Laterality: Left;  . Lobectomy  11/18/2011    Procedure: LOBECTOMY;  Surgeon: SMelrose Nakayama MD;  Location[facility]  Service: Thoracic;  Laterality: Left;  LEFT UPPER LOBECTOMY  . Breast biopsy  early 2000    FAMILY HISTORY: family history includes Cancer in her brother; Diabetes in her brother; Stroke in her brother, mother, and sister. There is no history of Anesthesia problems, Hypotension, Malignant hyperthermia, or Pseudochol deficiency.  SOCIAL HISTORY:  reports that she has never smoked. She has never used smokeless tobacco. She reports that she does not drink alcohol or use illicit drugs.  REVIEW OF SYSTEMS:  Other than that discussed above is noncontributory.  PHYSICAL EXAMINATION: ECOG PERFORMANCE STATUS: 2 - Symptomatic, <50% confined to bed  There were no vitals taken for this visit.  GENERAL:alert, no distress and comfortable SKIN: skin color, texture, turgor are normal, no rashes or significant lesions EYES: PERLA; Conjunctiva  are pink and non-injected, sclera clear SINUSES: No redness or tenderness over maxillary or ethmoid sinuses OROPHARYNX:no exudate, no erythema on lips, buccal mucosa, or tongue. NECK: supple, thyroid normal size, non-tender, without nodularity. No  masses CHEST: Slight kyphosis with increased AP diameter. LYMPH:  no palpable lymphadenopathy in the cervical, axillary or inguinal LUNGS: Scattered respiratory respiratory crackles bilaterally with no dullness to percussion. HEART: regular rate & rhythm and no murmurs. ABDOMEN:abdomen soft, non-tender and normal bowel sounds MUSCULOSKELETAL:no cyanosis of digits and no clubbing. Range of motion normal.  NEURO: alert & oriented x 3 with fluent speech, no focal motor/sensory deficits. Right hemiparesis 3/5.   LABORATORY DATA: Infusion on 02/10/2014  Component Date Value Ref Range Status  . WBC 02/10/2014 5.4  4.0 - 10.5 K/uL Final  . RBC 02/10/2014 3.90  3.87 - 5.11 MIL/uL Final  . Hemoglobin 02/10/2014 12.9  12.0 - 15.0 g/dL Final  . HCT 02/10/2014 37.8  36.0 - 46.0 % Final  . MCV 02/10/2014 96.9  78.0 - 100.0 fL Final  . MCH 02/10/2014 33.1  26.0 - 34.0 pg Final  . MCHC 02/10/2014 34.1  30.0 - 36.0 g/dL Final  . RDW 02/10/2014 12.5  11.5 - 15.5 % Final  . Platelets 02/10/2014 150  150 - 400 K/uL Final  . Neutrophils Relative % 02/10/2014 71  43 - 77 % Final  . Neutro Abs 02/10/2014 3.9  1.7 - 7.7 K/uL Final  . Lymphocytes Relative 02/10/2014 18  12 - 46 % Final  . Lymphs Abs 02/10/2014 1.0  0.7 - 4.0 K/uL Final  . Monocytes Relative 02/10/2014 9  3 - 12 % Final  . Monocytes Absolute 02/10/2014 0.5  0.1 - 1.0 K/uL Final  . Eosinophils Relative 02/10/2014 2  0 - 5 % Final  . Eosinophils Absolute 02/10/2014 0.1  0.0 - 0.7 K/uL Final  . Basophils Relative 02/10/2014 0  0 - 1 % Final  . Basophils Absolute 02/10/2014 0.0  0.0 - 0.1 K/uL Final  . Sodium 02/10/2014 142  137 - 147 mEq/L Final  . Potassium 02/10/2014 4.6  3.7 - 5.3 mEq/L Final  . Chloride 02/10/2014 103  96 - 112 mEq/L Final  . CO2 02/10/2014 30  19 - 32 mEq/L Final  . Glucose, Bld 02/10/2014 109* 70 - 99 mg/dL Final  . BUN 02/10/2014 13  6 - 23 mg/dL Final  . Creatinine, Ser 02/10/2014 1.25* 0.50 - 1.10 mg/dL Final  .  Calcium 02/10/2014 9.6  8.4 - 10.5 mg/dL Final  . Total Protein 02/10/2014 7.2  6.0 - 8.3 g/dL Final  . Albumin 02/10/2014 3.5  3.5 - 5.2 g/dL Final  . AST 02/10/2014 19  0 - 37 U/L Final  . ALT 02/10/2014 7  0 - 35 U/L Final  . Alkaline Phosphatase 02/10/2014 72  39 - 117 U/L Final  . Total Bilirubin 02/10/2014 0.6  0.3 - 1.2 mg/dL Final  . GFR calc non Af Amer 02/10/2014 42* >90 mL/min Final  . GFR calc Af Amer 02/10/2014 49* >90 mL/min Final   Comment: (NOTE)                          The eGFR has been calculated using the CKD EPI equation.                          This calculation has not been validated in all clinical situations.  eGFR's persistently <90 mL/min signify possible Chronic Kidney                          Disease.  Marland Kitchen LDH 02/10/2014 228  94 - 250 U/L Final  Admission on 02/07/2014, Discharged on 02/07/2014  Component Date Value Ref Range Status  . WBC 02/07/2014 4.6  4.0 - 10.5 K/uL Final  . RBC 02/07/2014 3.97  3.87 - 5.11 MIL/uL Final  . Hemoglobin 02/07/2014 13.3  12.0 - 15.0 g/dL Final  . HCT 02/07/2014 37.9  36.0 - 46.0 % Final  . MCV 02/07/2014 95.5  78.0 - 100.0 fL Final  . MCH 02/07/2014 33.5  26.0 - 34.0 pg Final  . MCHC 02/07/2014 35.1  30.0 - 36.0 g/dL Final  . RDW 02/07/2014 12.4  11.5 - 15.5 % Final  . Platelets 02/07/2014 150  150 - 400 K/uL Final  . Neutrophils Relative % 02/07/2014 63  43 - 77 % Final  . Neutro Abs 02/07/2014 2.9  1.7 - 7.7 K/uL Final  . Lymphocytes Relative 02/07/2014 23  12 - 46 % Final  . Lymphs Abs 02/07/2014 1.1  0.7 - 4.0 K/uL Final  . Monocytes Relative 02/07/2014 11  3 - 12 % Final  . Monocytes Absolute 02/07/2014 0.5  0.1 - 1.0 K/uL Final  . Eosinophils Relative 02/07/2014 3  0 - 5 % Final  . Eosinophils Absolute 02/07/2014 0.1  0.0 - 0.7 K/uL Final  . Basophils Relative 02/07/2014 1  0 - 1 % Final  . Basophils Absolute 02/07/2014 0.0  0.0 - 0.1 K/uL Final  . Troponin I 02/07/2014 <0.30  <0.30 ng/mL  Final   Comment:                                 Due to the release kinetics of cTnI,                          a negative result within the first hours                          of the onset of symptoms does not rule out                          myocardial infarction with certainty.                          If myocardial infarction is still suspected,                          repeat the test at appropriate intervals.  . Sodium 02/07/2014 139  137 - 147 mEq/L Final  . Potassium 02/07/2014 4.4  3.7 - 5.3 mEq/L Final  . Chloride 02/07/2014 101  96 - 112 mEq/L Final  . CO2 02/07/2014 28  19 - 32 mEq/L Final  . Glucose, Bld 02/07/2014 97  70 - 99 mg/dL Final  . BUN 02/07/2014 14  6 - 23 mg/dL Final  . Creatinine, Ser 02/07/2014 1.14* 0.50 - 1.10 mg/dL Final  . Calcium 02/07/2014 9.4  8.4 - 10.5 mg/dL Final  . Total Protein 02/07/2014 7.5  6.0 - 8.3 g/dL Final  . Albumin 02/07/2014 3.7  3.5 - 5.2 g/dL  Final  . AST 02/07/2014 20  0 - 37 U/L Final  . ALT 02/07/2014 8  0 - 35 U/L Final  . Alkaline Phosphatase 02/07/2014 71  39 - 117 U/L Final  . Total Bilirubin 02/07/2014 0.6  0.3 - 1.2 mg/dL Final  . GFR calc non Af Amer 02/07/2014 47* >90 mL/min Final  . GFR calc Af Amer 02/07/2014 54* >90 mL/min Final   Comment: (NOTE)                          The eGFR has been calculated using the CKD EPI equation.                          This calculation has not been validated in all clinical situations.                          eGFR's persistently <90 mL/min signify possible Chronic Kidney                          Disease.    PATHOLOGY: Adenocarcinoma, EGFR exon 19 deletion.  Urinalysis    Component Value Date/Time   COLORURINE YELLOW 11/16/2011 1600   APPEARANCEUR CLEAR 11/16/2011 1600   LABSPEC 1.021 11/16/2011 1600   PHURINE 5.0 11/16/2011 1600   GLUCOSEU NEGATIVE 11/16/2011 1600   HGBUR MODERATE* 11/16/2011 1600   BILIRUBINUR NEGATIVE 11/16/2011 1600   KETONESUR NEGATIVE 11/16/2011 1600    PROTEINUR 30* 11/16/2011 1600   UROBILINOGEN 1.0 11/16/2011 1600   NITRITE NEGATIVE 11/16/2011 1600   LEUKOCYTESUR NEGATIVE 11/16/2011 1600    RADIOGRAPHIC STUDIES: Dg Chest 2 View  02/07/2014   CLINICAL DATA:  Dyspnea  EXAM: CHEST  2 VIEW  COMPARISON:  CT CHEST W/CM dated 07/31/2013; DG RIBS UNILATERAL*R* dated 06/11/2013; DG CHEST 2 VIEW dated 06/11/2013  FINDINGS: The lungs are adequately inflated. There is chronic obscuration of the left heart border which may reflect chronic lingular atelectasis or may merely be related to the diffuse pulmonary fibrosis that is present bilaterally. The cardiopericardial silhouette does not appear enlarged. The pulmonary vascularity is not engorged. There is apical pleural thickening bilaterally. The bony thorax is osteopenic. There is stable wedge compression of the body of T12 or L1 with loss of height of approximately 20%.  IMPRESSION: There are extensive chronic changes within both lungs consistent with pulmonary fibrosis. There is no definite evidence of pneumonia nor CHF.   Electronically Signed   By: David  [place]   On: 02/07/2014 17:52   Ct Chest W Contrast  02/12/2014   CLINICAL DATA:  Restaging lung cancer  EXAM: CT CHEST, ABDOMEN, AND PELVIS WITH CONTRAST  TECHNIQUE: Multidetector CT imaging of the chest, abdomen and pelvis was performed following the standard protocol during bolus administration of intravenous contrast.  CONTRAST:  10m OMNIPAQUE IOHEXOL 300 MG/ML  SOLN  COMPARISON:  07/31/2013  FINDINGS: CT CHEST FINDINGS  Postoperative changes in the left lung from prior upper lobectomy. Stable volume loss on the left.  Subpleural nodule in the left lower lobe on image 28 measures 4 mm, stable. Mixed solid and sub solid nodule in the right upper lobe on image 26 measures 6 mm, stable. 7 mm pulmonary nodule in the right upper lobe on image 20 is stable. 10 mm posterior right upper lobe nodule is noted on image 14, better delineated than prior study.  There are  several other small scattered right upper lobe pulmonary nodules which were not definitively seen on prior study, best imaged on images 16 and 18. 4 mm right lower lobe pulmonary nodule on image 28 is stable since prior study. Nodule anteriorly on image 29 measures 9 mm, better seen on today's study. This is along the minor fissure on the sagittal image and is very flat, likely intrapulmonary lymph node or area of scar.  Fine reticular pattern noted throughout the lungs bilaterally, stable since prior study. No pleural effusions.  Heart is upper limits normal in size. Dense coronary artery and mitral valve calcifications. Aortic calcifications without aneurysm.  Small right paratracheal lymph nodes. Right paratracheal lymph node on image 16 has a short axis diameter of 8 mm, stable. No mediastinal, hilar or axillary adenopathy. No supraclavicular adenopathy.  Chest wall soft tissues are unremarkable.  CT ABDOMEN AND PELVIS FINDINGS  Liver, spleen, pancreas, adrenals are unremarkable. Bilateral parapelvic cysts within the kidneys. No focal renal abnormality.  Large stool burden throughout the colon. Descending colonic and sigmoid diverticulosis. No active diverticulitis. Small bowel is decompressed.  Aorta is calcified, non aneurysmal. No free fluid, free air or adenopathy.  Prior hysterectomy.  No adnexal masses.  No acute bony abnormality. Degenerative changes throughout the thoracolumbar spine.  IMPRESSION: The previously seen pulmonary nodules are stable. However, there appears to be new ground-glass pulmonary nodules in the right upper lobe. These are small and nonspecific. Cannot exclude metastases. Recommend close interval follow-up and attention on followup imaging.  Stable interstitial disease within the lungs. Stable postoperative changes on the left.  Coronary artery disease.  Left colonic diverticulosis.  No evidence of metastatic disease in the abdomen or pelvis.   Electronically Signed   By: Rolm Baptise M.D.   On: 02/12/2014 11:42   Ct Abdomen Pelvis W Contrast  02/12/2014   CLINICAL DATA:  Restaging lung cancer  EXAM: CT CHEST, ABDOMEN, AND PELVIS WITH CONTRAST  TECHNIQUE: Multidetector CT imaging of the chest, abdomen and pelvis was performed following the standard protocol during bolus administration of intravenous contrast.  CONTRAST:  56m OMNIPAQUE IOHEXOL 300 MG/ML  SOLN  COMPARISON:  07/31/2013  FINDINGS: CT CHEST FINDINGS  Postoperative changes in the left lung from prior upper lobectomy. Stable volume loss on the left.  Subpleural nodule in the left lower lobe on image 28 measures 4 mm, stable. Mixed solid and sub solid nodule in the right upper lobe on image 26 measures 6 mm, stable. 7 mm pulmonary nodule in the right upper lobe on image 20 is stable. 10 mm posterior right upper lobe nodule is noted on image 14, better delineated than prior study. There are several other small scattered right upper lobe pulmonary nodules which were not definitively seen on prior study, best imaged on images 16 and 18. 4 mm right lower lobe pulmonary nodule on image 28 is stable since prior study. Nodule anteriorly on image 29 measures 9 mm, better seen on today's study. This is along the minor fissure on the sagittal image and is very flat, likely intrapulmonary lymph node or area of scar.  Fine reticular pattern noted throughout the lungs bilaterally, stable since prior study. No pleural effusions.  Heart is upper limits normal in size. Dense coronary artery and mitral valve calcifications. Aortic calcifications without aneurysm.  Small right paratracheal lymph nodes. Right paratracheal lymph node on image 16 has a short axis diameter of 8 mm, stable. No mediastinal, hilar or axillary adenopathy.  No supraclavicular adenopathy.  Chest wall soft tissues are unremarkable.  CT ABDOMEN AND PELVIS FINDINGS  Liver, spleen, pancreas, adrenals are unremarkable. Bilateral parapelvic cysts within the kidneys. No focal  renal abnormality.  Large stool burden throughout the colon. Descending colonic and sigmoid diverticulosis. No active diverticulitis. Small bowel is decompressed.  Aorta is calcified, non aneurysmal. No free fluid, free air or adenopathy.  Prior hysterectomy.  No adnexal masses.  No acute bony abnormality. Degenerative changes throughout the thoracolumbar spine.  IMPRESSION: The previously seen pulmonary nodules are stable. However, there appears to be new ground-glass pulmonary nodules in the right upper lobe. These are small and nonspecific. Cannot exclude metastases. Recommend close interval follow-up and attention on followup imaging.  Stable interstitial disease within the lungs. Stable postoperative changes on the left.  Coronary artery disease.  Left colonic diverticulosis.  No evidence of metastatic disease in the abdomen or pelvis.   Electronically Signed   By: Rolm Baptise M.D.   On: 02/12/2014 11:42    ASSESSMENT:  #1. Probable progressive adenocarcinoma of the lung, still not symptomatically worse considering the patient's degree of activity. #2. Status post left cerebral CVA with right hemiparesis , plateaued with no further visible therapy being offered. #3. Hypertension, controlled.   PLAN:  #1. In the presence of her son who accompanied her today, a discussion was no regarding appropriateness of intervention. Symptom control would be the primary goal at this point in time symptoms have not worsened to the point where oral Tarceva would be beneficial. Certainly if the patient develops he fusion, drainage could be performed. If she develops worsening shortness of breath despite lack of pleural effusion or pneumonia, treatment with 50 mg of Tarceva may be justifiable. The patient is in agreement with this strategy. #2. She was told to call should any symptoms worsened over the next 3 months. #3. Followup in 3 months with CBC, chem profile, and LDH.   All questions were answered. The  patient knows to call the clinic with any problems, questions or concerns. We can certainly see the patient much sooner if necessary.   I spent 25 minutes counseling the patient face to face. The total time spent in the appointment was 30 minutes.    Farrel Gobble, MD 02/17/2014 3:32 PM  DISCLAIMER:  This note was dictated with voice recognition software.  Similar sounding words can inadvertently be transcribed inaccurately and may not be corrected upon review.

## 2014-02-17 NOTE — Patient Instructions (Signed)
[facility] Discharge Instructions  RECOMMENDATIONS MADE BY THE CONSULTANT AND ANY TEST RESULTS WILL BE SENT TO YOUR REFERRING PHYSICIAN.  We will see you in 3 months for a doctor visit and lab work. Please call for any questions or concerns.  Thank you for choosing [facility] to provide your oncology and hematology care.  To afford each patient quality time with our providers, please arrive at least 15 minutes before your scheduled appointment time.  With your help, our goal is to use those 15 minutes to complete the necessary work-up to ensure our physicians have the information they need to help with your evaluation and healthcare recommendations.    Effective January 1st, 2014, we ask that you re-schedule your appointment with our physicians should you arrive 10 or more minutes late for your appointment.  We strive to give you quality time with our providers, and arriving late affects you and other patients whose appointments are after yours.    Again, thank you for choosing [facility].  Our hope is that these requests will decrease the amount of time that you wait before being seen by our physicians.       _____________________________________________________________  Should you have questions after your visit to [facility], please contact our office at [phone] between the hours of 8:30 a.m. and 5:00 p.m.  Voicemails left after 4:30 p.m. will not be returned until the following business day.  For prescription refill requests, have your pharmacy contact our office with your prescription refill request.

## 2014-04-17 ENCOUNTER — Telehealth: Payer: Self-pay | Admitting: Family Medicine

## 2014-04-17 NOTE — Telephone Encounter (Signed)
rx faxed to [facility]. Pt's daughter notified on voicemail.

## 2014-04-17 NOTE — Telephone Encounter (Signed)
Please write, I'll sign , then send, Rea: weakness/ataxia/stroke

## 2014-04-17 NOTE — Telephone Encounter (Signed)
[name] called and asked if Dr. Nicki Reaper would send Rx into [facility] for a shower chair for her mother, Stephanie [name].

## 2014-04-29 ENCOUNTER — Telehealth: Payer: Self-pay | Admitting: Family Medicine

## 2014-04-29 NOTE — Telephone Encounter (Signed)
No appt for second week of aug if we make one for her

## 2014-04-29 NOTE — Telephone Encounter (Signed)
Please go ahead and fax prescription to [facility] as requested

## 2014-04-29 NOTE — Telephone Encounter (Signed)
Rx faxed to [facility]. Patient notified

## 2014-04-29 NOTE — Telephone Encounter (Signed)
Sorry ignore the note about no appt 2nd week  Of Aug, I don't know how I keep quick noting on other pts mssgs

## 2014-04-29 NOTE — Telephone Encounter (Signed)
Pts daughter would like to know if you can call her in  A walker to use that has the chair for her to sit on?   [facility]?  Same place the shower chair was called into

## 2014-05-01 ENCOUNTER — Telehealth: Payer: Self-pay | Admitting: Family Medicine

## 2014-05-01 NOTE — Telephone Encounter (Signed)
Patients daughter called and said that she would like the Rx for the walker she requested that she called about on 04/29/14 sent to [facility] in [place] instead.

## 2014-05-01 NOTE — Telephone Encounter (Signed)
Pt's daughter notified. I faxed over to [facility] in [place].

## 2014-05-20 ENCOUNTER — Encounter (HOSPITAL_COMMUNITY): Payer: Self-pay

## 2014-05-20 ENCOUNTER — Ambulatory Visit (INDEPENDENT_AMBULATORY_CARE_PROVIDER_SITE_OTHER): Payer: Medicare Other | Admitting: Family Medicine

## 2014-05-20 ENCOUNTER — Encounter (HOSPITAL_BASED_OUTPATIENT_CLINIC_OR_DEPARTMENT_OTHER): Payer: Medicare Other

## 2014-05-20 ENCOUNTER — Encounter: Payer: Self-pay | Admitting: Family Medicine

## 2014-05-20 ENCOUNTER — Encounter (HOSPITAL_COMMUNITY): Payer: Medicare Other | Attending: Hematology

## 2014-05-20 ENCOUNTER — Telehealth (HOSPITAL_COMMUNITY): Payer: Self-pay | Admitting: Emergency Medicine

## 2014-05-20 VITALS — BP 118/82 | Ht 67.0 in | Wt 145.0 lb

## 2014-05-20 VITALS — BP 121/53 | HR 98 | Temp 97.8°F | Resp 16 | Wt 148.9 lb

## 2014-05-20 DIAGNOSIS — I10 Essential (primary) hypertension: Secondary | ICD-10-CM | POA: Diagnosis not present

## 2014-05-20 DIAGNOSIS — I63239 Cerebral infarction due to unspecified occlusion or stenosis of unspecified carotid arteries: Secondary | ICD-10-CM

## 2014-05-20 DIAGNOSIS — R918 Other nonspecific abnormal finding of lung field: Secondary | ICD-10-CM | POA: Diagnosis not present

## 2014-05-20 DIAGNOSIS — R279 Unspecified lack of coordination: Secondary | ICD-10-CM

## 2014-05-20 DIAGNOSIS — C3492 Malignant neoplasm of unspecified part of left bronchus or lung: Secondary | ICD-10-CM

## 2014-05-20 DIAGNOSIS — C3490 Malignant neoplasm of unspecified part of unspecified bronchus or lung: Secondary | ICD-10-CM | POA: Diagnosis not present

## 2014-05-20 DIAGNOSIS — E785 Hyperlipidemia, unspecified: Secondary | ICD-10-CM | POA: Insufficient documentation

## 2014-05-20 DIAGNOSIS — K5732 Diverticulitis of large intestine without perforation or abscess without bleeding: Secondary | ICD-10-CM | POA: Insufficient documentation

## 2014-05-20 DIAGNOSIS — I2510 Atherosclerotic heart disease of native coronary artery without angina pectoris: Secondary | ICD-10-CM | POA: Insufficient documentation

## 2014-05-20 DIAGNOSIS — J8410 Pulmonary fibrosis, unspecified: Secondary | ICD-10-CM | POA: Diagnosis not present

## 2014-05-20 DIAGNOSIS — M6281 Muscle weakness (generalized): Secondary | ICD-10-CM

## 2014-05-20 DIAGNOSIS — I69922 Dysarthria following unspecified cerebrovascular disease: Secondary | ICD-10-CM | POA: Insufficient documentation

## 2014-05-20 DIAGNOSIS — E7800 Pure hypercholesterolemia, unspecified: Secondary | ICD-10-CM | POA: Insufficient documentation

## 2014-05-20 LAB — CBC WITH DIFFERENTIAL/PLATELET
Basophils Absolute: 0 10*3/uL (ref 0.0–0.1)
Basophils Relative: 1 % (ref 0–1)
EOS PCT: 2 % (ref 0–5)
Eosinophils Absolute: 0.1 10*3/uL (ref 0.0–0.7)
HCT: 38.3 % (ref 36.0–46.0)
HEMOGLOBIN: 13 g/dL (ref 12.0–15.0)
LYMPHS ABS: 1 10*3/uL (ref 0.7–4.0)
Lymphocytes Relative: 19 % (ref 12–46)
MCH: 32.7 pg (ref 26.0–34.0)
MCHC: 33.9 g/dL (ref 30.0–36.0)
MCV: 96.2 fL (ref 78.0–100.0)
MONOS PCT: 9 % (ref 3–12)
Monocytes Absolute: 0.5 10*3/uL (ref 0.1–1.0)
NEUTROS PCT: 69 % (ref 43–77)
Neutro Abs: 3.7 10*3/uL (ref 1.7–7.7)
Platelets: 160 10*3/uL (ref 150–400)
RBC: 3.98 MIL/uL (ref 3.87–5.11)
RDW: 12 % (ref 11.5–15.5)
WBC: 5.3 10*3/uL (ref 4.0–10.5)

## 2014-05-20 LAB — COMPREHENSIVE METABOLIC PANEL
ALK PHOS: 75 U/L (ref 39–117)
ALT: 9 U/L (ref 0–35)
AST: 20 U/L (ref 0–37)
Albumin: 3.6 g/dL (ref 3.5–5.2)
Anion gap: 9 (ref 5–15)
BILIRUBIN TOTAL: 0.6 mg/dL (ref 0.3–1.2)
BUN: 17 mg/dL (ref 6–23)
CO2: 27 meq/L (ref 19–32)
Calcium: 9.2 mg/dL (ref 8.4–10.5)
Chloride: 103 mEq/L (ref 96–112)
Creatinine, Ser: 1.23 mg/dL — ABNORMAL HIGH (ref 0.50–1.10)
GFR calc Af Amer: 50 mL/min — ABNORMAL LOW (ref >90)
GFR, EST NON AFRICAN AMERICAN: 43 mL/min — AB (ref >90)
GLUCOSE: 93 mg/dL (ref 70–99)
POTASSIUM: 4.3 meq/L (ref 3.7–5.3)
SODIUM: 139 meq/L (ref 137–147)
Total Protein: 7.1 g/dL (ref 6.0–8.3)

## 2014-05-20 LAB — LACTATE DEHYDROGENASE: LDH: 237 U/L (ref 94–250)

## 2014-05-20 MED ORDER — SIMVASTATIN 20 MG PO TABS: 20.0000 mg | ORAL_TABLET | Freq: Every day | ORAL | Status: DC

## 2014-05-20 MED ORDER — AMLODIPINE BESYLATE 5 MG PO TABS: 5.0000 mg | ORAL_TABLET | Freq: Every day | ORAL | Status: DC

## 2014-05-20 MED ORDER — CITALOPRAM HYDROBROMIDE 20 MG PO TABS: 20.0000 mg | ORAL_TABLET | Freq: Every day | ORAL | Status: DC

## 2014-05-20 MED ORDER — ATENOLOL 50 MG PO TABS: 50.0000 mg | ORAL_TABLET | ORAL | Status: DC

## 2014-05-20 NOTE — Progress Notes (Signed)
   Subjective:    Patient ID: Stephanie [name], female    DOB: 1941/05/06, 73 y.o.   MRN: [id number]  HPI Patient and family would like to discuss trying to get patient home health assistance daily. Thinks she needs an aide to come to her house daily for several hours a day to help with activities of daily living and household chores.  Patient has a hard time with ADL, cleaning and cooking.  Family is trying to keep patient at home and avoid patient going into Assisited living. 25 minutes spent discussing stroke, prevention stroke, gait training, trying to qualify for assistance at home. Also patient had significant amount of frustrations.  Review of Systems    denies chest tightness shortness breath nausea vomiting diarrhea. 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 She also has significant speech pathology from her stroke.       Assessment & Plan:  Gait training-referral for home health and home physical therapy. This patient would benefit.  This patient has significant disability from her stroke. Family and her 1 her to live independently if possible. She would benefit from having an aide come into the home for a few hours per day. They are meeting with help referral specialist at aging, disability and transportation. Stephanie [name]) certainly if there is any paperwork or orders at need to be given from [place] we'll be more than happy to grant this.  Stroke-stable  Secondary prevention lipid profile recommended  Increase Celexa to 20 mg daily recheck patient in 3 months  Tommy-brother- [phone]  Face to face was completed today

## 2014-05-20 NOTE — Telephone Encounter (Signed)
Telephone call to daughter kim notifying her that her mothers lab work WNL today

## 2014-05-20 NOTE — Progress Notes (Signed)
[facility] OFFICE PROGRESS NOTE  PCP [name], MD [address] [place] [address]  DIAGNOSIS: Adenocarcinoma of left lung, stage 1 - Plan: CT Chest Wo Contrast  CURRENT THERAPY:  Observation  HEMATOLOGY/ONCOLOGY HX: Ms. Seward is a 73 y.o. female who returns for followup of an adenocarcinoma of the lung. She had surgery on 11/18/11 for well differentiated disease. There were no lymph node metastasis and she received no adjuvant therapy. Her tumor was associated with an exon 19 deletion. CT Chest scanning has been demonstrating small bilateral lung nodules. In the April 2015 study, these nodules were stable however new ground glass nodules were seen in the right upper lobe. The patient does not report any interval change in her breathing, cough, or onset of chest pain or hemoptysis. She lives alone and appetite seems good when family members visit.          MEDICAL HISTORY:  Past Medical History  Diagnosis Date  . CVA (cerebral infarction)     speech difficulities  . Stroke   . Hypercholesteremia     Takes Zocor daily  . Hypertension     takes Atenolol and Lisinopril daily  . Lung mass     left upper lobe  . Arthritis     hands  . TIA (transient ischemic attack)     Sat before thanksgiving;impaired speech  . Pneumothorax   . Lung cancer     has CVA (cerebral infarction); HTN (hypertension); Pneumothorax of left lung after biopsy; Adenocarcinoma of left lung, stage 1; Dysarthria as late effect of stroke; Bilateral carotid artery disease; Other and unspecified hyperlipidemia; Lack of coordination; and Muscle weakness (generalized) on her problem list.    ALLERGIES:  has No Known Allergies.  MEDICATIONS: has a current medication list which includes the following prescription(s): aspirin ec, amlodipine, atenolol, citalopram, prochlorperazine, and simvastatin.  FAMILY HISTORY: family history includes Cancer in her brother;  Diabetes in her brother; Stroke in her brother, mother, and sister. There is no history of Anesthesia problems, Hypotension, Malignant hyperthermia, or Pseudochol deficiency.  REVIEW OF SYSTEMS:    SINCE YOUR LAST VISIT Been diagnosed or treated for a new medical /surgical  problem or condition: No Any Recent Xrays or studies performed: No Any new prescription or OTC medications: No ECOG Perf Status: Ambulatory and capable of all selfcare but unable to carry out any work activities.  Up and about more than 50% of waking hours Problems sleeping: No Medications taken to help sleep: No How is your appetie: 75% normal Any Supplements: No Any trouble chewing or swallowing: No Any Nausea or Vomiting: No Any Bowel problems: No # Bowel Movements per week: 7 Any Urinary Issues: No Any Cardiac Problems: No Any Respiratory Issues: Yes (Sob with activity) Any Neurological Issues: No Do you live alone: Yes (lives in apt. for elderly with access to people) Feelings hopelessness: No You or your family have any concerns or Health changes: Yes (losing strenth, loss of appetite) Pain Assessment Pain Score: 0-No pain  Other than that discussed above is noncontributory.   PHYSICAL EXAMINATION:   weight is 148 lb 14.4 oz (67.541 kg). Her oral temperature is 97.8 F (36.6 C). Her blood pressure is 121/53 and her pulse is 98. Her respiration is 16.    GENERAL: alert, no distress and comfortable.Speech is mildly dysarthric. SKIN: skin color, texture, turgor are normal, no rashes or significant lesions. No cyanosis of digits.  EYES: PERLA; Conjunctiva are  pink and non-injected, sclera clear OROPHARYNX: no exudate, no erythema on lips, buccal mucosa, or tongue. NECK: supple, thyroid normal size, non-tender, without nodularity. No masses LYMPH:  no palpable lymphadenopathy in the cervical, axillary or inguinal LUNGS: clear to auscultation and percussion. HEART: regular rate & rhythm and no  murmurs. ABDOMEN: abdomen soft, non-tender and normal bowel sounds, no masses palpated MUSCULOSKELETAL/EXTREMITIES: Degenerative changes in her hands and right knee. No effusion    LABORATORY DATA: Lab on 05/20/2014  Component Date Value Ref Range Status  . WBC 05/20/2014 5.3  4.0 - 10.5 K/uL Final  . RBC 05/20/2014 3.98  3.87 - 5.11 MIL/uL Final  . Hemoglobin 05/20/2014 13.0  12.0 - 15.0 g/dL Final  . HCT 05/20/2014 38.3  36.0 - 46.0 % Final  . MCV 05/20/2014 96.2  78.0 - 100.0 fL Final  . MCH 05/20/2014 32.7  26.0 - 34.0 pg Final  . MCHC 05/20/2014 33.9  30.0 - 36.0 g/dL Final  . RDW 05/20/2014 12.0  11.5 - 15.5 % Final  . Platelets 05/20/2014 160  150 - 400 K/uL Final  . Neutrophils Relative % 05/20/2014 69  43 - 77 % Final  . Neutro Abs 05/20/2014 3.7  1.7 - 7.7 K/uL Final  . Lymphocytes Relative 05/20/2014 19  12 - 46 % Final  . Lymphs Abs 05/20/2014 1.0  0.7 - 4.0 K/uL Final  . Monocytes Relative 05/20/2014 9  3 - 12 % Final  . Monocytes Absolute 05/20/2014 0.5  0.1 - 1.0 K/uL Final  . Eosinophils Relative 05/20/2014 2  0 - 5 % Final  . Eosinophils Absolute 05/20/2014 0.1  0.0 - 0.7 K/uL Final  . Basophils Relative 05/20/2014 1  0 - 1 % Final  . Basophils Absolute 05/20/2014 0.0  0.0 - 0.1 K/uL Final  . Sodium 05/20/2014 139  137 - 147 mEq/L Final  . Potassium 05/20/2014 4.3  3.7 - 5.3 mEq/L Final  . Chloride 05/20/2014 103  96 - 112 mEq/L Final  . CO2 05/20/2014 27  19 - 32 mEq/L Final  . Glucose, Bld 05/20/2014 93  70 - 99 mg/dL Final  . BUN 05/20/2014 17  6 - 23 mg/dL Final  . Creatinine, Ser 05/20/2014 1.23* 0.50 - 1.10 mg/dL Final  . Calcium 05/20/2014 9.2  8.4 - 10.5 mg/dL Final  . Total Protein 05/20/2014 7.1  6.0 - 8.3 g/dL Final  . Albumin 05/20/2014 3.6  3.5 - 5.2 g/dL Final  . AST 05/20/2014 20  0 - 37 U/L Final  . ALT 05/20/2014 9  0 - 35 U/L Final  . Alkaline Phosphatase 05/20/2014 75  39 - 117 U/L Final  . Total Bilirubin 05/20/2014 0.6  0.3 - 1.2 mg/dL  Final  . GFR calc non Af Amer 05/20/2014 43* >90 mL/min Final  . GFR calc Af Amer 05/20/2014 50* >90 mL/min Final   Comment: (NOTE)                          The eGFR has been calculated using the CKD EPI equation.                          This calculation has not been validated in all clinical situations.                          eGFR's persistently <90 mL/min signify possible Chronic Kidney  Disease.  . Anion gap 05/20/2014 9  5 - 15 Final  . LDH 05/20/2014 237  94 - 250 U/L Final      RADIOGRAPHIC STUDIES: CLINICAL DATA: Restaging lung cancer  EXAM:  CT CHEST, ABDOMEN, AND PELVIS WITH CONTRAST  TECHNIQUE:  Multidetector CT imaging of the chest, abdomen and pelvis was  performed following the standard protocol during bolus  administration of intravenous contrast.  CONTRAST: 10mL OMNIPAQUE IOHEXOL 300 MG/ML SOLN  COMPARISON: 07/31/2013  FINDINGS:  CT CHEST FINDINGS  Postoperative changes in the left lung from prior upper lobectomy.  Stable volume loss on the left.  Subpleural nodule in the left lower lobe on image 28 measures 4 mm,  stable. Mixed solid and sub solid nodule in the right upper lobe on  image 26 measures 6 mm, stable. 7 mm pulmonary nodule in the right  upper lobe on image 20 is stable. 10 mm posterior right upper lobe  nodule is noted on image 14, better delineated than prior study.  There are several other small scattered right upper lobe pulmonary  nodules which were not definitively seen on prior study, best imaged  on images 16 and 18. 4 mm right lower lobe pulmonary nodule on image  28 is stable since prior study. Nodule anteriorly on image 29  measures 9 mm, better seen on today's study. This is along the minor  fissure on the sagittal image and is very flat, likely  intrapulmonary lymph node or area of scar.  Fine reticular pattern noted throughout the lungs bilaterally,  stable since prior study. No pleural effusions.  Heart is  upper limits normal in size. Dense coronary artery and  mitral valve calcifications. Aortic calcifications without aneurysm.  Small right paratracheal lymph nodes. Right paratracheal lymph node  on image 16 has a short axis diameter of 8 mm, stable. No  mediastinal, hilar or axillary adenopathy. No supraclavicular  adenopathy.  Chest wall soft tissues are unremarkable.  CT ABDOMEN AND PELVIS FINDINGS  Liver, spleen, pancreas, adrenals are unremarkable. Bilateral  parapelvic cysts within the kidneys. No focal renal abnormality.  Large stool burden throughout the colon. Descending colonic and  sigmoid diverticulosis. No active diverticulitis. Small bowel is  decompressed.  Aorta is calcified, non aneurysmal. No free fluid, free air or  adenopathy.  Prior hysterectomy. No adnexal masses.  No acute bony abnormality. Degenerative changes throughout the  thoracolumbar spine.  IMPRESSION:  The previously seen pulmonary nodules are stable. However, there  appears to be new ground-glass pulmonary nodules in the right upper  lobe. These are small and nonspecific. Cannot exclude metastases.  Recommend close interval follow-up and attention on followup  imaging.  Stable interstitial disease within the lungs. Stable postoperative  changes on the left.  Coronary artery disease.  Left colonic diverticulosis.  No evidence of metastatic disease in the abdomen or pelvis.  Electronically Signed  By: Charlett Nose M.D.   ASSESSMENT:   Symptomatically, Ms. Lim is stable with regards to her non-small cell bronchogenic carcinoma. She seems to be able to ambulate using a walker. On scans, she appears to have small volume disease in the lungs with the majority of the nodules being subcentimeter in size. The largest one was 18.54mm in the RLL and was stable. Blood test results above have been reviewed.     RECOMMENDATIONS:        A repeat chest CT scan will be scheduled without IV contrast. Return  appointment to the cancer clinic to discuss the  results and review any              indications for more aggressive treatment. The patient and her son were agreeable. He plans to discuss the possibility of arranging for a home          care aide with Dr. Wolfgang Phoenix.       Darrall Dears, MD 05/20/2014 8:34 PM

## 2014-05-20 NOTE — Progress Notes (Signed)
LABS DRAWN FOR CBCD,CMP,LDH

## 2014-05-20 NOTE — Patient Instructions (Signed)
[facility] Discharge Instructions  RECOMMENDATIONS MADE BY THE CONSULTANT AND ANY TEST RESULTS WILL BE SENT TO YOUR REFERRING PHYSICIAN.  EXAM FINDINGS BY THE PHYSICIAN TODAY AND SIGNS OR SYMPTOMS TO REPORT TO CLINIC OR PRIMARY PHYSICIAN: You saw Dr Bubba Hales today  Chest CT scheduled, and follow-up with doctor in 2-3 weeks  Thank you for choosing [facility] to provide your oncology and hematology care.  To afford each patient quality time with our providers, please arrive at least 15 minutes before your scheduled appointment time.  With your help, our goal is to use those 15 minutes to complete the necessary work-up to ensure our physicians have the information they need to help with your evaluation and healthcare recommendations.    Effective January 1st, 2014, we ask that you re-schedule your appointment with our physicians should you arrive 10 or more minutes late for your appointment.  We strive to give you quality time with our providers, and arriving late affects you and other patients whose appointments are after yours.    Again, thank you for choosing [facility]  Our hope is that these requests will decrease the amount of time that you wait before being seen by our physicians.       _____________________________________________________________  Should you have questions after your visit to [facility], please contact our office at [phone] between the hours of 8:30 a.m. and 5:00 p.m.  Voicemails left after 4:30 p.m. will not be returned until the following business day.  For prescription refill requests, have your pharmacy contact our office with your prescription refill request.

## 2014-05-21 NOTE — Progress Notes (Signed)
Discussed with brother. Brother verbalized understanding.

## 2014-05-26 ENCOUNTER — Ambulatory Visit (HOSPITAL_COMMUNITY)
Admission: RE | Admit: 2014-05-26 | Discharge: 2014-05-26 | Disposition: A | Discharge status: Home / Self Care | Payer: Medicare Other | Source: Ambulatory Visit | Attending: Oncology | Admitting: Oncology

## 2014-05-26 DIAGNOSIS — C3490 Malignant neoplasm of unspecified part of unspecified bronchus or lung: Secondary | ICD-10-CM | POA: Insufficient documentation

## 2014-05-26 DIAGNOSIS — I2584 Coronary atherosclerosis due to calcified coronary lesion: Secondary | ICD-10-CM | POA: Diagnosis not present

## 2014-05-26 DIAGNOSIS — K8020 Calculus of gallbladder without cholecystitis without obstruction: Secondary | ICD-10-CM | POA: Diagnosis not present

## 2014-05-26 DIAGNOSIS — J984 Other disorders of lung: Secondary | ICD-10-CM | POA: Diagnosis present

## 2014-05-26 DIAGNOSIS — C3492 Malignant neoplasm of unspecified part of left bronchus or lung: Secondary | ICD-10-CM

## 2014-05-26 LAB — LIPID PANEL
Cholesterol: 171 mg/dL (ref 0–200)
HDL: 57 mg/dL (ref >39)
LDL CALC: 98 mg/dL (ref 0–99)
Total CHOL/HDL Ratio: 3 Ratio
Triglycerides: 78 mg/dL (ref <150)
VLDL: 16 mg/dL (ref 0–40)

## 2014-05-30 ENCOUNTER — Telehealth: Payer: Self-pay | Admitting: *Deleted

## 2014-05-30 DIAGNOSIS — C3490 Malignant neoplasm of unspecified part of unspecified bronchus or lung: Secondary | ICD-10-CM

## 2014-05-30 DIAGNOSIS — I10 Essential (primary) hypertension: Secondary | ICD-10-CM

## 2014-05-30 DIAGNOSIS — I69922 Dysarthria following unspecified cerebrovascular disease: Secondary | ICD-10-CM

## 2014-05-30 DIAGNOSIS — Z602 Problems related to living alone: Secondary | ICD-10-CM

## 2014-05-30 DIAGNOSIS — R279 Unspecified lack of coordination: Secondary | ICD-10-CM

## 2014-05-30 DIAGNOSIS — M159 Polyosteoarthritis, unspecified: Secondary | ICD-10-CM

## 2014-05-30 NOTE — Telephone Encounter (Signed)
Ok lets 

## 2014-05-30 NOTE — Addendum Note (Signed)
Addended byCharolotte Capuchin D on: 05/30/2014 11:52 AM   Modules accepted: Orders

## 2014-05-30 NOTE — Progress Notes (Signed)
Patient's caregiver notified and verbalized understanding

## 2014-05-30 NOTE — Telephone Encounter (Signed)
Referral placed for social work.

## 2014-05-30 NOTE — Telephone Encounter (Signed)
Domingo Mend from [facility] would like an order/referral for a medical social evaluation. Elliyah wants to live independently, so Vaughan Basta would like for her to be evaluated for more help with daily activities such as house work and cooking. She also wants to see if she would qualify for [facility], which is a service that would come to her house to help her as well.

## 2014-06-04 ENCOUNTER — Encounter (HOSPITAL_COMMUNITY): Payer: Medicare Other | Attending: Hematology and Oncology

## 2014-06-04 ENCOUNTER — Encounter (HOSPITAL_COMMUNITY): Payer: Self-pay

## 2014-06-04 ENCOUNTER — Encounter (HOSPITAL_BASED_OUTPATIENT_CLINIC_OR_DEPARTMENT_OTHER): Payer: Medicare Other

## 2014-06-04 VITALS — BP 133/59 | HR 54 | Temp 97.5°F | Resp 18 | Wt 150.0 lb

## 2014-06-04 DIAGNOSIS — C3490 Malignant neoplasm of unspecified part of unspecified bronchus or lung: Secondary | ICD-10-CM | POA: Insufficient documentation

## 2014-06-04 DIAGNOSIS — C3410 Malignant neoplasm of upper lobe, unspecified bronchus or lung: Secondary | ICD-10-CM

## 2014-06-04 DIAGNOSIS — C3492 Malignant neoplasm of unspecified part of left bronchus or lung: Secondary | ICD-10-CM

## 2014-06-04 DIAGNOSIS — I10 Essential (primary) hypertension: Secondary | ICD-10-CM

## 2014-06-04 DIAGNOSIS — K8020 Calculus of gallbladder without cholecystitis without obstruction: Secondary | ICD-10-CM

## 2014-06-04 DIAGNOSIS — J8489 Other specified interstitial pulmonary diseases: Secondary | ICD-10-CM

## 2014-06-04 NOTE — Progress Notes (Signed)
[place]  OFFICE PROGRESS NOTE  LUKING,SCOTT, MD [address] [place] [address]  DIAGNOSIS: Adenocarcinoma of left lung, stage 1 - Plan: Lactate dehydrogenase  Asymptomatic cholelithiasis  Nonspecific interstitial pneumonitis  Chief Complaint  Patient presents with  . adenocarcinoma of left lung    CURRENT THERAPY: Watchful expectation and surveillance  INTERVAL HISTORY: Stephanie [name] 73 y.o. female returns for followup of adenocarcinoma of the lung, status post thoracoscopic left upper lobectomy and mediastinal lymph node dissection on 11/18/2011 for well-differentiated disease, exon 19 deletion positive, having received no postoperative therapy. It was recommended she try Tarceva because of changes on her chest CT scan but after reading the package insert, she decided not to take the medication. Justification for treatment or primarily rest upon symptomatology. She is status post CVA with speech difficulties. She was seen by Dr. Bubba Hales on 05/20/2014 order repeat CT scan of the chest which was performed on 05/26/2014. She is here today with her son in law. They have arranged for her live in an apartment and she is receiving physical therapy as well as occupational therapy. She also has home health aides 3 times a week to bathe her and also visiting nurses to supervise her drug administration. Family brings food and also takes the patient out to eat. Shortness of breath has not worsened. She denies any cough, wheezing, lower extremity swelling or redness but with persistent right-sided hemiparesis.    MEDICAL HISTORY: Past Medical History  Diagnosis Date  . CVA (cerebral infarction)     speech difficulities  . Stroke   . Hypercholesteremia     Takes Zocor daily  . Hypertension     takes Atenolol and Lisinopril daily  . Lung mass     left upper lobe  . Arthritis     hands  . TIA (transient ischemic attack)     Sat  before thanksgiving;impaired speech  . Pneumothorax   . Lung cancer     INTERIM HISTORY: has CVA (cerebral infarction); HTN (hypertension); Pneumothorax of left lung after biopsy; Adenocarcinoma of left lung, stage 1; Dysarthria as late effect of stroke; Bilateral carotid artery disease; Other and unspecified hyperlipidemia; Lack of coordination; Muscle weakness (generalized); and Hyperlipidemia on her problem list.    ALLERGIES:  has No Known Allergies.  MEDICATIONS: has a current medication list which includes the following prescription(s): amlodipine, aspirin ec, atenolol, citalopram, prochlorperazine, and simvastatin.  SURGICAL HISTORY:  Past Surgical History  Procedure Laterality Date  . No past surgeries    . Lung biopsy  10/13/11    left  . Abdominal hysterectomy  1975  . Varicose vein surgery  52yr ago  . Video assisted thoracoscopy  11/18/2011    Procedure: VIDEO ASSISTED THORACOSCOPY;  Surgeon: SMelrose Nakayama MD;  [facility] [facility]  Service: Thoracic;  Laterality: Left;  . Lobectomy  11/18/2011    Procedure: LOBECTOMY;  Surgeon: SMelrose Nakayama MD;  [facility] [facility]  Service: Thoracic;  Laterality: Left;  LEFT UPPER LOBECTOMY  . Breast biopsy  early 2000    FAMILY HISTORY: family history includes Cancer in her brother; Diabetes in her brother; Stroke in her brother, mother, and sister. There is no history of Anesthesia problems, Hypotension, Malignant hyperthermia, or Pseudochol deficiency.  SOCIAL HISTORY:  reports that she has never smoked. She has never used smokeless tobacco. She reports that she does not drink alcohol or use illicit drugs.  REVIEW  OF SYSTEMS:  Other than that discussed above is noncontributory.  PHYSICAL EXAMINATION: ECOG PERFORMANCE STATUS: 2 - Symptomatic, <50% confined to bed  Blood pressure 133/59, pulse 54, temperature 97.5 F (36.4 C), temperature source Oral, resp. rate 18, weight 150 lb (68.04 kg), SpO2 100.00%.  GENERAL:alert,  no distress and comfortable SKIN: skin color, texture, turgor are normal, no rashes or significant lesions EYES: PERLA; Conjunctiva are pink and non-injected, sclera clear SINUSES: No redness or tenderness over maxillary or ethmoid sinuses OROPHARYNX:no exudate, no erythema on lips, buccal mucosa, or tongue. NECK: supple, thyroid normal size, non-tender, without nodularity. No masses CHEST: Normal AP diameter with no breast masses. LYMPH:  no palpable lymphadenopathy in the cervical, axillary or inguinal LUNGS: clear to auscultation and percussion with normal breathing effort HEART: regular rate & rhythm and no murmurs. ABDOMEN:abdomen soft, non-tender and normal bowel sounds MUSCULOSKELETAL:no cyanosis of digits and no clubbing. Range of motion normal.  NEURO: alert & oriented x 3 with labored, dysarthric speech, no focal motor/sensory deficits. Grade 3/5 right hemiparesis.   LABORATORY DATA: Office Visit on 05/20/2014  Component Date Value Ref Range Status  . Cholesterol 05/26/2014 171  0 - 200 mg/dL Final   Comment: ATP III Classification:                                < 200        mg/dL        Desirable                               200 - 239     mg/dL        Borderline High                               >= 240        mg/dL        High                             . Triglycerides 05/26/2014 78  <150 mg/dL Final  . HDL 05/26/2014 57  >39 mg/dL Final  . Total CHOL/HDL Ratio 05/26/2014 3.0   Final  . VLDL 05/26/2014 16  0 - 40 mg/dL Final  . LDL Cholesterol 05/26/2014 98  0 - 99 mg/dL Final   Comment:                            Total Cholesterol/HDL Ratio:CHD Risk                                                 Coronary Heart Disease Risk Table                                                                 Men       Women  1/2 Average Risk              3.4        3.3                                       Average Risk              5.0        4.4                                     2X Average Risk              9.6        7.1                                    3X Average Risk             23.4       11.0                          Use the calculated Patient Ratio above and the CHD Risk table                           to determine the patient's CHD Risk.                          ATP III Classification (LDL):                                < 100        mg/dL         Optimal                               100 - 129     mg/dL         Near or Above Optimal                               130 - 159     mg/dL         Borderline High                               160 - 189     mg/dL         High                                > 190        mg/dL         Very High                             Lab on 05/20/2014  Component Date Value Ref Range Status  . WBC 05/20/2014 5.3  4.0 - 10.5 K/uL Final  .  RBC 05/20/2014 3.98  3.87 - 5.11 MIL/uL Final  . Hemoglobin 05/20/2014 13.0  12.0 - 15.0 g/dL Final  . HCT 05/20/2014 38.3  36.0 - 46.0 % Final  . MCV 05/20/2014 96.2  78.0 - 100.0 fL Final  . MCH 05/20/2014 32.7  26.0 - 34.0 pg Final  . MCHC 05/20/2014 33.9  30.0 - 36.0 g/dL Final  . RDW 05/20/2014 12.0  11.5 - 15.5 % Final  . Platelets 05/20/2014 160  150 - 400 K/uL Final  . Neutrophils Relative % 05/20/2014 69  43 - 77 % Final  . Neutro Abs 05/20/2014 3.7  1.7 - 7.7 K/uL Final  . Lymphocytes Relative 05/20/2014 19  12 - 46 % Final  . Lymphs Abs 05/20/2014 1.0  0.7 - 4.0 K/uL Final  . Monocytes Relative 05/20/2014 9  3 - 12 % Final  . Monocytes Absolute 05/20/2014 0.5  0.1 - 1.0 K/uL Final  . Eosinophils Relative 05/20/2014 2  0 - 5 % Final  . Eosinophils Absolute 05/20/2014 0.1  0.0 - 0.7 K/uL Final  . Basophils Relative 05/20/2014 1  0 - 1 % Final  . Basophils Absolute 05/20/2014 0.0  0.0 - 0.1 K/uL Final  . Sodium 05/20/2014 139  137 - 147 mEq/L Final  . Potassium 05/20/2014 4.3  3.7 - 5.3 mEq/L Final  . Chloride 05/20/2014 103  96 - 112 mEq/L Final    . CO2 05/20/2014 27  19 - 32 mEq/L Final  . Glucose, Bld 05/20/2014 93  70 - 99 mg/dL Final  . BUN 05/20/2014 17  6 - 23 mg/dL Final  . Creatinine, Ser 05/20/2014 1.23* 0.50 - 1.10 mg/dL Final  . Calcium 05/20/2014 9.2  8.4 - 10.5 mg/dL Final  . Total Protein 05/20/2014 7.1  6.0 - 8.3 g/dL Final  . Albumin 05/20/2014 3.6  3.5 - 5.2 g/dL Final  . AST 05/20/2014 20  0 - 37 U/L Final  . ALT 05/20/2014 9  0 - 35 U/L Final  . Alkaline Phosphatase 05/20/2014 75  39 - 117 U/L Final  . Total Bilirubin 05/20/2014 0.6  0.3 - 1.2 mg/dL Final  . GFR calc non Af Amer 05/20/2014 43* >90 mL/min Final  . GFR calc Af Amer 05/20/2014 50* >90 mL/min Final   Comment: (NOTE)                          The eGFR has been calculated using the CKD EPI equation.                          This calculation has not been validated in all clinical situations.                          eGFR's persistently <90 mL/min signify possible Chronic Kidney                          Disease.  . Anion gap 05/20/2014 9  5 - 15 Final  . LDH 05/20/2014 237  94 - 250 U/L Final    PATHOLOGY: No new pathology. Adenocarcinoma, exon 19 deletion positive.  Urinalysis    Component Value Date/Time   COLORURINE YELLOW 11/16/2011 1600   APPEARANCEUR CLEAR 11/16/2011 1600   LABSPEC 1.021 11/16/2011 1600   PHURINE 5.0 11/16/2011 1600   GLUCOSEU NEGATIVE 11/16/2011 1600   HGBUR MODERATE* 11/16/2011  [place] 11/16/2011 1600   KETONESUR NEGATIVE 11/16/2011 1600   PROTEINUR 30* 11/16/2011 1600   UROBILINOGEN 1.0 11/16/2011 1600   NITRITE NEGATIVE 11/16/2011 1600   LEUKOCYTESUR NEGATIVE 11/16/2011 1600    RADIOGRAPHIC STUDIES: Ct Chest Wo Contrast  05/26/2014   CLINICAL DATA:  Followup stage I lung cancer.  EXAM: CT CHEST WITHOUT CONTRAST  TECHNIQUE: Multidetector CT imaging of the chest was performed following the standard protocol without IV contrast.  COMPARISON:  02/12/2014, 01/14/2013 and 07/16/2012.  FINDINGS: Mediastinal  lymph nodes are not enlarged by CT size criteria. Hilar regions are difficult to definitively evaluate without IV contrast. No axillary adenopathy. Atherosclerotic calcification of the arterial vasculature, including extensive three-vessel involvement of the coronary arteries. Dense mitral annulus calcification. Heart is mildly enlarged. No pericardial effusion.  Biapical pleural parenchymal scarring. Reticular coarsening and subpleural reticulation are seen without a zonal predominance. Associated traction bronchiectasis/bronchiolectasis. Findings do not appear progressive from 07/16/2012. No definite honeycombing.  There are several nodules in the right lung which are new or enlarging from 07/16/2012. Index 10 mm nodule in the apical segment right upper lobe (series 3, image 19) previously measured 4 mm on 07/16/2012. Nodular lesion in the right lower lobe measures 1.4 x 1.7 cm (image 36), stable from 07/16/2012. Postoperative changes in the left hemithorax with associated volume loss. No pleural fluid. Airway is otherwise unremarkable.  Incidental imaging of the upper abdomen shows the visualized portion of the liver to be grossly unremarkable. Stone debris layers in the gallbladder. Visualized portions of the adrenal glands are unremarkable. Possible tiny stones in the right kidney. Visualized portions of the kidneys and spleen are otherwise unremarkable. Difficult to exclude small calcified splenic artery aneurysms in the splenic hilum (series 2, image 51). Visualized portions of the pancreas, stomach and bowel are otherwise grossly unremarkable. No upper abdominal adenopathy. No worrisome lytic or sclerotic lesions. Degenerative changes are seen throughout the spine.  IMPRESSION: 1. Right lung nodules are grossly stable from 02/12/2014 but appear new/enlarged from 07/16/2012, raising concern for bronchogenic carcinoma. 2. 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 3. Three-vessel coronary artery calcification. 4. Cholelithiasis. 5. Possible calcified splenic artery aneurysms.   Electronically Signed   By: Lorin Picket M.D.   On: 05/26/2014 10:58    ASSESSMENT:  #1. Progressive nodular disease with minimal symptoms in the setting of what appears to be nonspecific interstitial pneumonitis by radiologic criteria. #2. Asymptomatic cholelithiasis. #3. Status post left cerebral CVA with right hemiparesis, currently undergoing occupational therapy and physical therapy. #4. Hypertension, controlled.   PLAN:  #1. The patient will into take a trial of Tarceva 50 mg daily. She was warned about diarrhea the major side effect and was told to stop the drug should any side effects occur that are troublesome and persistent. #2. Followup in 3 weeks with CBC.   All questions were answered. The patient knows to call the clinic with any problems, questions or concerns. We can certainly see the patient much sooner if necessary.   I spent 25 minutes counseling the patient face to face. The total time spent in the appointment was 30 minutes.    Doroteo Bradford, MD 06/04/2014 9:42 AM  DISCLAIMER:  This note was dictated with voice recognition software.  Similar sounding words can inadvertently be transcribed inaccurately and may not be corrected upon review.

## 2014-06-04 NOTE — Progress Notes (Signed)
LABS DRAWN FOR LDH,CMP,CBCD

## 2014-06-04 NOTE — Patient Instructions (Signed)
[facility] Discharge Instructions  RECOMMENDATIONS MADE BY THE CONSULTANT AND ANY TEST RESULTS WILL BE SENT TO YOUR REFERRING PHYSICIAN.  EXAM FINDINGS BY THE PHYSICIAN TODAY AND SIGNS OR SYMPTOMS TO REPORT TO CLINIC OR PRIMARY PHYSICIAN: Exam and findings as discussed by Dr. Barnet Glasgow.  MEDICATIONS PRESCRIBED:  Start Tarceva 50mg  daily  INSTRUCTIONS/FOLLOW-UP: Return in 3 weeks for lab work (CBC) and office visit.  Thank you for choosing [facility] to provide your oncology and hematology care.  To afford each patient quality time with our providers, please arrive at least 15 minutes before your scheduled appointment time.  With your help, our goal is to use those 15 minutes to complete the necessary work-up to ensure our physicians have the information they need to help with your evaluation and healthcare recommendations.    Effective January 1st, 2014, we ask that you re-schedule your appointment with our physicians should you arrive 10 or more minutes late for your appointment.  We strive to give you quality time with our providers, and arriving late affects you and other patients whose appointments are after yours.    Again, thank you for choosing [facility].  Our hope is that these requests will decrease the amount of time that you wait before being seen by our physicians.       _____________________________________________________________  Should you have questions after your visit to [facility], please contact our office at [phone] between the hours of 8:30 a.m. and 4:30 p.m.  Voicemails left after 4:30 p.m. will not be returned until the following business day.  For prescription refill requests, have your pharmacy contact our office with your prescription refill request.    _______________________________________________________________  We hope that we have given you very good care.  You may receive a patient  satisfaction survey in the mail, please complete it and return it as soon as possible.  We value your feedback!  _______________________________________________________________  Have you asked about our STAR program?  STAR stands for Survivorship Training and Rehabilitation, and this is a nationally recognized cancer care program that focuses on survivorship and rehabilitation.  Cancer and cancer treatments may cause problems, such as, pain, making you feel tired and keeping you from doing the things that you need or want to do. Cancer rehabilitation can help. Our goal is to reduce these troubling effects and help you have the best quality of life possible.  You may receive a survey from a nurse that asks questions about your current state of health.  Based on the survey results, all eligible patients will be referred to the [facility] program for an evaluation so we can better serve you!  A frequently asked questions sheet is available upon request.

## 2014-06-25 ENCOUNTER — Encounter (HOSPITAL_BASED_OUTPATIENT_CLINIC_OR_DEPARTMENT_OTHER): Payer: Medicare Other

## 2014-06-25 ENCOUNTER — Encounter (HOSPITAL_COMMUNITY): Payer: Self-pay

## 2014-06-25 VITALS — BP 136/47 | HR 49 | Temp 97.8°F | Resp 16 | Wt 150.2 lb

## 2014-06-25 DIAGNOSIS — C7800 Secondary malignant neoplasm of unspecified lung: Secondary | ICD-10-CM

## 2014-06-25 DIAGNOSIS — C3492 Malignant neoplasm of unspecified part of left bronchus or lung: Secondary | ICD-10-CM

## 2014-06-25 DIAGNOSIS — C3490 Malignant neoplasm of unspecified part of unspecified bronchus or lung: Secondary | ICD-10-CM

## 2014-06-25 DIAGNOSIS — C7801 Secondary malignant neoplasm of right lung: Secondary | ICD-10-CM

## 2014-06-25 LAB — CBC WITH DIFFERENTIAL/PLATELET
BASOS PCT: 1 % (ref 0–1)
Basophils Absolute: 0 10*3/uL (ref 0.0–0.1)
Eosinophils Absolute: 0.2 10*3/uL (ref 0.0–0.7)
Eosinophils Relative: 4 % (ref 0–5)
HCT: 38.4 % (ref 36.0–46.0)
HEMOGLOBIN: 13.2 g/dL (ref 12.0–15.0)
LYMPHS PCT: 17 % (ref 12–46)
Lymphs Abs: 0.9 10*3/uL (ref 0.7–4.0)
MCH: 33.3 pg (ref 26.0–34.0)
MCHC: 34.4 g/dL (ref 30.0–36.0)
MCV: 97 fL (ref 78.0–100.0)
MONOS PCT: 9 % (ref 3–12)
Monocytes Absolute: 0.5 10*3/uL (ref 0.1–1.0)
NEUTROS ABS: 3.6 10*3/uL (ref 1.7–7.7)
NEUTROS PCT: 69 % (ref 43–77)
Platelets: 152 10*3/uL (ref 150–400)
RBC: 3.96 MIL/uL (ref 3.87–5.11)
RDW: 12.2 % (ref 11.5–15.5)
WBC: 5.2 10*3/uL (ref 4.0–10.5)

## 2014-06-25 MED ORDER — DIPHENOXYLATE-ATROPINE 2.5-0.025 MG PO TABS: 1.0000 | ORAL_TABLET | Freq: Four times a day (QID) | ORAL | Status: DC | PRN

## 2014-06-25 MED ORDER — ERLOTINIB HCL 25 MG PO TABS: 25.0000 mg | ORAL_TABLET | Freq: Every day | ORAL | Status: DC

## 2014-06-25 NOTE — Patient Instructions (Signed)
[facility] Discharge Instructions  RECOMMENDATIONS MADE BY THE CONSULTANT AND ANY TEST RESULTS WILL BE SENT TO YOUR REFERRING PHYSICIAN.  EXAM FINDINGS BY THE PHYSICIAN TODAY AND SIGNS OR SYMPTOMS TO REPORT TO CLINIC OR PRIMARY PHYSICIAN: you seen Dr Barnet Glasgow today  MEDICATIONS PRESCRIBED:  Lomptil 2.5-0.025 mg 1 tablet 4 times a day as needed for diarrhea.  Use if have diarrhea couples days in a row. Tarceva 25 mg daily  Follow up in 1 month with doctor and lab work  Thank you for choosing [facility] to provide your oncology and hematology care.  To afford each patient quality time with our providers, please arrive at least 15 minutes before your scheduled appointment time.  With your help, our goal is to use those 15 minutes to complete the necessary work-up to ensure our physicians have the information they need to help with your evaluation and healthcare recommendations.    Effective January 1st, 2014, we ask that you re-schedule your appointment with our physicians should you arrive 10 or more minutes late for your appointment.  We strive to give you quality time with our providers, and arriving late affects you and other patients whose appointments are after yours.    Again, thank you for choosing [facility].  Our hope is that these requests will decrease the amount of time that you wait before being seen by our physicians.       _____________________________________________________________  Should you have questions after your visit to [facility], please contact our office at [phone] between the hours of 8:30 a.m. and 5:00 p.m.  Voicemails left after 4:30 p.m. will not be returned until the following business day.  For prescription refill requests, have your pharmacy contact our office with your prescription refill request.

## 2014-06-25 NOTE — Progress Notes (Signed)
[facility]  OFFICE PROGRESS NOTE  LUKING,SCOTT, MD [address] [place] [address]  DIAGNOSIS: Adenocarcinoma of left lung, stage 1 - Plan: CBC with Differential  Secondary malignant neoplasm of right lung  Chief Complaint  Patient presents with  . Lung carcinoma metastatic to lung    CURRENT THERAPY: Tarceva 25 mg daily.  INTERVAL HISTORY: Stephanie [name] 73 y.o. female returns for followup of adenocarcinoma of the lung, status post thoracoscopic left upper lobectomy and mediastinal lymph node dissection on 11/18/2011 for well-differentiated disease, exon 19 deletion positive, having received no postoperative therapy. It was recommended she try Tarceva because of changes on her chest CT scan but after reading the package insert, she decided not to take the medication. Justification for treatment or primarily rest upon symptomatology. She is status post CVA with speech difficulties. Three weeks ago she started on Tarceva 50 mg daily because of worsening findings on most recent CT scan performed on 05/26/2014. The patient had 3 episodes of diarrhea over the past 3 weeks. She denies any nausea, vomiting, melena, hematochezia, hematuria, or incontinence. She also denies any headache, skin rash, fever, or night sweats. Cough is minimal to none. Shortness of breath has improved.    MEDICAL HISTORY: Past Medical History  Diagnosis Date  . CVA (cerebral infarction)     speech difficulities  . Stroke   . Hypercholesteremia     Takes Zocor daily  . Hypertension     takes Atenolol and Lisinopril daily  . Lung mass     left upper lobe  . Arthritis     hands  . TIA (transient ischemic attack)     Sat before thanksgiving;impaired speech  . Pneumothorax   . Lung cancer     INTERIM HISTORY: has CVA (cerebral infarction); HTN (hypertension); Pneumothorax of left lung after biopsy; Adenocarcinoma of left lung, stage 1; Dysarthria as late  effect of stroke; Bilateral carotid artery disease; Other and unspecified hyperlipidemia; Lack of coordination; Muscle weakness (generalized); and Hyperlipidemia on her problem list.    ALLERGIES:  has No Known Allergies.  MEDICATIONS: has a current medication list which includes the following prescription(s): amlodipine, aspirin ec, atenolol, citalopram, erlotinib, prochlorperazine, simvastatin, and diphenoxylate-atropine.  SURGICAL HISTORY:  Past Surgical History  Procedure Laterality Date  . No past surgeries    . Lung biopsy  10/13/11    left  . Abdominal hysterectomy  1975  . Varicose vein surgery  3yr ago  . Video assisted thoracoscopy  11/18/2011    Procedure: VIDEO ASSISTED THORACOSCOPY;  Surgeon: SMelrose Nakayama MD;  Location[facility]  Service: Thoracic;  Laterality: Left;  . Lobectomy  11/18/2011    Procedure: LOBECTOMY;  Surgeon: SMelrose Nakayama MD;  [facility]  Service: Thoracic;  Laterality: Left;  LEFT UPPER LOBECTOMY  . Breast biopsy  early 2000    FAMILY HISTORY: family history includes Cancer in her brother; Diabetes in her brother; Stroke in her brother, mother, and sister. There is no history of Anesthesia problems, Hypotension, Malignant hyperthermia, or Pseudochol deficiency.  SOCIAL HISTORY:  reports that she has never smoked. She has never used smokeless tobacco. She reports that she does not drink alcohol or use illicit drugs.  REVIEW OF SYSTEMS:  Other than that discussed above is noncontributory.  PHYSICAL EXAMINATION: ECOG PERFORMANCE STATUS: 1 - Symptomatic but completely ambulatory  Blood pressure 136/47, pulse 49, temperature 97.8 F (36.6 C), temperature  source Oral, resp. rate 16, weight 150 lb 3.2 oz (68.13 kg), SpO2 100.00%.  GENERAL:alert, no distress and comfortable SKIN: skin color, texture, turgor are normal, no rashes or significant lesions EYES: PERLA; Conjunctiva are pink and non-injected, sclera clear SINUSES: No redness  or tenderness over maxillary or ethmoid sinuses OROPHARYNX:no exudate, no erythema on lips, buccal mucosa, or tongue. NECK: supple, thyroid normal size, non-tender, without nodularity. No masses CHEST: Normal AP diameter with no breast masses. LYMPH:  no palpable lymphadenopathy in the cervical, axillary or inguinal LUNGS: clear to auscultation and percussion with normal breathing effort HEART: regular rate & rhythm and no murmurs. ABDOMEN:abdomen soft, non-tender and normal bowel sounds MUSCULOSKELETAL:no cyanosis of digits and no clubbing. Range of motion normal.  NEURO: alert & oriented x 3 with fluent speech, no focal motor/sensory deficits. Right hemiparesis.   LABORATORY DATA: Lab on 06/25/2014  Component Date Value Ref Range Status  . WBC 06/25/2014 5.2  4.0 - 10.5 K/uL Final  . RBC 06/25/2014 3.96  3.87 - 5.11 MIL/uL Final  . Hemoglobin 06/25/2014 13.2  12.0 - 15.0 g/dL Final  . HCT 06/25/2014 38.4  36.0 - 46.0 % Final  . MCV 06/25/2014 97.0  78.0 - 100.0 fL Final  . MCH 06/25/2014 33.3  26.0 - 34.0 pg Final  . MCHC 06/25/2014 34.4  30.0 - 36.0 g/dL Final  . RDW 06/25/2014 12.2  11.5 - 15.5 % Final  . Platelets 06/25/2014 152  150 - 400 K/uL Final  . Neutrophils Relative % 06/25/2014 69  43 - 77 % Final  . Neutro Abs 06/25/2014 3.6  1.7 - 7.7 K/uL Final  . Lymphocytes Relative 06/25/2014 17  12 - 46 % Final  . Lymphs Abs 06/25/2014 0.9  0.7 - 4.0 K/uL Final  . Monocytes Relative 06/25/2014 9  3 - 12 % Final  . Monocytes Absolute 06/25/2014 0.5  0.1 - 1.0 K/uL Final  . Eosinophils Relative 06/25/2014 4  0 - 5 % Final  . Eosinophils Absolute 06/25/2014 0.2  0.0 - 0.7 K/uL Final  . Basophils Relative 06/25/2014 1  0 - 1 % Final  . Basophils Absolute 06/25/2014 0.0  0.0 - 0.1 K/uL Final    PATHOLOGY: EGFR mutated adenocarcinoma  Urinalysis    Component Value Date/Time   COLORURINE YELLOW 11/16/2011 1600   APPEARANCEUR CLEAR 11/16/2011 1600   LABSPEC 1.021 11/16/2011 1600     PHURINE 5.0 11/16/2011 1600   GLUCOSEU NEGATIVE 11/16/2011 1600   HGBUR MODERATE* 11/16/2011 1600   BILIRUBINUR NEGATIVE 11/16/2011 1600   KETONESUR NEGATIVE 11/16/2011 1600   PROTEINUR 30* 11/16/2011 1600   UROBILINOGEN 1.0 11/16/2011 1600   NITRITE NEGATIVE 11/16/2011 1600   LEUKOCYTESUR NEGATIVE 11/16/2011 1600    RADIOGRAPHIC STUDIES: Ct Chest Wo Contrast  05/26/2014   CLINICAL DATA:  Followup stage I lung cancer.  EXAM: CT CHEST WITHOUT CONTRAST  TECHNIQUE: Multidetector CT imaging of the chest was performed following the standard protocol without IV contrast.  COMPARISON:  02/12/2014, 01/14/2013 and 07/16/2012.  FINDINGS: Mediastinal lymph nodes are not enlarged by CT size criteria. Hilar regions are difficult to definitively evaluate without IV contrast. No axillary adenopathy. Atherosclerotic calcification of the arterial vasculature, including extensive three-vessel involvement of the coronary arteries. Dense mitral annulus calcification. Heart is mildly enlarged. No pericardial effusion.  Biapical pleural parenchymal scarring. Reticular coarsening and subpleural reticulation are seen without a zonal predominance. Associated traction bronchiectasis/bronchiolectasis. Findings do not appear progressive from 07/16/2012. No definite honeycombing.  There are several  nodules in the right lung which are new or enlarging from 07/16/2012. Index 10 mm nodule in the apical segment right upper lobe (series 3, image 19) previously measured 4 mm on 07/16/2012. Nodular lesion in the right lower lobe measures 1.4 x 1.7 cm (image 36), stable from 07/16/2012. Postoperative changes in the left hemithorax with associated volume loss. No pleural fluid. Airway is otherwise unremarkable.  Incidental imaging of the upper abdomen shows the visualized portion of the liver to be grossly unremarkable. Stone debris layers in the gallbladder. Visualized portions of the adrenal glands are unremarkable. Possible tiny stones in the  right kidney. Visualized portions of the kidneys and spleen are otherwise unremarkable. Difficult to exclude small calcified splenic artery aneurysms in the splenic hilum (series 2, image 51). Visualized portions of the pancreas, stomach and bowel are otherwise grossly unremarkable. No upper abdominal adenopathy. No worrisome lytic or sclerotic lesions. Degenerative changes are seen throughout the spine.  IMPRESSION: 1. Right lung nodules are grossly stable from 02/12/2014 but appear new/enlarged from 07/16/2012, raising concern for bronchogenic carcinoma. 2. 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 3. Three-vessel coronary artery calcification. 4. Cholelithiasis. 5. Possible calcified splenic artery aneurysms.   Electronically Signed   By: Lorin Picket M.D.   On: 05/26/2014 10:58    ASSESSMENT:  #1. 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  #2. Asymptomatic cholelithiasis.  #3. Status post left cerebral CVA with right hemiparesis, currently undergoing occupational therapy and physical therapy.  #4. Hypertension, controlled. #5. Minimal diarrhea probably secondary to Tarceva.    PLAN:  #1. Continue Tarceva 25 mg daily. #2. [name] if diarrhea becomes daily. #3. Followup in 4 weeks with CBC.   All questions were answered. The patient knows to call the clinic with any problems, questions or concerns. We can certainly see the patient much sooner if necessary.   I spent 25 minutes counseling the patient face to face. The total time spent in the appointment was 30 minutes.    Doroteo Bradford, MD 06/25/2014 9:09 AM  DISCLAIMER:  This note was dictated with voice recognition software.  Similar sounding words can  inadvertently be transcribed inaccurately and may not be corrected upon review.

## 2014-06-25 NOTE — Progress Notes (Signed)
Labs drawn for cbcd

## 2014-07-28 ENCOUNTER — Encounter (HOSPITAL_COMMUNITY): Payer: Medicare Other | Attending: Hematology and Oncology

## 2014-07-28 ENCOUNTER — Encounter (HOSPITAL_COMMUNITY): Payer: Medicare Other

## 2014-07-28 ENCOUNTER — Encounter (HOSPITAL_COMMUNITY): Payer: Self-pay

## 2014-07-28 ENCOUNTER — Other Ambulatory Visit: Payer: Self-pay | Admitting: Family Medicine

## 2014-07-28 VITALS — BP 124/64 | HR 51 | Temp 98.0°F | Resp 18 | Wt 151.0 lb

## 2014-07-28 DIAGNOSIS — C3492 Malignant neoplasm of unspecified part of left bronchus or lung: Secondary | ICD-10-CM

## 2014-07-28 DIAGNOSIS — C3490 Malignant neoplasm of unspecified part of unspecified bronchus or lung: Secondary | ICD-10-CM | POA: Diagnosis present

## 2014-07-28 DIAGNOSIS — K8020 Calculus of gallbladder without cholecystitis without obstruction: Secondary | ICD-10-CM

## 2014-07-28 DIAGNOSIS — R197 Diarrhea, unspecified: Secondary | ICD-10-CM

## 2014-07-28 DIAGNOSIS — C7801 Secondary malignant neoplasm of right lung: Secondary | ICD-10-CM

## 2014-07-28 DIAGNOSIS — C7800 Secondary malignant neoplasm of unspecified lung: Secondary | ICD-10-CM

## 2014-07-28 DIAGNOSIS — R21 Rash and other nonspecific skin eruption: Secondary | ICD-10-CM

## 2014-07-28 DIAGNOSIS — I63322 Cerebral infarction due to thrombosis of left anterior cerebral artery: Secondary | ICD-10-CM

## 2014-07-28 DIAGNOSIS — Z23 Encounter for immunization: Secondary | ICD-10-CM

## 2014-07-28 DIAGNOSIS — Z139 Encounter for screening, unspecified: Secondary | ICD-10-CM

## 2014-07-28 LAB — COMPREHENSIVE METABOLIC PANEL
ALK PHOS: 67 U/L (ref 39–117)
ALT: 9 U/L (ref 0–35)
ANION GAP: 10 (ref 5–15)
AST: 21 U/L (ref 0–37)
Albumin: 3.4 g/dL — ABNORMAL LOW (ref 3.5–5.2)
BILIRUBIN TOTAL: 0.6 mg/dL (ref 0.3–1.2)
BUN: 16 mg/dL (ref 6–23)
CHLORIDE: 104 meq/L (ref 96–112)
CO2: 27 mEq/L (ref 19–32)
Calcium: 9.4 mg/dL (ref 8.4–10.5)
Creatinine, Ser: 1.36 mg/dL — ABNORMAL HIGH (ref 0.50–1.10)
GFR calc non Af Amer: 38 mL/min — ABNORMAL LOW (ref >90)
GFR, EST AFRICAN AMERICAN: 44 mL/min — AB (ref >90)
GLUCOSE: 106 mg/dL — AB (ref 70–99)
Potassium: 3.9 mEq/L (ref 3.7–5.3)
Sodium: 141 mEq/L (ref 137–147)
TOTAL PROTEIN: 7.2 g/dL (ref 6.0–8.3)

## 2014-07-28 LAB — CBC WITH DIFFERENTIAL/PLATELET
Basophils Absolute: 0 10*3/uL (ref 0.0–0.1)
Basophils Relative: 1 % (ref 0–1)
EOS ABS: 0.2 10*3/uL (ref 0.0–0.7)
Eosinophils Relative: 3 % (ref 0–5)
HEMATOCRIT: 39.2 % (ref 36.0–46.0)
HEMOGLOBIN: 13.4 g/dL (ref 12.0–15.0)
LYMPHS ABS: 1 10*3/uL (ref 0.7–4.0)
Lymphocytes Relative: 19 % (ref 12–46)
MCH: 33.2 pg (ref 26.0–34.0)
MCHC: 34.2 g/dL (ref 30.0–36.0)
MCV: 97 fL (ref 78.0–100.0)
MONOS PCT: 10 % (ref 3–12)
Monocytes Absolute: 0.5 10*3/uL (ref 0.1–1.0)
Neutro Abs: 3.5 10*3/uL (ref 1.7–7.7)
Neutrophils Relative %: 67 % (ref 43–77)
Platelets: 153 10*3/uL (ref 150–400)
RBC: 4.04 MIL/uL (ref 3.87–5.11)
RDW: 12.5 % (ref 11.5–15.5)
WBC: 5.2 10*3/uL (ref 4.0–10.5)

## 2014-07-28 MED ORDER — INFLUENZA VAC SPLIT QUAD 0.5 ML IM SUSY
0.5000 mL | PREFILLED_SYRINGE | Freq: Once | INTRAMUSCULAR | Status: AC
  Administered 2014-07-28: 0.5 mL via INTRAMUSCULAR
  Filled 2014-07-28: qty 0.5

## 2014-07-28 NOTE — Progress Notes (Signed)
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 EGFR mutated lung cancer    CURRENT THERAPY: Tarceva 25 mg daily  INTERVAL HISTORY: Stephanie [name] 73 y.o. female returns for followup of adenocarcinoma of the lung, status post thoracoscopic left upper lobectomy and mediastinal lymph node dissection on 11/18/2011 for well-differentiated disease, exon 19 deletion positive, having received no postoperative therapy. It was recommended she try Tarceva because of changes on her chest CT scan but after reading the package insert, she decided not to take the medication. Justification for treatment or primarily rest upon symptomatology. She is status post CVA with speech difficulties.  On 06/04/2014 she was started on Tarceva 25 mg daily because of worsening findings on most recent CT scan performed on 05/26/2014. She did have one day of diarrhea yesterday going twice, watery, no bleeding. No worsening cough or wheezing. No sore mouth but with minor skin rash on the face. No lower extremity swelling or redness, worsening right-sided weakness, headache, PND, orthopnea, palpitations, chest pain, abdominal distention, muscle cramps, joint pain, or seizures.    MEDICAL HISTORY: Past Medical History  Diagnosis Date  . CVA (cerebral infarction)     speech difficulities  . Stroke   . Hypercholesteremia     Takes Zocor daily  . Hypertension     takes Atenolol and Lisinopril daily  . Lung mass     left upper lobe  . Arthritis     hands  . TIA (transient ischemic attack)     Sat before thanksgiving;impaired speech  . Pneumothorax   . Lung cancer     INTERIM HISTORY: has  CVA (cerebral infarction); HTN (hypertension); Pneumothorax of left lung after biopsy; Adenocarcinoma of left lung, stage 1; Dysarthria as late effect of stroke; Bilateral carotid artery disease; Other and unspecified hyperlipidemia; Lack of coordination; Muscle weakness (generalized); and Hyperlipidemia on her problem list.    ALLERGIES:  has No Known Allergies.  MEDICATIONS: has a current medication list which includes the following prescription(s): amlodipine, aspirin ec, atenolol, citalopram, erlotinib, prochlorperazine, simvastatin, and diphenoxylate-atropine.  SURGICAL HISTORY:  Past Surgical History  Procedure Laterality Date  . No past surgeries    . Lung biopsy  10/13/11    left  . Abdominal hysterectomy  1975  . Varicose vein surgery  46yr ago  . Video assisted thoracoscopy  11/18/2011    Procedure: VIDEO ASSISTED THORACOSCOPY;  Surgeon: SMelrose Nakayama MD;  Location[facility]  Service: Thoracic;  Laterality: Left;  . Lobectomy  11/18/2011    Procedure: LOBECTOMY;  Surgeon: SMelrose Nakayama MD;  Location: [facility]  Service: Thoracic;  Laterality: Left;  LEFT UPPER LOBECTOMY  . Breast biopsy  early 2000    FAMILY HISTORY: family history includes Cancer in her brother; Diabetes in her brother; Stroke in her brother, mother, and sister. There is no history of Anesthesia problems, Hypotension, Malignant hyperthermia, or Pseudochol deficiency.  SOCIAL HISTORY:  reports that she has never smoked. She has never used smokeless tobacco. She reports that she does not drink alcohol or use illicit drugs.  REVIEW OF SYSTEMS:  Other than that discussed above is noncontributory.  PHYSICAL EXAMINATION: ECOG PERFORMANCE STATUS:  2 - Symptomatic, <50% confined to bed  Blood pressure 124/64, pulse 51, temperature 98 F (36.7 C), temperature source Oral, resp. rate 18, weight 151 lb (68.493 kg), SpO2 100.00%.  GENERAL:alert, no distress and comfortable SKIN: skin color, texture, turgor  are normal, no rashes or significant lesions. Faint acneiform rash on the face. EYES: PERLA; Conjunctiva are pink and non-injected, sclera clear SINUSES: No redness or tenderness over maxillary or ethmoid sinuses OROPHARYNX:no exudate, no erythema on lips, buccal mucosa, or tongue. NECK: supple, thyroid normal size, non-tender, without nodularity. No masses CHEST: Normal AP diameter with no breast masses. Right anterior chest nevus measuring 0.5 cm in size LYMPH:  no palpable lymphadenopathy in the cervical, axillary or inguinal LUNGS: clear to auscultation and percussion with normal breathing effort HEART: regular rate & rhythm and no murmurs. ABDOMEN:abdomen soft, non-tender and normal bowel sounds MUSCULOSKELETAL:no cyanosis of digits and no clubbing. Range of motion normal.  NEURO: alert & oriented x 3 with fluent speech, right 3 over 5 hemiparesis   LABORATORY DATA: Appointment on 07/28/2014  Component Date Value Ref Range Status  . WBC 07/28/2014 5.2  4.0 - 10.5 K/uL Final  . RBC 07/28/2014 4.04  3.87 - 5.11 MIL/uL Final  . Hemoglobin 07/28/2014 13.4  12.0 - 15.0 g/dL Final  . HCT 07/28/2014 39.2  36.0 - 46.0 % Final  . MCV 07/28/2014 97.0  78.0 - 100.0 fL Final  . MCH 07/28/2014 33.2  26.0 - 34.0 pg Final  . MCHC 07/28/2014 34.2  30.0 - 36.0 g/dL Final  . RDW 07/28/2014 12.5  11.5 - 15.5 % Final  . Platelets 07/28/2014 153  150 - 400 K/uL Final  . Neutrophils Relative % 07/28/2014 67  43 - 77 % Final  . Neutro Abs 07/28/2014 3.5  1.7 - 7.7 K/uL Final  . Lymphocytes Relative 07/28/2014 19  12 - 46 % Final  . Lymphs Abs 07/28/2014 1.0  0.7 - 4.0 K/uL Final  . Monocytes Relative 07/28/2014 10  3 - 12 % Final  . Monocytes Absolute 07/28/2014 0.5  0.1 - 1.0 K/uL Final  . Eosinophils Relative 07/28/2014 3  0 - 5 % Final  . Eosinophils Absolute 07/28/2014 0.2  0.0 - 0.7 K/uL Final  . Basophils Relative 07/28/2014 1  0 - 1 % Final  . Basophils Absolute 07/28/2014 0.0  0.0 - 0.1 K/uL  Final    PATHOLOGY: EGFR mutated adenocarcinoma  Urinalysis    Component Value Date/Time   COLORURINE YELLOW 11/16/2011 1600   APPEARANCEUR CLEAR 11/16/2011 1600   LABSPEC 1.021 11/16/2011 1600   PHURINE 5.0 11/16/2011 1600   GLUCOSEU NEGATIVE 11/16/2011 1600   HGBUR MODERATE* 11/16/2011 1600   BILIRUBINUR NEGATIVE 11/16/2011 1600   KETONESUR NEGATIVE 11/16/2011 1600   PROTEINUR 30* 11/16/2011 1600   UROBILINOGEN 1.0 11/16/2011 1600   NITRITE NEGATIVE 11/16/2011 1600   LEUKOCYTESUR NEGATIVE 11/16/2011 1600    RADIOGRAPHIC STUDIES: No results found.  ASSESSMENT:  #1. 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  #2. Asymptomatic cholelithiasis.  #3. Status post left cerebral CVA with right hemiparesis, currently undergoing occupational therapy and physical therapy.  #4. Hypertension, controlled.  #5. Minimal diarrhea probably secondary to Tarceva.       PLAN:  #1. Influenza virus vaccine given today. #2. Continue Tarceva 25 mg daily. #3. Use [name] if diarrhea occurs and notify the office  if severe or skin rash worsens. #4. Followup in one month with CBC, chem profile, LDH.   All questions were answered. The patient knows to call the clinic with any problems, questions or concerns. We can certainly see the patient much sooner if necessary.   I spent 25 minutes counseling the patient face to face. The total time spent in the appointment was 30 minutes.    Doroteo Bradford, MD 07/28/2014 9:39 AM  DISCLAIMER:  This note was dictated with voice recognition software.  Similar sounding words can inadvertently be transcribed inaccurately and may not be corrected upon review.

## 2014-07-28 NOTE — Progress Notes (Signed)
Stephanie [name] presents today for injection per the provider's orders.  Fluvarix administration without incident; see MAR for injection details.  Patient tolerated procedure well and without incident.  No questions or complaints noted at this time.

## 2014-07-28 NOTE — Progress Notes (Signed)
LABS FOR CMP,CBCD

## 2014-07-28 NOTE — Patient Instructions (Signed)
[facility] Discharge Instructions  RECOMMENDATIONS MADE BY THE CONSULTANT AND ANY TEST RESULTS WILL BE SENT TO YOUR REFERRING PHYSICIAN.  Influenza vaccine given today. Return in one month for lab work and office visit.  Thank you for choosing [facility] to provide your oncology and hematology care.  To afford each patient quality time with our providers, please arrive at least 15 minutes before your scheduled appointment time.  With your help, our goal is to use those 15 minutes to complete the necessary work-up to ensure our physicians have the information they need to help with your evaluation and healthcare recommendations.    Effective January 1st, 2014, we ask that you re-schedule your appointment with our physicians should you arrive 10 or more minutes late for your appointment.  We strive to give you quality time with our providers, and arriving late affects you and other patients whose appointments are after yours.    Again, thank you for choosing [facility].  Our hope is that these requests will decrease the amount of time that you wait before being seen by our physicians.       _____________________________________________________________  Should you have questions after your visit to [facility], please contact our office at [phone] between the hours of 8:30 a.m. and 4:30 p.m.  Voicemails left after 4:30 p.m. will not be returned until the following business day.  For prescription refill requests, have your pharmacy contact our office with your prescription refill request.    _______________________________________________________________  We hope that we have given you very good care.  You may receive a patient satisfaction survey in the mail, please complete it and return it as soon as possible.  We value your feedback!  _______________________________________________________________  Have you asked about our  STAR program?  STAR stands for Survivorship Training and Rehabilitation, and this is a nationally recognized cancer care program that focuses on survivorship and rehabilitation.  Cancer and cancer treatments may cause problems, such as, pain, making you feel tired and keeping you from doing the things that you need or want to do. Cancer rehabilitation can help. Our goal is to reduce these troubling effects and help you have the best quality of life possible.  You may receive a survey from a nurse that asks questions about your current state of health.  Based on the survey results, all eligible patients will be referred to the [facility] program for an evaluation so we can better serve you!  A frequently asked questions sheet is available upon request.

## 2014-08-19 ENCOUNTER — Other Ambulatory Visit (HOSPITAL_COMMUNITY): Payer: Self-pay

## 2014-08-19 DIAGNOSIS — C3492 Malignant neoplasm of unspecified part of left bronchus or lung: Secondary | ICD-10-CM

## 2014-08-20 ENCOUNTER — Ambulatory Visit: Payer: Medicare Other | Admitting: Family Medicine

## 2014-08-23 ENCOUNTER — Encounter (HOSPITAL_COMMUNITY): Payer: Self-pay | Admitting: Emergency Medicine

## 2014-08-23 ENCOUNTER — Emergency Department (HOSPITAL_COMMUNITY): Payer: Medicare Other

## 2014-08-23 ENCOUNTER — Emergency Department (HOSPITAL_COMMUNITY)
Admission: EM | Admit: 2014-08-23 | Discharge: 2014-08-23 | Disposition: A | Discharge status: Home / Self Care | Payer: Medicare Other | Attending: Emergency Medicine | Admitting: Emergency Medicine

## 2014-08-23 DIAGNOSIS — Z7982 Long term (current) use of aspirin: Secondary | ICD-10-CM | POA: Insufficient documentation

## 2014-08-23 DIAGNOSIS — M549 Dorsalgia, unspecified: Secondary | ICD-10-CM

## 2014-08-23 DIAGNOSIS — E780 Pure hypercholesterolemia: Secondary | ICD-10-CM | POA: Diagnosis not present

## 2014-08-23 DIAGNOSIS — Z902 Acquired absence of lung [part of]: Secondary | ICD-10-CM | POA: Insufficient documentation

## 2014-08-23 DIAGNOSIS — I10 Essential (primary) hypertension: Secondary | ICD-10-CM | POA: Insufficient documentation

## 2014-08-23 DIAGNOSIS — Z79899 Other long term (current) drug therapy: Secondary | ICD-10-CM | POA: Insufficient documentation

## 2014-08-23 DIAGNOSIS — M12842 Other specific arthropathies, not elsewhere classified, left hand: Secondary | ICD-10-CM | POA: Insufficient documentation

## 2014-08-23 DIAGNOSIS — Z9889 Other specified postprocedural states: Secondary | ICD-10-CM | POA: Diagnosis not present

## 2014-08-23 DIAGNOSIS — M8448XA Pathological fracture, other site, initial encounter for fracture: Secondary | ICD-10-CM | POA: Diagnosis not present

## 2014-08-23 DIAGNOSIS — Z8709 Personal history of other diseases of the respiratory system: Secondary | ICD-10-CM | POA: Insufficient documentation

## 2014-08-23 DIAGNOSIS — Z8673 Personal history of transient ischemic attack (TIA), and cerebral infarction without residual deficits: Secondary | ICD-10-CM | POA: Insufficient documentation

## 2014-08-23 DIAGNOSIS — M12841 Other specific arthropathies, not elsewhere classified, right hand: Secondary | ICD-10-CM | POA: Insufficient documentation

## 2014-08-23 DIAGNOSIS — C3492 Malignant neoplasm of unspecified part of left bronchus or lung: Secondary | ICD-10-CM | POA: Diagnosis not present

## 2014-08-23 DIAGNOSIS — M545 Low back pain, unspecified: Secondary | ICD-10-CM

## 2014-08-23 NOTE — Discharge Instructions (Signed)
A follow-up with your cancer doctor as scheduled. Return for any new or worse symptoms typically develop a fever or belly pain or nausea and vomiting. X-rays of the back show significant degenerative changes. And an old compression fracture of the first lumbar vertebrae. No acute fractures.

## 2014-08-23 NOTE — ED Provider Notes (Signed)
CSN: [id number]     Arrival date & time 08/23/14  1028 History  This chart was scribed for Fredia Sorrow, MD by Marlowe Kays, ED Scribe. This patient was seen in room APA07/APA07 and the patient's care was started at 12:52 PM.  Chief Complaint  Patient presents with  . Back Pain   Patient is a 73 y.o. female presenting with back pain. The history is provided by the patient. No language interpreter was used.  Back Pain Location:  Lumbar spine Quality:  Unable to specify Radiates to:  Does not radiate Pain severity:  Unable to specify Pain is:  Unable to specify Onset quality:  Unable to specify Duration:  3 days Timing:  Constant Progression:  Worsening Chronicity:  Recurrent Context: not falling, not occupational injury and not recent injury   Worsened by:  Nothing tried Ineffective treatments:  None tried Risk factors: hx of cancer   Risk factors: not obese    LEVEL 5 CAVEAT- Full history could not be obtained due to difficulty speaking secondary to stroke.  HPI Comments:  Stephanie [name] is a 72 y.o. female with PMHx of CVA, hypercholesteremia, HTN, TIA and lung cancer who presents to the Emergency Department complaining of severe low back pain that began 2-3 days ago. She states the pain began to worsen today. She has had speech difficulty for the past several years secondary to previous stroke. She denies hip pain. She denies trauma, injury or fall.  PCP- Dr. Wolfgang Phoenix  Past Medical History  Diagnosis Date  . CVA (cerebral infarction)     speech difficulities  . Stroke   . Hypercholesteremia     Takes Zocor daily  . Hypertension     takes Atenolol and Lisinopril daily  . Lung mass     left upper lobe  . Arthritis     hands  . TIA (transient ischemic attack)     Sat before thanksgiving;impaired speech  . Pneumothorax   . Lung cancer    Past Surgical History  Procedure Laterality Date  . No past surgeries    . Lung biopsy  10/13/11    left  . Abdominal  hysterectomy  1975  . Varicose vein surgery  68yrs ago  . Video assisted thoracoscopy  11/18/2011    Procedure: VIDEO ASSISTED THORACOSCOPY;  Surgeon: Melrose Nakayama, MD;  Location: [facility];  Service: Thoracic;  Laterality: Left;  . Lobectomy  11/18/2011    Procedure: LOBECTOMY;  Surgeon: Melrose Nakayama, MD;  Location: [facility];  Service: Thoracic;  Laterality: Left;  LEFT UPPER LOBECTOMY  . Breast biopsy  early 2000   Family History  Problem Relation Age of Onset  . Stroke Mother   . Stroke Sister   . Anesthesia problems Neg Hx   . Hypotension Neg Hx   . Malignant hyperthermia Neg Hx   . Pseudochol deficiency Neg Hx   . Diabetes Brother   . Cancer Brother   . Stroke Brother    History  Substance Use Topics  . Smoking status: Never Smoker   . Smokeless tobacco: Never Used  . Alcohol Use: No   OB History   Grav Para Term Preterm Abortions TAB SAB Ect Mult Living                 Review of Systems  Musculoskeletal: Positive for back pain.   LEVEL 5 CAVEAT- Full history could not be obtained due to difficulty speaking secondary to stroke.  Allergies  Review of  patient's allergies indicates no known allergies.  Home Medications   Prior to Admission medications   Medication Sig Start Date End Date Taking? Authorizing Provider  amLODipine (NORVASC) 5 MG tablet Take 1 tablet (5 mg total) by mouth daily. 05/20/14   Kathyrn Drown, MD  aspirin EC 81 MG tablet Take 81 mg by mouth every morning.    Historical Provider, MD  atenolol (TENORMIN) 50 MG tablet Take 1 tablet (50 mg total) by mouth 1 day or 1 dose. 05/20/14   Kathyrn Drown, MD  citalopram (CELEXA) 20 MG tablet Take 40 mg by mouth daily. 05/20/14 05/20/15  Kathyrn Drown, MD  diphenoxylate-atropine ([name]) 2.5-0.025 MG per tablet Take 1 tablet by mouth 4 (four) times daily as needed for diarrhea or loose stools. 06/25/14   Farrel Gobble, MD  erlotinib (TARCEVA) 25 MG tablet Take 1 tablet (25 mg total) by mouth  daily. Take on an empty stomach 1 hour before meals or 2 hours after. 06/25/14   Farrel Gobble, MD  prochlorperazine (COMPAZINE) 10 MG tablet Take 1 tablet (10 mg total) by mouth every 6 (six) hours as needed (nausea/vomiting). 08/12/13   Farrel Gobble, MD  simvastatin (ZOCOR) 20 MG tablet Take 1 tablet (20 mg total) by mouth daily. 05/20/14   Kathyrn Drown, MD   Triage Vitals: BP 114/100  Pulse 124  Temp(Src) 98.2 F (36.8 C) (Oral)  Resp 16  SpO2 96% Physical Exam  Nursing note and vitals reviewed. Constitutional: She is oriented to person, place, and time. She appears well-developed and well-nourished.  HENT:  Head: Normocephalic and atraumatic.  Eyes: EOM are normal.  Neck: Normal range of motion.  Cardiovascular: Normal rate, regular rhythm and normal heart sounds.   No murmur heard. Pulmonary/Chest: Effort normal and breath sounds normal. No respiratory distress. She has no wheezes. She has no rales.  Abdominal: Soft. Bowel sounds are normal. There is no tenderness.  Musculoskeletal: Normal range of motion.  No bruising or tenderness to palpation.   Neurological: She is alert and oriented to person, place, and time. No cranial nerve deficit. Coordination normal.  Unable to extend tongue out of mouth. Moving tongue within mouth.  Skin: Skin is warm and dry.  Psychiatric: She has a normal mood and affect. Her behavior is normal.    ED Course  Procedures (including critical care time) DIAGNOSTIC STUDIES: Oxygen Saturation is 96% on RA, adequate by my interpretation.   COORDINATION OF CARE: 12:55 PM- Will X-Ray L-spine. Pt verbalizes understanding and agrees to plan.  Medications - No data to display  Labs Review Labs Reviewed - No data to display  Imaging Review Dg Lumbar Spine Complete  08/23/2014   CLINICAL DATA:  Worsening low back pain for a couple days, no known injury, history lung cancer post surgery, stroke, hypertension, hypercholesterolemia  EXAM: LUMBAR  SPINE - COMPLETE 4+ VIEW  COMPARISON:  CT abdomen and pelvis 02/12/2014  FINDINGS: Five non-rib-bearing lumbar vertebrae.  Diffuse osseous demineralization.  Mild biconvex scoliosis.  Multilevel disc space narrowing and endplate spur formation greatest at L1-L2.  Superior endplate height loss at L1 unchanged from prior CT.  No acute fracture, subluxation or bone destruction.  Facet degenerative changes lower lumbar spine.  Scattered atherosclerotic calcifications.  SI joints preserved.  IMPRESSION: Osseous demineralization with biconvex thoracolumbar scoliosis and multilevel degenerative disc and facet disease changes.  Chronic superior endplate compression deformity of L1 vertebral body.  No acute lumbar spine abnormalities.   Electronically Signed  By: Lavonia Dana M.D.   On: 08/23/2014 14:00     EKG Interpretation None      MDM   Final diagnoses:  Back pain  Bilateral low back pain without sciatica    X-rays of the lumbar back shows significant degenerative changes and a chronic L1 compression fracture or endplate compression fracture. No acute injuries. Patient without a neuro deficits in the lower extremities. Patient has pre-existing difficulty speaking due to previous strokes none of that is new. Family and discussion was not overly concerned about a urinary tract infection. Patient's primary caregiver arrived and felt that that was not of concern. To that was canceled. Patient has follow-up with her cancer doctor they have already ordered a bone scan for her. This will help delineate the complaint further. Patient currently moving around quite well does not appear to need any additional pain medicine than which is treated with already.  I personally performed the services described in this documentation, which was scribed in my presence. The recorded information has been reviewed and is accurate.    Fredia Sorrow, MD 08/23/14 [phone]

## 2014-08-23 NOTE — ED Notes (Addendum)
Pt. C/o low back pain x 3 days.  Pt. States pain has been getting worse.  Pt. Family denies falling.

## 2014-08-23 NOTE — ED Notes (Signed)
Pt c/o lower back pain x a few days. H/s stroke, limited communication.

## 2014-08-27 ENCOUNTER — Ambulatory Visit (INDEPENDENT_AMBULATORY_CARE_PROVIDER_SITE_OTHER): Payer: Medicare Other | Admitting: Family Medicine

## 2014-08-27 ENCOUNTER — Encounter: Payer: Self-pay | Admitting: Family Medicine

## 2014-08-27 ENCOUNTER — Encounter (HOSPITAL_BASED_OUTPATIENT_CLINIC_OR_DEPARTMENT_OTHER): Payer: Medicare Other

## 2014-08-27 ENCOUNTER — Encounter (HOSPITAL_COMMUNITY): Payer: Self-pay

## 2014-08-27 ENCOUNTER — Encounter (HOSPITAL_COMMUNITY): Payer: Medicare Other | Attending: Hematology and Oncology

## 2014-08-27 ENCOUNTER — Ambulatory Visit (HOSPITAL_COMMUNITY)
Admission: RE | Admit: 2014-08-27 | Discharge: 2014-08-27 | Disposition: A | Discharge status: Home / Self Care | Payer: Medicare Other | Source: Ambulatory Visit | Attending: Family Medicine | Admitting: Family Medicine

## 2014-08-27 VITALS — BP 143/67 | HR 55 | Temp 98.0°F | Resp 18 | Wt 155.1 lb

## 2014-08-27 VITALS — BP 130/86 | Ht 67.0 in | Wt 156.0 lb

## 2014-08-27 DIAGNOSIS — C3492 Malignant neoplasm of unspecified part of left bronchus or lung: Secondary | ICD-10-CM | POA: Diagnosis present

## 2014-08-27 DIAGNOSIS — C3412 Malignant neoplasm of upper lobe, left bronchus or lung: Secondary | ICD-10-CM

## 2014-08-27 DIAGNOSIS — M859 Disorder of bone density and structure, unspecified: Secondary | ICD-10-CM

## 2014-08-27 DIAGNOSIS — C801 Malignant (primary) neoplasm, unspecified: Secondary | ICD-10-CM | POA: Insufficient documentation

## 2014-08-27 DIAGNOSIS — Z1231 Encounter for screening mammogram for malignant neoplasm of breast: Secondary | ICD-10-CM | POA: Insufficient documentation

## 2014-08-27 DIAGNOSIS — Z139 Encounter for screening, unspecified: Secondary | ICD-10-CM

## 2014-08-27 DIAGNOSIS — I10 Essential (primary) hypertension: Secondary | ICD-10-CM

## 2014-08-27 DIAGNOSIS — I779 Disorder of arteries and arterioles, unspecified: Secondary | ICD-10-CM

## 2014-08-27 DIAGNOSIS — M8580 Other specified disorders of bone density and structure, unspecified site: Secondary | ICD-10-CM

## 2014-08-27 DIAGNOSIS — E785 Hyperlipidemia, unspecified: Secondary | ICD-10-CM

## 2014-08-27 DIAGNOSIS — I739 Peripheral vascular disease, unspecified: Secondary | ICD-10-CM

## 2014-08-27 DIAGNOSIS — Z23 Encounter for immunization: Secondary | ICD-10-CM

## 2014-08-27 LAB — COMPREHENSIVE METABOLIC PANEL
ALBUMIN: 3.3 g/dL — AB (ref 3.5–5.2)
ALK PHOS: 72 U/L (ref 39–117)
ALT: 10 U/L (ref 0–35)
AST: 23 U/L (ref 0–37)
Anion gap: 10 (ref 5–15)
BUN: 17 mg/dL (ref 6–23)
CHLORIDE: 102 meq/L (ref 96–112)
CO2: 27 mEq/L (ref 19–32)
CREATININE: 1.27 mg/dL — AB (ref 0.50–1.10)
Calcium: 9.2 mg/dL (ref 8.4–10.5)
GFR calc Af Amer: 48 mL/min — ABNORMAL LOW (ref >90)
GFR calc non Af Amer: 41 mL/min — ABNORMAL LOW (ref >90)
Glucose, Bld: 110 mg/dL — ABNORMAL HIGH (ref 70–99)
POTASSIUM: 4.2 meq/L (ref 3.7–5.3)
Sodium: 139 mEq/L (ref 137–147)
Total Bilirubin: 0.6 mg/dL (ref 0.3–1.2)
Total Protein: 7.1 g/dL (ref 6.0–8.3)

## 2014-08-27 LAB — CBC WITH DIFFERENTIAL/PLATELET
BASOS ABS: 0 10*3/uL (ref 0.0–0.1)
BASOS PCT: 0 % (ref 0–1)
Eosinophils Absolute: 0.2 10*3/uL (ref 0.0–0.7)
Eosinophils Relative: 4 % (ref 0–5)
HCT: 37.2 % (ref 36.0–46.0)
Hemoglobin: 12.5 g/dL (ref 12.0–15.0)
Lymphocytes Relative: 20 % (ref 12–46)
Lymphs Abs: 1 10*3/uL (ref 0.7–4.0)
MCH: 32.2 pg (ref 26.0–34.0)
MCHC: 33.6 g/dL (ref 30.0–36.0)
MCV: 95.9 fL (ref 78.0–100.0)
Monocytes Absolute: 0.5 10*3/uL (ref 0.1–1.0)
Monocytes Relative: 10 % (ref 3–12)
NEUTROS PCT: 66 % (ref 43–77)
Neutro Abs: 3.4 10*3/uL (ref 1.7–7.7)
Platelets: 158 10*3/uL (ref 150–400)
RBC: 3.88 MIL/uL (ref 3.87–5.11)
RDW: 12.3 % (ref 11.5–15.5)
WBC: 5.2 10*3/uL (ref 4.0–10.5)

## 2014-08-27 LAB — LACTATE DEHYDROGENASE: LDH: 234 U/L (ref 94–250)

## 2014-08-27 MED ORDER — AMLODIPINE BESYLATE 5 MG PO TABS: 5.0000 mg | ORAL_TABLET | Freq: Every day | ORAL | Status: DC

## 2014-08-27 MED ORDER — DICLOFENAC SODIUM 75 MG PO TBEC: 75.0000 mg | DELAYED_RELEASE_TABLET | Freq: Two times a day (BID) | ORAL | Status: DC | PRN

## 2014-08-27 MED ORDER — ATENOLOL 50 MG PO TABS: 50.0000 mg | ORAL_TABLET | ORAL | Status: DC

## 2014-08-27 MED ORDER — CITALOPRAM HYDROBROMIDE 20 MG PO TABS: 40.0000 mg | ORAL_TABLET | Freq: Every day | ORAL | Status: DC

## 2014-08-27 MED ORDER — SIMVASTATIN 20 MG PO TABS: 20.0000 mg | ORAL_TABLET | Freq: Every day | ORAL | Status: DC

## 2014-08-27 NOTE — Progress Notes (Signed)
   Subjective:    Patient ID: Stephanie [name], female    DOB: [birth year], 73 y.o.   MRN: [id number]  Hypertension This is a chronic problem. The current episode started more than 1 year ago. Risk factors for coronary artery disease include dyslipidemia, post-menopausal state and sedentary lifestyle. Treatments tried: norvasc, atenolol. There are no compliance problems.    Patient seen in ER Sat with back pain. I reviewed overall of those ER notes also talk with patient at length. She is having low back pain does not wake her up at night no fevers with no sweats does not radiate down the leg.  She also has a history of hyperlipidemia she does try to watch her diet her son does a significant amount of help for her. Handicap certificate given to him for him to get a placard for the car  Review of Systems  Constitutional: Negative for activity change, appetite change and fatigue.  Endocrine: Negative for polydipsia and polyphagia.  Genitourinary: Negative for frequency.  Neurological: Negative for weakness.  Psychiatric/Behavioral: Negative for confusion.       Objective:   Physical Exam  Vitals reviewed. Constitutional: She appears well-nourished. No distress.  Cardiovascular: Normal rate, regular rhythm and normal heart sounds.   No murmur heard. Pulmonary/Chest: Effort normal and breath sounds normal. No respiratory distress.  Musculoskeletal: She exhibits no edema.  Lymphadenopathy:    She has no cervical adenopathy.  Neurological: She is alert. She exhibits normal muscle tone.  Psychiatric: Her behavior is normal.          Assessment & Plan:  HTN-stable continue current measures Lumbar back pain with arthritic changes-use Tylenol on a regular basis if this does not do enough may use Voltaren twice a day when necessary Hyperlipidemia no need to do lab work currently we will do this in the spring time Bone density recommended x-rays show some evidence of osteoporosis  Patient  has a history of carotid artery disease will set her up for a follow-up ultrasound of the carotid arteries.

## 2014-08-27 NOTE — Progress Notes (Signed)
[facility]  OFFICE PROGRESS [name], MD [address] [place] [address]  DIAGNOSIS: Cancer - Plan: CBC with Differential, Comprehensive metabolic panel, Lactate dehydrogenase  Chief Complaint  Patient presents with  . EGFR mutated adenocarcinoma of the lung  . Back Pain    CURRENT THERAPY: Tarceva 25 mg daily started on 06/25/2014.  INTERVAL HISTORY: Stephanie [name] 73 y.o. female returns foadenocarcinoma of the lung, status post thoracoscopic left upper lobectomy and mediastinal lymph node dissection on 11/18/2011 for well-differentiated disease, exon 19 deletion positive, having received no postoperative therapy. It was recommended she try Tarceva because of changes on her chest CT scan but after reading the package insert, she decided not to take the medication.k. She is status post CVA with speech difficulties. On 06/04/2014 she was started on Tarceva 25 mg daily because of worsening findings on most recent CT scan performed on 05/26/2014. She had developed some worsening back pain and had an x-ray done yesterday which was consistent with her known scoliosis and degenerative joint disease. She is taking Voltaren with relief. She denies any sore throat, diarrhea, constipation, nausea, vomiting, cough, chest pain, PND, orthopnea, palpitations, headache, or seizures. She does have a minimal erythematous rash involving the maxillary regions of the face. She has an appointment with a dermatologist later today and is to have a mammogram this morning.   MEDICAL HISTORY: Past Medical History  Diagnosis Date  . CVA (cerebral infarction)     speech difficulities  . Stroke   . Hypercholesteremia     Takes Zocor daily  . Hypertension     takes Atenolol and Lisinopril daily  . Lung mass     left upper lobe  . Arthritis     hands  . TIA (transient ischemic attack)     Sat before thanksgiving;impaired speech  . Pneumothorax   .  Lung cancer     INTERIM HISTORY: has CVA (cerebral infarction); HTN (hypertension); Pneumothorax of left lung after biopsy; Adenocarcinoma of left lung, stage 1; Dysarthria as late effect of stroke; Bilateral carotid artery disease; Other and unspecified hyperlipidemia; Lack of coordination; Muscle weakness (generalized); and Hyperlipidemia on her problem list.    ALLERGIES:  has No Known Allergies.  MEDICATIONS: has a current medication list which includes the following prescription(s): amlodipine, aspirin ec, atenolol, citalopram, diclofenac, diphenoxylate-atropine, erlotinib, prochlorperazine, and simvastatin.  SURGICAL HISTORY:  Past Surgical History  Procedure Laterality Date  . No past surgeries    . Lung biopsy  10/13/11    left  . Abdominal hysterectomy  1975  . Varicose vein surgery  48yrs ago  . Video assisted thoracoscopy  11/18/2011    Procedure: VIDEO ASSISTED THORACOSCOPY;  Surgeon: Melrose Nakayama, MD;  Location: [facility];  Service: Thoracic;  Laterality: Left;  . Lobectomy  11/18/2011    Procedure: LOBECTOMY;  Surgeon: Melrose Nakayama, MD;  Location: [facility];  Service: Thoracic;  Laterality: Left;  LEFT UPPER LOBECTOMY  . Breast biopsy  early 2000    FAMILY HISTORY: family history includes Cancer in her brother; Diabetes in her brother; Stroke in her brother, mother, and sister. There is no history of Anesthesia problems, Hypotension, Malignant hyperthermia, or Pseudochol deficiency.  SOCIAL HISTORY:  reports that she has never smoked. She has never used smokeless tobacco. She reports that she does not drink alcohol or use illicit drugs.  REVIEW OF SYSTEMS:  Other than that discussed above  is noncontributory.  PHYSICAL EXAMINATION: ECOG PERFORMANCE STATUS: 1 - Symptomatic but completely ambulatory  Blood pressure 143/67, pulse 55, temperature 98 F (36.7 C), temperature source Oral, resp. rate 18, weight 155 lb 1.6 oz (70.353 kg), SpO2 99.00%.  GENERAL:alert, no  distress and comfortable SKIN: skin color, texture, turgor are normal, no rashes or significant lesions. Minimal erythematous changes particularly on the left maxillary region. Pigmented lesion in the upper thorax, 1 cm in size, no ulceration or bleeding. EYES: PERLA; Conjunctiva are pink and non-injected, sclera clear SINUSES: No redness or tenderness over maxillary or ethmoid sinuses OROPHARYNX:no exudate, no erythema on lips, buccal mucosa, or tongue. NECK: supple, thyroid normal size, non-tender, without nodularity. No masses CHEST: Normal AP diameter with no breast masses. LYMPH:  no palpable lymphadenopathy in the cervical, axillary or inguinal LUNGS: clear to auscultation and percussion with normal breathing effort HEART: regular rate & rhythm and no murmurs. ABDOMEN:abdomen soft, non-tender and normal bowel sounds MUSCULOSKELETAL:no cyanosis of digits and no clubbing. Range of motion normal.  NEURO: alert & oriented x 3 with fluent speech, no focal motor/sensory deficits. Right hemiparesis grade 3 over 5.   LABORATORY DATA: Lab on 08/27/2014  Component Date Value Ref Range Status  . WBC 08/27/2014 5.2  4.0 - 10.5 K/uL Final  . RBC 08/27/2014 3.88  3.87 - 5.11 MIL/uL Final  . Hemoglobin 08/27/2014 12.5  12.0 - 15.0 g/dL Final  . HCT 08/27/2014 37.2  36.0 - 46.0 % Final  . MCV 08/27/2014 95.9  78.0 - 100.0 fL Final  . MCH 08/27/2014 32.2  26.0 - 34.0 pg Final  . MCHC 08/27/2014 33.6  30.0 - 36.0 g/dL Final  . RDW 08/27/2014 12.3  11.5 - 15.5 % Final  . Platelets 08/27/2014 158  150 - 400 K/uL Final  . Neutrophils Relative % 08/27/2014 66  43 - 77 % Final  . Neutro Abs 08/27/2014 3.4  1.7 - 7.7 K/uL Final  . Lymphocytes Relative 08/27/2014 20  12 - 46 % Final  . Lymphs Abs 08/27/2014 1.0  0.7 - 4.0 K/uL Final  . Monocytes Relative 08/27/2014 10  3 - 12 % Final  . Monocytes Absolute 08/27/2014 0.5  0.1 - 1.0 K/uL Final  . Eosinophils Relative 08/27/2014 4  0 - 5 % Final  .  Eosinophils Absolute 08/27/2014 0.2  0.0 - 0.7 K/uL Final  . Basophils Relative 08/27/2014 0  0 - 1 % Final  . Basophils Absolute 08/27/2014 0.0  0.0 - 0.1 K/uL Final  . Sodium 08/27/2014 139  137 - 147 mEq/L Final  . Potassium 08/27/2014 4.2  3.7 - 5.3 mEq/L Final  . Chloride 08/27/2014 102  96 - 112 mEq/L Final  . CO2 08/27/2014 27  19 - 32 mEq/L Final  . Glucose, Bld 08/27/2014 110* 70 - 99 mg/dL Final  . BUN 08/27/2014 17  6 - 23 mg/dL Final  . Creatinine, Ser 08/27/2014 1.27* 0.50 - 1.10 mg/dL Final  . Calcium 08/27/2014 9.2  8.4 - 10.5 mg/dL Final  . Total Protein 08/27/2014 7.1  6.0 - 8.3 g/dL Final  . Albumin 08/27/2014 3.3* 3.5 - 5.2 g/dL Final  . AST 08/27/2014 23  0 - 37 U/L Final  . ALT 08/27/2014 10  0 - 35 U/L Final  . Alkaline Phosphatase 08/27/2014 72  39 - 117 U/L Final  . Total Bilirubin 08/27/2014 0.6  0.3 - 1.2 mg/dL Final  . GFR calc non Af Amer 08/27/2014 41* >90 mL/min Final  .  GFR calc Af Amer 08/27/2014 48* >90 mL/min Final   Comment: (NOTE)                          The eGFR has been calculated using the CKD EPI equation.                          This calculation has not been validated in all clinical situations.                          eGFR's persistently <90 mL/min signify possible Chronic Kidney                          Disease.  . Anion gap 08/27/2014 10  5 - 15 Final  . LDH 08/27/2014 234  94 - 250 U/L Final    PATHOLOGY: EGFR mutated adenocarcinoma  Urinalysis    Component Value Date/Time   COLORURINE YELLOW 11/16/2011 1600   APPEARANCEUR CLEAR 11/16/2011 1600   LABSPEC 1.021 11/16/2011 1600   PHURINE 5.0 11/16/2011 1600   GLUCOSEU NEGATIVE 11/16/2011 1600   HGBUR MODERATE* 11/16/2011 1600   BILIRUBINUR NEGATIVE 11/16/2011 1600   KETONESUR NEGATIVE 11/16/2011 1600   PROTEINUR 30* 11/16/2011 1600   UROBILINOGEN 1.0 11/16/2011 1600   NITRITE NEGATIVE 11/16/2011 1600   LEUKOCYTESUR NEGATIVE 11/16/2011 1600    RADIOGRAPHIC STUDIES: Dg Lumbar Spine  Complete  08/23/2014   CLINICAL DATA:  Worsening low back pain for a couple days, no known injury, history lung cancer post surgery, stroke, hypertension, hypercholesterolemia  EXAM: LUMBAR SPINE - COMPLETE 4+ VIEW  COMPARISON:  CT abdomen and pelvis 02/12/2014  FINDINGS: Five non-rib-bearing lumbar vertebrae.  Diffuse osseous demineralization.  Mild biconvex scoliosis.  Multilevel disc space narrowing and endplate spur formation greatest at L1-L2.  Superior endplate height loss at L1 unchanged from prior CT.  No acute fracture, subluxation or bone destruction.  Facet degenerative changes lower lumbar spine.  Scattered atherosclerotic calcifications.  SI joints preserved.  IMPRESSION: Osseous demineralization with biconvex thoracolumbar scoliosis and multilevel degenerative disc and facet disease changes.  Chronic superior endplate compression deformity of L1 vertebral body.  No acute lumbar spine abnormalities.   Electronically Signed   By: Lavonia Dana M.D.   On: 08/23/2014 14:00   l ASSESSMENT:  #1. 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 #2. Asymptomatic cholelithiasis.  #3. Status post left cerebral CVA with right hemiparesis, currently undergoing occupational therapy and physical therapy.  #4. Hypertension, controlled.     PLAN:  #1. Continue Tarceva 25 mg daily. #2. Follow-up dermatologist later today as well as family physician. #3. Mammogram later this morning. #4. Follow-up in one month with CBC, chem profile, LDH     All questions were answered. The patient knows to call the clinic with any problems, questions or concerns. We can certainly see the patient much sooner if necessary.   I spent 25 minutes counseling the patient face to face. The total time spent in the appointment  was 30 minutes.    Doroteo Bradford, MD 08/27/2014 9:37 AM  DISCLAIMER:  This note was dictated with voice recognition software.  Similar sounding words can inadvertently be transcribed inaccurately  and may not be corrected upon review.

## 2014-08-27 NOTE — Progress Notes (Signed)
Labs for cbcd,cmp,ldh

## 2014-08-27 NOTE — Patient Instructions (Signed)
[facility] Discharge Instructions  RECOMMENDATIONS MADE BY THE CONSULTANT AND ANY TEST RESULTS WILL BE SENT TO YOUR REFERRING PHYSICIAN.  We will see you in one month for repeat lab work and follow up visit with the doctor. Please call for any questions or concerns.  On your next visit we will do a CBC, Chemistries and LDH.  Thank you for choosing [facility] to provide your oncology and hematology care.  To afford each patient quality time with our providers, please arrive at least 15 minutes before your scheduled appointment time.  With your help, our goal is to use those 15 minutes to complete the necessary work-up to ensure our physicians have the information they need to help with your evaluation and healthcare recommendations.    Effective January 1st, 2014, we ask that you re-schedule your appointment with our physicians should you arrive 10 or more minutes late for your appointment.  We strive to give you quality time with our providers, and arriving late affects you and other patients whose appointments are after yours.    Again, thank you for choosing [facility].  Our hope is that these requests will decrease the amount of time that you wait before being seen by our physicians.       _____________________________________________________________  Should you have questions after your visit to [facility], please contact our office at [phone] between the hours of 8:30 a.m. and 4:30 p.m.  Voicemails left after 4:30 p.m. will not be returned until the following business day.  For prescription refill requests, have your pharmacy contact our office with your prescription refill request.    _______________________________________________________________  We hope that we have given you very good care.  You may receive a patient satisfaction survey in the mail, please complete it and return it as soon as possible.  We value  your feedback!  _______________________________________________________________  Have you asked about our STAR program?  STAR stands for Survivorship Training and Rehabilitation, and this is a nationally recognized cancer care program that focuses on survivorship and rehabilitation.  Cancer and cancer treatments may cause problems, such as, pain, making you feel tired and keeping you from doing the things that you need or want to do. Cancer rehabilitation can help. Our goal is to reduce these troubling effects and help you have the best quality of life possible.  You may receive a survey from a nurse that asks questions about your current state of health.  Based on the survey results, all eligible patients will be referred to the [facility] program for an evaluation so we can better serve you!  A frequently asked questions sheet is available upon request.

## 2014-09-01 ENCOUNTER — Other Ambulatory Visit (HOSPITAL_COMMUNITY): Payer: Medicare Other

## 2014-09-29 ENCOUNTER — Encounter (HOSPITAL_COMMUNITY): Payer: Self-pay

## 2014-09-29 ENCOUNTER — Encounter (HOSPITAL_BASED_OUTPATIENT_CLINIC_OR_DEPARTMENT_OTHER): Payer: Medicare Other

## 2014-09-29 ENCOUNTER — Encounter (HOSPITAL_COMMUNITY): Payer: Medicare Other | Attending: Hematology and Oncology

## 2014-09-29 VITALS — BP 102/53 | HR 56 | Temp 98.3°F | Resp 14 | Wt 153.0 lb

## 2014-09-29 DIAGNOSIS — C3492 Malignant neoplasm of unspecified part of left bronchus or lung: Secondary | ICD-10-CM | POA: Diagnosis present

## 2014-09-29 DIAGNOSIS — C801 Malignant (primary) neoplasm, unspecified: Secondary | ICD-10-CM | POA: Diagnosis present

## 2014-09-29 DIAGNOSIS — K8020 Calculus of gallbladder without cholecystitis without obstruction: Secondary | ICD-10-CM

## 2014-09-29 DIAGNOSIS — C3412 Malignant neoplasm of upper lobe, left bronchus or lung: Secondary | ICD-10-CM

## 2014-09-29 DIAGNOSIS — I63322 Cerebral infarction due to thrombosis of left anterior cerebral artery: Secondary | ICD-10-CM

## 2014-09-29 LAB — CBC WITH DIFFERENTIAL/PLATELET
Basophils Absolute: 0 10*3/uL (ref 0.0–0.1)
Basophils Relative: 1 % (ref 0–1)
Eosinophils Absolute: 0.2 10*3/uL (ref 0.0–0.7)
Eosinophils Relative: 4 % (ref 0–5)
HCT: 37.9 % (ref 36.0–46.0)
Hemoglobin: 12.9 g/dL (ref 12.0–15.0)
LYMPHS ABS: 0.9 10*3/uL (ref 0.7–4.0)
Lymphocytes Relative: 17 % (ref 12–46)
MCH: 32.7 pg (ref 26.0–34.0)
MCHC: 34 g/dL (ref 30.0–36.0)
MCV: 95.9 fL (ref 78.0–100.0)
Monocytes Absolute: 0.5 10*3/uL (ref 0.1–1.0)
Monocytes Relative: 10 % (ref 3–12)
Neutro Abs: 3.5 10*3/uL (ref 1.7–7.7)
Neutrophils Relative %: 68 % (ref 43–77)
Platelets: 172 10*3/uL (ref 150–400)
RBC: 3.95 MIL/uL (ref 3.87–5.11)
RDW: 12.4 % (ref 11.5–15.5)
WBC: 5.1 10*3/uL (ref 4.0–10.5)

## 2014-09-29 LAB — COMPREHENSIVE METABOLIC PANEL
ALT: 10 U/L (ref 0–35)
ANION GAP: 10 (ref 5–15)
AST: 23 U/L (ref 0–37)
Albumin: 3.4 g/dL — ABNORMAL LOW (ref 3.5–5.2)
Alkaline Phosphatase: 78 U/L (ref 39–117)
BUN: 15 mg/dL (ref 6–23)
CALCIUM: 9.4 mg/dL (ref 8.4–10.5)
CO2: 28 mEq/L (ref 19–32)
Chloride: 100 mEq/L (ref 96–112)
Creatinine, Ser: 1.3 mg/dL — ABNORMAL HIGH (ref 0.50–1.10)
GFR calc non Af Amer: 40 mL/min — ABNORMAL LOW (ref >90)
GFR, EST AFRICAN AMERICAN: 46 mL/min — AB (ref >90)
GLUCOSE: 103 mg/dL — AB (ref 70–99)
Potassium: 4 mEq/L (ref 3.7–5.3)
SODIUM: 138 meq/L (ref 137–147)
Total Bilirubin: 0.7 mg/dL (ref 0.3–1.2)
Total Protein: 7.2 g/dL (ref 6.0–8.3)

## 2014-09-29 LAB — LACTATE DEHYDROGENASE: LDH: 263 U/L — ABNORMAL HIGH (ref 94–250)

## 2014-09-29 NOTE — Progress Notes (Signed)
      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 Thoracoscopic left upper lobe resection 11/08/2011.  INTERVAL HISTORY: Stephanie [name] 73 y.o. female returns for follow-up of adenocarcinoma of the lung, status post thoracoscopic left upper lobectomy and mediastinal lymph node dissection on 11/18/2011 for well-differentiated disease, exon 19 deletion positive, having received no postoperative therapy. It was recommended she try Tarceva because of changes on her chest CT scan but after reading the package insert, she decided not to take the medication.k. She is status post CVA with speech difficulties. On 06/04/2014 she was started on Tarceva 25 mg daily because of worsening findings on most recent CT scan performed on 05/26/2014. She does experience minimal coughing while lying down. She denies any dysphagia, fever, night sweats, abdominal pain but it have a fall injuring this small toe in her right foot. No fracture was noted. She denies any abdominal pain, nausea, vomiting, diarrhea, worsening skin rash. She was seen by a dermatologist who removed at actinic keratoses over the past month.  MEDICAL HISTORY: Past Medical History  Diagnosis Date  . CVA (cerebral infarction)     speech difficulities  . Stroke   . Hypercholesteremia     Takes Zocor daily  . Hypertension     takes Atenolol and Lisinopril daily  . Lung mass     left upper lobe  . Arthritis     hands  . TIA (transient ischemic attack)     Sat  before thanksgiving;impaired speech  . Pneumothorax   . Lung cancer     INTERIM HISTORY: has CVA (cerebral infarction); HTN (hypertension); Pneumothorax of left lung after biopsy; Adenocarcinoma of left lung, stage 1; Dysarthria as late effect of stroke; Bilateral carotid artery disease; Other and unspecified hyperlipidemia; Lack of coordination; Muscle weakness (generalized); and Hyperlipidemia on her problem list.     Adenocarcinoma of left lung, stage 1   10/19/2011 Initial Diagnosis Adenocarcinoma with Exon 19 deletion, ALK-   11/08/2011 Surgery Thoracoscopic left upper lobectomy with mediastinal exploration, T2aN0 adenocarcinoma with bronchoalveolar  characteristics,  4.3cm, exon 19 deletion, ALK negative, Dr. Hendrickson   05/26/2014 Imaging CT scan showed worsening.   06/25/2014 -  Chemotherapy Tarceva 25 mg daily started     ALLERGIES:  has No Known Allergies.  MEDICATIONS: has a current medication list which includes the following prescription(s): amlodipine, aspirin ec, atenolol, citalopram, diphenoxylate-atropine, erlotinib, simvastatin, diclofenac, and prochlorperazine.  SURGICAL HISTORY:  Past Surgical History  Procedure Laterality Date  . No past surgeries    . Lung biopsy  10/13/11    left  . Abdominal hysterectomy  1975  . Varicose vein surgery  18yrs ago  . Video assisted thoracoscopy  11/18/2011    Procedure: VIDEO ASSISTED THORACOSCOPY;  Surgeon: Steven C Hendrickson, MD;  Location: [facility] OR;  Service: Thoracic;  Laterality: Left;  . Lobectomy  11/18/2011    Procedure: LOBECTOMY;  Surgeon: Steven C Hendrickson, MD;  Location: [facility] OR;  Service: Thoracic;  Laterality: Left;  LEFT UPPER LOBECTOMY  . Breast biopsy    early 2000    FAMILY HISTORY: family history includes Cancer in her brother; Diabetes in her brother; Stroke in her brother, mother, and sister. There is no history of Anesthesia problems, Hypotension, Malignant hyperthermia, or Pseudochol deficiency.  SOCIAL  HISTORY:  reports that she has never smoked. She has never used smokeless tobacco. She reports that she does not drink alcohol or use illicit drugs.  REVIEW OF SYSTEMS:  Other than that discussed above is noncontributory.  PHYSICAL EXAMINATION: ECOG PERFORMANCE STATUS: 1 - Symptomatic but completely ambulatory  Blood pressure 102/53, pulse 56, temperature 98.3 F (36.8 C), temperature source Oral, resp. rate 14, weight 153 lb (69.4 kg), SpO2 100 %.  GENERAL:alert, no distress and comfortable SKIN: skin color, texture, turgor are normal, no rashes or significant lesions. Superficial acneform lesions on the face EYES: PERLA; Conjunctiva are pink and non-injected, sclera clear SINUSES: No redness or tenderness over maxillary or ethmoid sinuses OROPHARYNX:no exudate, no erythema on lips, buccal mucosa, or tongue. NECK: supple, thyroid normal size, non-tender, without nodularity. No masses CHEST: Slightly increased AP diameter with no breast masses.. Anterior chest pigmented 4 mm mass unchanged. LYMPH:  no palpable lymphadenopathy in the cervical, axillary or inguinal LUNGS: clear to auscultation and percussion with normal breathing effort HEART: regular rate & rhythm and no murmurs. ABDOMEN:abdomen soft, non-tender and normal bowel sounds MUSCULOSKELETAL:no cyanosis of digits and no clubbing. Range of motion normal.  NEURO: alert & oriented x 3 with fluent speech, no focal motor/sensory deficits. Expressive dysphasia. Right hemi-passes grade 3 over 5.   LABORATORY DATA: Office Visit on 09/29/2014  Component Date Value Ref Range Status  . WBC 09/29/2014 5.1  4.0 - 10.5 K/uL Final  . RBC 09/29/2014 3.95  3.87 - 5.11 MIL/uL Final  . Hemoglobin 09/29/2014 12.9  12.0 - 15.0 g/dL Final  . HCT 09/29/2014 37.9  36.0 - 46.0 % Final  . MCV 09/29/2014 95.9  78.0 - 100.0 fL Final  . MCH 09/29/2014 32.7  26.0 - 34.0 pg Final  . MCHC 09/29/2014 34.0  30.0 - 36.0 g/dL Final  . RDW 09/29/2014 12.4   11.5 - 15.5 % Final  . Platelets 09/29/2014 172  150 - 400 K/uL Final  . Neutrophils Relative % 09/29/2014 68  43 - 77 % Final  . Neutro Abs 09/29/2014 3.5  1.7 - 7.7 K/uL Final  . Lymphocytes Relative 09/29/2014 17  12 - 46 % Final  . Lymphs Abs 09/29/2014 0.9  0.7 - 4.0 K/uL Final  . Monocytes Relative 09/29/2014 10  3 - 12 % Final  . Monocytes Absolute 09/29/2014 0.5  0.1 - 1.0 K/uL Final  . Eosinophils Relative 09/29/2014 4  0 - 5 % Final  . Eosinophils Absolute 09/29/2014 0.2  0.0 - 0.7 K/uL Final  . Basophils Relative 09/29/2014 1  0 - 1 % Final  . Basophils Absolute 09/29/2014 0.0  0.0 - 0.1 K/uL Final  . Sodium 09/29/2014 138  137 - 147 mEq/L Final  . Potassium 09/29/2014 4.0  3.7 - 5.3 mEq/L Final  . Chloride 09/29/2014 100  96 - 112 mEq/L Final  . CO2 09/29/2014 28  19 - 32 mEq/L Final  . Glucose, Bld 09/29/2014 103* 70 - 99 mg/dL Final  . BUN 09/29/2014 15  6 - 23 mg/dL Final  . Creatinine, Ser 09/29/2014 1.30* 0.50 - 1.10 mg/dL Final  . Calcium 09/29/2014 9.4  8.4 - 10.5 mg/dL Final  . Total Protein 09/29/2014 7.2  6.0 - 8.3 g/dL Final  .  Albumin 09/29/2014 3.4* 3.5 - 5.2 g/dL Final  . AST 09/29/2014 23  0 - 37 U/L Final  . ALT 09/29/2014 10  0 - 35 U/L Final  . Alkaline Phosphatase 09/29/2014 78  39 - 117 U/L Final  . Total Bilirubin 09/29/2014 0.7  0.3 - 1.2 mg/dL Final  . GFR calc non Af Amer 09/29/2014 40* >90 mL/min Final  . GFR calc Af Amer 09/29/2014 46* >90 mL/min Final   Comment: (NOTE) The eGFR has been calculated using the CKD EPI equation. This calculation has not been validated in all clinical situations. eGFR's persistently <90 mL/min signify possible Chronic Kidney Disease.   . Anion gap 09/29/2014 10  5 - 15 Final    PATHOLOGY: Deletion 19 adenocarcinoma  Urinalysis    Component Value Date/Time   COLORURINE YELLOW 11/16/2011 1600   APPEARANCEUR CLEAR 11/16/2011 1600   LABSPEC 1.021 11/16/2011 1600   PHURINE 5.0 11/16/2011 1600   GLUCOSEU  NEGATIVE 11/16/2011 1600   HGBUR MODERATE* 11/16/2011 1600   BILIRUBINUR NEGATIVE 11/16/2011 1600   KETONESUR NEGATIVE 11/16/2011 1600   PROTEINUR 30* 11/16/2011 1600   UROBILINOGEN 1.0 11/16/2011 1600   NITRITE NEGATIVE 11/16/2011 1600   LEUKOCYTESUR NEGATIVE 11/16/2011 1600    RADIOGRAPHIC STUDIES: No results found. MM DIGITAL SCREENING BILATERAL   Status: Final result       Study Result     CLINICAL DATA: Screening.  EXAM: DIGITAL SCREENING BILATERAL MAMMOGRAM WITH CAD  COMPARISON: Previous exam(s).  ACR Breast Density Category b: There are scattered areas of fibroglandular density.  FINDINGS: There are no findings suspicious for malignancy. Images were processed with CAD.  IMPRESSION: No mammographic evidence of malignancy. A result letter of this screening mammogram will be mailed directly to the patient.  RECOMMENDATION: Screening mammogram in one year. (Code:SM-B-01Y)  BI-RADS CATEGORY 1: Negative.   Electronically Signed  By: Drew Davis M.D.  On: 08/28/2014 13:    ASSESSMENT:  #1. 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 Patient is not excision undergoing another CAT scan at this time. #2. Asymptomatic cholelithiasis.  #3. Status post left cerebral CVA with right hemiparesis, currently undergoing occupational therapy and physical therapy.  #4. Hypertension, controlled.     PLAN:  #1. Continue Tarceva 25 mg daily. #2. Follow-up in one month with CBC, chem profile, LDH.   All questions were answered. The patient knows to call the clinic with any problems, questions or concerns. We can certainly see the patient much sooner if necessary.   I spent 25 minutes counseling the patient face to face. The total time spent in the  appointment was 30 minutes.    ,  A, MD 09/29/2014 11:40 AM  DISCLAIMER:  This note was dictated with voice recognition software.  Similar sounding words can inadvertently be transcribed inaccurately and may not be corrected upon review.   

## 2014-09-29 NOTE — Progress Notes (Signed)
Stephanie [name] presented for labwork. Labs per MD order drawn via Peripheral Line 23 gauge needle inserted in left AC  Good blood return present. Procedure without incident.  Needle removed intact. Patient tolerated procedure well.

## 2014-09-29 NOTE — Patient Instructions (Signed)
[facility] Discharge Instructions  RECOMMENDATIONS MADE BY THE CONSULTANT AND ANY TEST RESULTS WILL BE SENT TO YOUR REFERRING PHYSICIAN.  EXAM FINDINGS BY THE PHYSICIAN TODAY AND SIGNS OR SYMPTOMS TO REPORT TO CLINIC OR PRIMARY PHYSICIAN: Exam and findings as discussed by Dr. Barnet Glasgow.  Will check your labs and if any issues we will contact you.  Report fevers,chills, increased shortness of breath, etc.  MEDICATIONS PRESCRIBED:  none  INSTRUCTIONS/FOLLOW-UP: Follow-up in 1 month with labs and office visit.  Thank you for choosing [facility] to provide your oncology and hematology care.  To afford each patient quality time with our providers, please arrive at least 15 minutes before your scheduled appointment time.  With your help, our goal is to use those 15 minutes to complete the necessary work-up to ensure our physicians have the information they need to help with your evaluation and healthcare recommendations.    Effective January 1st, 2014, we ask that you re-schedule your appointment with our physicians should you arrive 10 or more minutes late for your appointment.  We strive to give you quality time with our providers, and arriving late affects you and other patients whose appointments are after yours.    Again, thank you for choosing [facility].  Our hope is that these requests will decrease the amount of time that you wait before being seen by our physicians.       _____________________________________________________________  Should you have questions after your visit to [facility], please contact our office at [phone] between the hours of 8:30 a.m. and 4:30 p.m.  Voicemails left after 4:30 p.m. will not be returned until the following business day.  For prescription refill requests, have your pharmacy contact our office with your prescription refill request.     _______________________________________________________________  We hope that we have given you very good care.  You may receive a patient satisfaction survey in the mail, please complete it and return it as soon as possible.  We value your feedback!  _______________________________________________________________  Have you asked about our STAR program?  STAR stands for Survivorship Training and Rehabilitation, and this is a nationally recognized cancer care program that focuses on survivorship and rehabilitation.  Cancer and cancer treatments may cause problems, such as, pain, making you feel tired and keeping you from doing the things that you need or want to do. Cancer rehabilitation can help. Our goal is to reduce these troubling effects and help you have the best quality of life possible.  You may receive a survey from a nurse that asks questions about your current state of health.  Based on the survey results, all eligible patients will be referred to the [facility] program for an evaluation so we can better serve you!  A frequently asked questions sheet is available upon request.

## 2014-10-15 ENCOUNTER — Inpatient Hospital Stay (HOSPITAL_COMMUNITY)
Admission: EM | Admit: 2014-10-15 | Discharge: 2014-10-17 | DRG: 292 | Disposition: A | Discharge status: Skilled Nursing Facility | Payer: Medicare Other | Attending: Family Medicine | Admitting: Family Medicine

## 2014-10-15 ENCOUNTER — Encounter (HOSPITAL_COMMUNITY): Payer: Self-pay | Admitting: Emergency Medicine

## 2014-10-15 ENCOUNTER — Emergency Department (HOSPITAL_COMMUNITY): Payer: Medicare Other

## 2014-10-15 DIAGNOSIS — N189 Chronic kidney disease, unspecified: Secondary | ICD-10-CM

## 2014-10-15 DIAGNOSIS — Z90710 Acquired absence of both cervix and uterus: Secondary | ICD-10-CM

## 2014-10-15 DIAGNOSIS — E785 Hyperlipidemia, unspecified: Secondary | ICD-10-CM | POA: Diagnosis present

## 2014-10-15 DIAGNOSIS — Z8673 Personal history of transient ischemic attack (TIA), and cerebral infarction without residual deficits: Secondary | ICD-10-CM | POA: Diagnosis not present

## 2014-10-15 DIAGNOSIS — G8191 Hemiplegia, unspecified affecting right dominant side: Secondary | ICD-10-CM | POA: Diagnosis present

## 2014-10-15 DIAGNOSIS — I5031 Acute diastolic (congestive) heart failure: Principal | ICD-10-CM | POA: Diagnosis present

## 2014-10-15 DIAGNOSIS — I10 Essential (primary) hypertension: Secondary | ICD-10-CM | POA: Diagnosis present

## 2014-10-15 DIAGNOSIS — M1990 Unspecified osteoarthritis, unspecified site: Secondary | ICD-10-CM | POA: Diagnosis present

## 2014-10-15 DIAGNOSIS — N179 Acute kidney failure, unspecified: Secondary | ICD-10-CM | POA: Diagnosis present

## 2014-10-15 DIAGNOSIS — I350 Nonrheumatic aortic (valve) stenosis: Secondary | ICD-10-CM | POA: Diagnosis present

## 2014-10-15 DIAGNOSIS — C3492 Malignant neoplasm of unspecified part of left bronchus or lung: Secondary | ICD-10-CM | POA: Diagnosis present

## 2014-10-15 DIAGNOSIS — R0602 Shortness of breath: Secondary | ICD-10-CM | POA: Diagnosis not present

## 2014-10-15 DIAGNOSIS — F329 Major depressive disorder, single episode, unspecified: Secondary | ICD-10-CM | POA: Diagnosis present

## 2014-10-15 DIAGNOSIS — I129 Hypertensive chronic kidney disease with stage 1 through stage 4 chronic kidney disease, or unspecified chronic kidney disease: Secondary | ICD-10-CM | POA: Diagnosis present

## 2014-10-15 DIAGNOSIS — Z823 Family history of stroke: Secondary | ICD-10-CM | POA: Diagnosis not present

## 2014-10-15 DIAGNOSIS — I739 Peripheral vascular disease, unspecified: Secondary | ICD-10-CM

## 2014-10-15 DIAGNOSIS — Z66 Do not resuscitate: Secondary | ICD-10-CM | POA: Diagnosis present

## 2014-10-15 DIAGNOSIS — Z79899 Other long term (current) drug therapy: Secondary | ICD-10-CM

## 2014-10-15 DIAGNOSIS — N183 Chronic kidney disease, stage 3 (moderate): Secondary | ICD-10-CM | POA: Diagnosis present

## 2014-10-15 DIAGNOSIS — M8588 Other specified disorders of bone density and structure, other site: Secondary | ICD-10-CM | POA: Diagnosis present

## 2014-10-15 DIAGNOSIS — J40 Bronchitis, not specified as acute or chronic: Secondary | ICD-10-CM | POA: Diagnosis present

## 2014-10-15 DIAGNOSIS — R0600 Dyspnea, unspecified: Secondary | ICD-10-CM | POA: Diagnosis present

## 2014-10-15 DIAGNOSIS — Z5111 Encounter for antineoplastic chemotherapy: Secondary | ICD-10-CM | POA: Diagnosis not present

## 2014-10-15 DIAGNOSIS — R911 Solitary pulmonary nodule: Secondary | ICD-10-CM | POA: Diagnosis present

## 2014-10-15 DIAGNOSIS — I779 Disorder of arteries and arterioles, unspecified: Secondary | ICD-10-CM | POA: Diagnosis present

## 2014-10-15 DIAGNOSIS — I422 Other hypertrophic cardiomyopathy: Secondary | ICD-10-CM | POA: Diagnosis present

## 2014-10-15 DIAGNOSIS — R451 Restlessness and agitation: Secondary | ICD-10-CM | POA: Diagnosis present

## 2014-10-15 DIAGNOSIS — E780 Pure hypercholesterolemia: Secondary | ICD-10-CM | POA: Diagnosis present

## 2014-10-15 LAB — CBC WITH DIFFERENTIAL/PLATELET
BASOS ABS: 0 10*3/uL (ref 0.0–0.1)
Basophils Relative: 0 % (ref 0–1)
Eosinophils Absolute: 0.3 10*3/uL (ref 0.0–0.7)
Eosinophils Relative: 5 % (ref 0–5)
HCT: 37.8 % (ref 36.0–46.0)
Hemoglobin: 12.7 g/dL (ref 12.0–15.0)
Lymphocytes Relative: 20 % (ref 12–46)
Lymphs Abs: 1 10*3/uL (ref 0.7–4.0)
MCH: 32.5 pg (ref 26.0–34.0)
MCHC: 33.6 g/dL (ref 30.0–36.0)
MCV: 96.7 fL (ref 78.0–100.0)
Monocytes Absolute: 0.6 10*3/uL (ref 0.1–1.0)
Monocytes Relative: 12 % (ref 3–12)
NEUTROS ABS: 3.2 10*3/uL (ref 1.7–7.7)
Neutrophils Relative %: 63 % (ref 43–77)
Platelets: 167 10*3/uL (ref 150–400)
RBC: 3.91 MIL/uL (ref 3.87–5.11)
RDW: 12.6 % (ref 11.5–15.5)
WBC: 5.1 10*3/uL (ref 4.0–10.5)

## 2014-10-15 LAB — BASIC METABOLIC PANEL
Anion gap: 12 (ref 5–15)
BUN: 12 mg/dL (ref 6–23)
CALCIUM: 9.2 mg/dL (ref 8.4–10.5)
CO2: 26 mEq/L (ref 19–32)
CREATININE: 1.39 mg/dL — AB (ref 0.50–1.10)
Chloride: 104 mEq/L (ref 96–112)
GFR calc Af Amer: 43 mL/min — ABNORMAL LOW (ref >90)
GFR, EST NON AFRICAN AMERICAN: 37 mL/min — AB (ref >90)
GLUCOSE: 97 mg/dL (ref 70–99)
Potassium: 3.6 mEq/L — ABNORMAL LOW (ref 3.7–5.3)
SODIUM: 142 meq/L (ref 137–147)

## 2014-10-15 LAB — URINALYSIS, ROUTINE W REFLEX MICROSCOPIC
BILIRUBIN URINE: NEGATIVE
Glucose, UA: NEGATIVE mg/dL
KETONES UR: NEGATIVE mg/dL
Nitrite: NEGATIVE
Protein, ur: NEGATIVE mg/dL
Specific Gravity, Urine: >1.03 — ABNORMAL HIGH (ref 1.005–1.030)
Urobilinogen, UA: 1 mg/dL (ref 0.0–1.0)
pH: 5.5 (ref 5.0–8.0)

## 2014-10-15 LAB — URINE MICROSCOPIC-ADD ON

## 2014-10-15 LAB — PRO B NATRIURETIC PEPTIDE: PRO B NATRI PEPTIDE: 594.5 pg/mL — AB (ref 0–125)

## 2014-10-15 LAB — TROPONIN I: Troponin I: <0.3 ng/mL (ref <0.30)

## 2014-10-15 LAB — D-DIMER, QUANTITATIVE (NOT AT ARMC): D-Dimer, Quant: 0.55 ug/mL-FEU — ABNORMAL HIGH (ref 0.00–0.48)

## 2014-10-15 MED ORDER — FUROSEMIDE 10 MG/ML IJ SOLN
20.0000 mg | Freq: Two times a day (BID) | INTRAMUSCULAR | Status: DC
  Administered 2014-10-16 (×2): 20 mg via INTRAVENOUS
  Filled 2014-10-15 (×2): qty 2

## 2014-10-15 MED ORDER — AMLODIPINE BESYLATE 5 MG PO TABS
5.0000 mg | ORAL_TABLET | Freq: Every day | ORAL | Status: DC
  Administered 2014-10-17: 5 mg via ORAL
  Filled 2014-10-15 (×2): qty 1

## 2014-10-15 MED ORDER — ATENOLOL 25 MG PO TABS
50.0000 mg | ORAL_TABLET | ORAL | Status: DC
  Filled 2014-10-15: qty 2

## 2014-10-15 MED ORDER — ASPIRIN EC 81 MG PO TBEC
81.0000 mg | DELAYED_RELEASE_TABLET | Freq: Every morning | ORAL | Status: DC
  Administered 2014-10-16 – 2014-10-17 (×2): 81 mg via ORAL
  Filled 2014-10-15 (×2): qty 1

## 2014-10-15 MED ORDER — ONDANSETRON HCL 4 MG/2ML IJ SOLN: 4.0000 mg | Freq: Four times a day (QID) | INTRAMUSCULAR | Status: DC | PRN

## 2014-10-15 MED ORDER — SODIUM CHLORIDE 0.9 % IJ SOLN
3.0000 mL | Freq: Two times a day (BID) | INTRAMUSCULAR | Status: DC
  Administered 2014-10-16 – 2014-10-17 (×4): 3 mL via INTRAVENOUS

## 2014-10-15 MED ORDER — SODIUM CHLORIDE 0.9 % IV SOLN: 250.0000 mL | INTRAVENOUS | Status: DC | PRN

## 2014-10-15 MED ORDER — LORAZEPAM 2 MG/ML IJ SOLN
0.5000 mg | Freq: Once | INTRAMUSCULAR | Status: AC
  Administered 2014-10-15: 0.5 mg via INTRAVENOUS

## 2014-10-15 MED ORDER — ACETAMINOPHEN 650 MG RE SUPP: 650.0000 mg | Freq: Four times a day (QID) | RECTAL | Status: DC | PRN

## 2014-10-15 MED ORDER — GUAIFENESIN ER 600 MG PO TB12
1200.0000 mg | ORAL_TABLET | Freq: Two times a day (BID) | ORAL | Status: DC
  Administered 2014-10-16 – 2014-10-17 (×3): 1200 mg via ORAL
  Filled 2014-10-15 (×4): qty 2

## 2014-10-15 MED ORDER — METHYLPREDNISOLONE SODIUM SUCC 125 MG IJ SOLR
125.0000 mg | Freq: Once | INTRAMUSCULAR | Status: AC
  Administered 2014-10-15: 125 mg via INTRAVENOUS
  Filled 2014-10-15: qty 2

## 2014-10-15 MED ORDER — CITALOPRAM HYDROBROMIDE 20 MG PO TABS
40.0000 mg | ORAL_TABLET | Freq: Every day | ORAL | Status: DC
  Administered 2014-10-16: 40 mg via ORAL
  Filled 2014-10-15: qty 2

## 2014-10-15 MED ORDER — ALBUTEROL SULFATE (2.5 MG/3ML) 0.083% IN NEBU: 2.5000 mg | INHALATION_SOLUTION | RESPIRATORY_TRACT | Status: DC | PRN

## 2014-10-15 MED ORDER — SODIUM CHLORIDE 0.9 % IJ SOLN: 3.0000 mL | INTRAMUSCULAR | Status: DC | PRN

## 2014-10-15 MED ORDER — LORAZEPAM 2 MG/ML IJ SOLN
0.5000 mg | Freq: Once | INTRAMUSCULAR | Status: DC
  Filled 2014-10-15: qty 1

## 2014-10-15 MED ORDER — ENOXAPARIN SODIUM 40 MG/0.4ML ~~LOC~~ SOLN
40.0000 mg | SUBCUTANEOUS | Status: DC
  Administered 2014-10-16 – 2014-10-17 (×2): 40 mg via SUBCUTANEOUS
  Filled 2014-10-15 (×2): qty 0.4

## 2014-10-15 MED ORDER — SIMVASTATIN 20 MG PO TABS
20.0000 mg | ORAL_TABLET | Freq: Every day | ORAL | Status: DC
  Administered 2014-10-16 – 2014-10-17 (×2): 20 mg via ORAL
  Filled 2014-10-15 (×2): qty 1

## 2014-10-15 MED ORDER — IPRATROPIUM-ALBUTEROL 0.5-2.5 (3) MG/3ML IN SOLN
3.0000 mL | Freq: Once | RESPIRATORY_TRACT | Status: AC
  Administered 2014-10-15: 3 mL via RESPIRATORY_TRACT
  Filled 2014-10-15: qty 3

## 2014-10-15 MED ORDER — ERLOTINIB HCL 25 MG PO TABS
25.0000 mg | ORAL_TABLET | Freq: Every day | ORAL | Status: DC
Start: 2014-10-16 — End: 2014-10-17
  Administered 2014-10-17: 25 mg via ORAL

## 2014-10-15 MED ORDER — ONDANSETRON HCL 4 MG PO TABS: 4.0000 mg | ORAL_TABLET | Freq: Four times a day (QID) | ORAL | Status: DC | PRN

## 2014-10-15 MED ORDER — ACETAMINOPHEN 325 MG PO TABS: 650.0000 mg | ORAL_TABLET | Freq: Four times a day (QID) | ORAL | Status: DC | PRN

## 2014-10-15 MED ORDER — SODIUM CHLORIDE 0.9 % IV SOLN
INTRAVENOUS | Status: DC
  Administered 2014-10-15: 23:00:00 via INTRAVENOUS

## 2014-10-15 NOTE — H&P (Signed)
PCP:   [name], MD   Chief Complaint:  Shortness of breath  HPI:  73 year old female who  has a past medical history of CVA (cerebral infarction); Stroke; Hypercholesteremia; Hypertension; Lung mass; Arthritis; TIA (transient ischemic attack); Pneumothorax; and Lung cancer. Today was brought to the hospital with chief complaint of shortness of breath for past 3 days. Patient lives by herself has been calling family of difficulty breathing with phlegm stuck in the throat. She denies chest pain, no nausea vomiting or diarrhea. No fever no dysuria urgency or frequency of urination. Patient does have history of lung cancer, and is on chemotherapy Tarceva. In the ED patient was found to have BNP 594.5, d-dimer 0.55, normal age adjusted d-dimer. 2-D echocardiogram from 2012 Showed EF 93-81%, grade 1 diastolic dysfunction. Patient is not hypoxic, O2 sats 98% on room air.  Allergies:  No Known Allergies    Past Medical History  Diagnosis Date  . CVA (cerebral infarction)     speech difficulities  . Stroke   . Hypercholesteremia     Takes Zocor daily  . Hypertension     takes Atenolol and Lisinopril daily  . Lung mass     left upper lobe  . Arthritis     hands  . TIA (transient ischemic attack)     Sat before thanksgiving;impaired speech  . Pneumothorax   . Lung cancer     Past Surgical History  Procedure Laterality Date  . No past surgeries    . Lung biopsy  10/13/11    left  . Abdominal hysterectomy  1975  . Varicose vein surgery  39yrs ago  . Video assisted thoracoscopy  11/18/2011    Procedure: VIDEO ASSISTED THORACOSCOPY;  Surgeon: Melrose Nakayama, MD;  Location: [facility];  Service: Thoracic;  Laterality: Left;  . Lobectomy  11/18/2011    Procedure: LOBECTOMY;  Surgeon: Melrose Nakayama, MD;  Location: [facility];  Service: Thoracic;  Laterality: Left;  LEFT UPPER LOBECTOMY  . Breast biopsy  early 2000    Prior to Admission medications   Medication Sig Start Date  End Date Taking? Authorizing Provider  amLODipine (NORVASC) 5 MG tablet Take 1 tablet (5 mg total) by mouth daily. 08/27/14  Yes Kathyrn Drown, MD  aspirin EC 81 MG tablet Take 81 mg by mouth every morning.   Yes Historical Provider, MD  atenolol (TENORMIN) 50 MG tablet Take 1 tablet (50 mg total) by mouth 1 day or 1 dose. 08/27/14  Yes Kathyrn Drown, MD  citalopram (CELEXA) 20 MG tablet Take 2 tablets (40 mg total) by mouth daily. 08/27/14 08/27/15 Yes Kathyrn Drown, MD  erlotinib (TARCEVA) 25 MG tablet Take 1 tablet (25 mg total) by mouth daily. Take on an empty stomach 1 hour before meals or 2 hours after. 06/25/14  Yes Farrel Gobble, MD  simvastatin (ZOCOR) 20 MG tablet Take 1 tablet (20 mg total) by mouth daily. 08/27/14  Yes Kathyrn Drown, MD  diclofenac (VOLTAREN) 75 MG EC tablet Take 1 tablet (75 mg total) by mouth 2 (two) times daily as needed. Patient not taking: Reported on 09/29/2014 08/27/14   Kathyrn Drown, MD  diphenoxylate-atropine ([name]) 2.5-0.025 MG per tablet Take 1 tablet by mouth 4 (four) times daily as needed for diarrhea or loose stools. 06/25/14   Farrel Gobble, MD  prochlorperazine (COMPAZINE) 10 MG tablet Take 1 tablet (10 mg total) by mouth every 6 (six) hours as needed (nausea/vomiting). Patient not taking: Reported on  10/15/2014 08/12/13   Farrel Gobble, MD    Social History:  reports that she has never smoked. She has never used smokeless tobacco. She reports that she does not drink alcohol or use illicit drugs.  Family History  Problem Relation Age of Onset  . Stroke Mother   . Stroke Sister   . Anesthesia problems Neg Hx   . Hypotension Neg Hx   . Malignant hyperthermia Neg Hx   . Pseudochol deficiency Neg Hx   . Diabetes Brother   . Cancer Brother   . Stroke Brother      All the positives are listed in BOLD  Review of Systems:  HEENT: Headache, blurred vision, runny nose, sore throat Neck: Hypothyroidism,  hyperthyroidism,,lymphadenopathy Chest : Shortness of breath, history of COPD, Asthma Heart : Chest pain, history of coronary arterey disease GI:  Nausea, vomiting, diarrhea, constipation, GERD GU: Dysuria, urgency, frequency of urination, hematuria Neuro: Stroke, seizures, syncope Psych: Depression, anxiety, hallucinations   Physical Exam: Blood pressure 132/60, pulse 62, temperature 97.7 F (36.5 C), temperature source Oral, resp. rate 16, height 5\' 7"  (1.702 m), weight 63.504 kg (140 lb), SpO2 97 %. Constitutional:   Patient is a well-developed and well-nourished female* in no acute distress and cooperative with exam. Head: Normocephalic and atraumatic Mouth: Mucus membranes moist Eyes: PERRL, EOMI, conjunctivae normal Neck: Supple, No Thyromegaly Cardiovascular: RRR, S1 normal, S2 normal Pulmonary/Chest: Bibasilar crackles Abdominal: Soft. Non-tender, non-distended, bowel sounds are normal, no masses, organomegaly, or guarding present.  Neurological: A&O x3, Strength is normal and symmetric bilaterally, cranial nerve II-XII are grossly intact, no focal motor deficit, sensory intact to light touch bilaterally.  Extremities : No Cyanosis, Clubbing or Edema  Labs on Admission:  Basic Metabolic Panel:  Recent Labs Lab 10/15/14 2050  NA 142  K 3.6*  CL 104  CO2 26  GLUCOSE 97  BUN 12  CREATININE 1.39*  CALCIUM 9.2   Liver Function Tests: No results for input(s): AST, ALT, ALKPHOS, BILITOT, PROT, ALBUMIN in the last 168 hours. No results for input(s): LIPASE, AMYLASE in the last 168 hours. No results for input(s): AMMONIA in the last 168 hours. CBC:  Recent Labs Lab 10/15/14 2050  WBC 5.1  NEUTROABS 3.2  HGB 12.7  HCT 37.8  MCV 96.7  PLT 167   Cardiac Enzymes:  Recent Labs Lab 10/15/14 2050  TROPONINI <0.30    BNP (last 3 results)  Recent Labs  10/15/14 2050  PROBNP 594.5*   CBG: No results for input(s): GLUCAP in the last 168 hours.  Radiological  Exams on Admission: Dg Chest 2 View  10/15/2014   CLINICAL DATA:  Shortness of breath for 3 days. History of lung cancer.  EXAM: CHEST  2 VIEW  COMPARISON:  05/26/2014 and 02/07/2014  FINDINGS: Chronic volume loss in the left hemithorax. Prominent lung markings appear chronic. Increased densities in the left lower chest could be related to volume loss. Difficult to exclude new findings in this region. No large pleural effusions. Limited evaluation of the cardiac silhouette.  IMPRESSION: Chronic lung changes with chronic volume loss in the left hemithorax.  Increased densities in the left lower chest are probably related to volume loss.   Electronically Signed   By: Markus Daft M.D.   On: 10/15/2014 21:30    EKG: Independently reviewed. Sinus rhythm   Assessment/Plan Principal Problem:   Diastolic CHF, acute Active Problems:   HTN (hypertension)   Bronchitis  Acute diastolic CHF Patient has bibasilar crackles with  elevated BNP area does start the patient on Lasix 20 g IV every 12 hours, will insert Foley catheter to gravity. Check I's and O's. Follow BMP in the morning  Hypertension Continue atenolol, Norvasc.  Depression Continue Celexa.  DVT prophylaxis Lovenox  Code status: DNR  Family discussion: Admission, patients condition and plan of care including tests being ordered have been discussed with the patient and *her daughter at bedside who indicate understanding and agree with the plan and Code Status.   Time Spent on Admission: 60 min  [facility] Pager: [phone] 10/15/2014, 11:19 PM  If 7PM-7AM, please contact night-coverage  [email or web address]  Password [email or web address]

## 2014-10-15 NOTE — ED Provider Notes (Signed)
CSN: [id number]     Arrival date & time 10/15/14  1946 History  This chart was scribed for Richarda Blade, MD by Rayfield Citizen, ED Scribe. This patient was seen in room APA09/APA09 and the patient's care was started at 8:28 PM.    Chief Complaint  Patient presents with  . Shortness of Breath   Patient is a 73 y.o. female presenting with shortness of breath. The history is provided by the patient and a relative. No language interpreter was used.  Shortness of Breath Associated symptoms: cough      HPI Comments: Stephanie [name] is a 73 y.o. female who presents to the Emergency Department complaining of 3 days of SOB. Patient, who lives alone, has been calling family complaining of difficulty breathing and the sensation of "something in her throat." She denies any other concerns at this time. Patient explains that she "eats when she wants to;" family is unsure if this means she is eating well.   Patient has a history of stroke; her pattern of speech is baseline. She can normally ambulate with assistance. She lives alone at this time.   Patient has been on chemo meds for 5-6 months.   Past Medical History  Diagnosis Date  . CVA (cerebral infarction)     speech difficulities  . Stroke   . Hypercholesteremia     Takes Zocor daily  . Hypertension     takes Atenolol and Lisinopril daily  . Lung mass     left upper lobe  . Arthritis     hands  . TIA (transient ischemic attack)     Sat before thanksgiving;impaired speech  . Pneumothorax   . Lung cancer    Past Surgical History  Procedure Laterality Date  . No past surgeries    . Lung biopsy  10/13/11    left  . Abdominal hysterectomy  1975  . Varicose vein surgery  94yrs ago  . Video assisted thoracoscopy  11/18/2011    Procedure: VIDEO ASSISTED THORACOSCOPY;  Surgeon: Melrose Nakayama, MD;  Location: [facility];  Service: Thoracic;  Laterality: Left;  . Lobectomy  11/18/2011    Procedure: LOBECTOMY;  Surgeon: Melrose Nakayama, MD;   Location: [facility];  Service: Thoracic;  Laterality: Left;  LEFT UPPER LOBECTOMY  . Breast biopsy  early 2000   Family History  Problem Relation Age of Onset  . Stroke Mother   . Stroke Sister   . Anesthesia problems Neg Hx   . Hypotension Neg Hx   . Malignant hyperthermia Neg Hx   . Pseudochol deficiency Neg Hx   . Diabetes Brother   . Cancer Brother   . Stroke Brother    History  Substance Use Topics  . Smoking status: Never Smoker   . Smokeless tobacco: Never Used  . Alcohol Use: No   OB History    No data available     Review of Systems  Respiratory: Positive for cough and shortness of breath.   All other systems reviewed and are negative.  Allergies  Review of patient's allergies indicates no known allergies.  Home Medications   Prior to Admission medications   Medication Sig Start Date End Date Taking? Authorizing Provider  amLODipine (NORVASC) 5 MG tablet Take 1 tablet (5 mg total) by mouth daily. 08/27/14  Yes Kathyrn Drown, MD  aspirin EC 81 MG tablet Take 81 mg by mouth every morning.   Yes Historical Provider, MD  atenolol (TENORMIN) 50 MG tablet  Take 1 tablet (50 mg total) by mouth 1 day or 1 dose. 08/27/14  Yes Kathyrn Drown, MD  citalopram (CELEXA) 20 MG tablet Take 2 tablets (40 mg total) by mouth daily. 08/27/14 08/27/15 Yes Kathyrn Drown, MD  erlotinib (TARCEVA) 25 MG tablet Take 1 tablet (25 mg total) by mouth daily. Take on an empty stomach 1 hour before meals or 2 hours after. 06/25/14  Yes Farrel Gobble, MD  simvastatin (ZOCOR) 20 MG tablet Take 1 tablet (20 mg total) by mouth daily. 08/27/14  Yes Kathyrn Drown, MD  diclofenac (VOLTAREN) 75 MG EC tablet Take 1 tablet (75 mg total) by mouth 2 (two) times daily as needed. Patient not taking: Reported on 09/29/2014 08/27/14   Kathyrn Drown, MD  diphenoxylate-atropine ([name]) 2.5-0.025 MG per tablet Take 1 tablet by mouth 4 (four) times daily as needed for diarrhea or loose stools. 06/25/14   Farrel Gobble, MD  prochlorperazine (COMPAZINE) 10 MG tablet Take 1 tablet (10 mg total) by mouth every 6 (six) hours as needed (nausea/vomiting). Patient not taking: Reported on 10/15/2014 08/12/13   Farrel Gobble, MD   BP 132/60 mmHg  Pulse 62  Temp(Src) 97.7 F (36.5 C) (Oral)  Resp 16  Ht 5\' 7"  (1.702 m)  Wt 140 lb (63.504 kg)  BMI 21.92 kg/m2  SpO2 97% Physical Exam  Constitutional: She is oriented to person, place, and time. She appears well-developed and well-nourished.  HENT:  Head: Normocephalic and atraumatic.  Eyes: Conjunctivae and EOM are normal. Pupils are equal, round, and reactive to light.  Neck: Normal range of motion and phonation normal. Neck supple.  Cardiovascular: Normal rate and regular rhythm.   Murmur (Soft, systolic ) heard. Pulmonary/Chest: Effort normal. She has wheezes. She has rales. She exhibits no tenderness.  Decreased air movement left base with rales and rhonchi and a few scattered wheezes  Abdominal: Soft. She exhibits no distension. There is no tenderness. There is no guarding.  Musculoskeletal: Normal range of motion.  Neurological: She is alert and oriented to person, place, and time. She exhibits normal muscle tone.  Skin: Skin is warm and dry.  Psychiatric: She has a normal mood and affect. Her behavior is normal. Judgment and thought content normal.  Nursing note and vitals reviewed.   ED Course  Procedures   DIAGNOSTIC STUDIES: Oxygen Saturation is 93% on RA, low by my interpretation. *Patient on [place] 2L/min during exam.   COORDINATION OF CARE:  Medications  ipratropium-albuterol (DUONEB) 0.5-2.5 (3) MG/3ML nebulizer solution 3 mL (3 mLs Nebulization Given 10/15/14 2144)    Patient Vitals for the past 24 hrs:  BP Temp Temp src Pulse Resp SpO2 Height Weight  10/15/14 2146 - - - - - 97 % - -  10/15/14 1950 132/60 mmHg 97.7 F (36.5 C) Oral 62 16 93 % 5\' 7"  (1.702 m) 140 lb (63.504 kg)   8:35 PM Discussed treatment plan with pt at  bedside and pt agreed to plan.  10:47 PM  Upon exam, somewhat improved air movement bilaterally. No trouble breathing at this time. Daughter explains that patient has been agitated and she appears agitated during discussion.  The patient states that she feels better at this time.    Labs Review Labs Reviewed  D-DIMER, QUANTITATIVE - Abnormal; Notable for the following:    D-Dimer, Quant 0.55 (*)    All other components within normal limits  BASIC METABOLIC PANEL - Abnormal; Notable for the following:    Potassium  3.6 (*)    Creatinine, Ser 1.39 (*)    GFR calc non Af Amer 37 (*)    GFR calc Af Amer 43 (*)    All other components within normal limits  PRO B NATRIURETIC PEPTIDE - Abnormal; Notable for the following:    Pro B Natriuretic peptide (BNP) 594.5 (*)    All other components within normal limits  URINALYSIS, ROUTINE W REFLEX MICROSCOPIC - Abnormal; Notable for the following:    Specific Gravity, Urine >1.030 (*)    Hgb urine dipstick MODERATE (*)    Leukocytes, UA TRACE (*)    All other components within normal limits  URINE MICROSCOPIC-ADD ON - Abnormal; Notable for the following:    Squamous Epithelial / LPF FEW (*)    All other components within normal limits  URINE CULTURE  CBC WITH DIFFERENTIAL  TROPONIN I    Imaging Review Dg Chest 2 View  10/15/2014   CLINICAL DATA:  Shortness of breath for 3 days. History of lung cancer.  EXAM: CHEST  2 VIEW  COMPARISON:  05/26/2014 and 02/07/2014  FINDINGS: Chronic volume loss in the left hemithorax. Prominent lung markings appear chronic. Increased densities in the left lower chest could be related to volume loss. Difficult to exclude new findings in this region. No large pleural effusions. Limited evaluation of the cardiac silhouette.  IMPRESSION: Chronic lung changes with chronic volume loss in the left hemithorax.  Increased densities in the left lower chest are probably related to volume loss.   Electronically Signed   By:  Markus Daft M.D.   On: 10/15/2014 21:30     EKG Interpretation  Date/Time:  Wednesday October 15 2014 20:46:57 EST Ventricular Rate:  71 PR Interval:  184 QRS Duration: 90 QT Interval:  443 QTC Calculation: 481 R Axis:   93 Text Interpretation:  Sinus rhythm Right axis deviation Since last tracing QRS axis shift Confirmed by Eulis Foster  MD, Vira Agar ([id number]) on 10/15/2014 9:05:43 PM      MDM   Final diagnoses:  Bronchitis  Agitation    Bronchitis, with mild respiratory distress, history of lung cancer.  Chest x-ray is abnormal, but does not indicate an acute infiltrate.  She has a history of lung cancer.  She has a normal age adjusted d-dimer.  I doubt pneumonia, PE, serious bacterial infection or metabolic instability.  Nursing Notes Reviewed/ Care Coordinated, and agree without changes. Applicable Imaging Reviewed.  Interpretation of Laboratory Data incorporated into ED treatment    I personally performed the services described in this documentation, which was scribed in my presence. The recorded information has been reviewed and is accurate.       Richarda Blade, MD 10/16/14 0001

## 2014-10-15 NOTE — ED Notes (Signed)
Pt. Could not use restroom at this time.

## 2014-10-15 NOTE — ED Notes (Signed)
Pt"s daughter states she called pt a little after 5pm this evening and her mother mentioned being short of breath. Stated this happened Monday as well but she "got better on her own". Pt states she feels like something is "stuck in her throat".

## 2014-10-16 ENCOUNTER — Inpatient Hospital Stay (HOSPITAL_COMMUNITY): Payer: Medicare Other

## 2014-10-16 DIAGNOSIS — R0600 Dyspnea, unspecified: Secondary | ICD-10-CM | POA: Diagnosis present

## 2014-10-16 DIAGNOSIS — I059 Rheumatic mitral valve disease, unspecified: Secondary | ICD-10-CM

## 2014-10-16 DIAGNOSIS — C3492 Malignant neoplasm of unspecified part of left bronchus or lung: Secondary | ICD-10-CM

## 2014-10-16 DIAGNOSIS — N189 Chronic kidney disease, unspecified: Secondary | ICD-10-CM

## 2014-10-16 DIAGNOSIS — N179 Acute kidney failure, unspecified: Secondary | ICD-10-CM | POA: Diagnosis present

## 2014-10-16 LAB — CBC
HCT: 37.4 % (ref 36.0–46.0)
HEMOGLOBIN: 12.8 g/dL (ref 12.0–15.0)
MCH: 32.9 pg (ref 26.0–34.0)
MCHC: 34.2 g/dL (ref 30.0–36.0)
MCV: 96.1 fL (ref 78.0–100.0)
Platelets: 161 10*3/uL (ref 150–400)
RBC: 3.89 MIL/uL (ref 3.87–5.11)
RDW: 12.4 % (ref 11.5–15.5)
WBC: 3.6 10*3/uL — ABNORMAL LOW (ref 4.0–10.5)

## 2014-10-16 LAB — COMPREHENSIVE METABOLIC PANEL
ALBUMIN: 3.4 g/dL — AB (ref 3.5–5.2)
ALK PHOS: 80 U/L (ref 39–117)
ALT: 10 U/L (ref 0–35)
AST: 21 U/L (ref 0–37)
Anion gap: 12 (ref 5–15)
BUN: 11 mg/dL (ref 6–23)
CO2: 26 mEq/L (ref 19–32)
Calcium: 9.4 mg/dL (ref 8.4–10.5)
Chloride: 104 mEq/L (ref 96–112)
Creatinine, Ser: 1.37 mg/dL — ABNORMAL HIGH (ref 0.50–1.10)
GFR calc Af Amer: 43 mL/min — ABNORMAL LOW (ref >90)
GFR calc non Af Amer: 38 mL/min — ABNORMAL LOW (ref >90)
GLUCOSE: 173 mg/dL — AB (ref 70–99)
Potassium: 3.9 mEq/L (ref 3.7–5.3)
SODIUM: 142 meq/L (ref 137–147)
TOTAL PROTEIN: 7.1 g/dL (ref 6.0–8.3)
Total Bilirubin: 0.6 mg/dL (ref 0.3–1.2)

## 2014-10-16 MED ORDER — FUROSEMIDE 10 MG/ML IJ SOLN
20.0000 mg | Freq: Two times a day (BID) | INTRAMUSCULAR | Status: DC
  Administered 2014-10-16: 20 mg via INTRAVENOUS
  Filled 2014-10-16: qty 2

## 2014-10-16 MED ORDER — CITALOPRAM HYDROBROMIDE 20 MG PO TABS
20.0000 mg | ORAL_TABLET | Freq: Every day | ORAL | Status: DC
  Administered 2014-10-17: 20 mg via ORAL
  Filled 2014-10-16: qty 1

## 2014-10-16 NOTE — Evaluation (Signed)
Physical Therapy Evaluation Patient Details Name: Stephanie [name] MRN: [id number] DOB: 11/03/1940 Today's Date: 10/16/2014   History of Present Illness  73 year old female who  has a past medical history of CVA (cerebral infarction); Stroke; Hypercholesteremia; Hypertension; Lung mass; Arthritis; TIA (transient ischemic attack); Pneumothorax; and Lung cancer.  She was admitted due to c/o shortness of breath.  Daughter states that this happens intermittently.  She has been diagnosed with CHF.    Pt had a stroke in 2010 for which she has residual expressive aphasia.  Daughter states that pt seems to be having progressive difficulty in word finding and often gets confused.  She is able to communicate effectively, though slowly.  She is also on Chemo drugs due to lung CA.  She lives alone and had an aide visit 3 times a week.  In addition, family has been coming in intermittently to assist.  Daughter states that pt has had some falls in the recent past.  Clinical Impression   Pt was seen for evaluation.  She was found to be alert and very cooperative.  She was able to follow all directions but it would sometimes take her several attempts before understanding me.  She is found to be generally deconditioned and has significant difficulty rising from sitting.  Her standing balance is compromised and she tends to fall backwards.  During gait with a walker, she loses her balance with turns and had to be stabilized by the therapist.  This pt is not safe to be living alone.  She would be appropriate for short term SNF.  Pt and daughters are agreeable.    Follow Up Recommendations SNF    Equipment Recommendations    none   Recommendations for Other Services   OT    Precautions / Restrictions Precautions Precautions: Fall Restrictions Weight Bearing Restrictions: No      Mobility  Bed Mobility Overal bed mobility: Modified Independent                Transfers Overall transfer level: Needs  assistance Equipment used: Rolling walker (2 wheeled) Transfers: Sit to/from Stand Sit to Stand: Mod assist         General transfer comment:  pt made 4 attempts to stand with no success...bed was elevated and she still needed mod assist to assume standing position...she tends to lean posteriorly upon standing and needs stabilization  Ambulation/Gait Ambulation/Gait assistance: Min assist Ambulation Distance (Feet): 80 Feet Assistive device: Rolling walker (2 wheeled) Gait Pattern/deviations: Trunk flexed;Decreased dorsiflexion - right;Decreased dorsiflexion - left   Gait velocity interpretation: Below normal speed for age/gender General Gait Details:  pt needs verbal and manual assist for correct turns with walker...she tends to turn sharply and throws herself off balance, needin stabilization by therapist  Stairs            Wheelchair Mobility    Modified Rankin (Stroke Patients Only)       Balance Overall balance assessment: Needs assistance Sitting-balance support: No upper extremity supported;Feet supported Sitting balance-Leahy Scale: Good     Standing balance support: No upper extremity supported Standing balance-Leahy Scale: Fair                               Pertinent Vitals/Pain Pain Assessment: No/denies pain    Home Living Family/patient expects to be discharged to:: Skilled nursing facility  Prior Function Level of Independence: Needs assistance   Gait / Transfers Assistance Needed: able to ambulate in the home with a 4 wheeled walker, uses a lift chair when possible  ADL's / Homemaking Assistance Needed: pt able to dress herself and feed herself but needs assist with all other ADLs        Hand Dominance   Dominant Hand: Right    Extremity/Trunk Assessment   Upper Extremity Assessment: RUE deficits/detail RUE Deficits / Details: RUE postured in flexor pattern with minimal use of this extremity          Lower Extremity Assessment: Generalized weakness (weakness is noted functionally to 3/5)      Cervical / Trunk Assessment: Kyphotic  Communication   Communication: Expressive difficulties  Cognition Arousal/Alertness: Awake/alert Behavior During Therapy: WFL for tasks assessed/performed Overall Cognitive Status: Within Functional Limits for tasks assessed                      General Comments      Exercises        Assessment/Plan    PT Assessment Patient needs continued PT services  PT Diagnosis Generalized weakness;Hemiplegia dominant side   PT Problem List Decreased strength;Decreased activity tolerance;Decreased balance;Decreased mobility  PT Treatment Interventions Gait training;Functional mobility training;Therapeutic exercise;Balance training   PT Goals (Current goals can be found in the Care Plan section) Acute Rehab PT Goals Patient Stated Goal: wants to return home PT Goal Formulation: With patient/family Time For Goal Achievement: 10/30/14 Potential to Achieve Goals: Good    Frequency Min 3X/week   Barriers to discharge  lives alone      Co-evaluation               End of Session Equipment Utilized During Treatment: Gait belt Activity Tolerance: Patient tolerated treatment well Patient left: in bed;with call bell/phone within reach;with bed alarm set           Time: 9767-3419 PT Time Calculation (min) (ACUTE ONLY): 49 min   Charges:   PT Evaluation $Initial PT Evaluation Tier I: 1 Procedure     PT G CodesDemetrios Isaacs L 10/16/2014, 10:27 AM

## 2014-10-16 NOTE — Clinical Social Work Placement (Signed)
Clinical Social Work Department CLINICAL SOCIAL WORK PLACEMENT NOTE 10/16/2014  Patient:  Stephanie [name], Stephanie [name]  Account Number:  [id number] Admit date:  10/15/2014  Clinical Social Worker:  Benay Pike, LCSW  Date/time:  10/16/2014 10:46 AM  Clinical Social Work is seeking post-discharge placement for this patient at the following level of care:   [facility]   (*CSW will update this form in Epic as items are completed)   10/16/2014  Patient/family provided with [facility] Department of Clinical Social Work's list of facilities offering this level of care within the geographic area requested by the patient (or if unable, by the patient's family).  10/16/2014  Patient/family informed of their freedom to choose among providers that offer the needed level of care, that participate in Medicare, Medicaid or managed care program needed by the patient, have an available bed and are willing to accept the patient.  10/16/2014  Patient/family informed of [facility]' ownership interest in [facility], as well as of the fact that they are under no obligation to receive care at this facility.  PASARR submitted to EDS on 10/16/2014 PASARR number received on 10/16/2014  FL2 transmitted to all facilities in geographic area requested by pt/family on  10/16/2014 FL2 transmitted to all facilities within larger geographic area on   Patient informed that his/her managed care company has contracts with or will negotiate with  certain facilities, including the following:     Patient/family informed of bed offers received:   Patient chooses bed at  Physician recommends and patient chooses bed at    Patient to be transferred to  on   Patient to be transferred to facility by  Patient and family notified of transfer on  Name of family member notified:    The following physician request were entered in Epic:   Additional Comments:  Benay Pike, [place]

## 2014-10-16 NOTE — Clinical Social Work Placement (Signed)
Clinical Social Work Department CLINICAL SOCIAL WORK PLACEMENT NOTE 10/16/2014  Patient:  Stephanie [name], Stephanie [name]  Account Number:  [id number] Admit date:  10/15/2014  Clinical Social Worker:  Benay Pike, LCSW  Date/time:  10/16/2014 10:46 AM  Clinical Social Work is seeking post-discharge placement for this patient at the following level of care:   [facility]   (*CSW will update this form in Epic as items are completed)   10/16/2014  Patient/family provided with [place] Department of Clinical Social Work's list of facilities offering this level of care within the geographic area requested by the patient (or if unable, by the patient's family).  10/16/2014  Patient/family informed of their freedom to choose among providers that offer the needed level of care, that participate in Medicare, Medicaid or managed care program needed by the patient, have an available bed and are willing to accept the patient.  10/16/2014  Patient/family informed of [facility]' ownership interest in [facility], as well as of the fact that they are under no obligation to receive care at this facility.  PASARR submitted to EDS on 10/16/2014 PASARR number received on 10/16/2014  FL2 transmitted to all facilities in geographic area requested by pt/family on  10/16/2014 FL2 transmitted to all facilities within larger geographic area on   Patient informed that his/her managed care company has contracts with or will negotiate with  certain facilities, including the following:     Patient/family informed of bed offers received:  10/16/2014 Patient chooses bed at [facility] Physician recommends and patient chooses bed at  [facility]  Patient to be transferred to  on   Patient to be transferred to facility by  Patient and family notified of transfer on  Name of family member notified:    The following physician request were entered in Epic:   Additional Comments:  Benay Pike,  [place]

## 2014-10-16 NOTE — Progress Notes (Signed)
[facility] PROGRESS NOTE  Stephanie [name] KYH:[id number] DOB: 05-22-1941 DOA: 10/15/2014 PCP: [name], MD  Assessment/Plan:  Worsening dyspnea Likely multifactorial i.e. Acute diastolic CHF in setting of progressive nodular disease (adenocarcinoma of there lung s/p thoracoscopic lobectomy 2013). Reports improved respiratory effort this am. Continues with fine crackles on exam but does not appear volume overloaded. Volume status negative 400 since IV lasix, but intake and output data unreliable at this point. Family reports a gradual worsening of dyspnea over recent 6 months. Will obtain 2 decho for evaluation of LV function. 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    Acute diastolic CHF Patient has bibasilar crackles with mildly elevated BNP. Will obtain 2 decho. Last echo 2012 with EF 55-60% and grade 1 diastolic dysfunction.  Will consider discontinuing IV lasix. See #1.   Acute on chronic renal failure Likely related to IV lasix. Current creatinine 1.37 and baseline appears to be 1.2. Urine output good. Anticipate reducing lasix today. Will monitor  Adenocarcinoma of lung Last seen by onclogy 09/29/14. Note at that time indicates progressive nodular disease with minimal symptoms in setting of non-specific interstitial pneumonitis. Family reports gradual decline in respiratory status over last 6 months. On Tarceva 25 daily started 05/2014 due to worsening findings on CT scan done 04/2014 per chart review. Note indicates may need CAT scan  CVA Left cerebral with right hemiparesis. Stable at baseline. Has had HH PT and OT. Will request PT eval. Family reports recent fall (mechanical)  Hypertension  Controlled. Continue atenolol, Norvasc.  Depression  Stable at baseline Continue Celexa.   Code Status: DNR Family Communication: daughter at bedside Disposition Plan: home when ready may benefit from snf. Family reports she "makes too much money  to get into assisted living"   Consultants:    Procedures:  echo  Antibiotics:  none  HPI/Subjective: Alert. Denies pain/disocomfort. Reports breathing better  Objective: Filed Vitals:   10/16/14 0533  BP: 129/47  Pulse: 58  Temp: 97.7 F (36.5 C)  Resp: 16    Intake/Output Summary (Last 24 hours) at 10/16/14 0912 Last data filed at 10/16/14 0536  Gross per 24 hour  Intake      0 ml  Output    400 ml  Net   -400 ml   Filed Weights   10/15/14 1950 10/16/14 0536  Weight: 63.504 kg (140 lb) 68.266 kg (150 lb 8 oz)    Exam:   General:  Thin somewhat frail appearing, in no acute distress  Cardiovascular: RRR +murmur no LE edema  Respiratory: normal effort with conversation distinct crackles bilateral bases no wheeze no rhonchi BS distant  Abdomen: soft non-distended +BS  Musculoskeletal: joints without swelling or erythema  Neuro: right sided hemiparesis. Speech slow and deliberate   Data Reviewed: Basic Metabolic Panel:  Recent Labs Lab 10/15/14 2050 10/16/14 0547  NA 142 142  K 3.6* 3.9  CL 104 104  CO2 26 26  GLUCOSE 97 173*  BUN 12 11  CREATININE 1.39* 1.37*  CALCIUM 9.2 9.4   Liver Function Tests:  Recent Labs Lab 10/16/14 0547  AST 21  ALT 10  ALKPHOS 80  BILITOT 0.6  PROT 7.1  ALBUMIN 3.4*   No results for input(s): LIPASE, AMYLASE in the last 168 hours. No results for input(s): AMMONIA in the last 168 hours. CBC:  Recent Labs Lab 10/15/14 2050 10/16/14 0547  WBC 5.1 3.6*  NEUTROABS 3.2  --   HGB 12.7 12.8  HCT 37.8 37.4  MCV 96.7 96.1  PLT 167 161   Cardiac Enzymes:  Recent Labs Lab 10/15/14 2050  TROPONINI <0.30   BNP (last 3 results)  Recent Labs  10/15/14 2050  PROBNP 594.5*   CBG: No results for input(s): GLUCAP in the last 168 hours.  No results found for this or any previous visit (from the past 240 hour(s)).   Studies: Dg Chest 2 View  10/15/2014   CLINICAL DATA:  Shortness of breath for  3 days. History of lung cancer.  EXAM: CHEST  2 VIEW  COMPARISON:  05/26/2014 and 02/07/2014  FINDINGS: Chronic volume loss in the left hemithorax. Prominent lung markings appear chronic. Increased densities in the left lower chest could be related to volume loss. Difficult to exclude new findings in this region. No large pleural effusions. Limited evaluation of the cardiac silhouette.  IMPRESSION: Chronic lung changes with chronic volume loss in the left hemithorax.  Increased densities in the left lower chest are probably related to volume loss.   Electronically Signed   By: Markus Daft M.D.   On: 10/15/2014 21:30    Scheduled Meds: . amLODipine  5 mg Oral Daily  . aspirin EC  81 mg Oral q morning - 10a  . atenolol  50 mg Oral 1 day or 1 dose  . citalopram  40 mg Oral Daily  . enoxaparin (LOVENOX) injection  40 mg Subcutaneous Q24H  . erlotinib  25 mg Oral Daily  . furosemide  20 mg Intravenous BID  . guaiFENesin  1,200 mg Oral BID  . simvastatin  20 mg Oral Daily  . sodium chloride  3 mL Intravenous Q12H   Continuous Infusions:   Principal Problem:   Diastolic CHF, acute Active Problems:   HTN (hypertension)   Adenocarcinoma of left lung, stage 1   Bilateral carotid artery disease   Hyperlipidemia   Bronchitis   Dyspnea   Acute on chronic renal failure    Time spent: 40 minutes    [facility] Pager [phone]. If 7PM-7AM, please contact night-coverage at [email or web address], password [facility] 10/16/2014, 9:12 AM  LOS: 1 day     Addendum  Patient is a pleasant 73 year old female with a history of lung cancer admitted to the medicine service on 10/15/2014 when she presented with complaints of shortness of breath, felt to be secondary to acute on chronic diastolic congestive heart failure given the presence of crackles on lung exam along with BNP of 594.5 100 on Lasix 20 mg IV twice a day. CT scan of lungs performed on 10/16/2014 showing stable nodules in right  lung, no definite new pulmonary nodules noted stable reticular peripheral interstitial prominence bilaterally consistent with fibrotic changes. Transthoracic echocardiogram performed on 10/16/2014 showing EF of 67-12% with diastolic dysfunction. On lung exam she continues to have bibasilar crackles, expiratory wheezes, rhonchi, coarse respiratory sounds. Will continue Lasix 20 mg IV twice a day, although I suspect much of her dyspnea is likely due to underlying lung cancer.

## 2014-10-16 NOTE — Clinical Social Work Psychosocial (Signed)
Clinical Social Work Department BRIEF PSYCHOSOCIAL ASSESSMENT 10/16/2014  Patient:  Stephanie [name], Stephanie [name]     Account Number:  [id number]     Admit date:  10/15/2014  Clinical Social Worker:  Wyatt Haste  Date/Time:  10/16/2014 10:48 AM  Referred by:  CSW  Date Referred:  10/16/2014 Referred for  SNF Placement   Other Referral:   Interview type:  Patient Other interview type:   daughters- [name] and Sissy    PSYCHOSOCIAL DATA Living Status:  ALONE Admitted from facility:   Level of care:   Primary support name:  Kim Primary support relationship to patient:  CHILD, ADULT Degree of support available:   supportive    CURRENT CONCERNS Current Concerns  Post-Acute Placement   Other Concerns:    SOCIAL WORK ASSESSMENT / PLAN CSW met with pt and pt's daughters [name] and Sissy at bedside. Pt came to ED due to shortness of breath x3 days. Admitted with CHF. Pt lives alone in an apartment. She pays a niece to come assist her for 5 hours a day, 3 days a week. Kim lives in [place] and [place] lives in [place]. Pt also has a son who works out of town. Family check in with pt regularly and typically spend Saturdays with her. [name] indicates pt was diagnosed with lung cancer in 2010. She is currently taking a chemo pill. Pt has been having more difficulty at home recently with several falls and increased weakness, particularly the last week. She ambulates with a walker at baseline. PT evaluated pt today and recommendation is for SNF. CSW discussed placement process and provided SNF list. They are aware of [facility] Medicare authorization. Pt and daughters agreeable to [facility] only at this point. Pt became upset when discussing a back up plan. Daughters feel that [facility] would be best choice. CSW will initiate bed search and [facility] authorization.   Assessment/plan status:  Psychosocial Support/Ongoing Assessment of Needs Other assessment/ plan:   Information/referral to community resources:    SNF list    PATIENT'S/FAMILY'S RESPONSE TO PLAN OF CARE: CSW agreed to discuss other options with pt if [facility] is unable to accept her. Will follow up.       Benay Pike, [place]

## 2014-10-16 NOTE — Clinical Social Work Note (Addendum)
Pt and family accept bed at [facility]. They are aware that pt will have to bring chemo pills from home. Facility notified. [employer] will call authorization to [facility].  Benay Pike, [place]

## 2014-10-16 NOTE — Progress Notes (Signed)
Foley not placed per MD order.  Patient able to call for assistance to [facility].  Able to measure urine.  Also, foley has what appears to be a prolapse in her genital area.

## 2014-10-16 NOTE — Care Management Note (Addendum)
    Page 1 of 1   10/17/2014     1:47:30 PM CARE MANAGEMENT NOTE 10/17/2014  Patient:  Stephanie [name], Stephanie [name]   Account Number:  [id number]  Date Initiated:  10/16/2014  Documentation initiated by:  CHILDRESS,JESSICA  Subjective/Objective Assessment:   Pt is from home, lives alone, admitted with CHF. Pt plans to discharge to SNF. CSW aware of discharge plan and will arrange for placement. No CM needs.     Action/Plan:   Anticipated DC Date:  10/18/2014   Anticipated DC Plan:  SKILLED NURSING FACILITY  In-house referral  Clinical Social Worker      DC Planning Services  CM consult      Choice offered to / List presented to:             Status of service:  Completed, signed off Medicare Important Message given?  YES (If response is "NO", the following Medicare IM given date fields will be blank) Date Medicare IM given:  10/17/2014 Medicare IM given by:  Jolene Provost Date Additional Medicare IM given:   Additional Medicare IM given by:    Discharge Disposition:  [place]  Per UR Regulation:  Reviewed for med. necessity/level of care/duration of stay  If discussed at [place] of Stay Meetings, dates discussed:    Comments:  10/17/2014 [place], RN, MSN, PCCN Pt will be discharged to SNF for rehab. CSW will arrange for placement at [facility] facility. No CM needs at this time. Pt and pt's daughters aware and agreeable to discharge plan.  10/16/2014 [place], RN, MSN, [facility]

## 2014-10-16 NOTE — Progress Notes (Signed)
  Echocardiogram 2D Echocardiogram has been performed.  [place], [place] 10/16/2014, 12:16 PM

## 2014-10-16 NOTE — Care Management Utilization Note (Signed)
UR complete 

## 2014-10-17 ENCOUNTER — Inpatient Hospital Stay
Admission: RE | Admit: 2014-10-17 | Discharge: 2015-03-16 | Disposition: A | Discharge status: Nursing Home (Includes ICF) | Payer: Medicare Other | Source: Ambulatory Visit | Attending: Internal Medicine | Admitting: Internal Medicine

## 2014-10-17 DIAGNOSIS — R0989 Other specified symptoms and signs involving the circulatory and respiratory systems: Principal | ICD-10-CM

## 2014-10-17 LAB — BASIC METABOLIC PANEL
Anion gap: 12 (ref 5–15)
BUN: 15 mg/dL (ref 6–23)
CHLORIDE: 102 meq/L (ref 96–112)
CO2: 28 meq/L (ref 19–32)
CREATININE: 1.51 mg/dL — AB (ref 0.50–1.10)
Calcium: 9.1 mg/dL (ref 8.4–10.5)
GFR calc Af Amer: 39 mL/min — ABNORMAL LOW (ref >90)
GFR calc non Af Amer: 33 mL/min — ABNORMAL LOW (ref >90)
Glucose, Bld: 103 mg/dL — ABNORMAL HIGH (ref 70–99)
Potassium: 3.6 mEq/L — ABNORMAL LOW (ref 3.7–5.3)
Sodium: 142 mEq/L (ref 137–147)

## 2014-10-17 LAB — URINE CULTURE

## 2014-10-17 LAB — CBC
HEMATOCRIT: 36.9 % (ref 36.0–46.0)
HEMOGLOBIN: 12.5 g/dL (ref 12.0–15.0)
MCH: 32.6 pg (ref 26.0–34.0)
MCHC: 33.9 g/dL (ref 30.0–36.0)
MCV: 96.3 fL (ref 78.0–100.0)
Platelets: 169 10*3/uL (ref 150–400)
RBC: 3.83 MIL/uL — ABNORMAL LOW (ref 3.87–5.11)
RDW: 12.5 % (ref 11.5–15.5)
WBC: 7.1 10*3/uL (ref 4.0–10.5)

## 2014-10-17 MED ORDER — FUROSEMIDE 20 MG PO TABS: ORAL_TABLET | ORAL | Status: DC

## 2014-10-17 MED ORDER — POTASSIUM CHLORIDE ER 10 MEQ PO TBCR
10.0000 meq | EXTENDED_RELEASE_TABLET | Freq: Every day | ORAL | Status: DC
Start: 2014-10-17 — End: 2015-03-16

## 2014-10-17 MED ORDER — POTASSIUM CHLORIDE CRYS ER 20 MEQ PO TBCR
40.0000 meq | EXTENDED_RELEASE_TABLET | Freq: Once | ORAL | Status: AC
  Administered 2014-10-17: 40 meq via ORAL
  Filled 2014-10-17: qty 2

## 2014-10-17 NOTE — Clinical Social Work Note (Addendum)
Pt d/c today to [facility]. Pt and pt's daughter, [name] aware and agreeable. Kim called and stated she has arranged for pt to have 30 day supply of chemo pills filled. [place] notified of d/c. D/C summary to be faxed upon completion. Pt to transfer with RN. Blue Medicare authorization called to [facility].   Benay Pike, [place]

## 2014-10-17 NOTE — Progress Notes (Signed)
Gave report to nurse Otila Kluver at the [facility] regarding pt. Removed IV from pt's arm. Site is clean, dry, and intact. Reviewed discharge paperwork with pt and family. Pt and family verbalized understanding. Pt vitals are stable. Pt is alert and oriented.

## 2014-10-17 NOTE — Progress Notes (Signed)
Pt supplied med Tarceva 25 mg administered by daughter. Daughter said she gives her this med every night before bed. Notified Pharmacy about this to change time.

## 2014-10-17 NOTE — Discharge Summary (Addendum)
Physician Discharge Summary  PEGI MILAZZO WEX:[id number] DOB: 04/09/1941 DOA: 10/15/2014  PCP: [name], MD  Admit date: 10/15/2014 Discharge date: 10/17/2014  Time spent: 40 minutes  Recommendations for Outpatient Follow-up:  1. Follow up with oncology as scheduled to evaluate dyspnea as relates to progressive nodular disease and ongoing need for Tarceva  2. PCP 1 week for evaluation of volume status as lasix every other day added to meds. Recommend bmet to evaluate electrolye 3. Being discharged to [facility]   Discharge Diagnoses:  Principal Problem:   Diastolic CHF, acute Active Problems:   HTN (hypertension)   Adenocarcinoma of left lung, stage 1   Bilateral carotid artery disease   Hyperlipidemia   Bronchitis   Dyspnea   Acute on chronic renal failure   Discharge Condition: stable  Diet recommendation: heart healthy  Filed Weights   10/15/14 1950 10/16/14 0536 10/17/14 0600  Weight: 63.504 kg (140 lb) 68.266 kg (150 lb 8 oz) 68 kg (149 lb 14.6 oz)    History of present illness:  73 year old female who  has a past medical history of CVA (cerebral infarction); Stroke; Hypercholesteremia; Hypertension; Lung mass; Arthritis; TIA (transient ischemic attack); Pneumothorax; and Lung cancer. Was brought to the hospital on 10/15/14 with chief complaint of shortness of breath for prior 3 days. Patient lives by herself had been calling family of difficulty breathing with phlegm stuck in the throat. She denied chest pain, no nausea vomiting or diarrhea. No fever no dysuria urgency or frequency of urination. Patient has history of lung cancer, and is on chemotherapy Tarceva.In the ED patient was found to have BNP 594.5, d-dimer 0.55, normal age adjusted d-dimer. 2-D echocardiogram from 2012 showed EF 93-81%, grade 1 diastolic dysfunction. Patient was not hypoxic, O2 sats 98% on room air.  Hospital Course:  Worsening dyspnea Likely multifactorial i.e. Acute diastolic CHF in  setting of progressive nodular disease (adenocarcinoma of there lung s/p thoracoscopic lobectomy 2013). Admitted and provided with IV lasix with improvement. Echo with 01-75% EF and diastolic dysfunction.  CT chest  yields stable nodular disease. Continues with fine crackles on exam and does not appear volume overloaded at discharge. Volume status negative 1L at discharge. Will discharge with low dose lasix. Has appointment with oncology 10/27/14 for evaluation interstitial pneumonitis and need for ongoing chemo.    Acute diastolic CHF Patient had bibasilar crackles with mildly elevated BNP. 2 decho with moderate LVH. Moderate wall thickening indicative of hypertensive cardiomyopathy vshypertrophic cardiomyopathy. However, waveform and myocardial appearance favor diagnosis of hypertrophic cardiomyopathy.Valsalva maneuver could not be performed. Systolic function was vigorous. The estimated ejection fraction was in the range of 65%to 70%. Wall motion was normal; there were no regional wallmotion abnormalities. Findings consistent with left ventriculardiastolic dysfunction. Doppler parameters are consistent withboth elevated ventricular end-diastolic filling pressure andelevated left atrial filling pressure. Will discharge with low dose lasix. Recommend PCP follow up 1 week for evaluation of need for ongoing lasix  Acute on chronic renal failure stage II-III Likely related to IV lasix. Current creatinine 1.51 and baseline appears to be near 1.2. Urine output good. Being discharged with low dose lasix. Recommend BMET in 1 week for evaluation of kidney function  Adenocarcinoma of lung Last seen by onclogy 09/29/14. Note at that time indicates progressive nodular disease with minimal symptoms in setting of non-specific interstitial pneumonitis. Family reports gradual decline in respiratory status over last 6 months. On Tarceva 25 daily started 05/2014 due to worsening findings on CT scan done  04/2014 per  chart review. Discussed case with oncology who recommended chest CT which indicated stable nodules. Will follow up 12/28 with oncology as scheduled to follow CT and evaluate need for ongoing Tarceva  CVA Left cerebral with right hemiparesis. Remained stable at baseline.  Family reports recent fall (mechanical). PT evaluation recommending snf.   Hypertension Controlled. Continue atenolol, Norvasc.  Depression Stable at baseline Continue Celexa.    Procedures:  Echo the cavity size was below normal. Wall thickness was increased in a pattern of moderate LVH. Moderate wall thickening indicative of hypertensive cardiomyopathy vs hypertrophic cardiomyopathy. However, waveform and myocardial appearance favor diagnosis of hypertrophic cardiomyopathy. Valsalva maneuver could not be performed. Systolic function was vigorous. The estimated ejection fraction was in the range of 65% to 70%. Wall motion was normal; there were no regional wall motion abnormalities. Findings consistent with left ventricular diastolic dysfunction. Doppler parameters are consistent with both elevated ventricular end-diastolic filling pressure and elevated left atrial filling pressure. Medial E/e&' 35.4. - Aortic valve: Increased velocities across LVOT likely due to hyperdynamic function with late systolic anterior motion of the mitral leaflet, and less likely due to signficant aortic stenosis based on parasternal short axis views. Peak velocity (S): 282 cm/s. Mean gradient (S): 21 mm Hg.  Consultations:  none  Discharge Exam: Filed Vitals:   10/17/14 0620  BP: 117/55  Pulse: 59  Temp: 98.5 F (36.9 C)  Resp: 16    General: sitting in chair looking well Cardiovascular: RRR +murmur no LE edema Respiratory: normal effort BS with good air flow and fine crackles bilateral base no wheeze  Discharge Instructions You were cared for by a hospitalist during your  hospital stay. If you have any questions about your discharge medications or the care you received while you were in the hospital after you are discharged, you can call the unit and asked to speak with the hospitalist on call if the hospitalist that took care of you is not available. Once you are discharged, your primary care physician will handle any further medical issues. Please note that NO REFILLS for any discharge medications will be authorized once you are discharged, as it is imperative that you return to your primary care physician (or establish a relationship with a primary care physician if you do not have one) for your aftercare needs so that they can reassess your need for medications and monitor your lab values.   Current Discharge Medication List    START taking these medications   Details  furosemide (LASIX) 20 MG tablet 1 tab every other day. Qty: 30 tablet, Refills: o    potassium chloride (K-DUR) 10 MEQ tablet Take 1 tablet (10 mEq total) by mouth daily.      CONTINUE these medications which have NOT CHANGED   Details  amLODipine (NORVASC) 5 MG tablet Take 1 tablet (5 mg total) by mouth daily. Qty: 30 tablet, Refills: 5    aspirin EC 81 MG tablet Take 81 mg by mouth every morning.    atenolol (TENORMIN) 50 MG tablet Take 1 tablet (50 mg total) by mouth 1 day or 1 dose. Qty: 90 tablet, Refills: 1    citalopram (CELEXA) 20 MG tablet Take 20 mg by mouth daily.    erlotinib (TARCEVA) 25 MG tablet Take 1 tablet (25 mg total) by mouth daily. Take on an empty stomach 1 hour before meals or 2 hours after. Qty: 30 tablet, Refills: 6    simvastatin (ZOCOR) 20 MG tablet Take 1 tablet (20 mg total)  by mouth daily. Qty: 30 tablet, Refills: 5    diphenoxylate-atropine ([name]) 2.5-0.025 MG per tablet Take 1 tablet by mouth 4 (four) times daily as needed for diarrhea or loose stools. Qty: 30 tablet, Refills: 5       No Known Allergies Follow-up Information    Follow up with  LUKING,SCOTT, MD. Schedule an appointment as soon as possible for a visit in 1 week.   Specialty:  Family Medicine   Why:  for evaluation of dyspnea, volume status and monitoring of electrolytes   Contact information:   [place] [address] [phone]        The results of significant diagnostics from this hospitalization (including imaging, microbiology, ancillary and laboratory) are listed below for reference.    Significant Diagnostic Studies: Dg Chest 2 View  10/15/2014   CLINICAL DATA:  Shortness of breath for 3 days. History of lung cancer.  EXAM: CHEST  2 VIEW  COMPARISON:  05/26/2014 and 02/07/2014  FINDINGS: Chronic volume loss in the left hemithorax. Prominent lung markings appear chronic. Increased densities in the left lower chest could be related to volume loss. Difficult to exclude new findings in this region. No large pleural effusions. Limited evaluation of the cardiac silhouette.  IMPRESSION: Chronic lung changes with chronic volume loss in the left hemithorax.  Increased densities in the left lower chest are probably related to volume loss.   Electronically Signed   By: Markus Daft M.D.   On: 10/15/2014 21:30   Ct Chest Wo Contrast  10/16/2014   CLINICAL DATA:  Dyspnea, previous lung nodules  EXAM: CT CHEST WITHOUT CONTRAST  TECHNIQUE: Multidetector CT imaging of the chest was performed following the standard protocol without IV contrast.  COMPARISON:  7/27/ 15  FINDINGS: Sagittal images of the spine shows diffuse osteopenia and degenerative changes thoracic spine. Atherosclerotic calcifications of abdominal aorta and splenic artery. No adrenal gland mass is noted in visualized upper abdomen.  Atherosclerotic calcifications of coronary arteries. Small mediastinal lymph nodes are stable. No hilar adenopathy.  There are breathing motion artifact in the region of lung bases limiting the examination.  Central airways are patent.  Bilateral apical scarring  again noted.  Again noted mild reticular peripheral interstitial prominence consistent with fibrotic changes without significant change from prior exam. Minimal interstitial prominence bilateral lower lobes with subtle hazy parenchymal ground-glass attenuation bilateral lower lobes is stable. This may be due to chronic hypoventilatory changes or mild interstitial edema. No segmental infiltrates.  Axial image 19 nodule in right upper lobe posteriorly measures 7 mm decrease in size from prior exam when measures 10 mm.  Axial image 31 nodule in right upper lobe measures 7.3 mm stable in size in appearance from prior exam. Axial image 35 right lower lobe nodule measures 1.5 by 1.6 cm stable from prior exam. Atherosclerotic calcifications of mitral valve.  Osteopenia and mild degenerative changes thoracic spine.  : 1. Stable nodules in right lung. The largest nodule in right lower lobe measures 1.6 x 1.5 cm stable from prior exam. No definite new pulmonary nodules are noted. 2. Again noted atherosclerotic calcifications of thoracic aorta and coronary arteries. 3. Stable reticular peripheral interstitial prominence bilaterally consistent with fibrotic changes. Again noted mild ground-glass attenuation bilateral lower lobes suspicious for chronic hypoventilatory changes or mild interstitial edema. No segmental infiltrate. 4. Osteopenia and mild degenerative changes thoracic spine.   Electronically Signed   By: Lahoma Crocker M.D.   On: 10/16/2014 11:01  Microbiology: No results found for this or any previous visit (from the past 240 hour(s)).   Labs: Basic Metabolic Panel:  Recent Labs Lab 10/15/14 2050 10/16/14 0547 10/17/14 0546  NA 142 142 142  K 3.6* 3.9 3.6*  CL 104 104 102  CO2 26 26 28   GLUCOSE 97 173* 103*  BUN 12 11 15   CREATININE 1.39* 1.37* 1.51*  CALCIUM 9.2 9.4 9.1   Liver Function Tests:  Recent Labs Lab 10/16/14 0547  AST 21  ALT 10  ALKPHOS 80  BILITOT 0.6  PROT 7.1  ALBUMIN  3.4*   No results for input(s): LIPASE, AMYLASE in the last 168 hours. No results for input(s): AMMONIA in the last 168 hours. CBC:  Recent Labs Lab 10/15/14 2050 10/16/14 0547 10/17/14 0546  WBC 5.1 3.6* 7.1  NEUTROABS 3.2  --   --   HGB 12.7 12.8 12.5  HCT 37.8 37.4 36.9  MCV 96.7 96.1 96.3  PLT 167 161 169   Cardiac Enzymes:  Recent Labs Lab 10/15/14 2050  TROPONINI <0.30   BNP: BNP (last 3 results)  Recent Labs  10/15/14 2050  PROBNP 594.5*   CBG: No results for input(s): GLUCAP in the last 168 hours.     SignedRadene Gunning  [facility] 10/17/2014, 10:56 AM    Patient seen and independently examined.  Stable for discharge.

## 2014-10-17 NOTE — Progress Notes (Signed)
Phoned the [facility] for report. No answer. Will continue to try to reach the nurse at the [facility].

## 2014-10-17 NOTE — Clinical Documentation Improvement (Signed)
'  Acute on chronic renal failure' documented in current record.  Please specify the stage of chronic renal failure and document in your progress note and carry over to the discharge summary.  Current renal labs below.  Component BUN Creatinine  Latest Ref Rng 6 - 23 mg/dL 0.50 - 1.10 mg/dL  10/15/2014 12 1.39 (H)  10/16/2014 11 1.37 (H)  10/17/2014 15 1.51 (H)   Component GFR calc non Af Amer  Latest Ref Rng >90 mL/min  10/15/2014 37 (L)  10/16/2014 38 (L)  10/17/2014 33 (L)   CKD stages: Possible Clinical Conditions?   _______CKD Stage II - GFR 60-80 _______CKD Stage III - GFR 30-59 _______CKD Stage IV - GFR 15-29 _______Cannot Clinically determine   Thank you, Mateo Flow, RN [phone] Clinical Documentation Specialist

## 2014-10-17 NOTE — Clinical Social Work Placement (Signed)
Clinical Social Work Department CLINICAL SOCIAL WORK PLACEMENT NOTE 10/17/2014  Patient:  Stephanie [name], Stephanie [name]  Account Number:  [id number] Admit date:  10/15/2014  Clinical Social Worker:  Benay Pike, LCSW  Date/time:  10/16/2014 10:46 AM  Clinical Social Work is seeking post-discharge placement for this patient at the following level of care:   [place]   (*CSW will update this form in Epic as items are completed)   10/16/2014  Patient/family provided with [facility] Department of Clinical Social Work's list of facilities offering this level of care within the geographic area requested by the patient (or if unable, by the patient's family).  10/16/2014  Patient/family informed of their freedom to choose among providers that offer the needed level of care, that participate in Medicare, Medicaid or managed care program needed by the patient, have an available bed and are willing to accept the patient.  10/16/2014  Patient/family informed of [facility]' ownership interest in [facility], as well as of the fact that they are under no obligation to receive care at this facility.  PASARR submitted to EDS on 10/16/2014 PASARR number received on 10/16/2014  FL2 transmitted to all facilities in geographic area requested by pt/family on  10/16/2014 FL2 transmitted to all facilities within larger geographic area on   Patient informed that his/her managed care company has contracts with or will negotiate with  certain facilities, including the following:     Patient/family informed of bed offers received:  10/16/2014 Patient chooses bed at [facility] Physician recommends and patient chooses bed at  [facility]  Patient to be transferred to [facility] on  10/17/2014 Patient to be transferred to facility by RN Patient and family notified of transfer on 10/17/2014 Name of family member notified:  Stephanie [name]- daughter  The following physician request were  entered in Epic:   Additional Comments:  Benay Pike, [place]

## 2014-10-19 ENCOUNTER — Non-Acute Institutional Stay (SKILLED_NURSING_FACILITY): Payer: Medicare Other | Admitting: Internal Medicine

## 2014-10-19 DIAGNOSIS — I5033 Acute on chronic diastolic (congestive) heart failure: Secondary | ICD-10-CM

## 2014-10-19 DIAGNOSIS — I69959 Hemiplegia and hemiparesis following unspecified cerebrovascular disease affecting unspecified side: Secondary | ICD-10-CM

## 2014-10-19 DIAGNOSIS — I10 Essential (primary) hypertension: Secondary | ICD-10-CM

## 2014-10-19 DIAGNOSIS — G8190 Hemiplegia, unspecified affecting unspecified side: Secondary | ICD-10-CM

## 2014-10-21 NOTE — Progress Notes (Addendum)
Patient ID: Stephanie [name], female   DOB: 07-Sep-1941, 73 y.o.   MRN: [id number]               HISTORY & PHYSICAL  DATE:  10/19/2014     FACILITY: [facility]    LEVEL OF CARE:   SNF   CHIEF COMPLAINT:  Admission to SNF, post stay at [facility], 10/15/2014 through 10/17/2014.    HISTORY OF PRESENT ILLNESS:   This patient lives in an independent seniors apartment in [place]).    It sounds as thought she has a lot of support from family in that setting.  She walks with a walker.    She was brought in with the chief complaint of shortness of breath for the prior three days.  This included a feeling that phlegm was stuck in her throat.  She was found to have a BNP of 594, a D-dimer of 0.55.  Noted to have a previous 2D-echo in 2012 that showed grade I diastolic dysfunction with an EF of 55-56%.  She was not hypoxemic with O2 sats of 98% on room air.    A CT scan of the chest revealed stable nodular disease as compared with a study done in July.  Most of these nodules are in the right lung with the largest nodule in the right lung measuring 1.6 x 1.5 cm.  She also had stable reticular, peripheral and interstitial predominance, likely consistent with chronic fibrotic change with ground-glass appearance in the bilateral lower lobes, suggestive of mild interstitial edema.  She also has chronic volume loss in the left lower lung on plain chest x-rays.  The patient was discharged with low-dose Lasix.    The patient's major disability appears to be a prior left cerebral CVA.  There is an MRI of the brain on [facility] that shows non-hemorrhagic small infarcts throughout the posterior left reticular nucleus, posterior left corona radiata.  The CVA, according to her family, was two years ago.    PAST MEDICAL HISTORY/PROBLEM LIST:               Diastolic heart failure, acute.    Hypertension.    Adeno CA of the left lung.  Status post thoracoscopic left upper lobe lobectomy and  mediastinal lymph node dissection in January 2013 for well differentiated disease.  It was recommended that she start Tarceva.  This started on 06/04/2014.    Progressive nodule lung disease on the right.     Asymptomatic cholelithiasis.    Bilateral carotid artery disease.    Hyperlipidemia.     Acute-on-chronic renal failure, with a creatinine at 1.51 and a baseline at 1.2.    CURRENT MEDICATIONS:  Discharge medications include:       Lasix 20 mg every other day.    Potassium 10 daily.    Norvasc 5 q.d.     Enteric-coated aspirin 81 q.d.    Tenormin 50 mg, 1 dose daily.    Celexa 20 q.d.    Tarceva 25 daily.    Zocor 20 q.d.    [name] 1 tablet q.4 p.r.n. for diarrhea or loose stools.    SOCIAL HISTORY:       HOUSING:  The patient lives in [place], which I gather is an independent seniors living.   FUNCTIONAL STATUS:  She walks with a rollator walker.  Her family is in a fair amount.  She is apparently continent.    ADVANCED DIRECTIVES:  There are no  advanced directives on the chart.    REVIEW OF SYSTEMS:  Not completely possible from the patient, but in combination with her niece:   CHEST/RESPIRATORY:  She is not complaining of shortness of breath.   Apparently gets very frustrated at her cortically-based language problems and then becomes short of breath.   She is no longer coughing.   CARDIAC:   No chest pain.    GI:  No abdominal pain.   GU:  No urinary incontinence.    PHYSICAL EXAMINATION:   GENERAL APPEARANCE:  The patient is not in any distress.   CHEST/RESPIRATORY:  Decreased air entry on the left.  The right lung sounds clearer.  A few crackles at the left lobe, as well.  No wheezing.  No clubbing.   CARDIOVASCULAR:  CARDIAC:   Heart sounds are normal.  She appears to be euvolemic.  JVP is not elevated.    GASTROINTESTINAL:  LIVER/SPLEEN/KIDNEYS:  No liver, no spleen.  No tenderness.   ABDOMEN:  No masses.   GENITOURINARY:  BLADDER:   Not  enlarged.   CIRCULATION:   EDEMA/VARICOSITIES:  Extremities:  Mild venous stasis.  Minimal edema.   NEUROLOGICAL:    CRANIAL NERVES:  I could not detect any cranial nerve abnormalities.  Her visual fields seem intact.   SENSATION/STRENGTH:   Marked right upper extremity greater than right lower extremity weakness, compatible with middle cerebral artery CVA.   OTHER:  She has a cortically-based language problem, left/right confusion, some mild confrontational naming difficulties.     ASSESSMENT/PLAN:               Late-effect dominant hemisphere CVA.  This appears to be doing quite well.  I am watching her walk with therapy.  She seems to do quite well and stable.    Diastolic heart failure.  Discharged on 20 mg every other day of Lasix.  Whatever symptomatology she was having seems to have resolved.    History of adeno CA of the lung that was localized.  Status post surgery in 2013.  She has nodular disease in the right lung.  The exact etiology of this is not completely clear.  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    Hypertension.  Appears to be controlled.    Diarrhea secondary to Tarceva.  She has medication for this.    I suspect this lady's stay here will be short.  I will recheck her BUN and creatinine, which was at 15 and 1.51 at discharge.  As noted, her troponin-I was negative, pro-BNP at 594.5.

## 2014-10-22 ENCOUNTER — Other Ambulatory Visit (HOSPITAL_COMMUNITY): Payer: Self-pay

## 2014-10-22 DIAGNOSIS — C3492 Malignant neoplasm of unspecified part of left bronchus or lung: Secondary | ICD-10-CM

## 2014-10-26 NOTE — Progress Notes (Signed)
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 Tarceva 25 mg daily  Initial surgical resection 11/18/2011 T2a, pN0  Adenocarcinoma LUL  Status post left cerebral CVA with right hemiparesis,    CURRENT THERAPY: Tarceva 25 mg daily  INTERVAL HISTORY: Stephanie [name] 73 y.o. female returns for follow-up of her lung cancer.  She was just admitted to [facility] for SOB. CT of the chest was stable.  Family feels her breathing was secondary to a panic attack.  She take 25 mg of Tarceva a day. Family reports panic attacks since 2012 prior to her lung cancer diagnosis. She never had a biopsy confirmation of recurrance of her lung cancer.   MEDICAL HISTORY: Past Medical History  Diagnosis Date  . CVA (cerebral infarction)     speech difficulities  . Stroke   . Hypercholesteremia     Takes Zocor daily  . Hypertension     takes Atenolol and Lisinopril daily  . Lung mass     left upper lobe  . Arthritis     hands  . TIA (transient ischemic attack)     Sat before thanksgiving;impaired speech  . Pneumothorax   . Lung cancer     has CVA (cerebral infarction); HTN (hypertension); Pneumothorax of left lung after biopsy; Adenocarcinoma of left lung, stage 1; Dysarthria as late effect of stroke; Bilateral carotid artery disease; Other and unspecified hyperlipidemia; Lack of coordination; Muscle weakness (generalized); Hyperlipidemia; Bronchitis; Diastolic CHF, acute; Dyspnea; and Acute on chronic renal failure on her problem list.      Adenocarcinoma of left lung, stage 1   10/19/2011 Imaging PET/CT showing activity in LUL lesion, no mediastinal metastasis. no evidence of distant mestastasis   10/19/2011 Initial Diagnosis Adenocarcinoma with Exon 19 deletion, ALK-   11/08/2011 Surgery Thoracoscopic left upper lobectomy with mediastinal exploration, T2aN0 adenocarcinoma  with bronchoalveolar  characteristics,  4.3cm, exon 19 deletion, ALK negative, Dr. Roxan Hockey   05/26/2014 Imaging CT scan showed worsening.   06/25/2014 -  Chemotherapy Tarceva 25 mg daily started   10/16/2014 Imaging CT Chest w/o contrast with stable nodules in right lung, larges is 1.6 x 1.5 cm. no new nodules.      has No Known Allergies.  Ms. Meisinger does not currently have medications on file.  SURGICAL HISTORY: Past Surgical History  Procedure Laterality Date  . No past surgeries    . Lung biopsy  10/13/11    left  . Abdominal hysterectomy  1975  . Varicose vein surgery  59yr ago  . Video assisted thoracoscopy  11/18/2011    Procedure: VIDEO ASSISTED THORACOSCOPY;  Surgeon: SMelrose Nakayama MD;  [facility] [facility]  Service: Thoracic;  Laterality: Left;  . Lobectomy  11/18/2011    Procedure: LOBECTOMY;  Surgeon: SMelrose Nakayama MD;  Location[place] [facility]  Service: Thoracic;  Laterality: Left;  LEFT UPPER LOBECTOMY  . Breast biopsy  early 2000    SOCIAL HISTORY: History   Social History  . Marital Status: Widowed    Spouse Name: N/A    Number of Children: N/A  . Years of Education: N/A   Occupational History  . Not on file.   Social History Main Topics  . Smoking status: Never Smoker   . Smokeless tobacco: Never Used  . Alcohol Use: No  . Drug Use: No  . Sexual Activity: Not Currently  Birth Control/ Protection: Post-menopausal, Surgical   Other Topics Concern  . Not on file   Social History Narrative    FAMILY HISTORY: Family History  Problem Relation Age of Onset  . Stroke Mother   . Stroke Sister   . Anesthesia problems Neg Hx   . Hypotension Neg Hx   . Malignant hyperthermia Neg Hx   . Pseudochol deficiency Neg Hx   . Diabetes Brother   . Cancer Brother   . Stroke Brother     Review of Systems  Constitutional: Positive for malaise/fatigue. Negative for fever, chills, weight loss and diaphoresis.  Eyes: Negative.   Respiratory: Positive  for cough, shortness of breath and wheezing. Negative for hemoptysis and sputum production.   Cardiovascular: Negative.   Gastrointestinal: Positive for constipation. Negative for heartburn, nausea, vomiting, abdominal pain, diarrhea, blood in stool and melena.  Genitourinary: Negative.   Musculoskeletal: Negative for myalgias, back pain, joint pain, falls and neck pain.       RUE weakness from CVA  Skin: Negative.   Neurological: Positive for speech change and focal weakness. Negative for dizziness, tingling, tremors, seizures, loss of consciousness and weakness.  Endo/Heme/Allergies: Negative.   Psychiatric/Behavioral: Negative for depression, suicidal ideas, hallucinations, memory loss and substance abuse. The patient is nervous/anxious. The patient does not have insomnia.     PHYSICAL EXAMINATION  ECOG PERFORMANCE STATUS: 1 - Symptomatic but completely ambulatory  Uses a walker at home  Filed Vitals:   10/27/14 0900  BP: 114/61  Pulse: 58  Temp: 98.1 F (36.7 C)  Resp: 16    Physical Exam  Constitutional: She is oriented to person, place, and time and well-developed, well-nourished, and in no distress.  Mild R arm hemiparesis. Struggles with speech. Gets onto the exam table with assistance  HENT:  Head: Normocephalic and atraumatic.  Mouth/Throat: Oropharynx is clear and moist. No oropharyngeal exudate.  Eyes: Conjunctivae and EOM are normal. Pupils are equal, round, and reactive to light. No scleral icterus.  Neck: Normal range of motion. Neck supple. No tracheal deviation present. No thyromegaly present.  Cardiovascular: Normal rate and regular rhythm.   Murmur heard. Pulmonary/Chest: Effort normal.  Crackles at base L>R, occasional expiratory "peep" LLL  Abdominal: Soft. Bowel sounds are normal. She exhibits no distension and no mass. There is no tenderness. There is no rebound and no guarding.  Musculoskeletal: Normal range of motion.  Can raise R arm, has decent hand  grip  Lymphadenopathy:    She has no cervical adenopathy.  Neurological: She is alert and oriented to person, place, and time. No cranial nerve deficit.  Obvious speech impaiment  Skin: Skin is warm and dry.  Psychiatric:  Mood appropriate but flat    LABORATORY DATA:  CBC    Component Value Date/Time   WBC 7.9 10/27/2014 0904   RBC 4.25 10/27/2014 0904   HGB 13.9 10/27/2014 0904   HCT 41.1 10/27/2014 0904   PLT 192 10/27/2014 0904   MCV 96.7 10/27/2014 0904   MCH 32.7 10/27/2014 0904   MCHC 33.8 10/27/2014 0904   RDW 12.3 10/27/2014 0904   LYMPHSABS 0.7 10/27/2014 0904   MONOABS 0.7 10/27/2014 0904   EOSABS 0.2 10/27/2014 0904   BASOSABS 0.0 10/27/2014 0904   CMP     Component Value Date/Time   NA 139 10/27/2014 0904   K 4.1 10/27/2014 0904   CL 104 10/27/2014 0904   CO2 30 10/27/2014 0904   GLUCOSE 117* 10/27/2014 0904   BUN 20  10/27/2014 0904   CREATININE 1.35* 10/27/2014 0904   CALCIUM 9.4 10/27/2014 0904   PROT 7.0 10/27/2014 0904   ALBUMIN 3.8 10/27/2014 0904   AST 28 10/27/2014 0904   ALT 17 10/27/2014 0904   ALKPHOS 77 10/27/2014 0904   BILITOT 0.9 10/27/2014 0904   GFRNONAA 38* 10/27/2014 0904   GFRAA 44* 10/27/2014 0904      ASSESSMENT and THERAPY PLAN:    Adenocarcinoma of left lung, stage 30 73 year old female with history of Stage 1B well differentiated adenocarcinoma of the lung s/p surgical resection in 11/2011.  She has had multiple imaging studies of the chest since diagnosis and surgery with the most recent CT chest on 12/17 showing stability of multiple abnormalities.. There is a question as to whether the abnormalities on CT are inflammatory vs. Recurrent disease. She has not undergone biopsy.  She is on Tarceva 25 mg daily. I discussed with the patient and her brother that I would like to pursue additional evaluation of her CT findings either with biopsy in IR or referral to pulmonology for additional input. I am not sure of the benefit of the  Tarceva for her not only because of the dose but also because we do not have biopsy proven recurrence of her malignancy.  If her cancer was back she should respond well to Tarceva given her EGFR status, but I am concerned about the dosing of the drug.  The patient is currently not willing to change her current management. Her brother has a good understanding of our conversation today and will have Ms. Streets's daughter come to her next appointment. I advised them to call in the interim with any problems or concerns prior to f/u.     All questions were answered. The patient knows to call the clinic with any problems, questions or concerns. We can certainly see the patient much sooner if necessary.   Molli Hazard 10/27/2014

## 2014-10-27 ENCOUNTER — Encounter (HOSPITAL_COMMUNITY): Payer: Medicare Other | Attending: Hematology and Oncology | Admitting: Hematology & Oncology

## 2014-10-27 ENCOUNTER — Encounter (HOSPITAL_COMMUNITY): Payer: Self-pay | Admitting: Hematology & Oncology

## 2014-10-27 ENCOUNTER — Encounter (HOSPITAL_COMMUNITY): Payer: Medicare Other

## 2014-10-27 VITALS — BP 114/61 | HR 58 | Temp 98.1°F | Resp 16 | Wt 145.4 lb

## 2014-10-27 DIAGNOSIS — C3492 Malignant neoplasm of unspecified part of left bronchus or lung: Secondary | ICD-10-CM

## 2014-10-27 DIAGNOSIS — R5383 Other fatigue: Secondary | ICD-10-CM

## 2014-10-27 DIAGNOSIS — C801 Malignant (primary) neoplasm, unspecified: Secondary | ICD-10-CM | POA: Insufficient documentation

## 2014-10-27 DIAGNOSIS — R0602 Shortness of breath: Secondary | ICD-10-CM

## 2014-10-27 DIAGNOSIS — C3412 Malignant neoplasm of upper lobe, left bronchus or lung: Secondary | ICD-10-CM

## 2014-10-27 DIAGNOSIS — K5900 Constipation, unspecified: Secondary | ICD-10-CM

## 2014-10-27 LAB — CBC WITH DIFFERENTIAL/PLATELET
BASOS ABS: 0 10*3/uL (ref 0.0–0.1)
Basophils Relative: 0 % (ref 0–1)
Eosinophils Absolute: 0.2 10*3/uL (ref 0.0–0.7)
Eosinophils Relative: 2 % (ref 0–5)
HEMATOCRIT: 41.1 % (ref 36.0–46.0)
HEMOGLOBIN: 13.9 g/dL (ref 12.0–15.0)
LYMPHS PCT: 9 % — AB (ref 12–46)
Lymphs Abs: 0.7 10*3/uL (ref 0.7–4.0)
MCH: 32.7 pg (ref 26.0–34.0)
MCHC: 33.8 g/dL (ref 30.0–36.0)
MCV: 96.7 fL (ref 78.0–100.0)
MONOS PCT: 9 % (ref 3–12)
Monocytes Absolute: 0.7 10*3/uL (ref 0.1–1.0)
Neutro Abs: 6.4 10*3/uL (ref 1.7–7.7)
Neutrophils Relative %: 80 % — ABNORMAL HIGH (ref 43–77)
Platelets: 192 10*3/uL (ref 150–400)
RBC: 4.25 MIL/uL (ref 3.87–5.11)
RDW: 12.3 % (ref 11.5–15.5)
WBC: 7.9 10*3/uL (ref 4.0–10.5)

## 2014-10-27 LAB — COMPREHENSIVE METABOLIC PANEL
ALBUMIN: 3.8 g/dL (ref 3.5–5.2)
ALK PHOS: 77 U/L (ref 39–117)
ALT: 17 U/L (ref 0–35)
AST: 28 U/L (ref 0–37)
Anion gap: 5 (ref 5–15)
BUN: 20 mg/dL (ref 6–23)
CO2: 30 mmol/L (ref 19–32)
Calcium: 9.4 mg/dL (ref 8.4–10.5)
Chloride: 104 mEq/L (ref 96–112)
Creatinine, Ser: 1.35 mg/dL — ABNORMAL HIGH (ref 0.50–1.10)
GFR calc Af Amer: 44 mL/min — ABNORMAL LOW (ref >90)
GFR calc non Af Amer: 38 mL/min — ABNORMAL LOW (ref >90)
GLUCOSE: 117 mg/dL — AB (ref 70–99)
POTASSIUM: 4.1 mmol/L (ref 3.5–5.1)
Sodium: 139 mmol/L (ref 135–145)
Total Bilirubin: 0.9 mg/dL (ref 0.3–1.2)
Total Protein: 7 g/dL (ref 6.0–8.3)

## 2014-10-27 LAB — LACTATE DEHYDROGENASE: LDH: 222 U/L (ref 94–250)

## 2014-10-27 NOTE — Assessment & Plan Note (Signed)
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  She has had multiple imaging studies of the chest since diagnosis and surgery with the most recent CT chest on 12/17 showing stability of multiple abnormalities.. There is a question as to whether the abnormalities on CT are inflammatory vs. Recurrent disease. She has not undergone biopsy.  She is on Tarceva 25 mg daily. I discussed with the patient and her brother that I would like to pursue additional evaluation of her CT findings either with biopsy in IR or referral to pulmonology for additional input. I am not sure of the benefit of the Tarceva for her not only because of the dose but also because we do not have biopsy proven recurrence of her malignancy.  If her cancer was back she should respond well to Tarceva given her EGFR status, but I am concerned about the dosing of the drug.  The patient is currently not willing to change her current management. Her brother has a good understanding of our conversation today and will have Stephanie [name]'s daughter come to her next appointment. I advised them to call in the interim with any problems or concerns prior to f/u.

## 2014-10-27 NOTE — Progress Notes (Signed)
Blood drawn during exam.

## 2014-10-27 NOTE — Progress Notes (Signed)
Rennie Plowman presented for labwork. Labs per MD order drawn via Peripheral Line 23 gauge needle inserted in let antecubital.  Good blood return present. Procedure without incident.  Needle removed intact. Patient tolerated procedure well.

## 2014-10-27 NOTE — Patient Instructions (Signed)
[facility] Discharge Instructions  RECOMMENDATIONS MADE BY THE CONSULTANT AND ANY TEST RESULTS WILL BE SENT TO YOUR REFERRING PHYSICIAN.  Please bring your daughter to your next visit. I will see you back in 4 weeks. We discussed the Tarceva dose and biopsy of the lung to get a better idea of what is going on.  We also discussed referral to a lung specialist. Continue your Tarceva for now.  Thank you for choosing [facility] to provide your oncology and hematology care.  To afford each patient quality time with our providers, please arrive at least 15 minutes before your scheduled appointment time.  With your help, our goal is to use those 15 minutes to complete the necessary work-up to ensure our physicians have the information they need to help with your evaluation and healthcare recommendations.    Effective January 1st, 2014, we ask that you re-schedule your appointment with our physicians should you arrive 10 or more minutes late for your appointment.  We strive to give you quality time with our providers, and arriving late affects you and other patients whose appointments are after yours.    Again, thank you for choosing [facility].  Our hope is that these requests will decrease the amount of time that you wait before being seen by our physicians.       _____________________________________________________________  Should you have questions after your visit to [facility], please contact our office at [phone] between the hours of 8:30 a.m. and 5:00 p.m.  Voicemails left after 4:30 p.m. will not be returned until the following business day.  For prescription refill requests, have your pharmacy contact our office with your prescription refill request.

## 2014-11-05 ENCOUNTER — Non-Acute Institutional Stay (SKILLED_NURSING_FACILITY): Payer: Medicare Other | Admitting: Internal Medicine

## 2014-11-05 ENCOUNTER — Encounter: Payer: Self-pay | Admitting: Internal Medicine

## 2014-11-05 DIAGNOSIS — I5031 Acute diastolic (congestive) heart failure: Secondary | ICD-10-CM

## 2014-11-05 DIAGNOSIS — I10 Essential (primary) hypertension: Secondary | ICD-10-CM

## 2014-11-05 DIAGNOSIS — N179 Acute kidney failure, unspecified: Secondary | ICD-10-CM

## 2014-11-05 DIAGNOSIS — N189 Chronic kidney disease, unspecified: Secondary | ICD-10-CM

## 2014-11-05 DIAGNOSIS — I69322 Dysarthria following cerebral infarction: Secondary | ICD-10-CM

## 2014-11-05 NOTE — Progress Notes (Signed)
Patient ID: Stephanie [name], female   DOB: [birth year], 74 y.o.   MRN: [id number]   This is an acute visit.  Level care skilled.  Facility [facility].  Chief complaint acute visit secondary to question hypotension--follow-up CVA-follow-up renal insufficiency.  History of present illness.  Patient is a pleasant 74 year old female with a history of CVA with some right-sided weakness as well as pulmonary nodules in grade 1 diastolic dysfunction.  She is on Lasix every other day.  She also has a listed history of hypertension she is on atenolol 50 mg a day as well as Norvasc 5 mg a day along with Lasix 20 mg every other day.  Nursing staff held her atenolol and Norvasc today secondary to a systolic in the [id number] was in the 60s.  Patient is not complaining of any increased weakness or dizziness she does have right-sided weakness which appears to be baseline as well as continued language problem dysarthria secondary to stroke.  I did take orthostatic blood pressures and got 130/74 sitting-110/70 standing.  She did not complain of any dizziness or syncopal-type feelings during the orthostatic readings.  Family medical social history is been reviewed per admission note on 10/21/2014.  Medications have been reviewed per MAR.  Review of systems-somewhat limited secondary to patient's language difficulties-her brother was in the room and helped with this.  In general does not complain of any fever or chills.  Respiratory no complaints of shortness breath or cough.  Cardiac no chest pain or significant lower extremity edema.  GI does not complain of abdominal discomfort nausea or vomiting.  Musculoskeletal continues with some right-sided weakness this appears to be more on her right upper extremity versus lower extremities but she is able to stand with some assistance does not complain of pain here.  Neurologic does not complain of any dizziness or headache.  Physical  exam.  Temperature is 97.8 pulse on exam was 80 respirations 18 blood pressure taken sitting 130/74-standing 110/70.  In general this is a pleasant elderly female in no distress.  Her skin is warm and dry.  Oropharynx clear mucous membranes moist.  Chest is clear to auscultation with somewhat poor respiratory effort slightly decreased entry on the left versus the right.  There is no labored breathing.  Heart is regular rate and rhythm without murmur gallop or rub she does not appear to have much lower extremity edema mild venous stasis which is baseline.  Abdomen is soft nontender positive bowel sounds.  Musculoskeletal can stand with assistance moves all extremities 4 but has significant right-sided weakness compared to the left of the upper extremities and also some residual right-sided weakness on the lower extremity.  Psych she has again a cortically -based language followed as noted previously    Labs.  10/22/2014.  Sodium 139 potassium 4.4 BUN 18 creatinine 1.35.  10/17/2014.  WBC self 0.1 hemoglobin 12.5 platelets 169.  Assessment and plan.  #1-question hypotension-I did review her blood pressures these appear to be variable I see occasional systolic in the 83M I also see 1 11--113 and 148-again I got 1:30 sitting today and 110 standing.  Will discontinue the Norvasc and reduce her atenolol to 25 mg a day again does not appear she has elevated systolic very often--.  Also will write an order to hold atenolol for pulse less than 60 although per chart review it does not appear this happens very often I do see a couple readings in the 50s again it was 80 on  exam today.  Continue monitor pulse and blood pressure every shift.  #2-diastolic CHF this appears to be stable most recent weight is 146.5 this appears to be baseline clinically appears to be compensated currently.  Will check an updated metabolic panel since she is on Lasix every other day and has some element  of renal insufficiency although this appears to have relatively normalized near  her baseline per recent lab.  #4-history CVA-this appears to be relatively baseline with right-sided weakness-she continues with therapy-she is on aspirin for anticoagulation also continues on a statin  [id number]

## 2014-11-07 ENCOUNTER — Non-Acute Institutional Stay (SKILLED_NURSING_FACILITY): Payer: Medicare Other | Admitting: Internal Medicine

## 2014-11-07 DIAGNOSIS — R05 Cough: Secondary | ICD-10-CM

## 2014-11-07 DIAGNOSIS — N289 Disorder of kidney and ureter, unspecified: Secondary | ICD-10-CM

## 2014-11-07 DIAGNOSIS — R059 Cough, unspecified: Secondary | ICD-10-CM

## 2014-11-07 DIAGNOSIS — D649 Anemia, unspecified: Secondary | ICD-10-CM

## 2014-11-07 NOTE — Progress Notes (Signed)
Patient ID: Stephanie [name], female   DOB: 20-Feb-1941, 74 y.o.   MRN: [id number]       This is an acute visit.  Level care skilled.  Facility [facility].  Chief complaint acute visit secondary to cough.  History of present illness.  Patient is a pleasant 74 year old female with a history of CVA with some right-sided weakness as well as pulmonary nodules in grade 1 diastolic dysfunction.  She is on Lasix every other day.  She also has a listed history of hypertension she was on atenolol 50 mg a day as well as Norvasc 5 mg a day along with Lasix 20 mg every other day.  Nursing staff held her atenolol and Norvasc  secondary to a systolic in the [id number] was in the 60s  2 days ago I did discontinue her Norvasc and reduced her atenolol to 25 mg a day-most recent blood pressures 117/59-at this point appears to be stable but will have to be monitored.  Most acute issue today is a cough patient is complaining of-apparently this is productive of somewhat clear phlegm-get her vital signs are stable O2 saturation is 94% on room air.  She does not specifically complain of shortness of breath but says cough makes her uncomfortable.  .     Family medical social history is been reviewed per admission note on 10/21/2014.  Medications have been reviewed per MAR.  Review of systems-somewhat limited secondary to patient's language difficulties-  In general does not complain of any fever or chills.  Respiratory--complains of a cough as noted above-does not really complain of shortness of breath  Cardiac no chest pain or significant lower extremity edema.  GI does not complain of abdominal discomfort nausea or vomiting.  Musculoskeletal continues with some right-sided weakness this appears to be more on her right upper extremity versus lower extremities but she is able to stand with some assistance does not complain of pain here.  Neurologic does not complain of any dizziness or  headache.  Physical exam.   Temperature is 98.1 pulse 57 respirations 22 blood pressure 117/59 O2 saturation 94% on room air  In general this is a pleasant elderly female in no distress.  Her skin is warm and dry.  Oropharynx clear mucous membranes moist.  Chest --some minimal congestion noted at the bases there is no labored breathing  .  Heart is regular rate and rhythm without murmur gallop or rub she does not appear to have much lower extremity edema mild venous stasis which is baseline.  Abdomen is soft nontender positive bowel sounds.  Musculoskeletal can stand with assistance moves all extremities 4 but has significant right-sided weakness compared to the left of the upper extremities and also some residual right-sided weakness on the lower extremity-- I do not note any deformities.  Psych she has again a cortically -based language followed as noted previously    Labs.  11/06/2014.  WBC 5.4 hemoglobin 11.8 platelets 153.  Sodium 139 potassium 4.2 BUN 18 creatinine 1.3.    10/22/2014.  Sodium 139 potassium 4.4 BUN 18 creatinine 1.35.  10/17/2014  1 hemoglobin 12.5 platelets 169.  Assessment and plan.  #1 cough-we'll start Mucinex 6 mg twice a day for 5 days-also will check a chest x-ray and monitor vital signs pulse ox every shift for 72 hours-clinically she appears stable but says she is uncomfortable with the cough.   #2-renal insufficiency-this appears to be baseline with most recent creatinine 1.3 the when 18 again she is  on Lasix every other day.  #3 diastolic CHF-this appears to be relatively stable again we'll see what the chest x-ray tells [place].  #4-it appears her hemoglobin has dropped slightly with hemoglobin 11.8 appears has run in the 12-- 13 range previously-will guaiac stools 3 again this could be a lab variation  Will update a CBC next week.  [id number]   .  Marland Kitchen  #4-history CVA-this appears to be relatively baseline with right-sided  weakness-she continues with therapy-she is on aspirin for anticoagulation also continues on a statin  [id number]

## 2014-11-08 ENCOUNTER — Inpatient Hospital Stay (HOSPITAL_COMMUNITY): Payer: Medicare Other | Attending: Internal Medicine

## 2014-11-10 ENCOUNTER — Encounter: Payer: Self-pay | Admitting: Internal Medicine

## 2014-11-10 DIAGNOSIS — R05 Cough: Secondary | ICD-10-CM | POA: Insufficient documentation

## 2014-11-10 DIAGNOSIS — D649 Anemia, unspecified: Secondary | ICD-10-CM | POA: Insufficient documentation

## 2014-11-10 DIAGNOSIS — R059 Cough, unspecified: Secondary | ICD-10-CM | POA: Insufficient documentation

## 2014-11-17 ENCOUNTER — Non-Acute Institutional Stay (SKILLED_NURSING_FACILITY): Payer: Medicare Other | Admitting: Internal Medicine

## 2014-11-17 DIAGNOSIS — G8190 Hemiplegia, unspecified affecting unspecified side: Secondary | ICD-10-CM

## 2014-11-17 DIAGNOSIS — I69959 Hemiplegia and hemiparesis following unspecified cerebrovascular disease affecting unspecified side: Secondary | ICD-10-CM

## 2014-11-17 DIAGNOSIS — I5031 Acute diastolic (congestive) heart failure: Secondary | ICD-10-CM

## 2014-11-19 NOTE — Progress Notes (Signed)
Patient ID: Stephanie [name], female   DOB: 1941/04/27, 74 y.o.   MRN: [id number]               PROGRESS NOTE  DATE:  11/17/2014                    FACILITY: [facility]                       LEVEL OF CARE:   SNF   Acute Visit   CHIEF COMPLAINT:  Review of medical issues.    HISTORY OF PRESENT ILLNESS:  This is a patient who came to [place] on 10/19/2014.  She had been on [facility] with shortness of breath.    She has a history of metastatic adeno-CA of the lung.    Her major disability is a late-effect dominant hemisphere CVA, which I think occurred some two years before this.    She also has diastolic heart failure.    I did not expect this patient to be here that long.  However, she has been here for more than a month now.    PHYSICAL EXAMINATION:   CHEST/RESPIRATORY:  Shallow, but otherwise clear air entry bilaterally.   CARDIOVASCULAR:  CARDIAC:   Heart sounds are normal.  She appears to be euvolemic.   NEUROLOGICAL:    CRANIAL NERVES:  She has a cortically-based language problem, but can converse.   SENSATION/STRENGTH:  She has right upper extremity greater than right lower extremity weakness, although this seems to have improved.    ASSESSMENT/PLAN:                              Late-effect dominant hemisphere CVA.  This was, I think, quite a few years ago.    Diastolic heart failure.  I see no evidence of that.    History of adeno-CA of the lung.  She remains on Tarceva.  I am not exactly sure who is following this.  Anemia.  She has an OB-positive stool, but I cannot see any CBCs on her chart.

## 2014-11-28 ENCOUNTER — Ambulatory Visit (HOSPITAL_COMMUNITY): Payer: Medicare Other | Admitting: Hematology & Oncology

## 2014-11-28 ENCOUNTER — Other Ambulatory Visit (HOSPITAL_COMMUNITY): Payer: Medicare Other

## 2014-12-01 ENCOUNTER — Ambulatory Visit (HOSPITAL_COMMUNITY): Payer: Self-pay | Admitting: Oncology

## 2014-12-01 ENCOUNTER — Ambulatory Visit (HOSPITAL_COMMUNITY): Payer: Self-pay | Admitting: Hematology & Oncology

## 2014-12-01 ENCOUNTER — Other Ambulatory Visit (HOSPITAL_COMMUNITY): Payer: Self-pay

## 2014-12-01 DIAGNOSIS — C3492 Malignant neoplasm of unspecified part of left bronchus or lung: Secondary | ICD-10-CM

## 2014-12-01 NOTE — Progress Notes (Signed)
Stephanie Lange, MD [place] [id number]  Adenocarcinoma of left lung, stage 1 - Plan: omeprazole (PRILOSEC) 20 MG capsule, CT Chest Wo Contrast  CURRENT THERAPY: Tarceva 25 mg daily  INTERVAL HISTORY: Stephanie [name] 74 y.o. 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 Roxan Hockey on 11/18/2011.    Adenocarcinoma of left lung, stage 1   10/19/2011 Imaging PET/CT showing activity in LUL lesion, no mediastinal metastasis. no evidence of distant mestastasis   10/19/2011 Initial Diagnosis Adenocarcinoma with Exon 19 deletion, ALK-   11/08/2011 Surgery Thoracoscopic left upper lobectomy with mediastinal exploration, T2aN0 adenocarcinoma with bronchoalveolar  characteristics,  4.3cm, exon 19 deletion, ALK negative, Dr. Roxan Hockey   07/31/2013 Imaging CT chest- Stable postoperative and post therapy change in the left hemi- thorax. No evidence of lung cancer recurrence.   02/12/2014 Imaging Ct chest- The previously seen pulmonary nodules are stable. However, there appears to be new ground-glass pulmonary nodules in the right upper lobe. These are small and nonspecific. Cannot exclude metastases.   05/26/2014 Imaging CT chest- Right lung nodules are grossly stable from 02/12/2014 but appear new/enlarged from 07/16/2012, raising concern for bronchogenic carcinoma. Pulmonary parenchymal pattern of fibrosis does not appear progressive from 07/16/2012   06/25/2014 - 11/30/2014 Chemotherapy Tarceva 25 mg daily started by Dr. Barnet Glasgow ([facility])   10/16/2014 Imaging CT chest- Stable nodules in right lung. The largest nodule in right lower lobe measures 1.6 x 1.5 cm stable from prior exam. No definite new pulmonary nodules are noted.   12/02/2014 Treatment Plan Change Inappropriate use of Tarceva without biopsy proven disease recurrence and inappropriately low dose.   I  had a long discussion with the patient and her daughters.  I reviewd the patient's chart is great detail.  I personally reviewed and went over laboratory results with the patient.  The results are noted within this dictation. I personally reviewed and went over radiographic studies with the patient.  The results are noted within this dictation.  I personally reviewed and went over pathology results with the patient.  On close review, the patient has been presumed to have EGFR + recurrent NSCLC without biopsy proven disease.  CT imaging demonstrates a mixed appearance without clear progressive disease.  Dosing of Tarceva ia typically 100-150 mg daily, not 25 mg daily.  The role of the 25 mg tablet is to be given with the 100 mg tablet if dose reduction from 150 mg is necessary.  With this, I have made the following recommendations/options (order of aggressiveness): 1. Biopsy lesion to evaluate for recurrent disease and evaluate for EGFR mutation.  Presently, we do not know if she has a mutated disease or not, thus the efficacy and utility of Tarceva is questionable, in addition to its dose.  The patient is not interested in a biopsy at this time given her past history with previous biopsy of lung mass in 2013. 2. Continue Tarceva, and consider increased dose to 100 mg daily (at least).  Continue to follow scans every 3 months.  Unfortunately, given her admission to SNF, the family is having a difficult time getting the medication due to regulations.  3. Stop Tarceva and repeat scans in March 2016.  The dose of Tarceva is likely ineffective.  She does not have biopsy proven disease, with poor correlation with imaging studies.    She has opted  for choice #3 which is reasonable.  She plans to be discharged from SNF on May 1.  Oncologically, she denies any complaints and ROS questioning is negative.  Past Medical History  Diagnosis Date  . CVA (cerebral infarction)     speech difficulities  . Stroke     . Hypercholesteremia     Takes Zocor daily  . Hypertension     takes Atenolol and Lisinopril daily  . Lung mass     left upper lobe  . Arthritis     hands  . TIA (transient ischemic attack)     Sat before thanksgiving;impaired speech  . Pneumothorax   . Lung cancer     has CVA (cerebral infarction); HTN (hypertension); Pneumothorax of left lung after biopsy; Adenocarcinoma of left lung, stage 1; Dysarthria as late effect of stroke; Bilateral carotid artery disease; Other and unspecified hyperlipidemia; Lack of coordination; Muscle weakness (generalized); Hyperlipidemia; Bronchitis; Diastolic CHF, acute; Dyspnea; Acute on chronic renal failure; Cough; and Anemia on her problem list.     has No Known Allergies.  [name] does not currently have medications on file.  Past Surgical History  Procedure Laterality Date  . No past surgeries    . Lung biopsy  10/13/11    left  . Abdominal hysterectomy  1975  . Varicose vein surgery  9yr ago  . Video assisted thoracoscopy  11/18/2011    Procedure: VIDEO ASSISTED THORACOSCOPY;  Surgeon: SMelrose Nakayama MD;  [facility]  Service: Thoracic;  Laterality: Left;  . Lobectomy  11/18/2011    Procedure: LOBECTOMY;  Surgeon: SMelrose Nakayama MD;  Location: [facility]  Service: Thoracic;  Laterality: Left;  LEFT UPPER LOBECTOMY  . Breast biopsy  early 2000    Denies any headaches, dizziness, double vision, fevers, chills, night sweats, nausea, vomiting, diarrhea, constipation, chest pain, heart palpitations, shortness of breath, blood in stool, black tarry stool, urinary pain, urinary burning, urinary frequency, hematuria.   PHYSICAL EXAMINATION  ECOG PERFORMANCE STATUS: 2 - Symptomatic, <50% confined to bed  Filed Vitals:   12/02/14 1400  BP: 134/60  Pulse: 56  Temp: 97.3 F (36.3 C)  Resp: 20    GENERAL:alert, no distress, well nourished, well developed, comfortable, cooperative and smiling SKIN: skin color, texture,  turgor are normal, no rashes or significant lesions HEAD: Normocephalic, No masses, lesions, tenderness or abnormalities EYES: normal, PERRLA, EOMI, Conjunctiva are pink and non-injected EARS: External ears normal OROPHARYNX:lips, buccal mucosa, and tongue normal and mucous membranes are moist  NECK: supple, trachea midline LYMPH:  no palpable lymphadenopathy BREAST:not examined LUNGS: clear to auscultation  HEART: regular rate & rhythm, no murmurs and no gallops ABDOMEN:abdomen soft and normal bowel sounds BACK: Back symmetric, no curvature. EXTREMITIES:less then 2 second capillary refill, no joint deformities, effusion, or inflammation, no skin discoloration, no cyanosis.  Right sided weakness. NEURO: alert & oriented x 3, right sided weakness from stroke with speech impairment.   LABORATORY DATA: CBC    Component Value Date/Time   WBC 6.5 12/02/2014 1412   RBC 3.88 12/02/2014 1412   HGB 12.6 12/02/2014 1412   HCT 37.9 12/02/2014 1412   PLT 185 12/02/2014 1412   MCV 97.7 12/02/2014 1412   MCH 32.5 12/02/2014 1412   MCHC 33.2 12/02/2014 1412   RDW 12.5 12/02/2014 1412   LYMPHSABS 1.0 12/02/2014 1412   MONOABS 0.7 12/02/2014 1412   EOSABS 0.2 12/02/2014 1412   BASOSABS 0.0 12/02/2014 1412      Chemistry  Component Value Date/Time   NA 137 12/02/2014 1412   K 4.2 12/02/2014 1412   CL 104 12/02/2014 1412   CO2 29 12/02/2014 1412   BUN 19 12/02/2014 1412   CREATININE 1.35* 12/02/2014 1412      Component Value Date/Time   CALCIUM 9.0 12/02/2014 1412   ALKPHOS 83 12/02/2014 1412   AST 23 12/02/2014 1412   ALT 13 12/02/2014 1412   BILITOT 0.5 12/02/2014 1412       RADIOGRAPHIC STUDIES:  Dg Chest 2 View  11/08/2014   CLINICAL DATA:  Chest congestion, non productive cough  EXAM: CHEST  2 VIEW  COMPARISON:  CT chest dated 10/16/2014  FINDINGS: Chronic image markings/emphysematous changes with fibrosis. No pleural effusion or pneumothorax.  Volume loss in the left  hemithorax with leftward cardiomediastinal shift. The heart is normal in size.  Degenerative changes of the visualized thoracolumbar spine.  IMPRESSION: No evidence of acute cardiopulmonary disease.  Chronic interstitial lung disease with volume loss in the left hemithorax.   Electronically Signed   By: Julian Hy M.D.   On: 11/08/2014 17:49      ASSESSMENT AND PLAN:  Adenocarcinoma of left lung, stage 1 Oncology history updated.  The patient is S/P resection on 11/18/2011 of a stage IB (T2a N0) well-differentiated, EGFR mutated,  adenocarcinoma consistent with bronchoalveolar carcinoma of the left lung via left video-assisted thorascopic approach with mediastinal lymph node dissection along with his left upper lobectomy. This was done for a 3-4 cm mass in left upper lobe picked up in November when she was admitted to this hospital with a stroke. Two other small nodules were noted in the right lung 5 mm and 4 mm which were not diagnostic but worrisome.  She has been followed with serial CT imaging with intrapleural changes concerning for recurrence, but without clear evidence.  No biopsy performed.  Started on 25 mg of Tarceva on 06/25/2014.  Without biopsy proven recurrence, lack of solid radiographic evidence of disease, and inappropriate dose of Tarceva, the patient and family were presented options:  1. Biopsy lesion to evaluate for recurrent disease and evaluate for EGFR mutation.  Presently, we do not know if she has a mutated disease or not, thus the efficacy and utility of Tarceva is questionable, in addition to its dose.  The patient is not interested in a biopsy at this time given her past history with previous biopsy of lung mass in 2013. 2. Continue Tarceva, and consider increased dose to 100 mg daily (at least).  Continue to follow scans every 3 months.  Unfortunately, given her admission to SNF, the family is having a difficult time getting the medication due to regulations.  3. Stop  Tarceva and repeat scans in March 2016.  The dose of Tarceva is likely ineffective.  She does not have biopsy proven disease, with poor correlation with imaging studies.   She opted for choice #3 with is reasonable.  Thus, we will perform labs in 6 weeks: CBC diff, CMET.  Labs today as planned given treatment with Tarceva: CBC diff, CMET.  D/C Tarceva 25 mg daily.  CT chest without contrast in March 2016.  Based upon those results, further recommendations will be provided.  Return in 6 weeks for follow-up.    THERAPY PLAN:  D/C Tarceva.  Restaging scans in March 2016.  Return following scans.  All questions were answered. The patient knows to call the clinic with any problems, questions or concerns. We can certainly see the  patient much sooner if necessary.  Patient and plan discussed with Dr. Ancil Linsey and she is in agreement with the aforementioned.   KEFALAS,THOMAS 12/02/2014

## 2014-12-02 ENCOUNTER — Encounter (HOSPITAL_COMMUNITY): Payer: Medicare Other

## 2014-12-02 ENCOUNTER — Encounter (HOSPITAL_COMMUNITY): Payer: Self-pay | Admitting: Oncology

## 2014-12-02 ENCOUNTER — Encounter (HOSPITAL_COMMUNITY): Payer: Medicare Other | Attending: Hematology and Oncology | Admitting: Oncology

## 2014-12-02 VITALS — BP 134/60 | HR 56 | Temp 97.3°F | Resp 20 | Wt 146.8 lb

## 2014-12-02 DIAGNOSIS — C3492 Malignant neoplasm of unspecified part of left bronchus or lung: Secondary | ICD-10-CM | POA: Insufficient documentation

## 2014-12-02 DIAGNOSIS — C3412 Malignant neoplasm of upper lobe, left bronchus or lung: Secondary | ICD-10-CM | POA: Diagnosis not present

## 2014-12-02 DIAGNOSIS — C801 Malignant (primary) neoplasm, unspecified: Secondary | ICD-10-CM | POA: Diagnosis present

## 2014-12-02 LAB — COMPREHENSIVE METABOLIC PANEL
ALK PHOS: 83 U/L (ref 39–117)
ALT: 13 U/L (ref 0–35)
ANION GAP: 4 — AB (ref 5–15)
AST: 23 U/L (ref 0–37)
Albumin: 3.5 g/dL (ref 3.5–5.2)
BILIRUBIN TOTAL: 0.5 mg/dL (ref 0.3–1.2)
BUN: 19 mg/dL (ref 6–23)
CO2: 29 mmol/L (ref 19–32)
Calcium: 9 mg/dL (ref 8.4–10.5)
Chloride: 104 mmol/L (ref 96–112)
Creatinine, Ser: 1.35 mg/dL — ABNORMAL HIGH (ref 0.50–1.10)
GFR, EST AFRICAN AMERICAN: 44 mL/min — AB (ref >90)
GFR, EST NON AFRICAN AMERICAN: 38 mL/min — AB (ref >90)
Glucose, Bld: 109 mg/dL — ABNORMAL HIGH (ref 70–99)
POTASSIUM: 4.2 mmol/L (ref 3.5–5.1)
Sodium: 137 mmol/L (ref 135–145)
Total Protein: 7.1 g/dL (ref 6.0–8.3)

## 2014-12-02 LAB — CBC WITH DIFFERENTIAL/PLATELET
Basophils Absolute: 0 10*3/uL (ref 0.0–0.1)
Basophils Relative: 0 % (ref 0–1)
EOS PCT: 3 % (ref 0–5)
Eosinophils Absolute: 0.2 10*3/uL (ref 0.0–0.7)
HCT: 37.9 % (ref 36.0–46.0)
HEMOGLOBIN: 12.6 g/dL (ref 12.0–15.0)
LYMPHS ABS: 1 10*3/uL (ref 0.7–4.0)
Lymphocytes Relative: 16 % (ref 12–46)
MCH: 32.5 pg (ref 26.0–34.0)
MCHC: 33.2 g/dL (ref 30.0–36.0)
MCV: 97.7 fL (ref 78.0–100.0)
Monocytes Absolute: 0.7 10*3/uL (ref 0.1–1.0)
Monocytes Relative: 11 % (ref 3–12)
NEUTROS ABS: 4.6 10*3/uL (ref 1.7–7.7)
Neutrophils Relative %: 70 % (ref 43–77)
PLATELETS: 185 10*3/uL (ref 150–400)
RBC: 3.88 MIL/uL (ref 3.87–5.11)
RDW: 12.5 % (ref 11.5–15.5)
WBC: 6.5 10*3/uL (ref 4.0–10.5)

## 2014-12-02 NOTE — Assessment & Plan Note (Signed)
Oncology history updated.  The patient is S/P resection on 11/18/2011 of a stage IB (T2a N0) well-differentiated, EGFR mutated,  adenocarcinoma consistent with bronchoalveolar carcinoma of the left lung via left video-assisted thorascopic approach with mediastinal lymph node dissection along with his left upper lobectomy. This was done for a 3-4 cm mass in left upper lobe picked up in November when she was admitted to this hospital with a stroke. Two other small nodules were noted in the right lung 5 mm and 4 mm which were not diagnostic but worrisome.  She has been followed with serial CT imaging with intrapleural changes concerning for recurrence, but without clear evidence.  No biopsy performed.  Started on 25 mg of Tarceva on 06/25/2014.  Without biopsy proven recurrence, lack of solid radiographic evidence of disease, and inappropriate dose of Tarceva, the patient and family were presented options:  1. Biopsy lesion to evaluate for recurrent disease and evaluate for EGFR mutation.  Presently, we do not know if she has a mutated disease or not, thus the efficacy and utility of Tarceva is questionable, in addition to its dose.  The patient is not interested in a biopsy at this time given her past history with previous biopsy of lung mass in 2013. 2. Continue Tarceva, and consider increased dose to 100 mg daily (at least).  Continue to follow scans every 3 months.  Unfortunately, given her admission to SNF, the family is having a difficult time getting the medication due to regulations.  3. Stop Tarceva and repeat scans in March 2016.  The dose of Tarceva is likely ineffective.  She does not have biopsy proven disease, with poor correlation with imaging studies.   She opted for choice #3 with is reasonable.  Thus, we will perform labs in 6 weeks: CBC diff, CMET.  Labs today as planned given treatment with Tarceva: CBC diff, CMET.  D/C Tarceva 25 mg daily.  CT chest without contrast in March 2016.  Based upon  those results, further recommendations will be provided.  Return in 6 weeks for follow-up.

## 2014-12-02 NOTE — Progress Notes (Signed)
Stephanie [name] presented for labwork. Labs per MD order drawn via Peripheral Line 23 gauge needle inserted in right AC  Good blood return present. Procedure without incident.  Needle removed intact. Patient tolerated procedure well.

## 2014-12-02 NOTE — Patient Instructions (Signed)
[place] at [facility]  Discharge Instructions:  Labs today.  Stop Tarceva pill. CT scan in March 2016 Return in March 2016 for follow-up _______________________________________________________________  Thank you for choosing [place] at [facility] to provide your oncology and hematology care.  To afford each patient quality time with our providers, please arrive at least 15 minutes before your scheduled appointment.  You need to re-schedule your appointment if you arrive 10 or more minutes late.  We strive to give you quality time with our providers, and arriving late affects you and other patients whose appointments are after yours.  Also, if you no show three or more times for appointments you may be dismissed from the clinic.  Again, thank you for choosing [place] at [place] hope is that these requests will allow you access to exceptional care and in a timely manner. _______________________________________________________________  If you have questions after your visit, please contact our office at [phone] between the hours of 8:30 a.m. and 5:00 p.m. Voicemails left after 4:30 p.m. will not be returned until the following business day. _______________________________________________________________  For prescription refill requests, have your pharmacy contact our office. _______________________________________________________________  Recommendations made by the consultant and any test results will be sent to your referring physician. _______________________________________________________________

## 2014-12-17 ENCOUNTER — Encounter: Payer: Self-pay | Admitting: Internal Medicine

## 2014-12-17 ENCOUNTER — Non-Acute Institutional Stay (SKILLED_NURSING_FACILITY): Payer: Medicare Other | Admitting: Internal Medicine

## 2014-12-17 DIAGNOSIS — I5031 Acute diastolic (congestive) heart failure: Secondary | ICD-10-CM | POA: Diagnosis not present

## 2014-12-17 DIAGNOSIS — I69322 Dysarthria following cerebral infarction: Secondary | ICD-10-CM

## 2014-12-17 DIAGNOSIS — N179 Acute kidney failure, unspecified: Secondary | ICD-10-CM | POA: Diagnosis not present

## 2014-12-17 DIAGNOSIS — N189 Chronic kidney disease, unspecified: Secondary | ICD-10-CM | POA: Diagnosis not present

## 2014-12-17 DIAGNOSIS — I10 Essential (primary) hypertension: Secondary | ICD-10-CM | POA: Diagnosis not present

## 2014-12-17 NOTE — Progress Notes (Signed)
Patient ID: Stephanie [name], female   DOB: 26-Aug-1941, 74 y.o.   MRN: [id number]   this is a routine visit.  Level care skilled.  Facility [facility].   CHIEF COMPLAINT: Medical management of CVA late effect-hypertension-depression-CHF-acute visit secondary to nasal congestion.    HISTORY OF PRESENT ILLNESS:  This is a patient who came to [place] on 10/19/2014.  She had been on [facility] with shortness of breath.    She has a history of metastatic adeno-CA of the lung-- was on Tarceva.--But this has been discontinued per oncology note earlier this month-    Her major disability is a late-effect dominant hemisphere CVA, which I think occurred some two years before this.    She also has diastolic heart failure.    I She appears to be quite stable has gained strength during her stay here.  She is on Lasix every other day 20 mg for a history of CHF her weight appears to be relatively stable with her admission weight.  She also has a history of hypertension on atenolol and Norvasc this appears stable most recent blood pressures 120/76 127/68   She does have a history of CVA with some right-sided weakness and dysarthria-she is on aspirin for anticoagulation-again she appears to be somewhat stronger than when she first came here.  Her only complaint today is some persistent nasal congestion she does not really complain of any sore throat or shortness of breath or cough-she is taking nasal spray normal saline but not helping much.    Family medical social history reviewed per oncology note 12/02/2014 as well as admission note 10/19/2014.  Medications have been reviewed per [facility]  Review of systems-somewhat limited secondary dysarthria   In general no complaints of fever or chills.  Respiratory does not complain of cough or shortness of breath.  Head ears eyes nose mouth and throat only complaint is nasal stuffiness.  Respiratory no complaints of cough or shortness of breath.  Cardiac no  chest pain minimal lower extremity edema.  GI does not complain of any nausea vomiting diarrhea constipation or abdominal discomfort.  Muscle skeletal does not complain of joint pain per patient and nursing staff.  Neurologic is not complaining of dizziness or headache does have right-sided weakness with history of CVA   PHYSICAL EXAMINATION Temperature is 97.9 pulse 67 respirations 18 blood pressure 128/76 In general this is a pleasant elderly female who appears a bit stronger than when I saw her last time.  Her skin is warm and dry.  Eyes sclera and conjunctiva are clear visual acuity appears grossly intact.  Nose  did not appreciate any active drainage.  Oropharynx is clear mucous membranes moist.  :   CHEST/RESPIRATORY:  Shallow, but otherwise clear air entry bilaterally.   CARDIOVASCULAR:  CARDIAC:   Heart sounds are normal.  She appears to be euvolemic rate and rhythm there is minimal lower extremity edema.   Abdomen is soft nontender with positive bowel sounds  NEUROLOGICAL:    CRANIAL NERVES:  She has a cortically-based language problem, but can converse.   SENSATION/STRENGTH:  She has right upper extremity greater than right lower extremity weakness, although this seems to continue to improve  Labs.  12/03/2014.  Sodium 138 potassium 4.4 BUN 19 creatinine 1.39.  11/19/2014.  WBC 6.3 hemoglobin 13.9 platelets 216.      ASSESSMENT/PLAN:  Late-effect dominant hemisphere CVA.  Probably this is fairly distant she appears to have gained some strength she is on aspirin for anticoagulation    Diastolic heart failure.  At this point this appears stable on low-dose Lasix will update a metabolic panel keep an eye on renal function and electrolytes.    History of adeno-CA of the lung. She is followed by oncology per consultation with family has discontinued the Tarceva.  .  Anemia.  She has an OB-positive stool, but hemoglobin show stability  we will update CBC  History of renal insufficiency this appears to be stable will update metabolic panel.    History of hypertension this appears stable current medications as noted above.    Depression this actually appears to be stable on Celexa.                                                                                         Nasal congestion-will do a trial course of Afrin nasal spray 5 days and monitor    [phone]

## 2014-12-18 ENCOUNTER — Other Ambulatory Visit (HOSPITAL_COMMUNITY)
Admission: AD | Admit: 2014-12-18 | Discharge: 2014-12-18 | Disposition: A | Discharge status: Home / Self Care | Payer: Medicare Other | Source: Skilled Nursing Facility | Attending: Internal Medicine | Admitting: Internal Medicine

## 2014-12-18 DIAGNOSIS — E785 Hyperlipidemia, unspecified: Secondary | ICD-10-CM | POA: Insufficient documentation

## 2014-12-18 DIAGNOSIS — I10 Essential (primary) hypertension: Secondary | ICD-10-CM | POA: Diagnosis present

## 2014-12-18 DIAGNOSIS — I5023 Acute on chronic systolic (congestive) heart failure: Secondary | ICD-10-CM | POA: Insufficient documentation

## 2014-12-18 LAB — BASIC METABOLIC PANEL
Anion gap: 3 — ABNORMAL LOW (ref 5–15)
BUN: 23 mg/dL (ref 6–23)
CO2: 29 mmol/L (ref 19–32)
Calcium: 9 mg/dL (ref 8.4–10.5)
Chloride: 106 mmol/L (ref 96–112)
Creatinine, Ser: 1.39 mg/dL — ABNORMAL HIGH (ref 0.50–1.10)
GFR calc Af Amer: 43 mL/min — ABNORMAL LOW (ref >90)
GFR calc non Af Amer: 37 mL/min — ABNORMAL LOW (ref >90)
GLUCOSE: 104 mg/dL — AB (ref 70–99)
Potassium: 4.6 mmol/L (ref 3.5–5.1)
SODIUM: 138 mmol/L (ref 135–145)

## 2014-12-18 LAB — CBC
HCT: 36.4 % (ref 36.0–46.0)
Hemoglobin: 12 g/dL (ref 12.0–15.0)
MCH: 32.4 pg (ref 26.0–34.0)
MCHC: 33 g/dL (ref 30.0–36.0)
MCV: 98.4 fL (ref 78.0–100.0)
Platelets: 150 10*3/uL (ref 150–400)
RBC: 3.7 MIL/uL — AB (ref 3.87–5.11)
RDW: 12.5 % (ref 11.5–15.5)
WBC: 4.7 10*3/uL (ref 4.0–10.5)

## 2014-12-30 ENCOUNTER — Telehealth: Payer: Self-pay | Admitting: Family Medicine

## 2014-12-30 NOTE — Telephone Encounter (Signed)
pts brother is calling to say that you said at a previous appt that he Can call back at anytime to speak to you.   He would like to speak to you in regards to her health, an her  Stay at the [facility]. They are not happy at this point, an  She wants to go home, family says she can do so.   After hours [phone]

## 2015-01-14 ENCOUNTER — Ambulatory Visit (HOSPITAL_COMMUNITY)
Admit: 2015-01-14 | Discharge: 2015-01-14 | Disposition: A | Discharge status: Home / Self Care | Payer: Medicare Other | Source: Ambulatory Visit | Attending: Oncology | Admitting: Oncology

## 2015-01-14 ENCOUNTER — Ambulatory Visit (HOSPITAL_COMMUNITY): Payer: Self-pay | Admitting: Hematology & Oncology

## 2015-01-14 ENCOUNTER — Ambulatory Visit (HOSPITAL_COMMUNITY): Payer: Medicare Other

## 2015-01-14 DIAGNOSIS — R918 Other nonspecific abnormal finding of lung field: Secondary | ICD-10-CM | POA: Diagnosis not present

## 2015-01-14 DIAGNOSIS — R911 Solitary pulmonary nodule: Secondary | ICD-10-CM | POA: Diagnosis not present

## 2015-01-14 DIAGNOSIS — Z08 Encounter for follow-up examination after completed treatment for malignant neoplasm: Secondary | ICD-10-CM | POA: Insufficient documentation

## 2015-01-14 DIAGNOSIS — C3492 Malignant neoplasm of unspecified part of left bronchus or lung: Secondary | ICD-10-CM | POA: Diagnosis not present

## 2015-01-16 ENCOUNTER — Ambulatory Visit (HOSPITAL_COMMUNITY): Payer: Self-pay | Admitting: Hematology & Oncology

## 2015-01-19 ENCOUNTER — Encounter (HOSPITAL_COMMUNITY): Payer: Medicare Other | Attending: Hematology and Oncology | Admitting: Hematology & Oncology

## 2015-01-19 VITALS — BP 130/54 | HR 66 | Resp 16 | Wt 148.0 lb

## 2015-01-19 DIAGNOSIS — C3412 Malignant neoplasm of upper lobe, left bronchus or lung: Secondary | ICD-10-CM | POA: Diagnosis not present

## 2015-01-19 DIAGNOSIS — C801 Malignant (primary) neoplasm, unspecified: Secondary | ICD-10-CM | POA: Insufficient documentation

## 2015-01-19 DIAGNOSIS — C3492 Malignant neoplasm of unspecified part of left bronchus or lung: Secondary | ICD-10-CM | POA: Insufficient documentation

## 2015-01-19 DIAGNOSIS — I639 Cerebral infarction, unspecified: Secondary | ICD-10-CM

## 2015-01-19 NOTE — Progress Notes (Signed)
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 Tarceva 25 mg daily  Initial surgical resection 11/18/2011 T2a, pN0  Adenocarcinoma LUL  Status post left cerebral CVA with right hemiparesis,    CURRENT THERAPY: None  INTERVAL HISTORY:  Stephanie [name] 74 y.o. female returns for follow-up of her lung cancer. She now resides at the [facility] She has been unable to obtain Tarceva secondary to the cost. She is here with her daughter today. She just underwent repeat CT imaging of the chest and is here to review the results. She is still not willing to proceed with biopsy if needed.    MEDICAL HISTORY: Past Medical History  Diagnosis Date  . CVA (cerebral infarction)     speech difficulities  . Stroke   . Hypercholesteremia     Takes Zocor daily  . Hypertension     takes Atenolol and Lisinopril daily  . Lung mass     left upper lobe  . Arthritis     hands  . TIA (transient ischemic attack)     Sat before thanksgiving;impaired speech  . Pneumothorax   . Lung cancer     has CVA (cerebral infarction); HTN (hypertension); Pneumothorax of left lung after biopsy; Adenocarcinoma of left lung, stage 1; Dysarthria as late effect of stroke; Bilateral carotid artery disease; Other and unspecified hyperlipidemia; Lack of coordination; Muscle weakness (generalized); Hyperlipidemia; Bronchitis; Diastolic CHF, acute; Dyspnea; Acute on chronic renal failure; Cough; and Anemia on her problem list.      Adenocarcinoma of left lung, stage 1   10/19/2011 Imaging PET/CT showing activity in LUL lesion, no mediastinal metastasis. no evidence of distant mestastasis   10/19/2011 Initial Diagnosis Adenocarcinoma with Exon 19 deletion, ALK-   11/08/2011 Surgery Thoracoscopic left upper lobectomy with mediastinal exploration, T2aN0 adenocarcinoma with bronchoalveolar   characteristics,  4.3cm, exon 19 deletion, ALK negative, Dr. Roxan Hockey   07/31/2013 Imaging CT chest- Stable postoperative and post therapy change in the left hemi- thorax. No evidence of lung cancer recurrence.   02/12/2014 Imaging Ct chest- The previously seen pulmonary nodules are stable. However, there appears to be new ground-glass pulmonary nodules in the right upper lobe. These are small and nonspecific. Cannot exclude metastases.   05/26/2014 Imaging CT chest- Right lung nodules are grossly stable from 02/12/2014 but appear new/enlarged from 07/16/2012, raising concern for bronchogenic carcinoma. Pulmonary parenchymal pattern of fibrosis does not appear progressive from 07/16/2012   06/25/2014 - 11/30/2014 Chemotherapy Tarceva 25 mg daily started by Dr. Barnet Glasgow ([facility] Tenen)   10/16/2014 Imaging CT chest- Stable nodules in right lung. The largest nodule in right lower lobe measures 1.6 x 1.5 cm stable from prior exam. No definite new pulmonary nodules are noted.   12/02/2014 Treatment Plan Change Inappropriate use of Tarceva without biopsy proven disease recurrence and inappropriately low dose.   01/14/2015 Imaging CT chest, Continued slow growth of multiple ground-glass and sub solid right lung nodules compared with older prior studies. Findings remain suspicious for metastatic disease or development of multicentric adenocarcinoma.     has No Known Allergies.  [name] had no medications administered during this visit.  SURGICAL HISTORY: Past Surgical History  Procedure Laterality Date  . No past surgeries    . Lung biopsy  10/13/11    left  . Abdominal hysterectomy  1975  . Varicose vein surgery  80yr ago  .  Video assisted thoracoscopy  11/18/2011    Procedure: VIDEO ASSISTED THORACOSCOPY;  Surgeon: Melrose Nakayama, MD;  Location: [place];  Service: Thoracic;  Laterality: Left;  . Lobectomy  11/18/2011    Procedure: LOBECTOMY;  Surgeon: Melrose Nakayama, MD;  Location: [place];   Service: Thoracic;  Laterality: Left;  LEFT UPPER LOBECTOMY  . Breast biopsy  early 2000    SOCIAL HISTORY: History   Social History  . Marital Status: Widowed    Spouse Name: N/A  . Number of Children: N/A  . Years of Education: N/A   Occupational History  . Not on file.   Social History Main Topics  . Smoking status: Never Smoker   . Smokeless tobacco: Never Used  . Alcohol Use: No  . Drug Use: No  . Sexual Activity: Not Currently    Birth Control/ Protection: Post-menopausal, Surgical   Other Topics Concern  . Not on file   Social History Narrative    FAMILY HISTORY: Family History  Problem Relation Age of Onset  . Stroke Mother   . Stroke Sister   . Anesthesia problems Neg Hx   . Hypotension Neg Hx   . Malignant hyperthermia Neg Hx   . Pseudochol deficiency Neg Hx   . Diabetes Brother   . Cancer Brother   . Stroke Brother     Review of Systems  Constitutional: Positive for malaise/fatigue.  HENT: Negative.   Eyes: Negative.   Respiratory: Positive for shortness of breath.   Cardiovascular: Negative.   Gastrointestinal: Negative.   Genitourinary: Positive for urgency and frequency.  Musculoskeletal: Negative.   Skin: Negative.   Neurological: Positive for sensory change, speech change, focal weakness and weakness.  Endo/Heme/Allergies: Negative.   Psychiatric/Behavioral: Negative.     PHYSICAL EXAMINATION  ECOG PERFORMANCE STATUS: 1 - Symptomatic but completely ambulatory  Uses a walker at home  Filed Vitals:   01/19/15 1410  BP: 130/54  Pulse: 66  Resp: 16    Physical Exam  Constitutional: She is oriented to person, place, and time and well-developed, well-nourished, and in no distress.  Mild R arm hemiparesis. Struggles with speech. Gets onto the exam table with assistance  HENT:  Head: Normocephalic and atraumatic.  Mouth/Throat: Oropharynx is clear and moist. No oropharyngeal exudate.  Eyes: Conjunctivae and EOM are normal. Pupils are  equal, round, and reactive to light. No scleral icterus.  Neck: Normal range of motion. Neck supple. No tracheal deviation present. No thyromegaly present.  Cardiovascular: Normal rate and regular rhythm.   Murmur heard. Pulmonary/Chest: Effort normal.  Crackles at base L>R, occasional expiratory "peep" LLL  Abdominal: Soft. Bowel sounds are normal. She exhibits no distension and no mass. There is no tenderness. There is no rebound and no guarding.  Musculoskeletal: Normal range of motion.  Can raise R arm, has decent hand grip  Lymphadenopathy:    She has no cervical adenopathy.  Neurological: She is alert and oriented to person, place, and time. No cranial nerve deficit.  Obvious speech impaiment  Skin: Skin is warm and dry.  Psychiatric:  Mood appropriate but flat    LABORATORY DATA:  CBC    Component Value Date/Time   WBC 4.7 12/18/2014 0700   RBC 3.70* 12/18/2014 0700   HGB 12.0 12/18/2014 0700   HCT 36.4 12/18/2014 0700   PLT 150 12/18/2014 0700   MCV 98.4 12/18/2014 0700   MCH 32.4 12/18/2014 0700   MCHC 33.0 12/18/2014 0700   RDW 12.5 12/18/2014 0700  LYMPHSABS 1.0 12/02/2014 1412   MONOABS 0.7 12/02/2014 1412   EOSABS 0.2 12/02/2014 1412   BASOSABS 0.0 12/02/2014 1412   CMP     Component Value Date/Time   NA 138 12/18/2014 0700   K 4.6 12/18/2014 0700   CL 106 12/18/2014 0700   CO2 29 12/18/2014 0700   GLUCOSE 104* 12/18/2014 0700   BUN 23 12/18/2014 0700   CREATININE 1.39* 12/18/2014 0700   CALCIUM 9.0 12/18/2014 0700   PROT 7.1 12/02/2014 1412   ALBUMIN 3.5 12/02/2014 1412   AST 23 12/02/2014 1412   ALT 13 12/02/2014 1412   ALKPHOS 83 12/02/2014 1412   BILITOT 0.5 12/02/2014 1412   GFRNONAA 37* 12/18/2014 0700   GFRAA 43* 12/18/2014 0700    ASSESSMENT and THERAPY PLAN:   T2 N0 adenocarcinoma of the left upper lung status post resection in 2013 Progressive stage IV disease based on CT imaging, no biopsy 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 She again is too fearful to proceed with a biopsy given her past experience. We have discussed several options including restarting Tarceva at a higher dose. However her daughter states they are unable to obtain it now that her mother is in the [facility]  We discussed other options for therapy including Iressa or gilotrif. This may be more reasonable given her prior long-term exposure to low-dose Tarceva. In addition if we get her on a tolerable recommended dose and her scans improved it certainly confirms our suspicion of recurrent stage IV disease. We will work to try to obtain one of these medications for the patient when they are available I have advised him to start the medication and we will see her 1 week post with labs.  All questions were answered. The patient knows to call the clinic with any problems, questions or concerns. We can certainly see the patient much sooner if necessary.  This note was signed electronically Molli Hazard MD 02/08/2015

## 2015-01-19 NOTE — Patient Instructions (Signed)
[place] at [facility] Discharge Instructions  RECOMMENDATIONS MADE BY THE CONSULTANT AND ANY TEST RESULTS WILL BE SENT TO YOUR REFERRING PHYSICIAN.  Increase Celexa to 40mg  daily. Restoril 15mg  every bedtime. Gilotrif 20mg  daily (we will work on getting this medication for you). Return to clinic in 1 month. Afatinib tablets What is this medicine? AFATINIB (a FA ti nib) is a chemotherapy drug. It targets a specific protein within cancer cells and stops the cancer cells from growing. This medicine is used to treat non-small cell lung cancer. This medicine may be used for other purposes; ask your health care provider or pharmacist if you have questions. COMMON BRAND NAME(S): GILOTRIF What should I tell my health care provider before I take this medicine? They need to know if you have any of these conditions: -eye disease, vision problems, or if you wear contact lenses -heart disease -kidney disease -liver disease -lung or breathing disease -an unusual or allergic reaction to afatinib, other medicines, foods, dyes, or preservatives -pregnant or trying to get pregnant -breast-feeding How should I use this medicine? Take by mouth with a glass of water. Follow the directions on the prescription label. Take this medicine on an empty stomach, at least 1 hour before or 2 hours after food. Do not take with food. Use exactly as directed. Take your medicine at regular intervals. Do not take your medicine more often than directed. Talk to your pediatrician regarding the use of this medicine in children. Special care may be needed. Overdosage: If you think you've taken too much of this medicine contact a poison control center or emergency room at once. Overdosage: If you think you have taken too much of this medicine contact a poison control center or emergency room at once. NOTE: This medicine is only for you. Do not share this medicine with others. What if I miss a  dose? Take your missed dose as soon as you remember. If your next dose is to be taken in less than 12 hours, then do not take the missed dose. Take the next dose at your regular time. Do not take double or extra doses. What may interact with this medicine? -amiodarone -carbamazepine -certain medicines for fungal infections like ketoconazole and itraconazole -cyclosporine A -erythromycin -grapefruit juice -nelfinavir -phenobarbital -phenytoin -quinidine -rifampicin -ritonavir -saquinavir -St. John's wort -tacrolimus -verapamil This list may not describe all possible interactions. Give your health care provider a list of all the medicines, herbs, non-prescription drugs, or dietary supplements you use. Also tell them if you smoke, drink alcohol, or use illegal drugs. Some items may interact with your medicine. What should I watch for while using this medicine? Visit your doctor for regular check ups. Report any side effects. Continue your course of treatment unless your doctor tells you to stop. You will need blood work done while you are taking this medicine. If you experience any of the following, contact your health care provider: eye irritation; rash; severe or continuing diarrhea, nausea, decreased appetite, or vomiting; or if your breathing gets worse or you develop shortness of breath or cough. Do not become pregnant while taking this medicine or for 2 weeks after stopping it. Women should inform their doctor if they wish to become pregnant or think they might be pregnant. There is a potential for serious side effects to an unborn child. Talk to your health care professional or pharmacist for more information. Do not breast-feed an infant while taking this medicine. What side effects may I  notice from receiving this medicine? Side effects that you should report to your doctor or health care professional as soon as possible: -allergic reactions like skin rash, itching or hives, swelling  of the face, lips, or tongue -bloody or black tarry stools -eye irritation -eye pain -fever -mouth sores -problems related to breathing including shortness of breath or cough -severe or persistent diarrhea, nausea, vomiting, or loss of appetite -spitting up blood or brown material that looks like coffee grounds -unusual bleeding or bruising Side effects that usually do not require medical attention (Report these to your doctor or health care professional if they continue or are bothersome.): -acne -diarrhea -dry skin -itching -loss of appetite -nausea -weak or tired -weight loss This list may not describe all possible side effects. Call your doctor for medical advice about side effects. You may report side effects to FDA at 1-800-FDA-1088. Where should I keep my medicine? Keep out of the reach of children. Store between 20 and 25 degrees C (68 and 77 degrees F). Throw away any unused medicine after the expiration date. NOTE: This sheet is a summary. It may not cover all possible information. If you have questions about this medicine, talk to your doctor, pharmacist, or health care provider.  2015, Elsevier/Gold Standard. (2012-06-07 14:25:02)   Thank you for choosing [place] at [facility] to provide your oncology and hematology care.  To afford each patient quality time with our provider, please arrive at least 15 minutes before your scheduled appointment time.    You need to re-schedule your appointment should you arrive 10 or more minutes late.  We strive to give you quality time with our providers, and arriving late affects you and other patients whose appointments are after yours.  Also, if you no show three or more times for appointments you may be dismissed from the clinic at the providers discretion.     Again, thank you for choosing [facility].  Our hope is that these requests will decrease the amount of time that you wait before being  seen by our physicians.       _____________________________________________________________  Should you have questions after your visit to [facility], please contact our office at [phone] between the hours of 8:30 a.m. and 4:30 p.m.  Voicemails left after 4:30 p.m. will not be returned until the following business day.  For prescription refill requests, have your pharmacy contact our office.

## 2015-01-28 ENCOUNTER — Other Ambulatory Visit (HOSPITAL_COMMUNITY): Payer: Self-pay | Admitting: Hematology & Oncology

## 2015-01-28 MED ORDER — GEFITINIB 250 MG PO TABS: 250.0000 mg | ORAL_TABLET | Freq: Every day | ORAL | Status: DC

## 2015-02-08 ENCOUNTER — Encounter (HOSPITAL_COMMUNITY): Payer: Self-pay | Admitting: Hematology & Oncology

## 2015-02-10 ENCOUNTER — Encounter (HOSPITAL_COMMUNITY): Payer: Self-pay | Admitting: Oncology

## 2015-02-11 NOTE — Telephone Encounter (Signed)
It should be noted that I did call the patient's sons number and left a message on never heard anything back, certainly if he calls I'll be happy to talk with him

## 2015-02-18 ENCOUNTER — Encounter (HOSPITAL_COMMUNITY): Payer: Medicare Other | Attending: Hematology and Oncology | Admitting: Oncology

## 2015-02-18 VITALS — BP 107/56 | HR 73 | Temp 97.7°F | Resp 14 | Wt 150.8 lb

## 2015-02-18 DIAGNOSIS — C3412 Malignant neoplasm of upper lobe, left bronchus or lung: Secondary | ICD-10-CM

## 2015-02-18 DIAGNOSIS — C801 Malignant (primary) neoplasm, unspecified: Secondary | ICD-10-CM | POA: Insufficient documentation

## 2015-02-18 DIAGNOSIS — C3492 Malignant neoplasm of unspecified part of left bronchus or lung: Secondary | ICD-10-CM | POA: Diagnosis present

## 2015-02-18 DIAGNOSIS — G4700 Insomnia, unspecified: Secondary | ICD-10-CM

## 2015-02-18 LAB — COMPREHENSIVE METABOLIC PANEL
ALBUMIN: 4 g/dL (ref 3.5–5.2)
ALT: 13 U/L (ref 0–35)
AST: 22 U/L (ref 0–37)
Alkaline Phosphatase: 82 U/L (ref 39–117)
Anion gap: 9 (ref 5–15)
BILIRUBIN TOTAL: 0.6 mg/dL (ref 0.3–1.2)
BUN: 32 mg/dL — AB (ref 6–23)
CALCIUM: 9 mg/dL (ref 8.4–10.5)
CHLORIDE: 102 mmol/L (ref 96–112)
CO2: 27 mmol/L (ref 19–32)
CREATININE: 1.78 mg/dL — AB (ref 0.50–1.10)
GFR calc Af Amer: 31 mL/min — ABNORMAL LOW (ref >90)
GFR calc non Af Amer: 27 mL/min — ABNORMAL LOW (ref >90)
Glucose, Bld: 106 mg/dL — ABNORMAL HIGH (ref 70–99)
Potassium: 4.2 mmol/L (ref 3.5–5.1)
Sodium: 138 mmol/L (ref 135–145)
Total Protein: 7.7 g/dL (ref 6.0–8.3)

## 2015-02-18 LAB — CBC WITH DIFFERENTIAL/PLATELET
BASOS ABS: 0 10*3/uL (ref 0.0–0.1)
Basophils Relative: 0 % (ref 0–1)
EOS PCT: 5 % (ref 0–5)
Eosinophils Absolute: 0.2 10*3/uL (ref 0.0–0.7)
HCT: 39.6 % (ref 36.0–46.0)
HEMOGLOBIN: 13.2 g/dL (ref 12.0–15.0)
LYMPHS ABS: 1.1 10*3/uL (ref 0.7–4.0)
Lymphocytes Relative: 23 % (ref 12–46)
MCH: 32.7 pg (ref 26.0–34.0)
MCHC: 33.3 g/dL (ref 30.0–36.0)
MCV: 98 fL (ref 78.0–100.0)
MONO ABS: 0.5 10*3/uL (ref 0.1–1.0)
MONOS PCT: 10 % (ref 3–12)
NEUTROS ABS: 2.9 10*3/uL (ref 1.7–7.7)
Neutrophils Relative %: 62 % (ref 43–77)
Platelets: 162 10*3/uL (ref 150–400)
RBC: 4.04 MIL/uL (ref 3.87–5.11)
RDW: 12.3 % (ref 11.5–15.5)
WBC: 4.8 10*3/uL (ref 4.0–10.5)

## 2015-02-18 MED ORDER — DOXEPIN HCL 3 MG PO TABS: 3.0000 mg | ORAL_TABLET | Freq: Every day | ORAL | Status: DC

## 2015-02-18 NOTE — Assessment & Plan Note (Signed)
questionable progressive adenocarcinoma of the lung, not biopsy proven.  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  Patient refuses biopsy.  Currently on Iressa 250 mg daily.  Tolerating nicely.   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  She will update [place] on effectiveness.   Labs in 2 weeks: CBC diff, CMET.   Return in 2 weeks for follow-up.

## 2015-02-18 NOTE — Patient Instructions (Signed)
[place] at [facility] Discharge Instructions  RECOMMENDATIONS MADE BY THE CONSULTANT AND ANY TEST RESULTS WILL BE SENT TO YOUR REFERRING PHYSICIAN.  Exam and discussion by Robynn Pane, PA-C. Will continue with oral chemotherapy. Will give you a prescription for Doxepin for sleep to get filled once you've taken all of the current sleeping medication since it will not be covered by your insurance in the future. Call with any concerns.  Labs every 2 weeks Office visit in 2 weeks.  Thank you for choosing [place] at [facility] to provide your oncology and hematology care.  To afford each patient quality time with our provider, please arrive at least 15 minutes before your scheduled appointment time.    You need to re-schedule your appointment should you arrive 10 or more minutes late.  We strive to give you quality time with our providers, and arriving late affects you and other patients whose appointments are after yours.  Also, if you no show three or more times for appointments you may be dismissed from the clinic at the providers discretion.     Again, thank you for choosing [facility].  Our hope is that these requests will decrease the amount of time that you wait before being seen by our physicians.       _____________________________________________________________  Should you have questions after your visit to [facility], please contact our office at [phone] between the hours of 8:30 a.m. and 4:30 p.m.  Voicemails left after 4:30 p.m. will not be returned until the following business day.  For prescription refill requests, have your pharmacy contact our office.

## 2015-02-18 NOTE — Progress Notes (Signed)
[name], MD [place] [address]  Adenocarcinoma of left lung, stage 1 - Plan: CBC with Differential, Comprehensive metabolic panel, CBC with Differential, Comprehensive metabolic panel, CBC with Differential, Comprehensive metabolic panel  Insomnia - Plan: Doxepin HCl 3 MG TABS  CURRENT THERAPY: Iressa 250 mg daily beginning on 02/12/2014.  INTERVAL HISTORY: Stephanie [name] 74 y.o. female returns for followup of questionable progressive adenocarcinoma of the lung, not biopsy proven.  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    Adenocarcinoma of left lung, stage 1   10/19/2011 Imaging PET/CT showing activity in LUL lesion, no mediastinal metastasis. no evidence of distant mestastasis   10/19/2011 Initial Diagnosis Adenocarcinoma with Exon 19 deletion, ALK-   11/08/2011 Surgery Thoracoscopic left upper lobectomy with mediastinal exploration, T2aN0 adenocarcinoma with bronchoalveolar  characteristics,  4.3cm, exon 19 deletion, ALK negative, Dr. Roxan Hockey   07/31/2013 Imaging CT chest- Stable postoperative and post therapy change in the left hemi- thorax. No evidence of lung cancer recurrence.   02/12/2014 Imaging Ct chest- The previously seen pulmonary nodules are stable. However, there appears to be new ground-glass pulmonary nodules in the right upper lobe. These are small and nonspecific. Cannot exclude metastases.   05/26/2014 Imaging CT chest- Right lung nodules are grossly stable from 02/12/2014 but appear new/enlarged from 07/16/2012, raising concern for bronchogenic carcinoma. Pulmonary parenchymal pattern of fibrosis does not appear progressive from 07/16/2012   06/25/2014 - 11/30/2014 Chemotherapy Tarceva 25 mg daily started by Dr. Barnet Glasgow (Loc[facility] Tenen)   10/16/2014 Imaging CT chest- Stable nodules in right lung. The largest nodule in right lower lobe measures 1.6 x 1.5 cm stable from prior  exam. No definite new pulmonary nodules are noted.   12/02/2014 Treatment Plan Change Inappropriate use of Tarceva without biopsy proven disease recurrence and inappropriately low dose.   01/14/2015 Imaging CT chest, Continued slow growth of multiple ground-glass and sub solid right lung nodules compared with older prior studies. Findings remain suspicious for metastatic disease or development of multicentric adenocarcinoma.   02/13/2015 -  Chemotherapy Iressa 250 mg daily.    I personally reviewed and went over laboratory results with the patient.  The results are noted within this dictation.  Patient is tolerating therapy well without any complaints.  She denies any bowel changes, nausea, vomiting, and rash.  She reports that the Temazepam is very effective for her insomnia.  Unfortunately, her insurance company informed us that they have provided the patient a 1 month supply and will not cover the medication after that.  A letter was sent on the patient's behalf for reconsideration of this denial, but insurance company denied again without an additional ability to rebut.    Oncologically, she denies any complaints and ROS questioning is negative.  Past Medical History  Diagnosis Date  . CVA (cerebral infarction)     speech difficulities  . Stroke   . Hypercholesteremia     Takes Zocor daily  . Hypertension     takes Atenolol and Lisinopril daily  . Lung mass     left upper lobe  . Arthritis     hands  . TIA (transient ischemic attack)     Sat before thanksgiving;impaired speech  . Pneumothorax   . Lung cancer     has CVA (cerebral infarction); HTN (hypertension); Pneumothorax of left lung after biopsy; Adenocarcinoma of left lung, stage 1; Dysarthria as late effect of stroke; Bilateral  carotid artery disease; Other and unspecified hyperlipidemia; Lack of coordination; Muscle weakness (generalized); Hyperlipidemia; Bronchitis; Diastolic CHF, acute; Dyspnea; Acute on chronic renal  failure; Cough; and Anemia on her problem list.     has No Known Allergies.  [name] does not currently have medications on file.  Past Surgical History  Procedure Laterality Date  . No past surgeries    . Lung biopsy  10/13/11    left  . Abdominal hysterectomy  1975  . Varicose vein surgery  32yr ago  . Video assisted thoracoscopy  11/18/2011    Procedure: VIDEO ASSISTED THORACOSCOPY;  Surgeon: SMelrose Nakayama MD;  Location[facility]  Service: Thoracic;  Laterality: Left;  . Lobectomy  11/18/2011    Procedure: LOBECTOMY;  Surgeon: SMelrose Nakayama MD;  [place] [facility]  Service: Thoracic;  Laterality: Left;  LEFT UPPER LOBECTOMY  . Breast biopsy  early 2000    Denies any headaches, dizziness, double vision, fevers, chills, night sweats, nausea, vomiting, diarrhea, constipation, chest pain, heart palpitations, shortness of breath, blood in stool, black tarry stool, urinary pain, urinary burning, urinary frequency, hematuria.   PHYSICAL EXAMINATION  ECOG PERFORMANCE STATUS: 2 - Symptomatic, <50% confined to bed  Filed Vitals:   02/18/15 1500  BP: 107/56  Pulse: 73  Temp: 97.7 F (36.5 C)  Resp: 14    GENERAL:alert, no distress, well nourished, well developed, comfortable, cooperative and smiling SKIN: skin color, texture, turgor are normal, no rashes or significant lesions HEAD: Normocephalic, No masses, lesions, tenderness or abnormalities EYES: normal, PERRLA, EOMI, Conjunctiva are pink and non-injected EARS: External ears normal OROPHARYNX:lips, buccal mucosa, and tongue normal and mucous membranes are moist  NECK: supple, no adenopathy, thyroid normal size, non-tender, without nodularity, trachea midline LYMPH:  no palpable lymphadenopathy BREAST:not examined LUNGS: clear to auscultation  HEART: regular rate & rhythm, no murmurs and no gallops ABDOMEN:abdomen soft and normal bowel sounds BACK: Back symmetric, no curvature. EXTREMITIES:less then 2 second  capillary refill, no joint deformities, effusion, or inflammation, no skin discoloration, no clubbing  NEURO: alert & oriented x 3 with fluent speech, no focal motor/sensory deficits, gait normal   LABORATORY DATA: CBC    Component Value Date/Time   WBC 4.8 02/18/2015 1645   RBC 4.04 02/18/2015 1645   HGB 13.2 02/18/2015 1645   HCT 39.6 02/18/2015 1645   PLT 162 02/18/2015 1645   MCV 98.0 02/18/2015 1645   MCH 32.7 02/18/2015 1645   MCHC 33.3 02/18/2015 1645   RDW 12.3 02/18/2015 1645   LYMPHSABS 1.1 02/18/2015 1645   MONOABS 0.5 02/18/2015 1645   EOSABS 0.2 02/18/2015 1645   BASOSABS 0.0 02/18/2015 1645      Chemistry      Component Value Date/Time   NA 138 12/18/2014 0700   K 4.6 12/18/2014 0700   CL 106 12/18/2014 0700   CO2 29 12/18/2014 0700   BUN 23 12/18/2014 0700   CREATININE 1.39* 12/18/2014 0700      Component Value Date/Time   CALCIUM 9.0 12/18/2014 0700   ALKPHOS 83 12/02/2014 1412   AST 23 12/02/2014 1412   ALT 13 12/02/2014 1412   BILITOT 0.5 12/02/2014 1412        ASSESSMENT AND PLAN:  Adenocarcinoma of left lung, stage 1 questionable progressive adenocarcinoma of the lung, not biopsy proven.  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  Patient refuses biopsy.  Currently on Iressa 250 mg daily.  Tolerating nicely.  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  She will update [place] on effectiveness.   Labs in 2 weeks: CBC diff, CMET.   Return in 2 weeks for follow-up.    THERAPY PLAN:  Continue Iressa 250 mg daily.   All questions were answered. The patient knows to call the clinic with any problems, questions or concerns. We can certainly see the patient much sooner if necessary.  Patient and plan discussed with Dr. Ancil Linsey and she is in agreement with the aforementioned.   This note is electronically signed by: Robynn Pane 02/18/2015 5:45 PM

## 2015-02-18 NOTE — Progress Notes (Signed)
Stephanie [name] presented for labwork. Labs per MD order drawn via Peripheral Line 23 gauge needle inserted in left AC  Good blood return present. Procedure without incident.  Needle removed intact. Patient tolerated procedure well.   

## 2015-03-06 ENCOUNTER — Encounter (HOSPITAL_COMMUNITY): Payer: Self-pay | Admitting: Oncology

## 2015-03-06 ENCOUNTER — Encounter (HOSPITAL_COMMUNITY): Payer: Medicare Other

## 2015-03-06 ENCOUNTER — Non-Acute Institutional Stay (SKILLED_NURSING_FACILITY): Payer: Medicare Other | Admitting: Internal Medicine

## 2015-03-06 ENCOUNTER — Encounter (HOSPITAL_COMMUNITY): Payer: Medicare Other | Attending: Hematology and Oncology | Admitting: Oncology

## 2015-03-06 ENCOUNTER — Other Ambulatory Visit (HOSPITAL_COMMUNITY)
Admission: AD | Admit: 2015-03-06 | Discharge: 2015-03-06 | Disposition: A | Discharge status: Home / Self Care | Payer: Medicare Other | Source: Skilled Nursing Facility | Attending: Internal Medicine | Admitting: Internal Medicine

## 2015-03-06 ENCOUNTER — Ambulatory Visit (HOSPITAL_COMMUNITY): Payer: Medicare Other | Admitting: Oncology

## 2015-03-06 ENCOUNTER — Encounter: Payer: Self-pay | Admitting: Internal Medicine

## 2015-03-06 ENCOUNTER — Other Ambulatory Visit (HOSPITAL_COMMUNITY): Payer: Medicare Other

## 2015-03-06 VITALS — BP 128/60 | HR 60 | Temp 97.5°F | Resp 16

## 2015-03-06 DIAGNOSIS — R69 Illness, unspecified: Secondary | ICD-10-CM | POA: Diagnosis present

## 2015-03-06 DIAGNOSIS — C801 Malignant (primary) neoplasm, unspecified: Secondary | ICD-10-CM | POA: Diagnosis not present

## 2015-03-06 DIAGNOSIS — I639 Cerebral infarction, unspecified: Secondary | ICD-10-CM

## 2015-03-06 DIAGNOSIS — C3412 Malignant neoplasm of upper lobe, left bronchus or lung: Secondary | ICD-10-CM | POA: Diagnosis not present

## 2015-03-06 DIAGNOSIS — C3492 Malignant neoplasm of unspecified part of left bronchus or lung: Secondary | ICD-10-CM

## 2015-03-06 DIAGNOSIS — I10 Essential (primary) hypertension: Secondary | ICD-10-CM

## 2015-03-06 DIAGNOSIS — I5030 Unspecified diastolic (congestive) heart failure: Secondary | ICD-10-CM | POA: Diagnosis not present

## 2015-03-06 LAB — CBC WITH DIFFERENTIAL/PLATELET
BASOS ABS: 0 10*3/uL (ref 0.0–0.1)
BASOS ABS: 0 10*3/uL (ref 0.0–0.1)
BASOS PCT: 0 % (ref 0–1)
BASOS PCT: 0 % (ref 0–1)
EOS ABS: 0.3 10*3/uL (ref 0.0–0.7)
Eosinophils Absolute: 0.3 10*3/uL (ref 0.0–0.7)
Eosinophils Relative: 5 % (ref 0–5)
Eosinophils Relative: 5 % (ref 0–5)
HCT: 39 % (ref 36.0–46.0)
HEMATOCRIT: 37.7 % (ref 36.0–46.0)
HEMOGLOBIN: 12.5 g/dL (ref 12.0–15.0)
Hemoglobin: 12.8 g/dL (ref 12.0–15.0)
Lymphocytes Relative: 18 % (ref 12–46)
Lymphocytes Relative: 18 % (ref 12–46)
Lymphs Abs: 1 10*3/uL (ref 0.7–4.0)
Lymphs Abs: 1.1 10*3/uL (ref 0.7–4.0)
MCH: 32.5 pg (ref 26.0–34.0)
MCH: 32.4 pg (ref 26.0–34.0)
MCHC: 33.2 g/dL (ref 30.0–36.0)
MCHC: 32.8 g/dL (ref 30.0–36.0)
MCV: 97.9 fL (ref 78.0–100.0)
MCV: 98.7 fL (ref 78.0–100.0)
MONO ABS: 0.6 10*3/uL (ref 0.1–1.0)
MONOS PCT: 12 % (ref 3–12)
MONOS PCT: 13 % — AB (ref 3–12)
Monocytes Absolute: 0.8 10*3/uL (ref 0.1–1.0)
NEUTROS ABS: 3.5 10*3/uL (ref 1.7–7.7)
NEUTROS PCT: 64 % (ref 43–77)
Neutro Abs: 4.1 10*3/uL (ref 1.7–7.7)
Neutrophils Relative %: 65 % (ref 43–77)
Platelets: 150 10*3/uL (ref 150–400)
Platelets: 183 10*3/uL (ref 150–400)
RBC: 3.85 MIL/uL — ABNORMAL LOW (ref 3.87–5.11)
RBC: 3.95 MIL/uL (ref 3.87–5.11)
RDW: 12.3 % (ref 11.5–15.5)
RDW: 12.3 % (ref 11.5–15.5)
WBC: 5.4 10*3/uL (ref 4.0–10.5)
WBC: 6.3 10*3/uL (ref 4.0–10.5)

## 2015-03-06 LAB — COMPREHENSIVE METABOLIC PANEL
ALBUMIN: 3.5 g/dL (ref 3.5–5.0)
ALT: 14 U/L (ref 14–54)
ALT: 14 U/L (ref 14–54)
ANION GAP: 5 (ref 5–15)
AST: 24 U/L (ref 15–41)
AST: 24 U/L (ref 15–41)
Albumin: 3.7 g/dL (ref 3.5–5.0)
Alkaline Phosphatase: 83 U/L (ref 38–126)
Alkaline Phosphatase: 91 U/L (ref 38–126)
Anion gap: 8 (ref 5–15)
BUN: 22 mg/dL — ABNORMAL HIGH (ref 6–20)
BUN: 22 mg/dL — ABNORMAL HIGH (ref 6–20)
CHLORIDE: 107 mmol/L (ref 101–111)
CO2: 27 mmol/L (ref 22–32)
CO2: 26 mmol/L (ref 22–32)
CREATININE: 1.67 mg/dL — AB (ref 0.44–1.00)
Calcium: 9 mg/dL (ref 8.9–10.3)
Calcium: 8.9 mg/dL (ref 8.9–10.3)
Chloride: 104 mmol/L (ref 101–111)
Creatinine, Ser: 1.55 mg/dL — ABNORMAL HIGH (ref 0.44–1.00)
GFR calc Af Amer: 37 mL/min — ABNORMAL LOW (ref >60)
GFR calc non Af Amer: 32 mL/min — ABNORMAL LOW (ref >60)
GFR, EST AFRICAN AMERICAN: 34 mL/min — AB (ref >60)
GFR, EST NON AFRICAN AMERICAN: 29 mL/min — AB (ref >60)
GLUCOSE: 93 mg/dL (ref 70–99)
Glucose, Bld: 101 mg/dL — ABNORMAL HIGH (ref 70–99)
POTASSIUM: 4.6 mmol/L (ref 3.5–5.1)
POTASSIUM: 3.9 mmol/L (ref 3.5–5.1)
SODIUM: 138 mmol/L (ref 135–145)
Sodium: 139 mmol/L (ref 135–145)
TOTAL PROTEIN: 7.2 g/dL (ref 6.5–8.1)
Total Bilirubin: 0.6 mg/dL (ref 0.3–1.2)
Total Bilirubin: 0.5 mg/dL (ref 0.3–1.2)
Total Protein: 7.5 g/dL (ref 6.5–8.1)

## 2015-03-06 NOTE — Assessment & Plan Note (Signed)
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  She is tolerating Iressa well without any known side effects or toxicities.    Labs in 3 weeks: CBC diff, CMET  Return in 3 weeks for follow-up.

## 2015-03-06 NOTE — Progress Notes (Signed)
[name], MD [address] [place] [address]  Adenocarcinoma of left lung, stage 1  CURRENT THERAPY: Iressa 250 mg daily beginning on 02/12/2014.  INTERVAL HISTORY: Stephanie [name] 74 y.o. female returns for followup of questionable progressive adenocarcinoma of the lung, not biopsy proven. 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    Adenocarcinoma of left lung, stage 1   10/19/2011 Imaging PET/CT showing activity in LUL lesion, no mediastinal metastasis. no evidence of distant mestastasis   10/19/2011 Initial Diagnosis Adenocarcinoma with Exon 19 deletion, ALK-   11/08/2011 Surgery Thoracoscopic left upper lobectomy with mediastinal exploration, T2aN0 adenocarcinoma with bronchoalveolar  characteristics,  4.3cm, exon 19 deletion, ALK negative, Dr. Roxan Hockey   07/31/2013 Imaging CT chest- Stable postoperative and post therapy change in the left hemi- thorax. No evidence of lung cancer recurrence.   02/12/2014 Imaging Ct chest- The previously seen pulmonary nodules are stable. However, there appears to be new ground-glass pulmonary nodules in the right upper lobe. These are small and nonspecific. Cannot exclude metastases.   05/26/2014 Imaging CT chest- Right lung nodules are grossly stable from 02/12/2014 but appear new/enlarged from 07/16/2012, raising concern for bronchogenic carcinoma. Pulmonary parenchymal pattern of fibrosis does not appear progressive from 07/16/2012   06/25/2014 - 11/30/2014 Chemotherapy Tarceva 25 mg daily started by Dr. Barnet Glasgow ([facility]en)   10/16/2014 Imaging CT chest- Stable nodules in right lung. The largest nodule in right lower lobe measures 1.6 x 1.5 cm stable from prior exam. No definite new pulmonary nodules are noted.   12/02/2014 Treatment Plan Change Inappropriate use of Tarceva without biopsy proven disease recurrence and inappropriately low dose.   01/14/2015 Imaging CT  chest, Continued slow growth of multiple ground-glass and sub solid right lung nodules compared with older prior studies. Findings remain suspicious for metastatic disease or development of multicentric adenocarcinoma.   02/13/2015 -  Chemotherapy Iressa 250 mg daily.     I personally reviewed and went over laboratory results with the patient.  The results are noted within this dictation.  She admits she is tolerating therapy well thus far.  She denies any side effects that I reviewed with her.  Oncologically, she denies any complaints and ROS questioning is negative.  Past Medical History  Diagnosis Date  . CVA (cerebral infarction)     speech difficulities  . Stroke   . Hypercholesteremia     Takes Zocor daily  . Hypertension     takes Atenolol and Lisinopril daily  . Lung mass     left upper lobe  . Arthritis     hands  . TIA (transient ischemic attack)     Sat before thanksgiving;impaired speech  . Pneumothorax   . Lung cancer     has CVA (cerebral infarction); HTN (hypertension); Pneumothorax of left lung after biopsy; Adenocarcinoma of left lung, stage 1; Dysarthria as late effect of stroke; Bilateral carotid artery disease; Other and unspecified hyperlipidemia; Lack of coordination; Muscle weakness (generalized); Hyperlipidemia; Bronchitis; Diastolic CHF, acute; Dyspnea; Acute on chronic renal failure; Cough; and Anemia on her problem list.     has No Known Allergies.  Stephanie [name] does not currently have medications on file.  Past Surgical History  Procedure Laterality Date  . No past surgeries    . Lung biopsy  10/13/11    left  . Abdominal hysterectomy  1975  . Varicose vein surgery  12yr ago  .  Video assisted thoracoscopy  11/18/2011    Procedure: VIDEO ASSISTED THORACOSCOPY;  Surgeon: Melrose Nakayama, MD;  Location: [facility];  Service: Thoracic;  Laterality: Left;  . Lobectomy  11/18/2011    Procedure: LOBECTOMY;  Surgeon: Melrose Nakayama, MD;  Location: [facility];  Service: Thoracic;  Laterality: Left;  LEFT UPPER LOBECTOMY  . Breast biopsy  early 2000    Denies any headaches, dizziness, double vision, fevers, chills, night sweats, nausea, vomiting, diarrhea, constipation, chest pain, heart palpitations, shortness of breath, blood in stool, black tarry stool, urinary pain, urinary burning, urinary frequency, hematuria.   PHYSICAL EXAMINATION  ECOG PERFORMANCE STATUS: 1 - Symptomatic but completely ambulatory  Filed Vitals:   03/06/15 1532  BP: 128/60  Pulse: 60  Temp: 97.5 F (36.4 C)  Resp: 16    GENERAL:alert, no distress, well nourished, well developed, comfortable, cooperative, smiling and accompanied by her son. SKIN: skin color, texture, turgor are normal, no rashes or significant lesions HEAD: Normocephalic, No masses, lesions, tenderness or abnormalities EYES: normal, PERRLA, EOMI, Conjunctiva are pink and non-injected EARS: External ears normal OROPHARYNX:lips, buccal mucosa, and tongue normal and mucous membranes are moist  NECK: supple, no adenopathy, thyroid normal size, non-tender, without nodularity, no stridor, non-tender, trachea midline LYMPH:  no palpable lymphadenopathy BREAST:not examined LUNGS: clear to auscultation and percussion HEART: regular rate & rhythm, no murmurs, no gallops, S1 normal and S2 normal ABDOMEN:abdomen soft, non-tender and normal bowel sounds BACK: Back symmetric, no curvature. EXTREMITIES:less then 2 second capillary refill, no joint deformities, effusion, or inflammation, no skin discoloration, no cyanosis  NEURO: alert & oriented x 3 with fluent speech, no focal motor/sensory deficits, in a wheelchair    LABORATORY DATA: CBC    Component Value Date/Time   WBC 5.4 03/06/2015 0735   RBC 3.85* 03/06/2015 0735   HGB 12.5 03/06/2015 0735   HCT 37.7 03/06/2015 0735   PLT 150 03/06/2015 0735   MCV 97.9 03/06/2015 0735   MCH 32.5 03/06/2015 0735   MCHC 33.2 03/06/2015 0735   RDW 12.3  03/06/2015 0735   LYMPHSABS 1.0 03/06/2015 0735   MONOABS 0.6 03/06/2015 0735   EOSABS 0.3 03/06/2015 0735   BASOSABS 0.0 03/06/2015 0735      Chemistry      Component Value Date/Time   NA 139 03/06/2015 0735   K 4.6 03/06/2015 0735   CL 107 03/06/2015 0735   CO2 27 03/06/2015 0735   BUN 22* 03/06/2015 0735   CREATININE 1.55* 03/06/2015 0735      Component Value Date/Time   CALCIUM 9.0 03/06/2015 0735   ALKPHOS 83 03/06/2015 0735   AST 24 03/06/2015 0735   ALT 14 03/06/2015 0735   BILITOT 0.6 03/06/2015 0735        ASSESSMENT AND PLAN:  Adenocarcinoma of left lung, stage 1 Questionable progressive adenocarcinoma of the lung, not biopsy proven. 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  She is tolerating Iressa well without any known side effects or toxicities.    Labs in 3 weeks: CBC diff, CMET  Return in 3 weeks for follow-up.    THERAPY PLAN:  Continue Iressa daily as prescribed (250 mg).  We will monitor for toxicities.  All questions were answered. The patient knows to call the clinic with any problems, questions or concerns. We can certainly see the patient much sooner if necessary.  Patient and plan discussed with Dr. Ancil Linsey and she is in agreement  with the aforementioned.   This note is electronically signed by: Robynn Pane 03/06/2015 4:27 PM

## 2015-03-06 NOTE — Patient Instructions (Signed)
..  [place] at [facility] Discharge Instructions  RECOMMENDATIONS MADE BY THE CONSULTANT AND ANY TEST RESULTS WILL BE SENT TO YOUR REFERRING PHYSICIAN.  Exam and discussion with Robynn Pane, PA-C. Will call with today's lab results once they are back. Return again for labs in 3 weeks and follow up appointment. Call for any new or worsening symptoms, questions, or concerns.   Thank you for choosing [place] at [facility] to provide your oncology and hematology care.  To afford each patient quality time with our provider, please arrive at least 15 minutes before your scheduled appointment time.    You need to re-schedule your appointment should you arrive 10 or more minutes late.  We strive to give you quality time with our providers, and arriving late affects you and other patients whose appointments are after yours.  Also, if you no show three or more times for appointments you may be dismissed from the clinic at the providers discretion.     Again, thank you for choosing [facility].  Our hope is that these requests will decrease the amount of time that you wait before being seen by our physicians.       _____________________________________________________________  Should you have questions after your visit to [facility], please contact our office at [phone] between the hours of 8:30 a.m. and 4:30 p.m.  Voicemails left after 4:30 p.m. will not be returned until the following business day.  For prescription refill requests, have your pharmacy contact our office.

## 2015-03-06 NOTE — Progress Notes (Signed)
Patient ID: Stephanie [name], female   DOB: 12-10-1940, 74 y.o.   MRN: [id number]      this is a routine visit.  Level care skilled.  Facility [facility].   CHIEF COMPLAINT: Medical management of CVA late effect-hypertension-depression-CHF-acute visit secondary to nasal congestion.    HISTORY OF PRESENT ILLNESS:  This is a patient who came to [place] on 10/19/2014.  She had been on [facility] with shortness of breath.    She has a history of metastatic adeno-CA of the lung-- was on Tarceva.--But this has been discontinued per oncology-she is now on Iressa and per oncology appears to be tolerating this well she actually saw oncology earlier today    Her major disability is a late-effect dominant hemisphere CVA, which I think occurred some two years before this.    She also has diastolic heart failure.    I She appears to be quite stable has gained strength during her stay here.  She is on Lasix every other day 20 mg for a history of CHF her weight appears to be relatively stable .  She also has a history of hypertension on atenolol and Norvasc this appears stable most recent blood pressures 118/51-120/54-141/66  She does have a history of CVA with some right-sided weakness and dysarthria-she is on aspirin for anticoagulation-again she appears to be somewhat stronger progressively-she does ambulate about facility in the wheelchair.      Family medical social history reviewed per oncology note 12/02/2014 as well as admission note 10/19/2014.  Medications have been reviewed per [facility]  Review of systems-somewhat limited secondary dysarthria   In general no complaints of fever or chills.  Respiratory does not complain of cough or shortness of breath.  Head ears eyes nose mouth and throat only complaint is nasal stuffiness.  Respiratory no complaints of cough or shortness of breath.  Cardiac no chest pain minimal lower extremity edema.  GI does not complain of any nausea vomiting  diarrhea constipation or abdominal discomfort.  Muscle skeletal does not complain of joint pain per patient and nursing staff.  Neurologic is not complaining of dizziness or headache does have right-sided weakness with history of CVA   PHYSICAL EXAMINATION  She is afebrile pulse of 80 respirations 18 blood pressure 118/51 weight is 153.6 this appears to be stable  In general this is a pleasant elderly female sitting comfortably in her wheelchair  Her skin is warm and dry.  Eyes sclera and conjunctiva are clear visual acuity appears grossly intact.  Nose  did not appreciate any active drainage.  Oropharynx is clear mucous membranes moist.  :   CHEST/RESPIRATORY:  Shallow, but otherwise clear air entry bilaterally.   CARDIOVASCULAR:  CARDIAC:   Heart sounds are normal.  She appears to be euvolemic rate and rhythm there is minimal lower extremity edema.--She has baseline varicosities   Abdomen is soft nontender with positive bowel sounds  NEUROLOGICAL:    CRANIAL NERVES:  She has a cortically-based language problem, but can converse.   SENSATION/STRENGTH:  She has right upper extremity greater than right lower extremity weakness,   Labs.  03/06/2015.  Sodium 139 potassium 4.6 BUN 22 creatinine 1.55.  Liver function tests within normal limits.  WBC 5.4 hemoglobin 12.5 platelets 150.  02/18/2015.  Sodium 138 potassium 4.2 BUN 32 creatinine 1.78  12/03/2014.  Sodium 138 potassium 4.4 BUN 19 creatinine 1.39.  11/19/2014.  WBC 6.3 hemoglobin 13.9 platelets 216.      ASSESSMENT/PLAN:  Late-effect dominant hemisphere CVA.  Probably this is fairly distant she appears to have gained some strength she is on aspirin for anticoagulation    Diastolic heart failure.  At this point this appears stable on low-dose Lasix --appears to have some element of renal insufficiency with baseline creatinine appearing to run in the mid 1's area we'll update  this in a couple weeks to ensure stability.    History of adeno-CA of the lung. She is followed by oncology per consultation with family  discontinued the Tarceva. She is now on Iressa and appears to be tolerating this well  .  Anemia.  She has  Had  an OB-positive stool, but hemoglobin shows stability  History of renal insufficiency this appears to be  Fairly  stable will update metabolic panel as noted above.    History of hypertension this appears stable current medications as noted above.    Depression this actually appears to be stable on Celexa.                                                                                        [id number]

## 2015-03-16 ENCOUNTER — Non-Acute Institutional Stay (SKILLED_NURSING_FACILITY): Payer: Medicare Other | Admitting: Internal Medicine

## 2015-03-16 ENCOUNTER — Observation Stay (HOSPITAL_COMMUNITY)
Admission: EM | Admit: 2015-03-16 | Discharge: 2015-03-17 | Disposition: A | Discharge status: Home / Self Care | Payer: Medicare Other | Attending: Internal Medicine | Admitting: Internal Medicine

## 2015-03-16 ENCOUNTER — Encounter (HOSPITAL_COMMUNITY): Payer: Self-pay | Admitting: Emergency Medicine

## 2015-03-16 ENCOUNTER — Emergency Department (HOSPITAL_COMMUNITY): Payer: Medicare Other

## 2015-03-16 DIAGNOSIS — Z7982 Long term (current) use of aspirin: Secondary | ICD-10-CM | POA: Insufficient documentation

## 2015-03-16 DIAGNOSIS — C3492 Malignant neoplasm of unspecified part of left bronchus or lung: Secondary | ICD-10-CM | POA: Diagnosis present

## 2015-03-16 DIAGNOSIS — I129 Hypertensive chronic kidney disease with stage 1 through stage 4 chronic kidney disease, or unspecified chronic kidney disease: Secondary | ICD-10-CM | POA: Diagnosis not present

## 2015-03-16 DIAGNOSIS — I959 Hypotension, unspecified: Secondary | ICD-10-CM | POA: Insufficient documentation

## 2015-03-16 DIAGNOSIS — J939 Pneumothorax, unspecified: Secondary | ICD-10-CM | POA: Insufficient documentation

## 2015-03-16 DIAGNOSIS — Z79899 Other long term (current) drug therapy: Secondary | ICD-10-CM | POA: Diagnosis not present

## 2015-03-16 DIAGNOSIS — N189 Chronic kidney disease, unspecified: Secondary | ICD-10-CM

## 2015-03-16 DIAGNOSIS — G9340 Encephalopathy, unspecified: Secondary | ICD-10-CM | POA: Diagnosis present

## 2015-03-16 DIAGNOSIS — Z8673 Personal history of transient ischemic attack (TIA), and cerebral infarction without residual deficits: Secondary | ICD-10-CM | POA: Diagnosis not present

## 2015-03-16 DIAGNOSIS — E780 Pure hypercholesterolemia: Secondary | ICD-10-CM | POA: Insufficient documentation

## 2015-03-16 DIAGNOSIS — R4182 Altered mental status, unspecified: Secondary | ICD-10-CM | POA: Diagnosis not present

## 2015-03-16 DIAGNOSIS — N183 Chronic kidney disease, stage 3 (moderate): Secondary | ICD-10-CM | POA: Diagnosis not present

## 2015-03-16 DIAGNOSIS — N179 Acute kidney failure, unspecified: Secondary | ICD-10-CM | POA: Diagnosis present

## 2015-03-16 DIAGNOSIS — Z85118 Personal history of other malignant neoplasm of bronchus and lung: Secondary | ICD-10-CM | POA: Diagnosis not present

## 2015-03-16 DIAGNOSIS — I739 Peripheral vascular disease, unspecified: Secondary | ICD-10-CM

## 2015-03-16 DIAGNOSIS — I10 Essential (primary) hypertension: Secondary | ICD-10-CM | POA: Diagnosis present

## 2015-03-16 DIAGNOSIS — I69322 Dysarthria following cerebral infarction: Secondary | ICD-10-CM

## 2015-03-16 DIAGNOSIS — R410 Disorientation, unspecified: Secondary | ICD-10-CM | POA: Diagnosis not present

## 2015-03-16 DIAGNOSIS — M1990 Unspecified osteoarthritis, unspecified site: Secondary | ICD-10-CM | POA: Diagnosis not present

## 2015-03-16 DIAGNOSIS — I779 Disorder of arteries and arterioles, unspecified: Secondary | ICD-10-CM

## 2015-03-16 HISTORY — DX: Chronic kidney disease, stage 3 unspecified: N18.30

## 2015-03-16 HISTORY — DX: Chronic kidney disease, stage 3 (moderate): N18.3

## 2015-03-16 LAB — I-STAT CHEM 8, ED
BUN: 28 mg/dL — ABNORMAL HIGH (ref 6–20)
CALCIUM ION: 1.2 mmol/L (ref 1.13–1.30)
Chloride: 103 mmol/L (ref 101–111)
Creatinine, Ser: 1.7 mg/dL — ABNORMAL HIGH (ref 0.44–1.00)
GLUCOSE: 103 mg/dL — AB (ref 65–99)
HEMATOCRIT: 40 % (ref 36.0–46.0)
Hemoglobin: 13.6 g/dL (ref 12.0–15.0)
Potassium: 3.8 mmol/L (ref 3.5–5.1)
Sodium: 141 mmol/L (ref 135–145)
TCO2: 23 mmol/L (ref 0–100)

## 2015-03-16 LAB — I-STAT CG4 LACTIC ACID, ED
Lactic Acid, Venous: 1.8 mmol/L (ref 0.5–2.0)
Lactic Acid, Venous: 1.06 mmol/L (ref 0.5–2.0)

## 2015-03-16 LAB — ETHANOL

## 2015-03-16 LAB — CBC
HEMATOCRIT: 38.3 % (ref 36.0–46.0)
HEMOGLOBIN: 12.7 g/dL (ref 12.0–15.0)
MCH: 32.4 pg (ref 26.0–34.0)
MCHC: 33.2 g/dL (ref 30.0–36.0)
MCV: 97.7 fL (ref 78.0–100.0)
Platelets: 158 10*3/uL (ref 150–400)
RBC: 3.92 MIL/uL (ref 3.87–5.11)
RDW: 12.3 % (ref 11.5–15.5)
WBC: 4.7 10*3/uL (ref 4.0–10.5)

## 2015-03-16 LAB — I-STAT TROPONIN, ED: Troponin i, poc: 0 ng/mL (ref 0.00–0.08)

## 2015-03-16 LAB — DIFFERENTIAL
Basophils Absolute: 0 10*3/uL (ref 0.0–0.1)
Basophils Relative: 0 % (ref 0–1)
EOS PCT: 6 % — AB (ref 0–5)
Eosinophils Absolute: 0.3 10*3/uL (ref 0.0–0.7)
LYMPHS PCT: 19 % (ref 12–46)
Lymphs Abs: 0.9 10*3/uL (ref 0.7–4.0)
MONOS PCT: 11 % (ref 3–12)
Monocytes Absolute: 0.5 10*3/uL (ref 0.1–1.0)
NEUTROS ABS: 3 10*3/uL (ref 1.7–7.7)
Neutrophils Relative %: 64 % (ref 43–77)

## 2015-03-16 LAB — COMPREHENSIVE METABOLIC PANEL
ALT: 10 U/L — ABNORMAL LOW (ref 14–54)
ANION GAP: 6 (ref 5–15)
AST: 21 U/L (ref 15–41)
Albumin: 3.6 g/dL (ref 3.5–5.0)
Alkaline Phosphatase: 87 U/L (ref 38–126)
BUN: 28 mg/dL — AB (ref 6–20)
CALCIUM: 9.2 mg/dL (ref 8.9–10.3)
CO2: 29 mmol/L (ref 22–32)
Chloride: 105 mmol/L (ref 101–111)
Creatinine, Ser: 1.75 mg/dL — ABNORMAL HIGH (ref 0.44–1.00)
GFR calc Af Amer: 32 mL/min — ABNORMAL LOW (ref >60)
GFR calc non Af Amer: 28 mL/min — ABNORMAL LOW (ref >60)
Glucose, Bld: 108 mg/dL — ABNORMAL HIGH (ref 65–99)
Potassium: 4 mmol/L (ref 3.5–5.1)
Sodium: 140 mmol/L (ref 135–145)
Total Bilirubin: 0.6 mg/dL (ref 0.3–1.2)
Total Protein: 7.2 g/dL (ref 6.5–8.1)

## 2015-03-16 LAB — TSH: TSH: 1.848 u[IU]/mL (ref 0.350–4.500)

## 2015-03-16 LAB — APTT: APTT: 34 s (ref 24–37)

## 2015-03-16 LAB — PROTIME-INR
INR: 1.11 (ref 0.00–1.49)
Prothrombin Time: 14.4 seconds (ref 11.6–15.2)

## 2015-03-16 MED ORDER — ALUM & MAG HYDROXIDE-SIMETH 200-200-20 MG/5ML PO SUSP: 30.0000 mL | Freq: Four times a day (QID) | ORAL | Status: DC | PRN

## 2015-03-16 MED ORDER — ONDANSETRON HCL 4 MG/2ML IJ SOLN: 4.0000 mg | Freq: Four times a day (QID) | INTRAMUSCULAR | Status: DC | PRN

## 2015-03-16 MED ORDER — CITALOPRAM HYDROBROMIDE 20 MG PO TABS
40.0000 mg | ORAL_TABLET | Freq: Every day | ORAL | Status: DC
  Administered 2015-03-17: 40 mg via ORAL
  Filled 2015-03-16: qty 2

## 2015-03-16 MED ORDER — GEFITINIB 250 MG PO TABS: 250.0000 mg | ORAL_TABLET | Freq: Every day | ORAL | Status: DC

## 2015-03-16 MED ORDER — ENOXAPARIN SODIUM 30 MG/0.3ML ~~LOC~~ SOLN
30.0000 mg | SUBCUTANEOUS | Status: DC
  Administered 2015-03-16: 30 mg via SUBCUTANEOUS

## 2015-03-16 MED ORDER — ONDANSETRON HCL 4 MG PO TABS: 4.0000 mg | ORAL_TABLET | Freq: Four times a day (QID) | ORAL | Status: DC | PRN

## 2015-03-16 MED ORDER — ACETAMINOPHEN 650 MG RE SUPP: 650.0000 mg | Freq: Four times a day (QID) | RECTAL | Status: DC | PRN

## 2015-03-16 MED ORDER — SODIUM CHLORIDE 0.9 % IV SOLN
INTRAVENOUS | Status: AC
  Administered 2015-03-16: 17:00:00 via INTRAVENOUS

## 2015-03-16 MED ORDER — SODIUM CHLORIDE 0.9 % IV BOLUS (SEPSIS)
1000.0000 mL | Freq: Once | INTRAVENOUS | Status: AC
  Administered 2015-03-16: 1000 mL via INTRAVENOUS

## 2015-03-16 MED ORDER — ACETAMINOPHEN 325 MG PO TABS: 650.0000 mg | ORAL_TABLET | Freq: Four times a day (QID) | ORAL | Status: DC | PRN

## 2015-03-16 MED ORDER — ASPIRIN EC 81 MG PO TBEC
81.0000 mg | DELAYED_RELEASE_TABLET | Freq: Every morning | ORAL | Status: DC
  Administered 2015-03-17: 81 mg via ORAL
  Filled 2015-03-16: qty 1

## 2015-03-16 MED ORDER — BISACODYL 10 MG RE SUPP: 10.0000 mg | Freq: Every day | RECTAL | Status: DC | PRN

## 2015-03-16 MED ORDER — MAGNESIUM CITRATE PO SOLN: 1.0000 | Freq: Once | ORAL | Status: AC | PRN

## 2015-03-16 MED ORDER — SODIUM CHLORIDE 0.9 % IJ SOLN
3.0000 mL | Freq: Two times a day (BID) | INTRAMUSCULAR | Status: DC
  Administered 2015-03-17: 3 mL via INTRAVENOUS

## 2015-03-16 MED ORDER — TRAZODONE HCL 50 MG PO TABS: 25.0000 mg | ORAL_TABLET | Freq: Every evening | ORAL | Status: DC | PRN

## 2015-03-16 MED ORDER — GUAIFENESIN-DM 100-10 MG/5ML PO SYRP: 5.0000 mL | ORAL_SOLUTION | ORAL | Status: DC | PRN

## 2015-03-16 MED ORDER — SIMVASTATIN 20 MG PO TABS
20.0000 mg | ORAL_TABLET | Freq: Every day | ORAL | Status: DC
  Administered 2015-03-16 – 2015-03-17 (×2): 20 mg via ORAL
  Filled 2015-03-16 (×2): qty 1

## 2015-03-16 MED ORDER — HYDROCODONE-ACETAMINOPHEN 5-325 MG PO TABS: 1.0000 | ORAL_TABLET | ORAL | Status: DC | PRN

## 2015-03-16 MED ORDER — ENOXAPARIN SODIUM 40 MG/0.4ML ~~LOC~~ SOLN: 40.0000 mg | SUBCUTANEOUS | Status: DC

## 2015-03-16 MED ORDER — SENNOSIDES-DOCUSATE SODIUM 8.6-50 MG PO TABS: 1.0000 | ORAL_TABLET | Freq: Every evening | ORAL | Status: DC | PRN

## 2015-03-16 NOTE — Progress Notes (Signed)
Patient ID: Stephanie [name], female   DOB: 1940/11/30, 74 y.o.   MRN: [id number] Facility; [facility] Chief complaint altered LOC History; this is a patient to came to [place] in late December 2015. She was admitted to hospital I think with complaints of shortness of breath. I think ultimately was felt to have diastolic heart failure. She has a history of at no CVA of the lung which is apparently stage I. She has had thorascopic left upper lobe lobectomy and mediastinal lymph node dissection in January 2013 she has since been started on [place] R CVA. 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 She normally is able to sit in a wheelchair interactive. Apparently this morning she got up date breakfast that sometime after this was found to be in an altered state of responsiveness I been asked to see her urgently because of this.  Past Medical History  Diagnosis Date  . CVA (cerebral infarction)     speech difficulities  . Stroke   . Hypercholesteremia     Takes Zocor daily  . Hypertension     takes Atenolol and Lisinopril daily  . Lung mass     left upper lobe  . Arthritis     hands  . TIA (transient ischemic attack)     Sat before thanksgiving;impaired speech  . Pneumothorax   . Lung cancer    Current medications; Atenolol 25 daily ASA 81 daily Celexa 40 daily Iressa 250 daily Lasix 20 daily [place] till 4 times daily when necessary Zocor 20 daily Doxepin '3mg'$  Zocor 20 daily.  Review of systems is not possible.  CBC Latest Ref Rng 03/06/2015 03/06/2015 02/18/2015  WBC 4.0 - 10.5 K/uL 6.3 5.4 4.8  Hemoglobin 12.0 - 15.0 g/dL 12.8 12.5 13.2  Hematocrit 36.0 - 46.0 % 39.0 37.7 39.6  Platelets 150 - 400 K/uL 183 150 162   Lab Results  Component Value Date   CREATININE 1.67* 03/06/2015   CREATININE 1.55* 03/06/2015   CREATININE 1.78* 02/18/2015      Physical examination Gen. patient is minimally responsive lying in  bed Vitals O2 sat is 95% on room air pulse rate 72 respirations 18 and unlabored. 92/66 Respiratory coarse crackles bilaterally at the bases but no wheezing Cardiac heart sounds are normal no murmurs Abdomen no liver no spleen perhaps some left lower quadrant tenderness to deep palpation however there was no bladder tenderness and no clear CVA tenderness. Bowel sounds are positive she is not distended Neurologic; she was able to respond to my chronic commands to grip my hands move her feet. She is hyperreflexic on the right versus the left I don't believe this is new pupils are equal and reactive.  Impression/plan #1 acute altered LOC. Her glucose is 104 she does not have a seizure history supposedly she was in her usual state this morning. She has slightly low in terms of her blood pressure but I cannot lateralize this at all. She does not appear to be dehydrated. Would wonder about pyelonephritis on the left... #2 has a history of dominant hemisphere strokes I, there is no obvious lateralizing evidence of a stroke at the bedside. No seizure history  I'm expecting she will need to go to the ER for lab work cultures and CNS imaging studies.

## 2015-03-16 NOTE — ED Notes (Signed)
Pt's necklace given to family.

## 2015-03-16 NOTE — ED Provider Notes (Signed)
CSN: [id number]     Arrival date & time 03/16/15  1121 History  This chart was scribed for Stephanie Essex, MD by Thea Alken, ED Scribe. This patient was seen in room Room 5 found and the patient's care was started at 11:26 AM.   Chief Complaint  Patient presents with  . Altered Mental Status   Patient is a 74 y.o. female presenting with altered mental status. The history is provided by the nursing home. The history is limited by the condition of the patient. No language interpreter was used.  Altered Mental Status  LEVEL 5 CAVEAT DUE TO AMS  HPI Comments:  Stephanie [name] is a 74 y.o. female with hx of CVA, stroke, hypercholesteremia, HTN, and TIA who present to the Emergency Department complaining of confusion. Nurse states, pt was brought in from the [facility] center and last acting normal around 7:30am. Pt brought in for slow speech and confusion. Pt denies HA, SOB and CP, .  Past Medical History  Diagnosis Date  . CVA (cerebral infarction)     speech difficulities  . Stroke   . Hypercholesteremia     Takes Zocor daily  . Hypertension     takes Atenolol and Lisinopril daily  . Lung mass     left upper lobe  . Arthritis     hands  . TIA (transient ischemic attack)     Sat before thanksgiving;impaired speech  . Pneumothorax   . Lung cancer   . CKD (chronic kidney disease), stage III    Past Surgical History  Procedure Laterality Date  . No past surgeries    . Lung biopsy  10/13/11    left  . Abdominal hysterectomy  1975  . Varicose vein surgery  58yr ago  . Video assisted thoracoscopy  11/18/2011    Procedure: VIDEO ASSISTED THORACOSCOPY;  Surgeon: SMelrose Nakayama MD;  [facility]  Service: Thoracic;  Laterality: Left;  . Lobectomy  11/18/2011    Procedure: LOBECTOMY;  Surgeon: SMelrose Nakayama MD;  [place] [facility]  Service: Thoracic;  Laterality: Left;  LEFT UPPER LOBECTOMY  . Breast biopsy  early 2000   Family History  Problem Relation Age of Onset  .  Stroke Mother   . Stroke Sister   . Anesthesia problems Neg Hx   . Hypotension Neg Hx   . Malignant hyperthermia Neg Hx   . Pseudochol deficiency Neg Hx   . Diabetes Brother   . Cancer Brother   . Stroke Brother    History  Substance Use Topics  . Smoking status: Never Smoker   . Smokeless tobacco: Never Used  . Alcohol Use: No   OB History    No data available     Review of Systems  Unable to perform ROS: Mental status change   Allergies  Review of patient's allergies indicates no known allergies.  Home Medications   Prior to Admission medications   Medication Sig Start Date End Date Taking? Authorizing Provider  acetaminophen (TYLENOL) 325 MG tablet Take 650 mg by mouth every 6 (six) hours as needed.   Yes Historical Provider, MD  aspirin EC 81 MG tablet Take 81 mg by mouth every morning.   Yes Historical Provider, MD  atenolol (TENORMIN) 25 MG tablet Take 25 mg by mouth daily.   Yes Historical Provider, MD  citalopram (CELEXA) 20 MG tablet Take 40 mg by mouth daily.    Yes Historical Provider, MD  diphenoxylate-atropine ([name]) 2.5-0.025 MG per tablet  Take 1 tablet by mouth 4 (four) times daily as needed for diarrhea or loose stools. 06/25/14  Yes Farrel Gobble, MD  Doxepin HCl 3 MG TABS Take 1 tablet (3 mg total) by mouth at bedtime. 02/18/15  Yes Manon Hilding Kefalas, PA-C  furosemide (LASIX) 20 MG tablet 1 tab every other day. 10/17/14  Yes Lezlie Octave Black, NP  gefitinib (IRESSA) 250 MG tablet Take 1 tablet (250 mg total) by mouth daily. 01/28/15  Yes Patrici Ranks, MD  guaiFENesin-dextromethorphan (ROBITUSSIN DM) 100-10 MG/5ML syrup Take 5 mLs by mouth every 4 (four) hours as needed for cough.   Yes Historical Provider, MD  simvastatin (ZOCOR) 20 MG tablet Take 1 tablet (20 mg total) by mouth daily. 08/27/14  Yes Scott A Luking, MD   BP 104/45 mmHg  Pulse 54  Temp(Src) 97.4 F (36.3 C)  Resp 18  Ht '5\' 7"'$  (1.702 m)  Wt 150 lb (68.04 kg)  BMI 23.49 kg/m2  SpO2  97% Physical Exam  Constitutional: She is oriented to person, place, and time. She appears well-developed and well-nourished. No distress.  HENT:  Head: Normocephalic and atraumatic.  Mouth/Throat: Oropharynx is clear and moist. No oropharyngeal exudate.  Eyes: Conjunctivae and EOM are normal. Pupils are equal, round, and reactive to light.  Neck: Normal range of motion. Neck supple.  No meningismus.  Cardiovascular: Normal rate, regular rhythm, normal heart sounds and intact distal pulses.   No murmur heard. Pulmonary/Chest: Effort normal and breath sounds normal. No respiratory distress.  Abdominal: Soft. There is no tenderness. There is no rebound and no guarding.  Musculoskeletal: Normal range of motion. She exhibits no edema or tenderness.  Neurological: She is alert and oriented to person, place, and time. No cranial nerve deficit. She exhibits normal muscle tone. Coordination normal.  CN 2-12 intact. Slow to respond. 4/5 strength throughout. Generalized weaknesses. Gait not tested. Dysarthric speech   Skin: Skin is warm.  Psychiatric: She has a normal mood and affect. Her behavior is normal.  Nursing note and vitals reviewed.   ED Course  Procedures (including critical care time) DIAGNOSTIC STUDIES: Oxygen Saturation is 97% on RA, normal by my interpretation.    COORDINATION OF CARE:  Labs Review Labs Reviewed  DIFFERENTIAL - Abnormal; Notable for the following:    Eosinophils Relative 6 (*)    All other components within normal limits  COMPREHENSIVE METABOLIC PANEL - Abnormal; Notable for the following:    Glucose, Bld 108 (*)    BUN 28 (*)    Creatinine, Ser 1.75 (*)    ALT 10 (*)    GFR calc non Af Amer 28 (*)    GFR calc Af Amer 32 (*)    All other components within normal limits  I-STAT CHEM 8, ED - Abnormal; Notable for the following:    BUN 28 (*)    Creatinine, Ser 1.70 (*)    Glucose, Bld 103 (*)    All other components within normal limits  MRSA PCR  SCREENING  ETHANOL  PROTIME-INR  APTT  CBC  TSH  URINE RAPID DRUG SCREEN (HOSP PERFORMED)  URINALYSIS, ROUTINE W REFLEX MICROSCOPIC  VITAMIN E26  BASIC METABOLIC PANEL  CBC  I-STAT TROPOININ, ED  I-STAT CG4 LACTIC ACID, ED  I-STAT CG4 LACTIC ACID, ED    Imaging Review Ct Head Wo Contrast  03/16/2015   CLINICAL DATA:  Altered mental status.  Code stroke.  EXAM: CT HEAD WITHOUT CONTRAST  TECHNIQUE: Contiguous axial images were obtained  from the base of the skull through the vertex without intravenous contrast.  COMPARISON:  CT head 08/17/2012  FINDINGS: Moderate atrophy. Chronic microvascular ischemic changes in the white matter and centrum semiovale on the right unchanged from the prior study  Negative for acute infarct.  Negative for hemorrhage or mass.  Atherosclerotic calcification.  Calvarium is intact.  IMPRESSION: Atrophy and chronic ischemia.  No acute abnormality.  Critical Value/emergent results were called by telephone at the time of interpretation on 03/16/2015 at 11:54 am to Dr. Ezequiel [name] , who verbally acknowledged these results.   Electronically Signed   By: Franchot Gallo M.D.   On: 03/16/2015 11:55   Dg Chest Portable 1 View  03/16/2015   CLINICAL DATA:  Altered mental status.  EXAM: PORTABLE CHEST - 1 VIEW  COMPARISON:  Chest CT 01/14/2015, CXR 11/08/2014  FINDINGS: Diffuse pulmonary fibrosis again noted left greater than right. Volume loss left lower lobe unchanged. Negative for heart failure or effusion.  Right-sided lung nodules are best seen on prior chest CT and not well identified on the current chest x-ray.  IMPRESSION: Diffuse pulmonary fibrosis without acute abnormality. No change from recent studies.  Right lung nodules noted on prior CT.   Electronically Signed   By: Franchot Gallo M.D.   On: 03/16/2015 12:40     EKG Interpretation   Date/Time:  Monday Mar 16 2015 14:17:48 EDT Ventricular Rate:  55 PR Interval:  198 QRS Duration: 88 QT Interval:   529 QTC Calculation: 506 R Axis:   62 Text Interpretation:  Sinus rhythm Prolonged QT interval Prolonged QT  Confirmed by Wyvonnia Dusky  MD, Adalea Handler [phone]) on 03/16/2015 2:19:43 PM      MDM   Final diagnoses:  Altered mental status, unspecified altered mental status type  Hypotension, unspecified hypotension type   Patient from nursing home with decreased mental status, decreased responsiveness and slurred speech. Patient with history of stroke and previous dysarthria. He reports she was last seen normal at 7:30 AM. Code stroke was activated on arrival  .blood workup is reassuring. Family at bedside reports that the patient's speech seems to be at baseline. They feel she is more sleepy than usual  Neurology consult obtained. Dysarthria seems persistent. CT head is negative. Patient with hypotension in the ED to 70s that responded to fluid. No urine on in and out cath. Creatinine elevated but appears to be at baseline. BP improved to 100s after IVF.  Patient more alert.  Speech and mental status at baseline per family.  Suspect dehydration and hypotension as source of decreased mental status. Doubt CVA.  No infectious symptoms.  Observation dw Dr. Wyline Copas.  I personally performed the services described in this documentation, which was scribed in my presence. The recorded information has been reviewed and is accurate.   Stephanie Essex, MD 03/16/15 [id number]

## 2015-03-16 NOTE — ED Notes (Signed)
LKW was at [place] per Staff from [facility].  Alert and oriented to person, time and place.

## 2015-03-16 NOTE — H&P (Signed)
[facility] History and Physical  Stephanie [name] ZOX:[id number] DOB: Dec 26, 1940 DOA: 03/16/2015  Referring physician: Manus Gunning PCP: Lilyan Punt, MD   Chief Complaint: ams  HPI: Stephanie [name] is a very pleasant 74 y.o. female with a past medical history that includes CVA, hypertension, left upper lobe lung mass, chronic kidney disease presents to the emergency department with the chief complaint of altered mental status. Initial evaluation reveals acute on chronic renal failure, hypotension secondary to dehydration in the setting of decreased by mouth intake and Lasix.  Resident of [facility] who sent her over to the emergency department midmorning with the chief complaint of confusion and generalized weakness. Last seen normal 3 hours prior. She has slow speech but this is a result of a previous stroke and she's currently at her baseline. There is no complaints chest pain palpitation cough fever chills. No reports of vomiting diarrhea. Daughters at the bedside and says she's been eating and drinking her normal amount but she did see oncology a proximally 6 weeks ago who encouraged her to drink more is lab work "indicates she was dehydrated". Workup in the emergency department significant for creatinine of 1.70, BUN 28 serum glucose 103. Lactic acid 1.8 am awaiting results of urinalysis. She'll troponin negative Chest x-ray with diffuse pulmonary fibrosis without acute abnormality no change from recent studies CT of the head without acute abnormality. In the emergency department she is afebrile, pretense of with blood pressure 78/57 heart rate of 56 she is not hypoxic. She is given 1 L of normal saline the time of my exam her blood pressures 108/47 she is more alert and responding appropriately to questions and commands   Review of Systems:  10 point review of systems complete and all systems are negative except as indicated in the history of present illness   Past Medical History    Diagnosis Date  . CVA (cerebral infarction)     speech difficulities  . Stroke   . Hypercholesteremia     Takes Zocor daily  . Hypertension     takes Atenolol and Lisinopril daily  . Lung mass     left upper lobe  . Arthritis     hands  . TIA (transient ischemic attack)     Sat before thanksgiving;impaired speech  . Pneumothorax   . Lung cancer   . CKD (chronic kidney disease), stage III    Past Surgical History  Procedure Laterality Date  . No past surgeries    . Lung biopsy  10/13/11    left  . Abdominal hysterectomy  1975  . Varicose vein surgery  98yrs ago  . Video assisted thoracoscopy  11/18/2011    Procedure: VIDEO ASSISTED THORACOSCOPY;  Surgeon: [name], MD;  Location[facility];  Service: Thoracic;  Laterality: Left;  . Lobectomy  11/18/2011    Procedure: LOBECTOMY;  Surgeon: [name], MD;  Location: [facility] OR;  Service: Thoracic;  Laterality: Left;  LEFT UPPER LOBECTOMY  . Breast biopsy  early 2000   Social History:  reports that she has never smoked. She has never used smokeless tobacco. She reports that she does not drink alcohol or use illicit drugs.  No Known Allergies  Family History  Problem Relation Age of Onset  . Stroke Mother   . Stroke Sister   . Anesthesia problems Neg Hx   . Hypotension Neg Hx   . Malignant hyperthermia Neg Hx   . Pseudochol deficiency Neg Hx   .  Diabetes Brother   . Cancer Brother   . Stroke Brother     Prior to Admission medications   Medication Sig Start Date End Date Taking? Authorizing Provider  acetaminophen (TYLENOL) 325 MG tablet Take 650 mg by mouth every 6 (six) hours as needed.   Yes Historical Provider, MD  aspirin EC 81 MG tablet Take 81 mg by mouth every morning.   Yes Historical Provider, MD  atenolol (TENORMIN) 25 MG tablet Take 25 mg by mouth daily.   Yes Historical Provider, MD  citalopram (CELEXA) 20 MG tablet Take 40 mg by mouth daily.    Yes Historical Provider, MD   diphenoxylate-atropine ([name]) 2.5-0.025 MG per tablet Take 1 tablet by mouth 4 (four) times daily as needed for diarrhea or loose stools. 06/25/14  Yes Alla German, MD  Doxepin HCl 3 MG TABS Take 1 tablet (3 mg total) by mouth at bedtime. 02/18/15  Yes Maurine Minister Kefalas, PA-C  furosemide (LASIX) 20 MG tablet 1 tab every other day. 10/17/14  Yes Lesle Chris Syreeta Figler, NP  gefitinib (IRESSA) 250 MG tablet Take 1 tablet (250 mg total) by mouth daily. 01/28/15  Yes Allene Pyo, MD  guaiFENesin-dextromethorphan (ROBITUSSIN DM) 100-10 MG/5ML syrup Take 5 mLs by mouth every 4 (four) hours as needed for cough.   Yes Historical Provider, MD  simvastatin (ZOCOR) 20 MG tablet Take 1 tablet (20 mg total) by mouth daily. 08/27/14  Yes Babs Sciara, MD   Physical Exam: Filed Vitals:   03/16/15 1330 03/16/15 1345 03/16/15 1400 03/16/15 1407  BP: 101/67 108/47 95/73   Pulse: 55 51 56   Temp:    97.4 F (36.3 C)  Resp: 14 16 15    Height:      Weight:      SpO2: 96% 98% 97%     Wt Readings from Last 3 Encounters:  03/16/15 68.04 kg (150 lb)  02/18/15 68.402 kg (150 lb 12.8 oz)  01/19/15 67.132 kg (148 lb)    General:  Appears calm and comfortable Eyes: PERRL, normal lids, irises & conjunctiva ENT: grossly normal hearing, lips & tongue uterus membranes of her mouth are moist and pink Neck: no LAD, masses or thyromegaly Cardiovascular: RRR, no m/r/g. No LE edema. Positive pedal pulses Telemetry: SR, no arrhythmias  Respiratory: CTA bilaterally, no w/r/r. Normal respiratory effort. Rest sounds distant Abdomen: soft, ntnd positive bowel sounds but sluggish Skin: no rash or induration seen on limited exam Musculoskeletal: grossly normal tone BUE/BLE Psychiatric: grossly normal mood and affect, speech fluent and appropriate Neurologic: grossly non-focal. A lateral grip 4 out of 5 dysarthric speech           Labs on Admission:  Basic Metabolic Panel:  Recent Labs Lab 03/16/15 1141  03/16/15 1237  NA 140 141  K 4.0 3.8  CL 105 103  CO2 29  --   GLUCOSE 108* 103*  BUN 28* 28*  CREATININE 1.75* 1.70*  CALCIUM 9.2  --    Liver Function Tests:  Recent Labs Lab 03/16/15 1141  AST 21  ALT 10*  ALKPHOS 87  BILITOT 0.6  PROT 7.2  ALBUMIN 3.6   No results for input(s): LIPASE, AMYLASE in the last 168 hours. No results for input(s): AMMONIA in the last 168 hours. CBC:  Recent Labs Lab 03/16/15 1141 03/16/15 1237  WBC 4.7  --   NEUTROABS 3.0  --   HGB 12.7 13.6  HCT 38.3 40.0  MCV 97.7  --   PLT  158  --    Cardiac Enzymes: No results for input(s): CKTOTAL, CKMB, CKMBINDEX, TROPONINI in the last 168 hours.  BNP (last 3 results) No results for input(s): BNP in the last 8760 hours.  ProBNP (last 3 results)  Recent Labs  10/15/14 2050  PROBNP 594.5*    CBG: No results for input(s): GLUCAP in the last 168 hours.  Radiological Exams on Admission: Ct Head Wo Contrast  03/16/2015   CLINICAL DATA:  Altered mental status.  Code stroke.  EXAM: CT HEAD WITHOUT CONTRAST  TECHNIQUE: Contiguous axial images were obtained from the base of the skull through the vertex without intravenous contrast.  COMPARISON:  CT head 08/17/2012  FINDINGS: Moderate atrophy. Chronic microvascular ischemic changes in the white matter and centrum semiovale on the right unchanged from the prior study  Negative for acute infarct.  Negative for hemorrhage or mass.  Atherosclerotic calcification.  Calvarium is intact.  IMPRESSION: Atrophy and chronic ischemia.  No acute abnormality.  Critical Value/emergent results were called by telephone at the time of interpretation on 03/16/2015 at 11:54 am to Dr. Glynn Octave , who verbally acknowledged these results.   Electronically Signed   By: Marlan Palau M.D.   On: 03/16/2015 11:55   Dg Chest Portable 1 View  03/16/2015   CLINICAL DATA:  Altered mental status.  EXAM: PORTABLE CHEST - 1 VIEW  COMPARISON:  Chest CT 01/14/2015, CXR  11/08/2014  FINDINGS: Diffuse pulmonary fibrosis again noted left greater than right. Volume loss left lower lobe unchanged. Negative for heart failure or effusion.  Right-sided lung nodules are best seen on prior chest CT and not well identified on the current chest x-ray.  IMPRESSION: Diffuse pulmonary fibrosis without acute abnormality. No change from recent studies.  Right lung nodules noted on prior CT.   Electronically Signed   By: Marlan Palau M.D.   On: 03/16/2015 12:40    EKG:   Assessment/Plan Principal Problem:   Acute encephalopathy: Likely related to decreased volume secondary to decreased by mouth intake in the setting of Lasix. She is much improved after 1 L fluid she received in the emergency department. So far no indications for an infectious process. ET of the head as noted above. Will admit for observation. Will continue IV fluids. Will hold Lasix for now. Will monitor intake and output. Weight results of urinalysis Active Problems:   HTN (hypertension): Blood pressure very soft on presentation. Home medications include atenolol, Lasix every other day. I'll hold both of these for now. I suspect her Lasix will need to be adjusted at discharge. Chart review indicates last discharge in December Lasix was started. I need to make Lasix when necessary versus every other day  Acute on chronic renal failure stage III. Will monitor urine output. Gentle IV fluids as noted above. Will hold any nephrotoxins. Monitor urine output. If no improvement consider renal ultrasound    Adenocarcinoma of left lung, stage 1: Recent visit with oncology getting of this month. Initial surgical resection January 2013 according to the chart. Patient has declined any further biopsy. She is on iressa without problems.    Dysarthria as late effect of stroke: Currently at baseline. Will continue her heart healthy diet    Hypotension: See #2. Continue IV fluids. Check orthostatics. Will likely need to adjust  Lasix dose at discharge.   none  Code Status: DNR DVT Prophylaxis: Family Communication: daughter at bedside Disposition Plan: back to facility 24 hours hopefully  Time spent: 60 minutes  [facility] Pager [phone]

## 2015-03-16 NOTE — Progress Notes (Signed)
Page 11:34 Pt scanned 11:46 GB called 11:50 SOC called 11:52    Nurse has pts necklace in ziploc bag

## 2015-03-16 NOTE — ED Notes (Signed)
Neuro consult completed.

## 2015-03-16 NOTE — ED Notes (Signed)
In and out cath completed, no return.

## 2015-03-16 NOTE — ED Notes (Signed)
MD at bedside. 

## 2015-03-16 NOTE — ED Notes (Signed)
Received from [facility] on stretcher.  Staff reports that pt's LKW was at 0730.  Pt is able to follow command and walk with assistance.  At 11 am, pt was not responding per staff.

## 2015-03-16 NOTE — ED Notes (Signed)
Systolic pressure 78, Dr. Wyvonnia Dusky informed. Verbal order for NS bolus 2L. First liter going in at 999 mml/hr.

## 2015-03-17 DIAGNOSIS — N179 Acute kidney failure, unspecified: Secondary | ICD-10-CM | POA: Diagnosis not present

## 2015-03-17 DIAGNOSIS — G9340 Encephalopathy, unspecified: Secondary | ICD-10-CM | POA: Diagnosis not present

## 2015-03-17 DIAGNOSIS — I779 Disorder of arteries and arterioles, unspecified: Secondary | ICD-10-CM | POA: Diagnosis not present

## 2015-03-17 DIAGNOSIS — R4182 Altered mental status, unspecified: Secondary | ICD-10-CM | POA: Diagnosis not present

## 2015-03-17 LAB — CBC
HCT: 34.2 % — ABNORMAL LOW (ref 36.0–46.0)
Hemoglobin: 11.3 g/dL — ABNORMAL LOW (ref 12.0–15.0)
MCH: 32.6 pg (ref 26.0–34.0)
MCHC: 33 g/dL (ref 30.0–36.0)
MCV: 98.6 fL (ref 78.0–100.0)
PLATELETS: 146 10*3/uL — AB (ref 150–400)
RBC: 3.47 MIL/uL — AB (ref 3.87–5.11)
RDW: 12.4 % (ref 11.5–15.5)
WBC: 5.1 10*3/uL (ref 4.0–10.5)

## 2015-03-17 LAB — BASIC METABOLIC PANEL
Anion gap: 6 (ref 5–15)
BUN: 23 mg/dL — AB (ref 6–20)
CALCIUM: 8.8 mg/dL — AB (ref 8.9–10.3)
CO2: 26 mmol/L (ref 22–32)
Chloride: 110 mmol/L (ref 101–111)
Creatinine, Ser: 1.49 mg/dL — ABNORMAL HIGH (ref 0.44–1.00)
GFR calc Af Amer: 39 mL/min — ABNORMAL LOW (ref >60)
GFR calc non Af Amer: 34 mL/min — ABNORMAL LOW (ref >60)
GLUCOSE: 97 mg/dL (ref 65–99)
Potassium: 3.8 mmol/L (ref 3.5–5.1)
Sodium: 142 mmol/L (ref 135–145)

## 2015-03-17 LAB — URINALYSIS, ROUTINE W REFLEX MICROSCOPIC
Bilirubin Urine: NEGATIVE
GLUCOSE, UA: NEGATIVE mg/dL
Ketones, ur: NEGATIVE mg/dL
Leukocytes, UA: NEGATIVE
Nitrite: NEGATIVE
PH: 5.5 (ref 5.0–8.0)
Protein, ur: NEGATIVE mg/dL
SPECIFIC GRAVITY, URINE: 1.02 (ref 1.005–1.030)
Urobilinogen, UA: 0.2 mg/dL (ref 0.0–1.0)

## 2015-03-17 LAB — RAPID URINE DRUG SCREEN, HOSP PERFORMED
Amphetamines: NOT DETECTED
Barbiturates: NOT DETECTED
Benzodiazepines: POSITIVE — AB
Cocaine: NOT DETECTED
Opiates: NOT DETECTED
TETRAHYDROCANNABINOL: NOT DETECTED

## 2015-03-17 LAB — URINE MICROSCOPIC-ADD ON

## 2015-03-17 MED ORDER — SENNOSIDES-DOCUSATE SODIUM 8.6-50 MG PO TABS: 1.0000 | ORAL_TABLET | Freq: Every evening | ORAL | Status: DC | PRN

## 2015-03-17 MED ORDER — SODIUM CHLORIDE 0.9 % IV BOLUS (SEPSIS)
250.0000 mL | Freq: Once | INTRAVENOUS | Status: AC
  Administered 2015-03-17: 250 mL via INTRAVENOUS

## 2015-03-17 NOTE — Discharge Summary (Signed)
Physician Discharge Summary  Stephanie [name] ZTI:[id number] DOB: 1940-12-08 DOA: 03/16/2015  PCP: [name], MD  Admit date: 03/16/2015 Discharge date: 03/17/2015  Time spent: 40 minutes  Recommendations for Outpatient Follow-up:  1. Follow up with PCP 1-2 weeks for evaluation of volume status, functional status recommend bmet 2. Return to [facility] center. Recommend daily PT  Discharge Diagnoses:  Principal Problem:   Acute encephalopathy Active Problems:   HTN (hypertension)   Adenocarcinoma of left lung, stage 1   Dysarthria as late effect of stroke   Bilateral carotid artery disease   Acute on chronic renal failure   Mental status change   Hypotension   Encephalopathy acute   Altered mental status   Arterial hypotension   Discharge Condition: stable  Diet recommendation: heart healthy  Filed Weights   03/16/15 1122 03/16/15 1718  Weight: 68.04 kg (150 lb) 69.219 kg (152 lb 9.6 oz)    History of present illness:  Stephanie [name] is a very pleasant 74 y.o. female with a past medical history that includes CVA, hypertension, left upper lobe lung mass, chronic kidney disease presented to the emergency department on 03/16/15 with the chief complaint of altered mental status. Initial evaluation revealed acute on chronic renal failure, hypotension secondary to dehydration in the setting of decreased by mouth intake and Lasix.  Resident of [facility] who sent her over to the emergency department midmorning with the chief complaint of confusion and generalized weakness. Last seen normal 3 hours prior. She had slow speech but this is a result of a previous stroke and she was at her baseline. There were no complaints chest pain palpitation cough fever chills. No reported vomiting diarrhea. Daughter at the bedside and reported she'd been eating and drinking her normal amount but she did see oncology a proximally 6 weeks ago who encouraged her to drink more as lab work "indicates  she was dehydrated".  Workup in the emergency department significant for creatinine of 1.70, BUN 28 serum glucose 103. Lactic acid 1.8. Urinalysis pending. Initial troponin negative Chest x-ray with diffuse pulmonary fibrosis without acute abnormality no change from recent studies, CT of the head without acute abnormality.  In the emergency department she was afebrile, hypotensive  with blood pressure 78/57 heart rate of 56 she was not hypoxic. She was given 1 L of normal saline the time of admission blood pressures 108/47 she was more alert and responding appropriately to questions and commands  Hospital Course:  Acute encephalopathy: Likely related to decreased volume secondary to decreased by mouth intake in the setting of Lasix. Provided with 1 L fluid and she improved immediately. Somewhat orthostatic on admission.  No evidence of infectious process. CT of the head as noted above. Continued with gently IV hydration and Lasix held. At discharge orthostasis resolved. Will discontinue low dose lasix. Recommend daily weight to monitor. Follow up with PCP 1-2 weeks for evaluation of volume status.  Active Problems:  HTN (hypertension): Blood pressure soft on presentation. Home medications included atenolol, Lasix every other day. [place] held on admission. Lasix discontinued on discharge. See above. Discharge in December Lasix was started for diastolic dysfunction. Will continue BB at discharge. Will discontinue low dose lasix at discharge. Recommend daily weight  Acute on chronic renal failure stage III.  Gentle IV fluids as noted above. Creatinine trending down at discharge and close to baseline.    Adenocarcinoma of left lung, stage 1: Recent visit with oncology beginning of this month. Initial surgical  resection January 2013 according to the chart. Patient has declined any further biopsy. She is on iressa without problems.   Dysarthria as late effect of stroke: remained at baseline. Will continue  her heart healthy diet   Hypotension: See #2. Continue BB discontinue lasix as noted above  Procedures:  none  Consultations:  none  Discharge Exam: Filed Vitals:   03/17/15 0919  BP: 112/51  Pulse: 64  Temp: 97.6 F (36.4 C)  Resp: 20    General: well nourished appears comfortable Cardiovascular: RRR no MGR no LE edema Respiratory: normal effort somewhat shallow no crackles  Discharge Instructions    Current Discharge Medication List    START taking these medications   Details  senna-docusate (SENOKOT-S) 8.6-50 MG per tablet Take 1 tablet by mouth at bedtime as needed for mild constipation.      CONTINUE these medications which have NOT CHANGED   Details  acetaminophen (TYLENOL) 325 MG tablet Take 650 mg by mouth every 6 (six) hours as needed.    aspirin EC 81 MG tablet Take 81 mg by mouth every morning.    atenolol (TENORMIN) 25 MG tablet Take 25 mg by mouth daily.    citalopram (CELEXA) 20 MG tablet Take 40 mg by mouth daily.     diphenoxylate-atropine ([name]) 2.5-0.025 MG per tablet Take 1 tablet by mouth 4 (four) times daily as needed for diarrhea or loose stools. Qty: 30 tablet, Refills: 5    Doxepin HCl 3 MG TABS Take 1 tablet (3 mg total) by mouth at bedtime. Qty: 30 tablet, Refills: 2   Associated Diagnoses: Insomnia    gefitinib (IRESSA) 250 MG tablet Take 1 tablet (250 mg total) by mouth daily. Qty: 30 tablet, Refills: 6    guaiFENesin-dextromethorphan (ROBITUSSIN DM) 100-10 MG/5ML syrup Take 5 mLs by mouth every 4 (four) hours as needed for cough.    simvastatin (ZOCOR) 20 MG tablet Take 1 tablet (20 mg total) by mouth daily. Qty: 30 tablet, Refills: 5      STOP taking these medications     furosemide (LASIX) 20 MG tablet        No Known Allergies Follow-up Information    Follow up with [name], MD.   Specialty:  Family Medicine   Contact information:   [address] [place] [address] [phone]         The results of significant diagnostics from this hospitalization (including imaging, microbiology, ancillary and laboratory) are listed below for reference.    Significant Diagnostic Studies: Ct Head Wo Contrast  03/16/2015   CLINICAL DATA:  Altered mental status.  Code stroke.  EXAM: CT HEAD WITHOUT CONTRAST  TECHNIQUE: Contiguous axial images were obtained from the base of the skull through the vertex without intravenous contrast.  COMPARISON:  CT head 08/17/2012  FINDINGS: Moderate atrophy. Chronic microvascular ischemic changes in the white matter and centrum semiovale on the right unchanged from the prior study  Negative for acute infarct.  Negative for hemorrhage or mass.  Atherosclerotic calcification.  Calvarium is intact.  IMPRESSION: Atrophy and chronic ischemia.  No acute abnormality.  Critical Value/emergent results were called by telephone at the time of interpretation on 03/16/2015 at 11:54 am to Dr. Ezequiel Essex , who verbally acknowledged these results.   Electronically Signed   By: Franchot Gallo M.D.   On: 03/16/2015 11:55   Dg Chest Portable 1 View  03/16/2015   CLINICAL DATA:  Altered mental status.  EXAM: PORTABLE CHEST -  1 VIEW  COMPARISON:  Chest CT 01/14/2015, CXR 11/08/2014  FINDINGS: Diffuse pulmonary fibrosis again noted left greater than right. Volume loss left lower lobe unchanged. Negative for heart failure or effusion.  Right-sided lung nodules are best seen on prior chest CT and not well identified on the current chest x-ray.  IMPRESSION: Diffuse pulmonary fibrosis without acute abnormality. No change from recent studies.  Right lung nodules noted on prior CT.   Electronically Signed   By: Franchot Gallo M.D.   On: 03/16/2015 12:40    Microbiology: No results found for this or any previous visit (from the past 240 hour(s)).   Labs: Basic Metabolic Panel:  Recent Labs Lab 03/16/15 1141 03/16/15 1237 03/17/15 0521  NA 140 141 142  K 4.0 3.8 3.8  CL 105  103 110  CO2 29  --  26  GLUCOSE 108* 103* 97  BUN 28* 28* 23*  CREATININE 1.75* 1.70* 1.49*  CALCIUM 9.2  --  8.8*   Liver Function Tests:  Recent Labs Lab 03/16/15 1141  AST 21  ALT 10*  ALKPHOS 87  BILITOT 0.6  PROT 7.2  ALBUMIN 3.6   No results for input(s): LIPASE, AMYLASE in the last 168 hours. No results for input(s): AMMONIA in the last 168 hours. CBC:  Recent Labs Lab 03/16/15 1141 03/16/15 1237 03/17/15 0521  WBC 4.7  --  5.1  NEUTROABS 3.0  --   --   HGB 12.7 13.6 11.3*  HCT 38.3 40.0 34.2*  MCV 97.7  --  98.6  PLT 158  --  146*   Cardiac Enzymes: No results for input(s): CKTOTAL, CKMB, CKMBINDEX, TROPONINI in the last 168 hours. BNP: BNP (last 3 results) No results for input(s): BNP in the last 8760 hours.  ProBNP (last 3 results)  Recent Labs  10/15/14 2050  PROBNP 594.5*    CBG: No results for input(s): GLUCAP in the last 168 hours.     SignedRadene Gunning  [facility] 03/17/2015, 10:14 AM

## 2015-03-17 NOTE — Progress Notes (Signed)
Report called to Vilinda Blanks at the [facility]. Patient ready for transport.

## 2015-03-17 NOTE — Clinical Social Work Note (Signed)
CSW notified Kerri at [facility] that patient was being discharged. CSW was advised that patient would not need an FL2 as she has been in the hospital less than 24 hours.  CSW spoke with patient's son, Mr. Bordeaux, outside of patient's room and advised that patient was being discharged today.   CSW left a message for [name] advising that patient had been seen by PT and that she had made the recommendation that patient receive PT upon return to facility.   CSW facilitated discharge.  CSW signing off.   Ihor Gully, [place] [phone]

## 2015-03-17 NOTE — Care Management Note (Signed)
Case Management Note  Patient Details  Name: LATORI BEGGS MRN: [id number] Date of Birth: January 29, 1941  Expected Discharge Date:  03/18/15               Expected Discharge Plan:  [place]  In-House Referral:  Clinical Social Work  Discharge planning Services  CM Consult  Post Acute Care Choice:  NA Choice offered to:  NA  DME Arranged:    DME Agency:     HH Arranged:    [place] Agency:     Status of Service:  Completed, signed off  Medicare Important Message Given:    Date Medicare IM Given:    Medicare IM give by:    Date Additional Medicare IM Given:    Additional Medicare Important Message give by:     If discussed at [place] of Stay Meetings, dates discussed:    Additional Comments: Pt is from [facility] SNF. Pt admitted OBS for AMS. Pt plans to DC back to [facility] today. CSW is aware and will arrange for return to facility. No CM needs.   Sherald Barge, RN 03/17/2015, 9:31 AM

## 2015-03-17 NOTE — Clinical Social Work Note (Signed)
Clinical Social Work Assessment  Patient Details  Name: Stephanie [name] MRN: [id number] Date of Birth: 20-May-1941  Date of referral:  03/17/15               Reason for consult:  Facility Placement                Permission sought to share information with:    Permission granted to share information::     Name::        Agency::     Relationship::     Contact Information:     Housing/Transportation Living arrangements for the past 2 months:  [place] of Information:  Patient Stephanie [name] at [facility]) Patient Interpreter Needed:  None Criminal Activity/Legal Involvement Pertinent to Current Situation/Hospitalization:  No - Comment as needed Significant Relationships:  Adult Children Lives with:  Facility Resident Do you feel safe going back to the place where you live?  Yes Need for family participation in patient care:  Yes (Comment) (Family willl need to continue visiting with patient as previous.)  Care giving concerns:  none   Social Worker assessment / plan:  Patient is agreeable to return to [facility]. [facility] and patient's son notified of patient's discharge. Stephanie [name] advised that an FL2 would not be needed due to patient being in hospital less than 24 hours.   CSW advised Stephanie [name] via voicemail message that patient had been recommended for PT upon return to facility.   Employment status:  Part-Time Insurance information:  Medicare PT Recommendations:  [place] / Referral to community resources:     Patient/Family's Response to care:  Patient and son agreeable for patient to return to SNF.  Patient/Family's Understanding of and Emotional Response to Diagnosis, Current Treatment, and Prognosis:  Patient agreeable to go to SNF.   Emotional Assessment Appearance:  Developmentally appropriate Attitude/Demeanor/Rapport:   (cooperative) Affect (typically observed):  Accepting Orientation:  Oriented to Self, Oriented to Place, Oriented to  Time, Oriented to  Situation Alcohol / Substance use:  Never Used Psych involvement (Current and /or in the community):  No (Comment)  Discharge Needs  Concerns to be addressed:  No discharge needs identified (Patient will need PT when she returns to the facility.) Readmission within the last 30 days:  No Current discharge risk:  None Barriers to Discharge:  No Barriers Identified   Ihor Gully, LCSW 03/17/2015, 12:49 PM

## 2015-03-17 NOTE — Evaluation (Signed)
Physical Therapy Evaluation Patient Details Name: Stephanie [name] MRN: [id number] DOB: 1941/10/18 Today's Date: 03/17/2015   History of Present Illness  Pt is a 74 year old female, resident of [facility] was admitted with acute on chronic renal failure and dehydration.  She has a history of CVA with right hemiparesis (about 4 years ago) and lung CA.  Shas been able to ambulate short distances with HHA but is primarily w/c dependent.  Clinical Impression   Pt was seen for evaluation just prior to discharge.  Son present and states that pt mobility has been in a decline and she is primarily w/c dependent, able to ambulate short distances with HHA.  Pt is currently able to ambulate 42' with HHA needing mod to max assist.  I asked her what she would like to be able to do as far as mobility.  She became very tearful and said that she would like to walk better.  I fitted her with a right platform for a walker and instructed her in its' use.  She was able to ambulate 16' with CG assist.  I am therefore recommending that she have a trial of PT for gait training with a right platform walker as she responded beautifully to it.  Son would like for OT to work with her on self-feeding as she has become unable to feed herself.    Follow Up Recommendations SNF    Equipment Recommendations  None recommended by PT (right platform walker)    Recommendations for Other Services OT consult     Precautions / Restrictions Precautions Precautions: Fall Restrictions Weight Bearing Restrictions: No      Mobility  Bed Mobility               General bed mobility comments: not tested, pt up in a chair  Transfers Overall transfer level: Needs assistance Equipment used: None Transfers: Sit to/from Stand Sit to Stand: Mod assist         General transfer comment: stance is stable   Ambulation/Gait Ambulation/Gait assistance: Min guard Ambulation Distance (Feet): 80 Feet Assistive device: Right platform  walker Gait Pattern/deviations: Trunk flexed;Shuffle Gait velocity: appropriate for situation Gait velocity interpretation: Below normal speed for age/gender General Gait Details: pt ambulated with HHA needing mod to max assist for 40'..she was fitted with a right platform walker and was able to ambulate with only guarding and fairly stable gait pattern.                       Balance Overall balance assessment: Needs assistance Sitting-balance support: No upper extremity supported;Feet supported Sitting balance-Leahy Scale: Good     Standing balance support: No upper extremity supported Standing balance-Leahy Scale: Fair                               Pertinent Vitals/Pain Pain Assessment: No/denies pain    Home Living Family/patient expects to be discharged to:: Skilled nursing facility                      Prior Function     Gait / Transfers Assistance Needed: ambulates short distances with HHA but is primarily w/c dependent  ADL's / Homemaking Assistance Needed: unknow        Hand Dominance   Dominant Hand: Right    Extremity/Trunk Assessment   Upper Extremity Assessment: RUE deficits/detail RUE Deficits / Details: pt has functional  Physical Therapy Evaluation Patient Details Name: Stephanie [name] MRN: [id number] DOB: 10/07/1941 Today's Date: 03/17/2015   History of Present Illness  Pt is a 74 year old female, resident of [facility] was admitted with acute on chronic renal failure and dehydration.  She has a history of CVA with right hemiparesis (about 4 years ago) and lung CA.  Shas been able to ambulate short distances with HHA but is primarily w/c dependent.  Clinical Impression   Pt was seen for evaluation just prior to discharge.  Son present and states that pt mobility has been in a decline and she is primarily w/c dependent, able to ambulate short distances with HHA.  Pt is currently able to ambulate 40' with HHA needing mod to max assist.  I asked her what she would like to be able to do as far as mobility.  She became very tearful and said that she would like to walk better.  I fitted her with a right platform for a walker and instructed her in its' use.  She was able to ambulate 80' with CG assist.  I am therefore recommending that she have a trial of PT for gait training with a right platform walker as she responded beautifully to it.  Son would like for OT to work with her on self-feeding as she has become unable to feed herself.    Follow Up Recommendations SNF    Equipment Recommendations  None recommended by PT (right platform walker)    Recommendations for Other Services OT consult     Precautions / Restrictions Precautions Precautions: Fall Restrictions Weight Bearing Restrictions: No      Mobility  Bed Mobility               General bed mobility comments: not tested, pt up in a chair  Transfers Overall transfer level: Needs assistance Equipment used: None Transfers: Sit to/from Stand Sit to Stand: Mod assist         General transfer comment: stance is stable   Ambulation/Gait Ambulation/Gait assistance: Min guard Ambulation Distance (Feet): 80 Feet Assistive device: Right platform  walker Gait Pattern/deviations: Trunk flexed;Shuffle Gait velocity: appropriate for situation Gait velocity interpretation: Below normal speed for age/gender General Gait Details: pt ambulated with HHA needing mod to max assist for 40'..she was fitted with a right platform walker and was able to ambulate with only guarding and fairly stable gait pattern.                       Balance Overall balance assessment: Needs assistance Sitting-balance support: No upper extremity supported;Feet supported Sitting balance-Leahy Scale: Good     Standing balance support: No upper extremity supported Standing balance-Leahy Scale: Fair                               Pertinent Vitals/Pain Pain Assessment: No/denies pain    Home Living Family/patient expects to be discharged to:: Skilled nursing facility                      Prior Function     Gait / Transfers Assistance Needed: ambulates short distances with HHA but is primarily w/c dependent  ADL's / Homemaking Assistance Needed: unknow        Hand Dominance   Dominant Hand: Right    Extremity/Trunk Assessment   Upper Extremity Assessment: RUE deficits/detail RUE Deficits / Details: pt has functional

## 2015-03-18 ENCOUNTER — Non-Acute Institutional Stay (SKILLED_NURSING_FACILITY): Payer: Medicare Other | Admitting: Internal Medicine

## 2015-03-18 DIAGNOSIS — R410 Disorientation, unspecified: Secondary | ICD-10-CM | POA: Diagnosis not present

## 2015-03-19 ENCOUNTER — Ambulatory Visit: Payer: Medicare Other | Admitting: Family Medicine

## 2015-03-20 ENCOUNTER — Other Ambulatory Visit (HOSPITAL_COMMUNITY)
Admission: AD | Admit: 2015-03-20 | Discharge: 2015-03-20 | Disposition: A | Discharge status: Home / Self Care | Payer: Medicare Other | Source: Skilled Nursing Facility | Attending: Internal Medicine | Admitting: Internal Medicine

## 2015-03-20 DIAGNOSIS — I5032 Chronic diastolic (congestive) heart failure: Secondary | ICD-10-CM | POA: Diagnosis present

## 2015-03-20 DIAGNOSIS — N189 Chronic kidney disease, unspecified: Secondary | ICD-10-CM | POA: Diagnosis not present

## 2015-03-20 LAB — BASIC METABOLIC PANEL
ANION GAP: 4 — AB (ref 5–15)
BUN: 15 mg/dL (ref 6–20)
CALCIUM: 9 mg/dL (ref 8.9–10.3)
CO2: 27 mmol/L (ref 22–32)
Chloride: 107 mmol/L (ref 101–111)
Creatinine, Ser: 1.42 mg/dL — ABNORMAL HIGH (ref 0.44–1.00)
GFR calc Af Amer: 41 mL/min — ABNORMAL LOW (ref >60)
GFR, EST NON AFRICAN AMERICAN: 36 mL/min — AB (ref >60)
Glucose, Bld: 110 mg/dL — ABNORMAL HIGH (ref 65–99)
POTASSIUM: 4.2 mmol/L (ref 3.5–5.1)
SODIUM: 138 mmol/L (ref 135–145)

## 2015-03-24 NOTE — Progress Notes (Addendum)
Patient ID: Stephanie [name], female   DOB: [birth year], 74 y.o.   MRN: [id number]                PROGRESS NOTE  DATE:  03/18/2015          FACILITY: [facility]                     LEVEL OF CARE:   SNF   Acute Visit                      CHIEF COMPLAINT:  Follow up trip to the hospital.       HISTORY OF PRESENT ILLNESS:  This is a patient whom I saw two days ago.  She had been well in the morning, then later in the day was found minimally responsive.  She was sent to the ER.  She was felt to be mildly dehydrated.  She was given some IV fluids.  A urine culture was negative.  Chest x-ray suggested pulmonary fibrosis.    She is now back to her normal self.  However, per the staff, she is now requiring oxygen at 2 L due to sats in the 70s.    PHYSICAL EXAMINATION:   VITAL SIGNS:     RESPIRATIONS:  20, and unlabored.   02 SATURATIONS:  I cannot get my O2 sat monitors to register.    GENERAL APPEARANCE:  The patient does not look to be in any distress.   CHEST/RESPIRATORY:  There are bibasilar crackles, which would be compatible with pulmonary fibrosis in the right setting.   CARDIOVASCULAR:   CARDIAC:  Heart sounds are normal.  Her JVP is not elevated.      GASTROINTESTINAL:   ABDOMEN:  Soft.   LIVER/SPLEEN/KIDNEYS:  No liver, no spleen.  No tenderness.     CIRCULATION:   EDEMA/VARICOSITIES:  Extremities:  No edema.    ASSESSMENT/PLAN:                   Acute delirium.  I wonder whether this could have been postictal.  I do not really see a lot of evidence for an alternative explanation based on her overnight stay in the hospital.  She was perhaps slightly dehydrated, presenting with a creatinine of 1.7.  She left with a creatinine of 1.49, not that impressive.  Her lactic acid levels were not elevated.    I would observe the patient for the moment.   She appears back to her old self.  No obvious neurologic findings that are different from her previous.

## 2015-03-27 ENCOUNTER — Encounter (HOSPITAL_COMMUNITY): Payer: Self-pay | Admitting: Hematology & Oncology

## 2015-03-27 ENCOUNTER — Encounter (HOSPITAL_BASED_OUTPATIENT_CLINIC_OR_DEPARTMENT_OTHER): Payer: Medicare Other | Admitting: Hematology & Oncology

## 2015-03-27 ENCOUNTER — Encounter (HOSPITAL_BASED_OUTPATIENT_CLINIC_OR_DEPARTMENT_OTHER): Payer: Medicare Other

## 2015-03-27 VITALS — BP 100/39 | HR 56 | Temp 97.8°F | Resp 16 | Wt 151.9 lb

## 2015-03-27 DIAGNOSIS — C3492 Malignant neoplasm of unspecified part of left bronchus or lung: Secondary | ICD-10-CM

## 2015-03-27 DIAGNOSIS — C801 Malignant (primary) neoplasm, unspecified: Secondary | ICD-10-CM | POA: Diagnosis not present

## 2015-03-27 DIAGNOSIS — C3412 Malignant neoplasm of upper lobe, left bronchus or lung: Secondary | ICD-10-CM

## 2015-03-27 LAB — CBC WITH DIFFERENTIAL/PLATELET
BASOS ABS: 0 10*3/uL (ref 0.0–0.1)
BASOS PCT: 0 % (ref 0–1)
EOS ABS: 0.3 10*3/uL (ref 0.0–0.7)
Eosinophils Relative: 6 % — ABNORMAL HIGH (ref 0–5)
HEMATOCRIT: 37.1 % (ref 36.0–46.0)
HEMOGLOBIN: 12 g/dL (ref 12.0–15.0)
LYMPHS ABS: 0.7 10*3/uL (ref 0.7–4.0)
Lymphocytes Relative: 13 % (ref 12–46)
MCH: 32.2 pg (ref 26.0–34.0)
MCHC: 32.3 g/dL (ref 30.0–36.0)
MCV: 99.5 fL (ref 78.0–100.0)
MONO ABS: 0.5 10*3/uL (ref 0.1–1.0)
MONOS PCT: 9 % (ref 3–12)
NEUTROS ABS: 3.8 10*3/uL (ref 1.7–7.7)
Neutrophils Relative %: 72 % (ref 43–77)
Platelets: 186 10*3/uL (ref 150–400)
RBC: 3.73 MIL/uL — AB (ref 3.87–5.11)
RDW: 12.4 % (ref 11.5–15.5)
WBC: 5.3 10*3/uL (ref 4.0–10.5)

## 2015-03-27 LAB — COMPREHENSIVE METABOLIC PANEL
ALBUMIN: 3.7 g/dL (ref 3.5–5.0)
ALT: 11 U/L — ABNORMAL LOW (ref 14–54)
AST: 22 U/L (ref 15–41)
Alkaline Phosphatase: 80 U/L (ref 38–126)
Anion gap: 9 (ref 5–15)
BILIRUBIN TOTAL: 0.6 mg/dL (ref 0.3–1.2)
BUN: 24 mg/dL — ABNORMAL HIGH (ref 6–20)
CALCIUM: 9.1 mg/dL (ref 8.9–10.3)
CO2: 28 mmol/L (ref 22–32)
Chloride: 102 mmol/L (ref 101–111)
Creatinine, Ser: 1.53 mg/dL — ABNORMAL HIGH (ref 0.44–1.00)
GFR calc Af Amer: 38 mL/min — ABNORMAL LOW (ref >60)
GFR calc non Af Amer: 33 mL/min — ABNORMAL LOW (ref >60)
GLUCOSE: 115 mg/dL — AB (ref 65–99)
POTASSIUM: 4.3 mmol/L (ref 3.5–5.1)
Sodium: 139 mmol/L (ref 135–145)
Total Protein: 7.3 g/dL (ref 6.5–8.1)

## 2015-03-27 NOTE — Progress Notes (Signed)
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 female returns for follow-up of her lung cancer. She now resides at the [facility] She has had a recent admission for dehydration. Her son is with her today and states that she has no problems eating but has very poor fluid intake. They have been working aggressively to ensure adequate fluid intake every day. He states she is doing better.  Her son expressed that the new chemo med is doing well and she isn't having any trouble with it.  Her son says she is confined to her wheelchair now due to weakness. He states she is simply to unsteady on her feet to walk without significant assistance.   MEDICAL HISTORY: Past Medical History  Diagnosis Date  . CVA (cerebral infarction)     speech difficulities  . Stroke   . Hypercholesteremia     Takes Zocor daily  . Hypertension     takes Atenolol and Lisinopril daily  . Lung mass     left upper lobe  . Arthritis     hands  . TIA (transient ischemic attack)     Sat before thanksgiving;impaired speech  . Pneumothorax   . Lung cancer   . CKD (chronic kidney disease), stage III     has CVA (cerebral infarction); HTN (hypertension); Pneumothorax of left lung after biopsy; Adenocarcinoma of left lung, stage 1; Dysarthria as late effect of stroke; Bilateral carotid artery disease; Other and unspecified hyperlipidemia; Lack of coordination; Muscle weakness (generalized); Hyperlipidemia; Bronchitis; Diastolic CHF, acute; Dyspnea; Acute on chronic renal failure; Cough; Anemia; Mental status change; Hypotension; Acute encephalopathy; Encephalopathy acute; Altered mental status; and  Arterial hypotension on her problem list.      Adenocarcinoma of left lung, stage 1   10/19/2011 Imaging PET/CT showing activity in LUL lesion, no mediastinal metastasis. no evidence of distant mestastasis   10/19/2011 Initial Diagnosis Adenocarcinoma with Exon 19 deletion, ALK-   11/08/2011 Surgery Thoracoscopic left upper lobectomy with mediastinal exploration, T2aN0 adenocarcinoma with bronchoalveolar  characteristics,  4.3cm, exon 19 deletion, ALK negative, Dr. Roxan Hockey   07/31/2013 Imaging CT chest- Stable postoperative and post therapy change in the left hemi- thorax. No evidence of lung cancer recurrence.   02/12/2014 Imaging Ct chest- The previously seen pulmonary nodules are stable. However, there appears to be new ground-glass pulmonary nodules in the right upper lobe. These are small and nonspecific. Cannot exclude metastases.   05/26/2014 Imaging CT chest- Right lung nodules are grossly stable from 02/12/2014 but appear new/enlarged from 07/16/2012, raising concern for bronchogenic carcinoma. Pulmonary parenchymal pattern of fibrosis does not appear progressive from 07/16/2012   06/25/2014 - 11/30/2014 Chemotherapy Tarceva 25 mg daily started by Dr. Barnet Glasgow ([facility] Tenen)   10/16/2014 Imaging CT chest- Stable nodules in right lung. The largest nodule in right lower lobe measures 1.6 x 1.5 cm stable from prior exam. No definite new pulmonary nodules are noted.   12/02/2014 Treatment Plan Change Inappropriate use of Tarceva without biopsy proven disease recurrence and inappropriately low dose.   01/14/2015 Imaging CT chest, Continued slow growth of multiple ground-glass and sub solid right lung nodules compared with older prior studies. Findings remain suspicious for  metastatic disease or development of multicentric adenocarcinoma.   02/13/2015 -  Chemotherapy Iressa 250 mg daily.     has No Known Allergies.  Ms. Chavis does not currently have medications on file.  SURGICAL HISTORY: Past  Surgical History  Procedure Laterality Date  . No past surgeries    . Lung biopsy  10/13/11    left  . Abdominal hysterectomy  1975  . Varicose vein surgery  46yr ago  . Video assisted thoracoscopy  11/18/2011    Procedure: VIDEO ASSISTED THORACOSCOPY;  Surgeon: SMelrose Nakayama MD;  Location: [facility]  Service: Thoracic;  Laterality: Left;  . Lobectomy  11/18/2011    Procedure: LOBECTOMY;  Surgeon: SMelrose Nakayama MD;  Location[facility]  Service: Thoracic;  Laterality: Left;  LEFT UPPER LOBECTOMY  . Breast biopsy  early 2000    SOCIAL HISTORY: History   Social History  . Marital Status: Widowed    Spouse Name: N/A  . Number of Children: N/A  . Years of Education: N/A   Occupational History  . Not on file.   Social History Main Topics  . Smoking status: Never Smoker   . Smokeless tobacco: Never Used  . Alcohol Use: No  . Drug Use: No  . Sexual Activity: Not Currently    Birth Control/ Protection: Post-menopausal, Surgical   Other Topics Concern  . Not on file   Social History Narrative    FAMILY HISTORY: Family History  Problem Relation Age of Onset  . Stroke Mother   . Stroke Sister   . Anesthesia problems Neg Hx   . Hypotension Neg Hx   . Malignant hyperthermia Neg Hx   . Pseudochol deficiency Neg Hx   . Diabetes Brother   . Cancer Brother   . Stroke Brother     Review of Systems  Constitutional: Negative.  HENT: Negative.   Eyes: Negative.   Respiratory: Negative. Cardiovascular: Negative.   Gastrointestinal: Negative.   Genitourinary: Negative. Musculoskeletal: Negative.   Skin: Negative.   Neurological: Positive for sensory change, speech change, focal weakness and weakness.  Endo/Heme/Allergies: Negative.   Psychiatric/Behavioral: Negative.   14 point review of systems was performed and is negative except as detailed under history of present illness and above NOTE that ROS was obtained through patient's son.  PHYSICAL  EXAMINATION  ECOG PERFORMANCE STATUS: 2 - Symptomatic, <50% confined to bed  Patient is confined to wheelchair secondary to CVA    Filed Vitals:   03/27/15 1003  BP: 100/39  Pulse: 56  Temp: 97.8 F (36.6 C)  Resp: 16    Physical Exam  Constitutional: She is oriented to person, place, and time and well-developed, well-nourished, and in no distress.  Mild R arm hemiparesis. Struggles with speech. Gets onto the exam table with assistance  She is in a wheelchair.  HENT:  Head: Normocephalic and atraumatic.  Mouth/Throat: Oropharynx is clear and moist. No oropharyngeal exudate.  Eyes: Conjunctivae and EOM are normal. Pupils are equal, round, and reactive to light. No scleral icterus.  Neck: Normal range of motion. Neck supple. No tracheal deviation present. No thyromegaly present.  Cardiovascular: Normal rate and regular rhythm.   Pulmonary/Chest: Effort normal.   Abdominal: Soft. Bowel sounds are normal. She exhibits no distension and no mass. There is no tenderness. There is no rebound and no guarding.  Musculoskeletal: Normal range of motion.   Lymphadenopathy:    She has no cervical adenopathy.  Neurological: She is alert and oriented to person,  place, and time. No cranial nerve deficit.  Obvious speech impaiment  Skin: Skin is warm and dry.  Psychiatric:  Mood appropriate but flat    LABORATORY DATA:  CBC    Component Value Date/Time   WBC 5.3 03/27/2015 1015   RBC 3.73* 03/27/2015 1015   HGB 12.0 03/27/2015 1015   HCT 37.1 03/27/2015 1015   PLT 186 03/27/2015 1015   MCV 99.5 03/27/2015 1015   MCH 32.2 03/27/2015 1015   MCHC 32.3 03/27/2015 1015   RDW 12.4 03/27/2015 1015   LYMPHSABS 0.7 03/27/2015 1015   MONOABS 0.5 03/27/2015 1015   EOSABS 0.3 03/27/2015 1015   BASOSABS 0.0 03/27/2015 1015   CMP     Component Value Date/Time   NA 139 03/27/2015 1015   K 4.3 03/27/2015 1015   CL 102 03/27/2015 1015   CO2 28 03/27/2015 1015   GLUCOSE 115* 03/27/2015 1015    BUN 24* 03/27/2015 1015   CREATININE 1.53* 03/27/2015 1015   CALCIUM 9.1 03/27/2015 1015   PROT 7.3 03/27/2015 1015   ALBUMIN 3.7 03/27/2015 1015   AST 22 03/27/2015 1015   ALT 11* 03/27/2015 1015   ALKPHOS 80 03/27/2015 1015   BILITOT 0.6 03/27/2015 1015   GFRNONAA 33* 03/27/2015 1015   GFRAA 38* 03/27/2015 1015    ASSESSMENT and THERAPY PLAN:   T2 N0 adenocarcinoma of the left upper lung status post resection in 2013 Progressive stage IV disease based on CT imaging, no biopsy Disease reported as EGFR mutated Patient refuses biopsy of lung lesions, history of left pneumothorax after initial biopsy History of low-dose Tarceva use (25 mg daily) Iressa daily with excellent tolerance, started first week 01/2015   She appears to be tolerating therapy without any reported side effects. Her issues revolve around her previously sustained CVA.  We will continue with therapy for an additional 4 to 6 weeks and then consider repeat staging. We will see her back in the clinic with an office visit and labs in one month.   All questions were answered. The patient knows to call the clinic with any problems, questions or concerns. We can certainly see the patient much sooner if necessary.  This note was signed electronically  This document serves as a record of services personally performed by Ancil Linsey, MD. It was created on her behalf by Arlyce Harman, a trained medical scribe. The creation of this record is based on the scribe's personal observations and the provider's statements to them. This document has been checked and approved by the attending provider.  I have reviewed the above documentation for accuracy and completeness, and I agree with the above.  Molli Hazard MD 03/30/2015

## 2015-03-27 NOTE — Patient Instructions (Signed)
[place] at [facility] Discharge Instructions  RECOMMENDATIONS MADE BY THE CONSULTANT AND ANY TEST RESULTS WILL BE SENT TO YOUR REFERRING PHYSICIAN.  Exam and discussion by Dr. Whitney Muse No changes in current therapy. Will address scans upon return. Call with any concerns.  Labs and office visit in 1 month.  Thank you for choosing [place] [facility] to provide your oncology and hematology care.  To afford each patient quality time with our provider, please arrive at least 15 minutes before your scheduled appointment time.    You need to re-schedule your appointment should you arrive 10 or more minutes late.  We strive to give you quality time with our providers, and arriving late affects you and other patients whose appointments are after yours.  Also, if you no show three or more times for appointments you may be dismissed from the clinic at the providers discretion.     Again, thank you for choosing [facility].  Our hope is that these requests will decrease the amount of time that you wait before being seen by our physicians.       _____________________________________________________________  Should you have questions after your visit to [facility], please contact our office at [phone] between the hours of 8:30 a.m. and 4:30 p.m.  Voicemails left after 4:30 p.m. will not be returned until the following business day.  For prescription refill requests, have your pharmacy contact our office.

## 2015-03-27 NOTE — Progress Notes (Signed)
Labs drawn

## 2015-03-30 ENCOUNTER — Encounter (HOSPITAL_COMMUNITY): Payer: Self-pay | Admitting: Hematology & Oncology

## 2015-04-11 ENCOUNTER — Emergency Department (HOSPITAL_COMMUNITY): Payer: Medicare Other

## 2015-04-11 ENCOUNTER — Emergency Department (HOSPITAL_COMMUNITY)
Admission: EM | Admit: 2015-04-11 | Discharge: 2015-04-11 | Disposition: A | Discharge status: Home / Self Care | Payer: Medicare Other | Attending: Emergency Medicine | Admitting: Emergency Medicine

## 2015-04-11 ENCOUNTER — Encounter (HOSPITAL_COMMUNITY): Payer: Self-pay | Admitting: Emergency Medicine

## 2015-04-11 DIAGNOSIS — Z8673 Personal history of transient ischemic attack (TIA), and cerebral infarction without residual deficits: Secondary | ICD-10-CM | POA: Insufficient documentation

## 2015-04-11 DIAGNOSIS — Z7982 Long term (current) use of aspirin: Secondary | ICD-10-CM | POA: Insufficient documentation

## 2015-04-11 DIAGNOSIS — N390 Urinary tract infection, site not specified: Secondary | ICD-10-CM

## 2015-04-11 DIAGNOSIS — M549 Dorsalgia, unspecified: Secondary | ICD-10-CM | POA: Diagnosis present

## 2015-04-11 DIAGNOSIS — N183 Chronic kidney disease, stage 3 (moderate): Secondary | ICD-10-CM | POA: Insufficient documentation

## 2015-04-11 DIAGNOSIS — M1990 Unspecified osteoarthritis, unspecified site: Secondary | ICD-10-CM | POA: Insufficient documentation

## 2015-04-11 DIAGNOSIS — R109 Unspecified abdominal pain: Secondary | ICD-10-CM

## 2015-04-11 DIAGNOSIS — I129 Hypertensive chronic kidney disease with stage 1 through stage 4 chronic kidney disease, or unspecified chronic kidney disease: Secondary | ICD-10-CM | POA: Diagnosis not present

## 2015-04-11 DIAGNOSIS — Z79899 Other long term (current) drug therapy: Secondary | ICD-10-CM | POA: Insufficient documentation

## 2015-04-11 DIAGNOSIS — E780 Pure hypercholesterolemia: Secondary | ICD-10-CM | POA: Insufficient documentation

## 2015-04-11 DIAGNOSIS — Z85118 Personal history of other malignant neoplasm of bronchus and lung: Secondary | ICD-10-CM | POA: Diagnosis not present

## 2015-04-11 LAB — URINALYSIS, ROUTINE W REFLEX MICROSCOPIC
Bilirubin Urine: NEGATIVE
Glucose, UA: NEGATIVE mg/dL
Ketones, ur: NEGATIVE mg/dL
Nitrite: NEGATIVE
Protein, ur: 30 mg/dL — AB
Specific Gravity, Urine: 1.02 (ref 1.005–1.030)
UROBILINOGEN UA: 0.2 mg/dL (ref 0.0–1.0)
pH: 6 (ref 5.0–8.0)

## 2015-04-11 LAB — COMPREHENSIVE METABOLIC PANEL
ALT: 11 U/L — AB (ref 14–54)
AST: 22 U/L (ref 15–41)
Albumin: 3.6 g/dL (ref 3.5–5.0)
Alkaline Phosphatase: 85 U/L (ref 38–126)
Anion gap: 9 (ref 5–15)
BUN: 27 mg/dL — ABNORMAL HIGH (ref 6–20)
CHLORIDE: 104 mmol/L (ref 101–111)
CO2: 26 mmol/L (ref 22–32)
Calcium: 8.9 mg/dL (ref 8.9–10.3)
Creatinine, Ser: 1.5 mg/dL — ABNORMAL HIGH (ref 0.44–1.00)
GFR calc Af Amer: 39 mL/min — ABNORMAL LOW (ref >60)
GFR, EST NON AFRICAN AMERICAN: 33 mL/min — AB (ref >60)
Glucose, Bld: 108 mg/dL — ABNORMAL HIGH (ref 65–99)
POTASSIUM: 4 mmol/L (ref 3.5–5.1)
Sodium: 139 mmol/L (ref 135–145)
Total Bilirubin: 0.6 mg/dL (ref 0.3–1.2)
Total Protein: 7.3 g/dL (ref 6.5–8.1)

## 2015-04-11 LAB — CBC WITH DIFFERENTIAL/PLATELET
BASOS ABS: 0 10*3/uL (ref 0.0–0.1)
BASOS PCT: 0 % (ref 0–1)
EOS PCT: 4 % (ref 0–5)
Eosinophils Absolute: 0.3 10*3/uL (ref 0.0–0.7)
HEMATOCRIT: 38.9 % (ref 36.0–46.0)
HEMOGLOBIN: 12.9 g/dL (ref 12.0–15.0)
Lymphocytes Relative: 16 % (ref 12–46)
Lymphs Abs: 1.1 10*3/uL (ref 0.7–4.0)
MCH: 32.5 pg (ref 26.0–34.0)
MCHC: 33.2 g/dL (ref 30.0–36.0)
MCV: 98 fL (ref 78.0–100.0)
MONO ABS: 0.7 10*3/uL (ref 0.1–1.0)
Monocytes Relative: 11 % (ref 3–12)
Neutro Abs: 4.6 10*3/uL (ref 1.7–7.7)
Neutrophils Relative %: 69 % (ref 43–77)
PLATELETS: 188 10*3/uL (ref 150–400)
RBC: 3.97 MIL/uL (ref 3.87–5.11)
RDW: 12.5 % (ref 11.5–15.5)
WBC: 6.8 10*3/uL (ref 4.0–10.5)

## 2015-04-11 LAB — LIPASE, BLOOD: LIPASE: 15 U/L — AB (ref 22–51)

## 2015-04-11 LAB — URINE MICROSCOPIC-ADD ON

## 2015-04-11 MED ORDER — TRAMADOL HCL 50 MG PO TABS
50.0000 mg | ORAL_TABLET | Freq: Once | ORAL | Status: AC
  Administered 2015-04-11: 50 mg via ORAL
  Filled 2015-04-11: qty 1

## 2015-04-11 MED ORDER — CEPHALEXIN 500 MG PO CAPS: 500.0000 mg | ORAL_CAPSULE | Freq: Four times a day (QID) | ORAL | Status: DC

## 2015-04-11 MED ORDER — TRAMADOL HCL 50 MG PO TABS: 50.0000 mg | ORAL_TABLET | Freq: Four times a day (QID) | ORAL | Status: DC | PRN

## 2015-04-11 MED ORDER — CEPHALEXIN 500 MG PO CAPS
500.0000 mg | ORAL_CAPSULE | Freq: Once | ORAL | Status: AC
  Administered 2015-04-11: 500 mg via ORAL
  Filled 2015-04-11: qty 1

## 2015-04-11 NOTE — ED Notes (Signed)
Lab at bedside

## 2015-04-11 NOTE — Discharge Instructions (Signed)
Extensive workup for the back pain and flank pain. CT scan without any significant findings she does have some gallstones but based on labs and abdominal exam probably not related to this pain. Most likely related to urinary tract infection and perhaps musculoskeletal back pain. No evidence of any acute bony injuries to the back. Urine culture is pending. Started here on Keflex recommend Keflex for the next 7 days. Also recommending continuing tramadol for the pain. Return for any new or worse symptoms or if not improving in 2 days.

## 2015-04-11 NOTE — ED Notes (Signed)
MD at bedside. 

## 2015-04-11 NOTE — ED Notes (Signed)
Having right sided back pain since yesterday.  Rates pain 9/10.  Have not taken any pain medication today.

## 2015-04-11 NOTE — ED Notes (Signed)
Pt alert. Family member given discharge instructions, paperwork & prescription(s). Family member verbalized understanding. [facility] called & report given to Christian.  Pt left department in wheelchair w/ no further questions.

## 2015-04-11 NOTE — ED Provider Notes (Signed)
CSN: [id number]     Arrival date & time 04/11/15  1452 History   First MD Initiated Contact with Patient 04/11/15 1622     Chief Complaint  Patient presents with  . Back Pain    Right side     (Consider location/radiation/quality/duration/timing/severity/associated sxs/prior Treatment) Patient is a 74 y.o. female presenting with back pain. The history is provided by the patient and a relative. The history is limited by the condition of the patient.  Back Pain  level V caveat applies due to previous stroke and speech difficulties. Difficult to get historical information. According to family member. Patient is a [employer] resident was out to have lunch with the family. Apparently started with some right-sided back pain yesterday got worse today seemed to be rated 9 out of 10. No nausea no vomiting. No history of any fall. Pain seems to be right side of the back and right flank.  Past Medical History  Diagnosis Date  . CVA (cerebral infarction)     speech difficulities  . Stroke   . Hypercholesteremia     Takes Zocor daily  . Hypertension     takes Atenolol and Lisinopril daily  . Lung mass     left upper lobe  . Arthritis     hands  . TIA (transient ischemic attack)     Sat before thanksgiving;impaired speech  . Pneumothorax   . Lung cancer   . CKD (chronic kidney disease), stage III    Past Surgical History  Procedure Laterality Date  . No past surgeries    . Lung biopsy  10/13/11    left  . Abdominal hysterectomy  1975  . Varicose vein surgery  53yr ago  . Video assisted thoracoscopy  11/18/2011    Procedure: VIDEO ASSISTED THORACOSCOPY;  Surgeon: SMelrose Nakayama MD;  Location: [facility]  Service: Thoracic;  Laterality: Left;  . Lobectomy  11/18/2011    Procedure: LOBECTOMY;  Surgeon: SMelrose Nakayama MD;  Location: [facility]  Service: Thoracic;  Laterality: Left;  LEFT UPPER LOBECTOMY  . Breast biopsy  early 2000   Family History  Problem Relation Age of Onset   . Stroke Mother   . Stroke Sister   . Anesthesia problems Neg Hx   . Hypotension Neg Hx   . Malignant hyperthermia Neg Hx   . Pseudochol deficiency Neg Hx   . Diabetes Brother   . Cancer Brother   . Stroke Brother    History  Substance Use Topics  . Smoking status: Never Smoker   . Smokeless tobacco: Never Used  . Alcohol Use: No   OB History    No data available     Review of Systems  Unable to perform ROS Musculoskeletal: Positive for back pain.   level V caveat applies to some level of confusion on the part of the patient. Patient known to have speech difficulties following her stroke.    Allergies  Review of patient's allergies indicates no known allergies.  Home Medications   Prior to Admission medications   Medication Sig Start Date End Date Taking? Authorizing Provider  acetaminophen (TYLENOL) 325 MG tablet Take 650 mg by mouth every 6 (six) hours as needed.   Yes Historical Provider, MD  aspirin EC 81 MG tablet Take 81 mg by mouth every morning.   Yes Historical Provider, MD  atenolol (TENORMIN) 25 MG tablet Take 25 mg by mouth daily.   Yes Historical Provider, MD  citalopram (CELEXA) 20 MG  tablet Take 40 mg by mouth daily.    Yes Historical Provider, MD  diphenoxylate-atropine ([name]) 2.5-0.025 MG per tablet Take 1 tablet by mouth 4 (four) times daily as needed for diarrhea or loose stools. 06/25/14  Yes Farrel Gobble, MD  Doxepin HCl 3 MG TABS Take 1 tablet (3 mg total) by mouth at bedtime. 02/18/15  Yes Manon Hilding Kefalas, PA-C  gefitinib (IRESSA) 250 MG tablet Take 1 tablet (250 mg total) by mouth daily. 01/28/15  Yes Patrici Ranks, MD  guaiFENesin-dextromethorphan (ROBITUSSIN DM) 100-10 MG/5ML syrup Take 5 mLs by mouth every 4 (four) hours as needed for cough.   Yes Historical Provider, MD  senna-docusate (SENOKOT-S) 8.6-50 MG per tablet Take 1 tablet by mouth at bedtime as needed for mild constipation. 03/17/15  Yes Lezlie Octave Black, NP  simvastatin (ZOCOR)  20 MG tablet Take 1 tablet (20 mg total) by mouth daily. 08/27/14  Yes Kathyrn Drown, MD  cephALEXin (KEFLEX) 500 MG capsule Take 1 capsule (500 mg total) by mouth 4 (four) times daily. 04/11/15   Fredia Sorrow, MD  traMADol (ULTRAM) 50 MG tablet Take 1 tablet (50 mg total) by mouth every 6 (six) hours as needed. 04/11/15   Fredia Sorrow, MD   BP 138/60 mmHg  Pulse 64  Temp(Src) 98.2 F (36.8 C) (Oral)  Resp 18  Ht '5\' 8"'$  (1.727 m)  Wt 151 lb (68.493 kg)  BMI 22.96 kg/m2  SpO2 98% Physical Exam  Constitutional: She appears well-developed and well-nourished. No distress.  HENT:  Head: Normocephalic and atraumatic.  Mouth/Throat: Oropharynx is clear and moist.  Eyes: Conjunctivae and EOM are normal. Pupils are equal, round, and reactive to light.  Neck: Normal range of motion.  Cardiovascular: Normal rate, regular rhythm and normal heart sounds.   No murmur heard. Pulmonary/Chest: Effort normal and breath sounds normal. No respiratory distress.  Abdominal: Soft. Bowel sounds are normal. There is no tenderness.  Musculoskeletal: Normal range of motion.  Neurological: She is alert. No cranial nerve deficit. She exhibits normal muscle tone. Coordination normal.  Skin: Skin is warm. No erythema.  Nursing note and vitals reviewed.   ED Course  Procedures (including critical care time) Labs Review Labs Reviewed  URINALYSIS, ROUTINE W REFLEX MICROSCOPIC (NOT AT [facility]) - Abnormal; Notable for the following:    APPearance HAZY (*)    Hgb urine dipstick LARGE (*)    Protein, ur 30 (*)    Leukocytes, UA MODERATE (*)    All other components within normal limits  COMPREHENSIVE METABOLIC PANEL - Abnormal; Notable for the following:    Glucose, Bld 108 (*)    BUN 27 (*)    Creatinine, Ser 1.50 (*)    ALT 11 (*)    GFR calc non Af Amer 33 (*)    GFR calc Af Amer 39 (*)    All other components within normal limits  LIPASE, BLOOD - Abnormal; Notable for the following:    Lipase 15 (*)     All other components within normal limits  URINE MICROSCOPIC-ADD ON - Abnormal; Notable for the following:    Squamous Epithelial / LPF FEW (*)    Bacteria, UA MANY (*)    All other components within normal limits  URINE CULTURE  CBC WITH DIFFERENTIAL/PLATELET   Results for orders placed or performed during the hospital encounter of 04/11/15  Urinalysis, Routine w reflex microscopic (not at [facility])  Result Value Ref Range   Color, Urine YELLOW YELLOW  APPearance HAZY (A) CLEAR   Specific Gravity, Urine 1.020 1.005 - 1.030   pH 6.0 5.0 - 8.0   Glucose, UA NEGATIVE NEGATIVE mg/dL   Hgb urine dipstick LARGE (A) NEGATIVE   Bilirubin Urine NEGATIVE NEGATIVE   Ketones, ur NEGATIVE NEGATIVE mg/dL   Protein, ur 30 (A) NEGATIVE mg/dL   Urobilinogen, UA 0.2 0.0 - 1.0 mg/dL   Nitrite NEGATIVE NEGATIVE   Leukocytes, UA MODERATE (A) NEGATIVE  Comprehensive metabolic panel  Result Value Ref Range   Sodium 139 135 - 145 mmol/L   Potassium 4.0 3.5 - 5.1 mmol/L   Chloride 104 101 - 111 mmol/L   CO2 26 22 - 32 mmol/L   Glucose, Bld 108 (H) 65 - 99 mg/dL   BUN 27 (H) 6 - 20 mg/dL   Creatinine, Ser 1.50 (H) 0.44 - 1.00 mg/dL   Calcium 8.9 8.9 - 10.3 mg/dL   Total Protein 7.3 6.5 - 8.1 g/dL   Albumin 3.6 3.5 - 5.0 g/dL   AST 22 15 - 41 U/L   ALT 11 (L) 14 - 54 U/L   Alkaline Phosphatase 85 38 - 126 U/L   Total Bilirubin 0.6 0.3 - 1.2 mg/dL   GFR calc non Af Amer 33 (L) >60 mL/min   GFR calc Af Amer 39 (L) >60 mL/min   Anion gap 9 5 - 15  Lipase, blood  Result Value Ref Range   Lipase 15 (L) 22 - 51 U/L  CBC with Differential/Platelet  Result Value Ref Range   WBC 6.8 4.0 - 10.5 K/uL   RBC 3.97 3.87 - 5.11 MIL/uL   Hemoglobin 12.9 12.0 - 15.0 g/dL   HCT 38.9 36.0 - 46.0 %   MCV 98.0 78.0 - 100.0 fL   MCH 32.5 26.0 - 34.0 pg   MCHC 33.2 30.0 - 36.0 g/dL   RDW 12.5 11.5 - 15.5 %   Platelets 188 150 - 400 K/uL   Neutrophils Relative % 69 43 - 77 %   Neutro Abs 4.6 1.7 - 7.7 K/uL    Lymphocytes Relative 16 12 - 46 %   Lymphs Abs 1.1 0.7 - 4.0 K/uL   Monocytes Relative 11 3 - 12 %   Monocytes Absolute 0.7 0.1 - 1.0 K/uL   Eosinophils Relative 4 0 - 5 %   Eosinophils Absolute 0.3 0.0 - 0.7 K/uL   Basophils Relative 0 0 - 1 %   Basophils Absolute 0.0 0.0 - 0.1 K/uL  Urine microscopic-add on  Result Value Ref Range   Squamous Epithelial / LPF FEW (A) RARE   WBC, UA 11-20 <3 WBC/hpf   RBC / HPF TOO NUMEROUS TO COUNT <3 RBC/hpf   Bacteria, UA MANY (A) RARE     Imaging Review Ct Renal Stone Study  04/11/2015   CLINICAL DATA:  Right-sided back pain and flank pain since yesterday. History of chronic kidney disease. Hysterectomy. Dementia. Lung cancer.  EXAM: CT ABDOMEN AND PELVIS WITHOUT CONTRAST  TECHNIQUE: Multidetector CT imaging of the abdomen and pelvis was performed following the standard protocol without IV contrast.  COMPARISON:  02/12/2014.  Chest CT of 01/14/2015.  FINDINGS: Lower chest: Basilar pulmonary nodules. Example anterior right line 11 mm nodule on image 6 of series 6, stable. A 9 mm perifissural nodule on image 9 is similar back to the 02/12/2014 exam.  Interstitial lung disease, as evidenced by architectural distortion and traction bronchiectasis. No honeycombing. Mild cardiomegaly with multivessel coronary artery atherosclerosis. Small hiatal hernia.  Hepatobiliary: No focal liver lesion. Mild prominence of the caudate lobe and lateral segment left liver lobes. Gallstones or sludge, without acute cholecystitis or biliary ductal dilatation.  Pancreas: Pancreatic atrophy and fatty replacement, without duct dilatation or dominant mass.  Spleen: Normal.  Adrenals/Urinary Tract: Normal adrenal glands. Renal cortical thinning bilaterally. Punctate renal collecting system calculi bilaterally. Bilateral renal sinus cysts. No hydroureter. No bladder calculi.  Stomach/Bowel: Small hiatal hernia. Scattered colonic diverticula. Mild motion degradation. Normal terminal ileum.  No right lower quadrant inflammation. Appendix likely visualized on image 52. Normal small bowel.  Vascular/Lymphatic: Advanced aortic and branch vessel atherosclerosis. No abdominopelvic adenopathy.  Reproductive: Hysterectomy.  No adnexal mass.  Other: No significant free fluid.  Pelvic floor laxity.  Musculoskeletal: Osteopenia. Ninth posterior lateral left rib remote fracture. Advanced lumbosacral spondylosis. Mild convex left lumbar spine curvature.  IMPRESSION: 1. Mildly motion degraded exam. 2. Bilateral nephrolithiasis, without obstructive uropathy. 3. Bibasilar pulmonary nodules. Incompletely evaluated, but felt to be similar to 01/14/2015. Please see that report. 4. 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 5. Cholelithiasis. 6. Hepatic morphology for which mild cirrhosis cannot be excluded. Correlate with risk factors.   Electronically Signed   By: Abigail Miyamoto M.D.   On: 04/11/2015 17:45     EKG Interpretation None      MDM   Final diagnoses:  Flank pain  UTI (lower urinary tract infection)    Workup shows evidence of urinary tract infection. Patient does have gallstones with do not feel symptoms are related to that. No leukocytosis no liver function test abnormalities. CT scan showed no evidence of any inflammation of the gallbladder. Symptoms also could be related to muscle skeletal back pain. It is difficult to obtain significant store: Information on the patient. Patient does have a history of stroke in the past.  Patient improved here with tramadol feeling better. Will be treated with Keflex here recommend Keflex for the next 7 days and tramadol as needed for pain. Urine culture is pending.    Fredia Sorrow, MD 04/11/15 2008

## 2015-04-12 ENCOUNTER — Inpatient Hospital Stay
Admission: RE | Admit: 2015-04-12 | Discharge: 2015-10-22 | Disposition: A | Discharge status: Nursing Home (Includes ICF) | Payer: Medicaid Other | Source: Ambulatory Visit | Attending: Internal Medicine | Admitting: Internal Medicine

## 2015-04-13 LAB — URINE CULTURE

## 2015-04-14 ENCOUNTER — Encounter: Payer: Self-pay | Admitting: Internal Medicine

## 2015-04-14 ENCOUNTER — Non-Acute Institutional Stay (SKILLED_NURSING_FACILITY): Payer: Medicare Other | Admitting: Internal Medicine

## 2015-04-14 DIAGNOSIS — Z8673 Personal history of transient ischemic attack (TIA), and cerebral infarction without residual deficits: Secondary | ICD-10-CM

## 2015-04-14 DIAGNOSIS — N179 Acute kidney failure, unspecified: Secondary | ICD-10-CM

## 2015-04-14 DIAGNOSIS — N189 Chronic kidney disease, unspecified: Secondary | ICD-10-CM | POA: Diagnosis not present

## 2015-04-14 DIAGNOSIS — N390 Urinary tract infection, site not specified: Secondary | ICD-10-CM | POA: Insufficient documentation

## 2015-04-14 DIAGNOSIS — N10 Acute tubulo-interstitial nephritis: Secondary | ICD-10-CM | POA: Diagnosis not present

## 2015-04-14 DIAGNOSIS — M549 Dorsalgia, unspecified: Secondary | ICD-10-CM | POA: Diagnosis not present

## 2015-04-14 DIAGNOSIS — I10 Essential (primary) hypertension: Secondary | ICD-10-CM | POA: Diagnosis not present

## 2015-04-14 NOTE — Progress Notes (Signed)
Patient ID: Stephanie [name], female   DOB: 05-13-1941, 74 y.o.   MRN: [id number]       this is a routine--acute   visit.  Level care skilled.  Facility [facility].   CHIEF COMPLAINT: Medical management of CVA late effect-hypertension-depression-CHF- Acute visit status post ER visit for back pain-follow-up question UTI.    HISTORY OF PRESENT ILLNESS:  This is a patient who came to [place] on 10/19/2014.  She had been on [facility] with shortness of breath.    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    She also has diastolic heart failure.    I She appears to be quite stable has gained strength during her stay here.  She did have an ER visit last month for altered mental status although no acute etiology was really found she was thought to be mildly dehydrated.  She also went to the ER over the weekend-apparently she was brought by family member after complaining of severe back pain-it appears they did a fairly extensive workup in the ER labs were not really concerning a CT scan of the abdomen apparently showed gallstones but it was thought this was not causing her symptoms.  It was thought she may have a UTI per urinalysis results showing bacteria and elevated WBCs and leukocytes.  She was empirically placed on Keflex however I have reviewed a urine culture this afternoon and appears it only grew out 6000 colonies of np specific origin  She was given Ultram for pain.  Currently she appears to be back at her baseline does not complain of abdominal pain physical exam today was essentially baseline-apparently she is now only receiving Tylenol for pain but  does not really complain of back pain this afternoon.  Her weights appear to be stable actually has lost it appears about 5 pounds over the past month she does not have any increased edema from baseline she had been on Lasix at one point for history CHF but I do not see that she is on this anymore nonetheless she appears to be stable in this regards.  She does have baseline renal insufficiency creatinine in the ER 2 days ago was 1.5 which appears to be relatively her baseline  She does have a history of CVA with some right-sided weakness and dysarthria-she is on aspirin for anticoagulation-again she appears to be somewhat stronger progressively-she does ambulate about facility in the wheelchair.      Family medical social history reviewed per oncology note 12/02/2014 as well as admission note 10/19/2014. An ER note on 04/11/2015 as well as oncology note 03/27/2015  Medications have been reviewed per [facility] Atenolol 25 mg daily hold for pulse less than 60.  Aspirin 81 mg daily.  Celexa 40 mg daily.  Irresa 250 mg every morning  Keflex 500 mg 4 times a day I suspect we will discontinue this since urine culture is come back essentially benign.  [name] when necessary.  Robitussin 5 mL every 4 hours when necessary cough.  Senokot when necessary.  Doxepin 3 mg daily at bedtime.  Tylenol 650 mg every 4 hours when necessary.  Zocor 20 mg daily at  bedtime    Review of systems-somewhat limited secondary dysarthria   In general no complaints of fever or chills.  Respiratory does not complain of cough or shortness of breath.  Head ears eyes nose mouth and throat only complaint is nasal stuffiness.  Respiratory no complaints of cough or shortness of breath.  Cardiac no chest pain minimal lower extremity edema.  GI does not complain of any nausea vomiting diarrhea constipation or abdominal discomfort.  Muscle skeletal does not complain of joint pain or back pain this  afternoon.  Neurologic is not complaining of dizziness or headache does have right-sided weakness with history of CVA   PHYSICAL EXAMINATION   Temperature 97.1 pulse 56 respirations 18 blood pressure 138/60 this appears to be relatively baseline her weight is 152.7 appears her weights average in the lower to mid 150's  In general this is a pleasant elderly female sitting comfortably in her wheelchair  Her skin is warm and dry.  Eyes sclera and conjunctiva are clear visual acuity appears grossly intact.  Nose  did not appreciate any active drainage.  Oropharynx is clear mucous membranes moist.  :   CHEST/RESPIRATORY:  Shallow, but otherwise clear air entry bilaterally.   CARDIOVASCULAR:  CARDIAC:   Heart sounds are normal.  She appears to be euvolemic rate and rhythm there is minimal lower extremity edema.--She has baseline varicosities   Musculoskeletal-again right-sided weakness which is chronic status post CVA-I could not really appreciate significant back pain with palpation this afternoon  Abdomen is soft nontender with positive bowel sounds  NEUROLOGICAL:    CRANIAL NERVES:  She has a cortically-based language problem, but can converse.   SENSATION/STRENGTH:  She has right upper extremity greater than right lower extremity weakness,   Labs.  04/11/2015.  Sodium 139 potassium 4 BUN 27 creatinine 1.5.  Liver function tests within normal limits except ALT of 11.  WBC 6.8 hemoglobin 12.9 platelets 188.    03/06/2015.  Sodium 139 potassium 4.6 BUN 22 creatinine 1.55.  Liver function tests within normal limits.  WBC 5.4 hemoglobin 12.5 platelets 150.  02/18/2015.  Sodium 138 potassium 4.2 BUN 32 creatinine 1.78  12/03/2014.  Sodium 138 potassium 4.4 BUN 19 creatinine 1.39.  11/19/2014.  WBC 6.3 hemoglobin 13.9 platelets 216.      ASSESSMENT/PLAN:                              Late-effect dominant hemisphere CVA.  Probably this is fairly distant she  appears to have gained some strength she is on aspirin for anticoagulation  --she is also on a statin-will update a lipid panel  Diastolic heart failure.  At this point this appears stable--no longer onLasix  nonetheless her weights appear to be stable clinically appears to be stable--appears to have some element of renal insufficiency with baseline creatinine appearing to run in the mid 1's area.    History of adeno-CA of the lung. She is followed by oncology per consultation with family  discontinued the Tarceva. She is now on Iressa and appears to be tolerating this well--appears they want to keep her on this for about the next month  .  Anemia.  She has  Had  an OB-positive stool, but hemoglobin shows stability  History of renal insufficiency this appears to be  Fairly  stable      History of hypertension this appears stable she is on atenolol there are orders to hold this for  pulse less than 60.--Recent blood pressures ranged from 138/60-105/61 I also see 147/61 I do not see consistent elevations                                                             Depression this actually appears to be stable on Celexa.    Back pain-at this point appears to be controlled with the Tylenol there is recommendation for tramadol from ER if needed will monitor would not be hesitant to really start this if she is having recurrence.  Question UTI. Urine culture is fairly benign Will discontinue the Keflex                                                                                       [id number]-of note greater than 40 minutes spent assessing patient-discussing her status with nursing staff-reviewing her chart-and  coordinating and formulating a plan of care for numerous diagnoses-of note greater than 50% of time spent coordinating plan of care with  chart review

## 2015-04-15 ENCOUNTER — Encounter (HOSPITAL_COMMUNITY)
Admission: AD | Admit: 2015-04-15 | Discharge: 2015-04-15 | Disposition: A | Discharge status: Home / Self Care | Payer: Medicare Other | Source: Skilled Nursing Facility | Attending: Internal Medicine | Admitting: Internal Medicine

## 2015-04-15 DIAGNOSIS — E785 Hyperlipidemia, unspecified: Secondary | ICD-10-CM | POA: Insufficient documentation

## 2015-04-15 DIAGNOSIS — N189 Chronic kidney disease, unspecified: Secondary | ICD-10-CM | POA: Insufficient documentation

## 2015-04-15 LAB — LIPID PANEL
CHOLESTEROL: 133 mg/dL (ref 0–200)
HDL: 49 mg/dL (ref >40)
LDL CALC: 71 mg/dL (ref 0–99)
Total CHOL/HDL Ratio: 2.7 RATIO
Triglycerides: 64 mg/dL (ref <150)
VLDL: 13 mg/dL (ref 0–40)

## 2015-04-29 ENCOUNTER — Other Ambulatory Visit (HOSPITAL_COMMUNITY): Payer: Medicare Other

## 2015-04-29 ENCOUNTER — Ambulatory Visit (HOSPITAL_COMMUNITY): Payer: Medicare Other | Admitting: Oncology

## 2015-04-29 NOTE — Assessment & Plan Note (Deleted)
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  She is tolerating Iressa well without any known side effects or toxicities.    Labs in 4 weeks: CBC diff, CMET  CT chest wo contrast in 3 weeks for restaging purposes and to evaluate response to therapy.  Return in 4 weeks for follow-up.

## 2015-04-29 NOTE — Progress Notes (Signed)
-  No show-  Stephanie [name] 04/29/2015

## 2015-05-08 ENCOUNTER — Non-Acute Institutional Stay (SKILLED_NURSING_FACILITY): Payer: Medicare Other | Admitting: Internal Medicine

## 2015-05-08 ENCOUNTER — Encounter (HOSPITAL_COMMUNITY)
Admission: RE | Admit: 2015-05-08 | Discharge: 2015-05-08 | Disposition: A | Discharge status: Home / Self Care | Payer: Medicare Other | Source: Ambulatory Visit | Attending: Internal Medicine | Admitting: Internal Medicine

## 2015-05-08 ENCOUNTER — Encounter: Payer: Self-pay | Admitting: Internal Medicine

## 2015-05-08 DIAGNOSIS — Z8673 Personal history of transient ischemic attack (TIA), and cerebral infarction without residual deficits: Secondary | ICD-10-CM | POA: Diagnosis not present

## 2015-05-08 DIAGNOSIS — I10 Essential (primary) hypertension: Secondary | ICD-10-CM

## 2015-05-08 DIAGNOSIS — C3492 Malignant neoplasm of unspecified part of left bronchus or lung: Secondary | ICD-10-CM | POA: Diagnosis not present

## 2015-05-08 DIAGNOSIS — R21 Rash and other nonspecific skin eruption: Secondary | ICD-10-CM | POA: Diagnosis not present

## 2015-05-08 DIAGNOSIS — N179 Acute kidney failure, unspecified: Secondary | ICD-10-CM

## 2015-05-08 DIAGNOSIS — N189 Chronic kidney disease, unspecified: Secondary | ICD-10-CM

## 2015-05-08 DIAGNOSIS — I5031 Acute diastolic (congestive) heart failure: Secondary | ICD-10-CM | POA: Diagnosis not present

## 2015-05-08 DIAGNOSIS — M869 Osteomyelitis, unspecified: Secondary | ICD-10-CM | POA: Insufficient documentation

## 2015-05-08 DIAGNOSIS — C3432 Malignant neoplasm of lower lobe, left bronchus or lung: Secondary | ICD-10-CM | POA: Insufficient documentation

## 2015-05-08 DIAGNOSIS — A4902 Methicillin resistant Staphylococcus aureus infection, unspecified site: Secondary | ICD-10-CM | POA: Insufficient documentation

## 2015-05-08 NOTE — Progress Notes (Signed)
Patient ID: Stephanie [name], female   DOB: 20-Nov-1940, 74 y.o.   MRN: [id number]        this is a routine-  visit.  Level care skilled.  Facility [facility].   CHIEF COMPLAINT: Medical management of CVA late effect-hypertension-depression-CHF-acute visit follow-up breast rash   HISTORY OF PRESENT ILLNESS:  This is a patient who came to [place] on 10/19/2014.  She had been on [facility] with shortness of breath.    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    She also has diastolic heart failure.    I She appears to be quite stable has gained strength during her stay here.  She did have an ER visit several weeks ago for altered mental status although no acute etiology was really found she was thought to be mildly dehydrated.  She also went to the ER about 3 weeks ago-apparently she was brought by family member after complaining of severe back pain-it appears they did a fairly extensive workup in the ER labs were not really concerning a CT scan of the abdomen apparently showed gallstones but it was thought this was not causing her symptoms.  It was thought she may have a UTI per urinalysis results showing bacteria and elevated WBCs and leukocytes.  She was empirically placed on Keflex  However her urine culture ultimately did not appear to be concerning  She was given Ultram for pain.  Currently she appears to be back at her baseline does not complain of abdominal pain physical exam today was essentially baseline-apparently she is now only receiving Tylenol for pain but does not really complain of back pain this afternoon.  Her weights appear to be stable -- she does not have any increased edema from baseline she had been on  Lasix at one point for history CHF but I do not see that she is on this anymore nonetheless she appears to be stable in this regards.  She does have baseline renal insufficiency creatinine in the ER 2 last month  was 1.5 which appears to be relatively her baseline  She does have a history of CVA with some right-sided weakness and dysarthria-she is on aspirin for anticoagulation-again she appears to be somewhat stronger progressively-she does ambulate about facility in the wheelchair.   She does have a history of a right breast rash she is receiving topical nystatin and this appears to be improving although family is somewhat concerned about it     Family medical social history reviewed per oncology note 12/02/2014 as well as admission note 10/19/2014. and progress note on 04/14/2015  Medications have been reviewed per [facility] Atenolol 25 mg daily hold for pulse less than 60.  Aspirin 81 mg daily.  Celexa 40 mg daily.  Irresa 250 mg every morning    [name] when necessary.  Robitussin 5 mL every 4 hours when necessary cough.  Senokot when necessary.  Doxepin 3 mg daily at bedtime.  Tylenol 650 mg every 4 hours when necessary.  Zocor 20 mg daily at bedtime    Review of systems-somewhat limited secondary dysarthria obtained  from patient and nursing  In general no complaints of fever or chills  Skin-history of right breast rash as noted  above.  Respiratory does not complain of cough or shortness of breath.  Head ears eyes nose mouth and throat does not complaining of sore throat or visual changes  Respiratory no complaints of cough or shortness of breath.  Cardiac no chest pain minimal lower extremity edema.  GI does not complain of any nausea vomiting diarrhea constipation or abdominal discomfort.  Muscle skeletal does not complain of joint pain or back pain this afternoon.  Neurologic is not complaining of dizziness or headache does have right-sided weakness with  history of CVA   PHYSICAL EXAMINATION    Temperature 97.8 pulse 60 respirations 18 blood pressure 137/59 weight is 154.5 this appears to be relatively stable  In general this is a pleasant elderly female sitting comfortably in her wheelchair  Her skin is warm and dry.--She has a pale erythematous rash under her right breast per nursing this is looking improved  Eyes sclera and conjunctiva are clear visual acuity appears grossly intact.  Nose  did not appreciate any active drainage.  Oropharynx is clear mucous membranes moist.  :   CHEST/RESPIRATORY:  Shallow, but otherwise clear air entry bilaterally.   CARDIOVASCULAR:  CARDIAC:  Regular irregular rate  She appears to be euvolemic-- there is minimal lower extremity edema.--She has baseline varicosities   Musculoskeletal-again right-sided weakness which is chronic status post CVA-  Abdomen is soft nontender with positive bowel sounds  NEUROLOGICAL:    CRANIAL NERVES:  She has a cortically-based language problem, but can converse.   SENSATION/STRENGTH:  She has right upper extremity greater than right lower extremity weakness,   Labs  04/15/2015.  Cholesterol 133 triglycerides 64 HDL 49 LDL 71.  04/11/2015.  Sodium 139 potassium 4 BUN 27 creatinine 1.5.  Liver function tests within normal limits except ALT of 11.  WBC 6.8 hemoglobin 12.9 platelets 188.    03/06/2015.  Sodium 139 potassium 4.6 BUN 22 creatinine 1.55.  Liver function tests within normal limits.  WBC 5.4 hemoglobin 12.5 platelets 150.  02/18/2015.  Sodium 138 potassium 4.2 BUN 32 creatinine 1.78  12/03/2014.  Sodium 138 potassium 4.4 BUN 19 creatinine 1.39.  11/19/2014.  WBC 6.3 hemoglobin 13.9 platelets 216.      ASSESSMENT/PLAN:                              Late-effect dominant hemisphere CVA.  Probably this is fairly distant she appears to have gained some strength she is on aspirin for anticoagulation  --she is also on a  statin-recent lipid panel appears to be unremarkable LDL 71  Diastolic heart failure.  At this point this appears stable--no longer onLasix  nonetheless her weights appear to be stable clinically appears to be stable--appears to have some element of renal insufficiency with baseline creatinine appearing to run in the mid 1's area-- we will update this.    History of adeno-CA of the lung. She is followed by oncology per consultation with family  discontinued the Tarceva. She is now on Iressa and appears to be tolerating this well--appears they want to keep her on this for about the next month  .  Anemia.  She has  Had  an OB-positive stool, but hemoglobin shows stability we will update this  History of renal insufficiency this appears to be  Fairly  stable  --will update this    History of hypertension this appears stable she is on atenolol there are orders to hold this for pulse  less than 60.--Recent blood pressures ranged from   137/59-134/65-138/70                                                             Depression this actually appears to be stable on Celexa.    Back pain-at this point appears to be controlled with the Tylenol  Breast rash-as noted above this appears to be resolving with the topical nystatin-per discussion with nursing- also discussed this with her daughter via phone-at this point will monitor                                                                                          [phone]

## 2015-05-11 ENCOUNTER — Encounter (HOSPITAL_COMMUNITY)
Admission: RE | Admit: 2015-05-11 | Discharge: 2015-05-11 | Disposition: A | Discharge status: Home / Self Care | Payer: Medicare Other | Source: Skilled Nursing Facility | Attending: Internal Medicine | Admitting: Internal Medicine

## 2015-05-11 DIAGNOSIS — M869 Osteomyelitis, unspecified: Secondary | ICD-10-CM | POA: Diagnosis not present

## 2015-05-11 DIAGNOSIS — C3432 Malignant neoplasm of lower lobe, left bronchus or lung: Secondary | ICD-10-CM | POA: Diagnosis not present

## 2015-05-11 DIAGNOSIS — A4902 Methicillin resistant Staphylococcus aureus infection, unspecified site: Secondary | ICD-10-CM | POA: Diagnosis not present

## 2015-05-11 LAB — BASIC METABOLIC PANEL
Anion gap: 5 (ref 5–15)
BUN: 18 mg/dL (ref 6–20)
CHLORIDE: 106 mmol/L (ref 101–111)
CO2: 30 mmol/L (ref 22–32)
CREATININE: 1.42 mg/dL — AB (ref 0.44–1.00)
Calcium: 9 mg/dL (ref 8.9–10.3)
GFR calc Af Amer: 41 mL/min — ABNORMAL LOW (ref >60)
GFR, EST NON AFRICAN AMERICAN: 36 mL/min — AB (ref >60)
Glucose, Bld: 93 mg/dL (ref 65–99)
Potassium: 4.4 mmol/L (ref 3.5–5.1)
Sodium: 141 mmol/L (ref 135–145)

## 2015-05-11 LAB — CBC
HCT: 36.5 % (ref 36.0–46.0)
Hemoglobin: 11.9 g/dL — ABNORMAL LOW (ref 12.0–15.0)
MCH: 32.6 pg (ref 26.0–34.0)
MCHC: 32.6 g/dL (ref 30.0–36.0)
MCV: 100 fL (ref 78.0–100.0)
Platelets: 148 10*3/uL — ABNORMAL LOW (ref 150–400)
RBC: 3.65 MIL/uL — AB (ref 3.87–5.11)
RDW: 12.5 % (ref 11.5–15.5)
WBC: 4.2 10*3/uL (ref 4.0–10.5)

## 2015-05-15 ENCOUNTER — Non-Acute Institutional Stay (SKILLED_NURSING_FACILITY): Payer: Medicare Other | Admitting: Internal Medicine

## 2015-05-15 DIAGNOSIS — R0981 Nasal congestion: Secondary | ICD-10-CM | POA: Diagnosis not present

## 2015-05-15 DIAGNOSIS — R059 Cough, unspecified: Secondary | ICD-10-CM

## 2015-05-15 DIAGNOSIS — R05 Cough: Secondary | ICD-10-CM

## 2015-05-15 NOTE — Progress Notes (Signed)
Patient ID: Stephanie [name], female   DOB: 09-15-1941, 74 y.o.   MRN: [id number]         this is an acute  visit.  Level care skilled.  Facility [facility].   CHIEF COMPLAINT:  Acute visit secondary to cough and head congestion   HISTORY OF PRESENT ILLNESS:  This is a patient who came to [place] on 10/19/2014.  She had been on [facility] with shortness of breath.    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    She also has diastolic heart failure.   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   I  .   t     Family medical social history reviewed  admission note 10/19/2014. and progress note on 7/04//2016  Medications have been reviewed per [facility] Atenolol 25 mg daily hold for pulse less than 60.  Aspirin 81 mg daily.  Celexa 40 mg daily.  Irresa 250 mg every morning    [name] when necessary.  Robitussin 5 mL every 4 hours when necessary cough.  Senokot when necessary.  Doxepin 3 mg daily at bedtime.  Tylenol 650 mg every 4 hours when necessary.  Zocor 20 mg daily at bedtime    Review of systems-somewhat limited secondary dysarthria obtained  from patient and nursing  In general no complaints of fever or chills  .  Respiratory--has a cough apparently nonproductive she does not really complain of shortness of breath  Head ears eyes nose mouth and throat does not complaining of sore throat but apparently has some head congestion according to nursing staff and  family  Respiratory no complaints of cough or shortness of breath.  Cardiac no chest pain minimal lower extremity edema.  GI does not complain of any nausea vomiting diarrhea constipation or abdominal discomfort.  Muscle skeletal does not complain of joint pain or back pain this afternoon.  Neurologic is not complaining of dizziness or headache does have right-sided weakness with history of CVA   PHYSICAL EXAMINATION  She is afebrile pulse is 60 respirations 20 blood pressure 110/68 O2 saturation is 98% on room air  In general this is a pleasant elderly female sitting comfortably in her wheelchair she looks a bit tired otherwise at her baseline  Her skin is warm and dry.--   Eyes sclera and conjunctiva are clear visual acuity appears grossly intact.  Nose  did not appreciate any active drainage.  Oropharynx is clear mucous membranes moist.  :   CHEST/RESPIRATORY:  No labored breathing some slight congestion on expiration at the bases  CARDIOVASCULAR:  CARDIAC:  Regular irregular rate  She appears to be euvolemic-- there is minimal lower extremity edema.--She has baseline varicosities   Musculoskeletal-again right-sided weakness which is chronic status post CVA-  Abdomen is soft nontender with positive bowel sounds  NEUROLOGICAL:    CRANIAL NERVES:  She has a cortically-based language problem, but can converse.   SENSATION/STRENGTH:  She has right upper extremity greater than right lower extremity weakness,   Labs  05/11/2015.  Sodium 141 potassium 4.4 BUN 18 creatinine 1.42 CO2 30.  WBC 4.2 hemoglobin 11.9 platelets 148  04/15/2015.  Cholesterol 133 triglycerides 64 HDL 49 LDL 71.  04/11/2015.  Sodium 139 potassium 4 BUN 27 creatinine 1.5.  Liver function tests within normal limits except ALT of 11.  WBC 6.8 hemoglobin 12.9 platelets 188.    03/06/2015.  Sodium 139 potassium 4.6 BUN 22 creatinine 1.55.  Liver function tests within normal  limits.  WBC 5.4 hemoglobin 12.5 platelets 150.  02/18/2015.  Sodium 138 potassium 4.2 BUN 32 creatinine 1.78  12/03/2014.  Sodium 138 potassium 4.4 BUN 19 creatinine 1.39.  11/19/2014.  WBC 6.3 hemoglobin 13.9 platelets 216.      ASSESSMENT/PLAN:  Cough-we will check a chest x-ray 2 view-also start Mucinex 600 mg twice a day for 5 days hopefully this will help somewhat with the head congestion as well as cough-monitor vital signs pulse ox every shift for 72 hours.  Also will update a CBC with differential a metabolic panel tomorrow.                                Late-effect dominant hemisphere CVA.  Probably this is fairly distant she appears to have gained some strength she is on aspirin for anticoagulation  --she is also on a statin-recent lipid panel appears to be unremarkable LDL 71  Diastolic heart failure.  At this point this appears stable--no longer onLasix   clinically appears to be stable--appears to have some element of renal insufficiency with baseline creatinine appearing to run in the mid 1's area-- we will update this  .    History of adeno-CA of the lung. She is followed by oncology per consultation with family  discontinued the Tarceva. She is now on Iressa and appears to be tolerating this well- .   [id number]                                                                                               [id number]

## 2015-05-16 ENCOUNTER — Other Ambulatory Visit: Payer: Self-pay | Admitting: Internal Medicine

## 2015-05-16 ENCOUNTER — Ambulatory Visit (HOSPITAL_COMMUNITY)
Admission: RE | Admit: 2015-05-16 | Discharge: 2015-05-16 | Disposition: A | Discharge status: Home / Self Care | Payer: Medicare Other | Source: Ambulatory Visit | Attending: Internal Medicine | Admitting: Internal Medicine

## 2015-05-16 ENCOUNTER — Encounter: Payer: Self-pay | Admitting: Internal Medicine

## 2015-05-16 DIAGNOSIS — C3432 Malignant neoplasm of lower lobe, left bronchus or lung: Secondary | ICD-10-CM | POA: Diagnosis not present

## 2015-05-16 DIAGNOSIS — R059 Cough, unspecified: Secondary | ICD-10-CM

## 2015-05-16 DIAGNOSIS — R05 Cough: Secondary | ICD-10-CM | POA: Insufficient documentation

## 2015-05-16 LAB — BASIC METABOLIC PANEL
ANION GAP: 6 (ref 5–15)
Anion gap: 8 (ref 5–15)
BUN: 22 mg/dL — ABNORMAL HIGH (ref 6–20)
BUN: 23 mg/dL — AB (ref 6–20)
CHLORIDE: 105 mmol/L (ref 101–111)
CO2: 29 mmol/L (ref 22–32)
CO2: 27 mmol/L (ref 22–32)
CREATININE: 1.59 mg/dL — AB (ref 0.44–1.00)
Calcium: 9.5 mg/dL (ref 8.9–10.3)
Calcium: 8.9 mg/dL (ref 8.9–10.3)
Chloride: 104 mmol/L (ref 101–111)
Creatinine, Ser: 1.55 mg/dL — ABNORMAL HIGH (ref 0.44–1.00)
GFR calc Af Amer: 36 mL/min — ABNORMAL LOW (ref >60)
GFR calc non Af Amer: 31 mL/min — ABNORMAL LOW (ref >60)
GFR, EST AFRICAN AMERICAN: 37 mL/min — AB (ref >60)
GFR, EST NON AFRICAN AMERICAN: 32 mL/min — AB (ref >60)
GLUCOSE: 119 mg/dL — AB (ref 65–99)
GLUCOSE: 102 mg/dL — AB (ref 65–99)
Potassium: 5.7 mmol/L — ABNORMAL HIGH (ref 3.5–5.1)
Potassium: 4.1 mmol/L (ref 3.5–5.1)
Sodium: 140 mmol/L (ref 135–145)
Sodium: 139 mmol/L (ref 135–145)

## 2015-05-16 LAB — CBC WITH DIFFERENTIAL/PLATELET
Basophils Absolute: 0 10*3/uL (ref 0.0–0.1)
Basophils Relative: 0 % (ref 0–1)
Eosinophils Absolute: 0.3 10*3/uL (ref 0.0–0.7)
Eosinophils Relative: 5 % (ref 0–5)
HCT: 39.6 % (ref 36.0–46.0)
Hemoglobin: 13 g/dL (ref 12.0–15.0)
LYMPHS ABS: 0.7 10*3/uL (ref 0.7–4.0)
Lymphocytes Relative: 13 % (ref 12–46)
MCH: 32.7 pg (ref 26.0–34.0)
MCHC: 32.8 g/dL (ref 30.0–36.0)
MCV: 99.5 fL (ref 78.0–100.0)
MONOS PCT: 12 % (ref 3–12)
Monocytes Absolute: 0.7 10*3/uL (ref 0.1–1.0)
Neutro Abs: 3.7 10*3/uL (ref 1.7–7.7)
Neutrophils Relative %: 70 % (ref 43–77)
Platelets: 182 10*3/uL (ref 150–400)
RBC: 3.98 MIL/uL (ref 3.87–5.11)
RDW: 12.5 % (ref 11.5–15.5)
WBC: 5.3 10*3/uL (ref 4.0–10.5)

## 2015-05-27 DIAGNOSIS — C3432 Malignant neoplasm of lower lobe, left bronchus or lung: Secondary | ICD-10-CM | POA: Diagnosis not present

## 2015-05-27 LAB — BASIC METABOLIC PANEL
Anion gap: 5 (ref 5–15)
BUN: 20 mg/dL (ref 6–20)
CO2: 28 mmol/L (ref 22–32)
CREATININE: 1.59 mg/dL — AB (ref 0.44–1.00)
Calcium: 9 mg/dL (ref 8.9–10.3)
Chloride: 108 mmol/L (ref 101–111)
GFR calc non Af Amer: 31 mL/min — ABNORMAL LOW (ref >60)
GFR, EST AFRICAN AMERICAN: 36 mL/min — AB (ref >60)
Glucose, Bld: 98 mg/dL (ref 65–99)
POTASSIUM: 4.6 mmol/L (ref 3.5–5.1)
Sodium: 141 mmol/L (ref 135–145)

## 2015-05-27 LAB — CBC WITH DIFFERENTIAL/PLATELET
BASOS ABS: 0 10*3/uL (ref 0.0–0.1)
Basophils Relative: 1 % (ref 0–1)
EOS PCT: 7 % — AB (ref 0–5)
Eosinophils Absolute: 0.4 10*3/uL (ref 0.0–0.7)
HEMATOCRIT: 37.5 % (ref 36.0–46.0)
Hemoglobin: 12.5 g/dL (ref 12.0–15.0)
LYMPHS ABS: 1 10*3/uL (ref 0.7–4.0)
LYMPHS PCT: 19 % (ref 12–46)
MCH: 32.9 pg (ref 26.0–34.0)
MCHC: 33.3 g/dL (ref 30.0–36.0)
MCV: 98.7 fL (ref 78.0–100.0)
Monocytes Absolute: 0.5 10*3/uL (ref 0.1–1.0)
Monocytes Relative: 10 % (ref 3–12)
NEUTROS PCT: 63 % (ref 43–77)
Neutro Abs: 3.3 10*3/uL (ref 1.7–7.7)
PLATELETS: 150 10*3/uL (ref 150–400)
RBC: 3.8 MIL/uL — ABNORMAL LOW (ref 3.87–5.11)
RDW: 12.4 % (ref 11.5–15.5)
WBC: 5.2 10*3/uL (ref 4.0–10.5)

## 2015-06-01 ENCOUNTER — Other Ambulatory Visit: Payer: Self-pay | Admitting: Internal Medicine

## 2015-06-01 ENCOUNTER — Ambulatory Visit (HOSPITAL_COMMUNITY)
Admission: RE | Admit: 2015-06-01 | Discharge: 2015-06-01 | Disposition: A | Discharge status: Home / Self Care | Payer: Medicare Other | Source: Ambulatory Visit | Attending: Internal Medicine | Admitting: Internal Medicine

## 2015-06-01 DIAGNOSIS — Z85118 Personal history of other malignant neoplasm of bronchus and lung: Secondary | ICD-10-CM | POA: Diagnosis not present

## 2015-06-01 DIAGNOSIS — M898X5 Other specified disorders of bone, thigh: Secondary | ICD-10-CM

## 2015-06-01 DIAGNOSIS — M79604 Pain in right leg: Secondary | ICD-10-CM

## 2015-06-01 DIAGNOSIS — M899 Disorder of bone, unspecified: Secondary | ICD-10-CM | POA: Insufficient documentation

## 2015-06-03 ENCOUNTER — Encounter (HOSPITAL_COMMUNITY)
Admission: AD | Admit: 2015-06-03 | Discharge: 2015-06-03 | Disposition: A | Discharge status: Home / Self Care | Payer: Medicare Other | Source: Skilled Nursing Facility | Attending: Internal Medicine | Admitting: Internal Medicine

## 2015-06-03 DIAGNOSIS — I5032 Chronic diastolic (congestive) heart failure: Secondary | ICD-10-CM | POA: Diagnosis present

## 2015-06-03 DIAGNOSIS — M869 Osteomyelitis, unspecified: Secondary | ICD-10-CM | POA: Diagnosis not present

## 2015-06-03 DIAGNOSIS — A4902 Methicillin resistant Staphylococcus aureus infection, unspecified site: Secondary | ICD-10-CM | POA: Insufficient documentation

## 2015-06-03 LAB — CBC WITH DIFFERENTIAL/PLATELET
BASOS ABS: 0 10*3/uL (ref 0.0–0.1)
BASOS PCT: 1 % (ref 0–1)
EOS ABS: 0.3 10*3/uL (ref 0.0–0.7)
Eosinophils Relative: 6 % — ABNORMAL HIGH (ref 0–5)
HEMATOCRIT: 37.4 % (ref 36.0–46.0)
Hemoglobin: 12 g/dL (ref 12.0–15.0)
Lymphocytes Relative: 24 % (ref 12–46)
Lymphs Abs: 1.2 10*3/uL (ref 0.7–4.0)
MCH: 31.7 pg (ref 26.0–34.0)
MCHC: 32.1 g/dL (ref 30.0–36.0)
MCV: 98.9 fL (ref 78.0–100.0)
MONOS PCT: 12 % (ref 3–12)
Monocytes Absolute: 0.6 10*3/uL (ref 0.1–1.0)
Neutro Abs: 2.9 10*3/uL (ref 1.7–7.7)
Neutrophils Relative %: 57 % (ref 43–77)
Platelets: 155 10*3/uL (ref 150–400)
RBC: 3.78 MIL/uL — AB (ref 3.87–5.11)
RDW: 12.4 % (ref 11.5–15.5)
WBC: 5.1 10*3/uL (ref 4.0–10.5)

## 2015-06-03 LAB — BASIC METABOLIC PANEL
Anion gap: 6 (ref 5–15)
BUN: 21 mg/dL — ABNORMAL HIGH (ref 6–20)
CHLORIDE: 107 mmol/L (ref 101–111)
CO2: 27 mmol/L (ref 22–32)
Calcium: 8.9 mg/dL (ref 8.9–10.3)
Creatinine, Ser: 1.52 mg/dL — ABNORMAL HIGH (ref 0.44–1.00)
GFR calc Af Amer: 38 mL/min — ABNORMAL LOW (ref >60)
GFR, EST NON AFRICAN AMERICAN: 33 mL/min — AB (ref >60)
Glucose, Bld: 93 mg/dL (ref 65–99)
Potassium: 4.6 mmol/L (ref 3.5–5.1)
Sodium: 140 mmol/L (ref 135–145)

## 2015-06-10 ENCOUNTER — Encounter (HOSPITAL_COMMUNITY)
Admission: AD | Admit: 2015-06-10 | Discharge: 2015-06-10 | Disposition: A | Discharge status: Home / Self Care | Payer: Medicare Other | Source: Skilled Nursing Facility | Attending: Internal Medicine | Admitting: Internal Medicine

## 2015-06-10 DIAGNOSIS — I5032 Chronic diastolic (congestive) heart failure: Secondary | ICD-10-CM | POA: Diagnosis not present

## 2015-06-10 LAB — BASIC METABOLIC PANEL
Anion gap: 9 (ref 5–15)
BUN: 17 mg/dL (ref 6–20)
CO2: 28 mmol/L (ref 22–32)
Calcium: 9.2 mg/dL (ref 8.9–10.3)
Chloride: 104 mmol/L (ref 101–111)
Creatinine, Ser: 1.48 mg/dL — ABNORMAL HIGH (ref 0.44–1.00)
GFR calc Af Amer: 39 mL/min — ABNORMAL LOW (ref >60)
GFR calc non Af Amer: 34 mL/min — ABNORMAL LOW (ref >60)
GLUCOSE: 101 mg/dL — AB (ref 65–99)
POTASSIUM: 4.6 mmol/L (ref 3.5–5.1)
SODIUM: 141 mmol/L (ref 135–145)

## 2015-06-10 LAB — CBC WITH DIFFERENTIAL/PLATELET
Basophils Absolute: 0 10*3/uL (ref 0.0–0.1)
Basophils Relative: 0 % (ref 0–1)
EOS ABS: 0.3 10*3/uL (ref 0.0–0.7)
EOS PCT: 6 % — AB (ref 0–5)
HCT: 37.6 % (ref 36.0–46.0)
HEMOGLOBIN: 12.3 g/dL (ref 12.0–15.0)
LYMPHS PCT: 21 % (ref 12–46)
Lymphs Abs: 0.9 10*3/uL (ref 0.7–4.0)
MCH: 32.4 pg (ref 26.0–34.0)
MCHC: 32.7 g/dL (ref 30.0–36.0)
MCV: 98.9 fL (ref 78.0–100.0)
MONO ABS: 0.5 10*3/uL (ref 0.1–1.0)
Monocytes Relative: 12 % (ref 3–12)
NEUTROS ABS: 2.7 10*3/uL (ref 1.7–7.7)
Neutrophils Relative %: 61 % (ref 43–77)
Platelets: 150 10*3/uL (ref 150–400)
RBC: 3.8 MIL/uL — ABNORMAL LOW (ref 3.87–5.11)
RDW: 12.4 % (ref 11.5–15.5)
WBC: 4.4 10*3/uL (ref 4.0–10.5)

## 2015-07-21 ENCOUNTER — Encounter (HOSPITAL_COMMUNITY)
Admission: AD | Admit: 2015-07-21 | Discharge: 2015-07-21 | Disposition: A | Discharge status: Home / Self Care | Payer: Medicare Other | Source: Skilled Nursing Facility | Attending: Internal Medicine | Admitting: Internal Medicine

## 2015-07-21 ENCOUNTER — Encounter: Payer: Self-pay | Admitting: Internal Medicine

## 2015-07-21 ENCOUNTER — Non-Acute Institutional Stay (SKILLED_NURSING_FACILITY): Payer: Medicare Other | Admitting: Internal Medicine

## 2015-07-21 DIAGNOSIS — R079 Chest pain, unspecified: Secondary | ICD-10-CM | POA: Insufficient documentation

## 2015-07-21 DIAGNOSIS — M545 Low back pain: Secondary | ICD-10-CM | POA: Diagnosis not present

## 2015-07-21 DIAGNOSIS — N179 Acute kidney failure, unspecified: Secondary | ICD-10-CM | POA: Diagnosis not present

## 2015-07-21 DIAGNOSIS — I10 Essential (primary) hypertension: Secondary | ICD-10-CM

## 2015-07-21 DIAGNOSIS — I69322 Dysarthria following cerebral infarction: Secondary | ICD-10-CM | POA: Diagnosis not present

## 2015-07-21 DIAGNOSIS — N189 Chronic kidney disease, unspecified: Secondary | ICD-10-CM

## 2015-07-21 DIAGNOSIS — Z8673 Personal history of transient ischemic attack (TIA), and cerebral infarction without residual deficits: Secondary | ICD-10-CM

## 2015-07-21 LAB — URINE MICROSCOPIC-ADD ON

## 2015-07-21 LAB — URINALYSIS, ROUTINE W REFLEX MICROSCOPIC
BILIRUBIN URINE: NEGATIVE
Glucose, UA: NEGATIVE mg/dL
KETONES UR: NEGATIVE mg/dL
NITRITE: NEGATIVE
Specific Gravity, Urine: >1.03 — ABNORMAL HIGH (ref 1.005–1.030)
UROBILINOGEN UA: 0.2 mg/dL (ref 0.0–1.0)
pH: 5.5 (ref 5.0–8.0)

## 2015-07-21 NOTE — Progress Notes (Signed)
Patient ID: Stephanie [name], female   DOB: [birth year], 74 y.o.   MRN: [id number]         this is a routine-  visit.  Level care skilled.  Facility [facility].   CHIEF COMPLAINT: Medical management of CVA late effect-hypertension-depression-CHF-a   HISTORY OF PRESENT ILLNESS:  This is a patient who came to [place] on 10/19/2014.  She had been on [facility] with shortness of breath.    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    She also has diastolic heart failure.    I She appears to be quite stable has gained strength during her stay here.  She did have an ER visit earlier this year for altered mental status although no acute etiology was really found she was thought to be mildly dehydrated.  She also went to the ER  a while later-apparently she was brought by family member after complaining of severe back pain-it appears they did a fairly extensive workup in the ER labs were not really concerning a CT scan of the abdomen apparently showed gallstones but it was thought this was not causing her symptoms.  It was thought she may have a UTI per urinalysis results showing bacteria and elevated WBCs and leukocytes.  She was empirically placed on Keflex  However her urine culture ultimately did not appear to be concerning  She was given Ultram for pain.   Her family thinks she may have another UTI apparently she is complaining of some intermittent back pain-her vital signs appear to be stable-they have also noticed that she is crying more lately and they were wondering if her depression medicine which is Celexa needs to be changed-she is followed by psychiatric services and will have them follow up  Her weights appear to be  stable -- she does not have any increased edema from baseline she had been on Lasix at one point for history CHF but I do not see that she is on this anymore nonetheless she appears to be stable in this regards.  She does have baseline renal insufficiency  Creatinine last month was 1.48 with BUN of 17 this is relatively baseline  She does have a history of CVA with some right-sided weakness and dysarthria-she is on aspirin for anticoagulation-she does ambulate about facility in the wheelchair.         Family medical social history reviewed  per admission note 10/19/2014. and progress note on 04/14/2015  Medications have been reviewed per [facility] Atenolol 25 mg daily hold for pulse less than 60.  Aspirin 81 mg daily.  Celexa 40 mg daily.  Irresa 250 mg every morning    [name] when necessary.  Robitussin 5 mL every 4 hours when necessary cough.  Senokot when necessary.  Doxepin 3 mg daily at bedtime.  Tylenol 650 mg every 4 hours when necessary.  Zocor 20 mg daily at bedtime    Review of systems-somewhat limited secondary dysarthria obtained  from patient and nursing  In general no complaints of fever or chills  Skin no recent rashes have been noted.  Respiratory does not complain of cough or shortness of breath.  Head ears eyes nose mouth and throat does not complaining of sore  throat or visual changes  Respiratory no complaints of cough or shortness of breath.  Cardiac no chest pain minimal lower extremity edema.  GI does not complain of any nausea vomiting diarrhea constipation or abdominal discomfort.  GU-family states she is having some intermittent back pain. Thinks she may have a UTI  Muscle skeletal does not complain of joint pain or back pain this afternoon.  Neurologic is not complaining of dizziness or headache does have right-sided weakness with history of CVA   PHYSICAL EXAMINATION     She is afebrile pulse of 64 respirations 18 blood  pressure 100/50-122/62 most recently weight is stable at 153.6  In general this is a pleasant elderly female sitting comfortably in her wheelchair  Her skin is warm and dry.--   Eyes sclera and conjunctiva are clear visual acuity appears grossly intact.  Nose  did not appreciate any active drainage.  Oropharynx is clear mucous membranes moist.  :   CHEST/RESPIRATORY:  Shallow, but otherwise clear air entry bilaterally.   CARDIOVASCULAR:  CARDIAC:  Regular  Rate and rhythm  She appears to be euvolemic-- there is minimal lower extremity edema.--She has baseline varicosities   Musculoskeletal-again right-sided weakness which is chronic status post CVA-  Abdomen is soft nontender with positive bowel sounds   GU cannot really appreciate overt suprapubic tenderness but patient does have dysarthric speech and is somewhat a poor historian NEUROLOGICAL:    CRANIAL NERVES:  She has a cortically-based language problem, but can converse.   SENSATION/STRENGTH:  She has right upper extremity greater than right lower extremity weakness-this is baseline.  Musculoskeletal-as noted above does have baseline neurologic deficits-does not appear to have acute back pain with tenderness to palpation but does appear to have some CV tenderness,   Labs  06/10/2015.  WBC 4.4 hemoglobin 12.3 platelets 150.  Sodium 141 potassium 4.6 BUN 17 creatinine 1.48  04/15/2015.  Cholesterol 133 triglycerides 64 HDL 49 LDL 71.  04/11/2015.  Sodium 139 potassium 4 BUN 27 creatinine 1.5.  Liver function tests within normal limits except ALT of 11.  WBC 6.8 hemoglobin 12.9 platelets 188.    03/06/2015.  Sodium 139 potassium 4.6 BUN 22 creatinine 1.55.  Liver function tests within normal limits.  WBC 5.4 hemoglobin 12.5 platelets 150.  02/18/2015.  Sodium 138 potassium 4.2 BUN 32 creatinine 1.78  12/03/2014.  Sodium 138 potassium 4.4 BUN 19 creatinine 1.39.  11/19/2014.  WBC 6.3 hemoglobin  13.9 platelets 216.      ASSESSMENT/PLAN:                              Late-effect dominant hemisphere CVA.  Probably this is fairly distant she appears to have gained some strength she is on aspirin for anticoagulation  --she is also on a statin-recent lipid panel appears to be unremarkable LDL 71  Diastolic heart failure.  At this point this appears stable--no longer onLasix  nonetheless her weights appear to be stable clinically appears to be stable--appears to have some element of renal insufficiency with baseline creatinine appearing to run in the mid 1's area-- we will update this.    History of adeno-CA of the lung. She is followed by oncology per consultation with family  discontinued the Tarceva. She is now on Iressa and appears to be tolerating this well-  .  Anemia.  She has  Had  an OB-positive stool, but hemoglobin shows stability we will update this  History  of renal insufficiency this appears to be  Fairly  stable  --will update this    History of hypertension this appears stable she is on atenolol there are orders to hold this for pulse less than 60.--Recent blood pressures ranged from 100/50-124/64-122/62                                                             Depression--per family she is crying more-she appeared to be at baseline when I examined her apparently there are episodes of sadness and crying although cannot really trace a specific etiology for her feeling this way-- will have this followed by psychiatric services this was discussed with her daughter at bedside and she is in agreement with this.--She continues on Celexa 40 mg a day.    Intermittent back pain what appears to be some CV tenderness-will order a UA CNS and await results-she has had a CT scan back in October last year which showed degenerative disc disease and facet changes lumbar spine as well as scoliosis-and L1 compression endplate deformity did not show any acute changes  She is receiving  Tylenolthis will have to be monitored for effectiveness       [phone]

## 2015-07-22 ENCOUNTER — Encounter (HOSPITAL_COMMUNITY)
Admission: AD | Admit: 2015-07-22 | Discharge: 2015-07-22 | Disposition: A | Discharge status: Home / Self Care | Payer: Medicare Other | Source: Skilled Nursing Facility | Attending: Internal Medicine | Admitting: Internal Medicine

## 2015-07-22 ENCOUNTER — Non-Acute Institutional Stay (SKILLED_NURSING_FACILITY): Payer: Medicare Other | Admitting: Internal Medicine

## 2015-07-22 ENCOUNTER — Encounter: Payer: Self-pay | Admitting: Internal Medicine

## 2015-07-22 DIAGNOSIS — R079 Chest pain, unspecified: Secondary | ICD-10-CM | POA: Diagnosis not present

## 2015-07-22 DIAGNOSIS — M545 Low back pain, unspecified: Secondary | ICD-10-CM | POA: Insufficient documentation

## 2015-07-22 LAB — COMPREHENSIVE METABOLIC PANEL
ALBUMIN: 3.6 g/dL (ref 3.5–5.0)
ALK PHOS: 78 U/L (ref 38–126)
ALT: 17 U/L (ref 14–54)
AST: 29 U/L (ref 15–41)
Anion gap: 6 (ref 5–15)
BUN: 19 mg/dL (ref 6–20)
CALCIUM: 8.9 mg/dL (ref 8.9–10.3)
CO2: 28 mmol/L (ref 22–32)
CREATININE: 1.38 mg/dL — AB (ref 0.44–1.00)
Chloride: 107 mmol/L (ref 101–111)
GFR calc Af Amer: 43 mL/min — ABNORMAL LOW (ref >60)
GFR calc non Af Amer: 37 mL/min — ABNORMAL LOW (ref >60)
GLUCOSE: 104 mg/dL — AB (ref 65–99)
Potassium: 4.1 mmol/L (ref 3.5–5.1)
SODIUM: 141 mmol/L (ref 135–145)
Total Bilirubin: 0.6 mg/dL (ref 0.3–1.2)
Total Protein: 7.4 g/dL (ref 6.5–8.1)

## 2015-07-22 LAB — CBC WITH DIFFERENTIAL/PLATELET
BASOS PCT: 0 %
Basophils Absolute: 0 10*3/uL (ref 0.0–0.1)
EOS ABS: 0.3 10*3/uL (ref 0.0–0.7)
Eosinophils Relative: 5 %
HCT: 41.4 % (ref 36.0–46.0)
Hemoglobin: 13.6 g/dL (ref 12.0–15.0)
Lymphocytes Relative: 21 %
Lymphs Abs: 1.2 10*3/uL (ref 0.7–4.0)
MCH: 32.6 pg (ref 26.0–34.0)
MCHC: 32.9 g/dL (ref 30.0–36.0)
MCV: 99.3 fL (ref 78.0–100.0)
MONO ABS: 0.4 10*3/uL (ref 0.1–1.0)
MONOS PCT: 8 %
Neutro Abs: 3.5 10*3/uL (ref 1.7–7.7)
Neutrophils Relative %: 66 %
Platelets: 181 10*3/uL (ref 150–400)
RBC: 4.17 MIL/uL (ref 3.87–5.11)
RDW: 12.3 % (ref 11.5–15.5)
WBC: 5.4 10*3/uL (ref 4.0–10.5)

## 2015-07-22 NOTE — Progress Notes (Signed)
Patient ID: Stephanie [name], female   DOB: [birth year], 74 y.o.   MRN: [id number]       this is an acute-  visit.  Level care skilled.  Facility [facility].   CHIEF COMPLAINT: Acute visit to follow-up back pain   HISTORY OF PRESENT ILLNESS:  Patient is a pleasant [facility] resident was sore yesterday for routine visit-she was having some intermittent back pain in more crying episodes family was concerned about increasing depression and a psychiatric consult is pending my understanding is he psychiatric nurse practitioner will be here tomorrow.  Patient is on 40 mg of Celexa.  The urine culture also is pending the results are not available yet--urinalysis shows negative nitrites trace leukocytes many WBC and bacteria she is afebrile.  We did obtain lab work her white count is within normal range at 5.4.  Patient is receiving Tylenol as needed for back pain family thinks that she would benefit from something stronger-appears at one point she had been on tramadol but apparently this is not on her medication list per review today.  I did discuss this with her daughter via phone and will start this when necessary and see if this helps.  Also will obtain a repeat back x-ray she did have a CT scan done late last year which showed degenerative changes and compression deformity lumbar area and did not show any acute changes or process   .             Family medical social history reviewed  per admission note 10/19/2014. and progress note on 04/14/2015  Medications have been reviewed per [facility] Atenolol 25 mg daily hold for pulse less than 60.  Aspirin 81 mg daily.  Celexa 40 mg daily.  Irresa 250 mg every morning    [name] when necessary.  Robitussin 5 mL every 4 hours when necessary cough.  Senokot when necessary.  Doxepin 3 mg daily at bedtime.  Tylenol 650 mg every 4 hours when necessary.  Zocor 20 mg daily at bedtime    Review of systems-somewhat limited secondary  dysarthria obtained  from patient and nursing  In general no complaints of fever or chills  Skin no recent rashes have been noted.  Respiratory does not complain of cough or shortness of breath.     Respiratory no complaints of cough or shortness of breath.  Cardiac no chest pain minimal lower extremity edema.  GI does not complain of any nausea vomiting diarrhea constipation or abdominal discomfort.  GU-family states she is having somet back pain. Thinks she may have a UTI  Muscle skeletal per family is complaining of back pain today  Neurologic is not complaining of dizziness or headache does have right-sided weakness with history of CVA   PHYSICAL EXAMINATION  Is afebrile pulse of 58 blood pressure 100/50     In general this is a pleasant elderly female sitting comfortably in her wheelchair  Her skin is warm and dry.--   .  Oropharynx is clear mucous membranes moist.  :   CHEST/RESPIRATORY:  Shallow, but otherwise clear air entry bilaterally.   CARDIOVASCULAR:  CARDIAC:  Regular  Rate and rhythm lightly bradycardic  She appears to be euvolemic-- there is minimal lower extremity edema.--She has baseline varicosities   Musculoskeletal-again right-sided weakness which is chronic status post CVA-appears possibly to have some tenderness to palpation again of her lumbar area this is somewhat similar T yesterday per family tfeesl this is somewhat persistent  Abdomen is soft nontender  with positive bowel sounds   GU cannot really appreciate overt suprapubic tenderness but patient does have dysarthric speech and is somewhat a poor historian  .  Musculoskeletal-as noted above does have baseline neurologic deficits-does not appear to have acute back pain with tenderness to palpation but does appear to have some discomfort ,   Labs  07/22/2015.  WBC 5.4 hemoglobin 13.6 platelets 181.  Sodium 141 potassium 4.1 BUN 19 creatinine 1.38-liver function tests within  normal limits  06/10/2015.  WBC 4.4 hemoglobin 12.3 platelets 150.  Sodium 141 potassium 4.6 BUN 17 creatinine 1.48  04/15/2015.  Cholesterol 133 triglycerides 64 HDL 49 LDL 71.  04/11/2015.  Sodium 139 potassium 4 BUN 27 creatinine 1.5.  Liver function tests within normal limits except ALT of 11.  WBC 6.8 hemoglobin 12.9 platelets 188.    03/06/2015.  Sodium 139 potassium 4.6 BUN 22 creatinine 1.55.  Liver function tests within normal limits.  WBC 5.4 hemoglobin 12.5 platelets 150.  02/18/2015.  Sodium 138 potassium 4.2 BUN 32 creatinine 1.78  12/03/2014.  Sodium 138 potassium 4.4 BUN 19 creatinine 1.39.  11/19/2014.  WBC 6.3 hemoglobin 13.9 platelets 216.      ASSESSMENT/PLAN:                              Back pain-I did discuss this with her daughter via phone will update an x-ray of her lumbar spine-also will add the tramadol 25 mg every 6 hours when necessary pain according family Tylenol is not totally effective this will have to be encouraged--.  Also will await culture results for possible UTI continue to monitor  Renal insufficiency this appears to be stable with a creatinine of 1.38 BUN of 19.  [id number]

## 2015-07-23 ENCOUNTER — Ambulatory Visit (HOSPITAL_COMMUNITY)
Admission: RE | Admit: 2015-07-23 | Discharge: 2015-07-23 | Disposition: A | Discharge status: Home / Self Care | Payer: Medicare Other | Source: Ambulatory Visit | Attending: Internal Medicine | Admitting: Internal Medicine

## 2015-07-23 ENCOUNTER — Encounter (HOSPITAL_COMMUNITY)
Admission: AD | Admit: 2015-07-23 | Discharge: 2015-07-23 | Disposition: A | Discharge status: Home / Self Care | Payer: Medicare Other | Source: Skilled Nursing Facility | Attending: Internal Medicine | Admitting: Internal Medicine

## 2015-07-23 ENCOUNTER — Other Ambulatory Visit: Payer: Self-pay | Admitting: Internal Medicine

## 2015-07-23 DIAGNOSIS — M549 Dorsalgia, unspecified: Secondary | ICD-10-CM

## 2015-07-23 DIAGNOSIS — J984 Other disorders of lung: Secondary | ICD-10-CM | POA: Insufficient documentation

## 2015-07-23 DIAGNOSIS — M545 Low back pain: Secondary | ICD-10-CM | POA: Insufficient documentation

## 2015-07-23 DIAGNOSIS — M546 Pain in thoracic spine: Secondary | ICD-10-CM | POA: Insufficient documentation

## 2015-07-23 DIAGNOSIS — M8588 Other specified disorders of bone density and structure, other site: Secondary | ICD-10-CM | POA: Diagnosis not present

## 2015-07-23 DIAGNOSIS — M47814 Spondylosis without myelopathy or radiculopathy, thoracic region: Secondary | ICD-10-CM | POA: Diagnosis not present

## 2015-07-23 DIAGNOSIS — R079 Chest pain, unspecified: Secondary | ICD-10-CM | POA: Diagnosis not present

## 2015-07-23 LAB — CBC WITH DIFFERENTIAL/PLATELET
Basophils Absolute: 0 10*3/uL (ref 0.0–0.1)
Basophils Relative: 1 %
EOS PCT: 7 %
Eosinophils Absolute: 0.3 10*3/uL (ref 0.0–0.7)
HCT: 38.1 % (ref 36.0–46.0)
Hemoglobin: 12.6 g/dL (ref 12.0–15.0)
LYMPHS ABS: 1 10*3/uL (ref 0.7–4.0)
LYMPHS PCT: 24 %
MCH: 32.7 pg (ref 26.0–34.0)
MCHC: 33.1 g/dL (ref 30.0–36.0)
MCV: 99 fL (ref 78.0–100.0)
MONO ABS: 0.4 10*3/uL (ref 0.1–1.0)
MONOS PCT: 10 %
Neutro Abs: 2.4 10*3/uL (ref 1.7–7.7)
Neutrophils Relative %: 58 %
PLATELETS: 154 10*3/uL (ref 150–400)
RBC: 3.85 MIL/uL — AB (ref 3.87–5.11)
RDW: 12.3 % (ref 11.5–15.5)
WBC: 4.2 10*3/uL (ref 4.0–10.5)

## 2015-07-23 LAB — COMPREHENSIVE METABOLIC PANEL
ALBUMIN: 3.2 g/dL — AB (ref 3.5–5.0)
ALT: 15 U/L (ref 14–54)
AST: 25 U/L (ref 15–41)
Alkaline Phosphatase: 71 U/L (ref 38–126)
BUN: 18 mg/dL (ref 6–20)
CHLORIDE: 107 mmol/L (ref 101–111)
CO2: 29 mmol/L (ref 22–32)
Calcium: 8.6 mg/dL — ABNORMAL LOW (ref 8.9–10.3)
Creatinine, Ser: 1.43 mg/dL — ABNORMAL HIGH (ref 0.44–1.00)
GFR calc Af Amer: 41 mL/min — ABNORMAL LOW (ref >60)
GFR calc non Af Amer: 35 mL/min — ABNORMAL LOW (ref >60)
GLUCOSE: 98 mg/dL (ref 65–99)
POTASSIUM: 4.5 mmol/L (ref 3.5–5.1)
SODIUM: 137 mmol/L (ref 135–145)
Total Bilirubin: 0.5 mg/dL (ref 0.3–1.2)
Total Protein: 6.6 g/dL (ref 6.5–8.1)

## 2015-07-23 LAB — URINE CULTURE

## 2015-07-24 ENCOUNTER — Non-Acute Institutional Stay (SKILLED_NURSING_FACILITY): Payer: Medicare Other | Admitting: Internal Medicine

## 2015-07-24 DIAGNOSIS — R300 Dysuria: Secondary | ICD-10-CM

## 2015-07-24 DIAGNOSIS — M549 Dorsalgia, unspecified: Secondary | ICD-10-CM

## 2015-07-25 NOTE — Progress Notes (Signed)
Patient ID: Stephanie [name], female   DOB: [birth year], 74 y.o.   MRN: [id number]        this is an acute-  visit.  Level care skilled.  Facility [facility].   CHIEF COMPLAINT: Acute visit to follow-up back pain-urine culture results-   HISTORY OF PRESENT ILLNESS:  Patient is a pleasant  resident was seen recently for routine visit-she was having some intermittent back pain in more crying episodes family was concerned about increasing depression and a psychiatric consult i Was ordered which actually was completed yesterday and she has been started on Cymbalta and the psychiatric nurse practitioner is titrating down her Celexa  Patientwass on 40 mg of Celexa.  Urine culture also will obtain secondary family concerns that she may UTI with some increased back pain-urine culture has grown out multiple pathogens none predominant.  She has been afebrile.  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             Family medical social history reviewed  per admission note 10/19/2014. and progress note on 9/21//2016  Medications have been reviewed per [facility] Atenolol 25 mg daily hold for pulse less than 60.  Aspirin 81 mg daily.  Celexa 40 mg daily this is being titrated down and she has been started on Cymbalta by psychiatric nurse practitioner.  Irresa 250 mg every morning    [name] when necessary.  Robitussin 5 mL every 4 hours when necessary cough.  Senokot when necessary.  Doxepin 3  mg daily at bedtime.  Tylenol 650 mg every 4 hours when necessary  She is now on low-dose tramadol as well when necessary 25 mg every 6 hours.  Zocor 20 mg daily at bedtime    Review of systems-somewhat limited secondary dysarthria obtained  from patient and nursing  In general no complaints of fever or chills  Skin no recent rashes have been noted.  Respiratory does not complain of cough or shortness of breath.     Respiratory no complaints of cough or shortness of breath.  Cardiac no chest pain minimal lower extremity edema.  GI does not complain of any nausea vomiting diarrhea constipation or abdominal discomfort.  GU-appears to  possibly have some suprapubic tenderness on exam today  Muscle skeletal appears back pain may be somewhat improved although this is difficult to fully tell patient is aphasic  Neurologic is not complaining of dizziness or headache does have right-sided weakness with history of CVA   PHYSICAL EXAMINATION  Is afebrile pulse is 62 respirations of 19     In general this is a pleasant elderly female sitting comfortably in her wheelchair  Her skin is warm and dry.--   .  Oropharynx is clear mucous membranes moist.  :   CHEST/RESPIRATORY:  Shallow, but otherwise clear air entry bilaterally.   CARDIOVASCULAR:  CARDIAC:  Regular  Rate and rhythm l  She appears to be euvolemic-- there is minimal lower extremity edema.--She has baseline varicosities   Musculoskeletal-again right-sided weakness which is  chronic status post CVA-appears possibly to have some tenderness to palpation again of her lumbar area    Abdomen is soft nontender with positive bowel sounds   GU  Appear to possibly have some mild grimacing with palpation of her suprapubic area again this is somewhat difficult to tell with patient's aphasic status  .    ,   Labs  07/22/2015.  WBC 5.4 hemoglobin 13.6 platelets 181.  Sodium 141 potassium 4.1 BUN 19 creatinine  1.38-liver function tests within normal limits  06/10/2015.  WBC 4.4 hemoglobin 12.3 platelets 150.  Sodium 141 potassium 4.6 BUN 17 creatinine 1.48  04/15/2015.  Cholesterol 133 triglycerides 64 HDL 49 LDL 71.  04/11/2015.  Sodium 139 potassium 4 BUN 27 creatinine 1.5.  Liver function tests within normal limits except ALT of 11.  WBC 6.8 hemoglobin 12.9 platelets 188.    03/06/2015.  Sodium 139 potassium 4.6 BUN 22 creatinine 1.55.  Liver function tests within normal limits.  WBC 5.4 hemoglobin 12.5 platelets 150.  02/18/2015.  Sodium 138 potassium 4.2 BUN 32 creatinine 1.78  12/03/2014.  Sodium 138 potassium 4.4 BUN 19 creatinine 1.39.  11/19/2014.  WBC 6.3 hemoglobin 13.9 platelets 216.      ASSESSMENT/PLAN:                              Back pain- This may be improving with the tramadol will have to be watched-x-ray did not show any acute process at this point monitor.  Question UTI-urine culture did not grow any specific organism however since she appears to possibly have some suprapubic tenderness will reobtain a culture and monitor.  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     [id number]

## 2015-07-26 ENCOUNTER — Encounter (HOSPITAL_COMMUNITY)
Admission: AD | Admit: 2015-07-26 | Discharge: 2015-07-26 | Disposition: A | Discharge status: Home / Self Care | Payer: Medicare Other | Source: Skilled Nursing Facility | Attending: Internal Medicine | Admitting: Internal Medicine

## 2015-07-26 DIAGNOSIS — R079 Chest pain, unspecified: Secondary | ICD-10-CM | POA: Diagnosis not present

## 2015-07-26 LAB — URINALYSIS, ROUTINE W REFLEX MICROSCOPIC
BILIRUBIN URINE: NEGATIVE
GLUCOSE, UA: NEGATIVE mg/dL
Ketones, ur: NEGATIVE mg/dL
NITRITE: NEGATIVE
PH: 5.5 (ref 5.0–8.0)
Protein, ur: NEGATIVE mg/dL
SPECIFIC GRAVITY, URINE: 1.02 (ref 1.005–1.030)
Urobilinogen, UA: 0.2 mg/dL (ref 0.0–1.0)

## 2015-07-26 LAB — URINE MICROSCOPIC-ADD ON

## 2015-07-28 LAB — URINE CULTURE

## 2015-07-31 ENCOUNTER — Non-Acute Institutional Stay (SKILLED_NURSING_FACILITY): Payer: Medicare Other | Admitting: Internal Medicine

## 2015-07-31 ENCOUNTER — Encounter: Payer: Self-pay | Admitting: Internal Medicine

## 2015-07-31 DIAGNOSIS — R300 Dysuria: Secondary | ICD-10-CM

## 2015-07-31 DIAGNOSIS — L0390 Cellulitis, unspecified: Secondary | ICD-10-CM | POA: Insufficient documentation

## 2015-07-31 DIAGNOSIS — L03113 Cellulitis of right upper limb: Secondary | ICD-10-CM | POA: Diagnosis not present

## 2015-07-31 DIAGNOSIS — M549 Dorsalgia, unspecified: Secondary | ICD-10-CM

## 2015-07-31 NOTE — Progress Notes (Signed)
Patient ID: Stephanie [name], female   DOB: [birth year], 74 y.o.   MRN: [id number]         this is an acute-  visit.  Level care skilled.  Facility [facility].   CHIEF COMPLAINT: Acute visit secondary to right hand erythema question cellulitis--follow-up question dysuria urine culture   HISTORY OF PRESENT ILLNESS:  Patient is a pleasant 74 year old female who-nursing staff has noted some erythema on the back of her right hand and I'm following up on this.  Apparently there's been no recent history of trauma she has been afebrile vital signs are stable.  Apparently there was a skin tear at the site previously  I saw her last week for possible dysuria and back pain follow-up-she is on tramadol now which appears to be helping-urine culture I did review which has not grown out any specific Olena Mater has been afebrile is not really complaining of dysuria today     Family medical social history reviewed  per admission note 10/19/2014.   Medications have been reviewed per [facility] Atenolol 25 mg daily hold for pulse less than 60.  Aspirin 81 mg daily.  Celexa 40 mg daily this is being titrated down and she has been started on Cymbalta by psychiatric nurse practitioner.  Irresa 250 mg every morning    [name] when necessary.  Robitussin 5 mL every 4 hours when necessary cough.  Senokot when necessary.  Doxepin 3 mg daily at bedtime.  Tylenol 650 mg every 4 hours when necessary  She is now on low-dose tramadol as well when necessary 25 mg every 6 hours.  Zocor 20 mg daily at bedtime    Review of systems-somewhat limited secondary dysarthria obtained  from patient and nursing  In general no complaints of fever or chills  Skin no recent rashes have been noted--erythema noted on the back of her right hand.  Respiratory does not complain of cough or shortness of breath.     Respiratory no complaints of cough or shortness of breath.  Cardiac no chest pain minimal  lower extremity edema.  GI does not complain of any nausea vomiting diarrhea constipation or abdominal discomfort.  GU-no recent complaints of dysuria in the last few days apparently     Muscle skeletal appears back pain may be somewhat improved although this is difficult to fully tell patient is aphasic--nursing staff however has not noted this recently as an issue  Neurologic is not complaining of dizziness or headache does have right-sided weakness with history of CVA   PHYSICAL EXAMINATION  Temperature 97.6 pulse 64 respirations 20 blood pressure 138/68     In general this is a pleasant elderly female sitting comfortably in her wheelchair  Her skin is warm and dry.- on the dorsal aspect of her hand covering approximately a half of the area there is an erythematous area-this is not acutely tender to palpation I did not note any deformities of her hand other than arthritic-I do not see any active drainage at this time the area is somewhat warm-   .  Oropharynx is clear mucous membranes moist.  :   CHEST/RESPIRATORY:  Shallow, but otherwise clear air entry bilaterally.   CARDIOVASCULAR:  CARDIAC:  Regular  Rate and rhythm l  She appears to be euvolemic-- there is minimal lower extremity edema.--She has baseline varicosities   Musculoskeletal-again right-sided weakness which is chronic status post CVA-appears possibly to have some tenderness to palpation again of her lumbar area    Abdomen is soft  nontender with positive bowel sounds   GU-does not appear to have specific suprapubic tenderness today.  Logic again right-sided weakness with some aphasia which is baseline.  Psych she is pleasant and cooperative     .    ,   Labs  07/23/2015.  WBC 4.2 hemoglobin 12.6 platelets 154.  Sodium 137 potassium 4.5 BUN 18 creatinine 1.43  07/22/2015.  WBC 5.4 hemoglobin 13.6 platelets 181.  Sodium 141 potassium 4.1 BUN 19 creatinine 1.38-liver function tests within  normal limits  06/10/2015.  WBC 4.4 hemoglobin 12.3 platelets 150.  Sodium 141 potassium 4.6 BUN 17 creatinine 1.48  04/15/2015.  Cholesterol 133 triglycerides 64 HDL 49 LDL 71.  04/11/2015.  Sodium 139 potassium 4 BUN 27 creatinine 1.5.  Liver function tests within normal limits except ALT of 11.  WBC 6.8 hemoglobin 12.9 platelets 188.    03/06/2015.  Sodium 139 potassium 4.6 BUN 22 creatinine 1.55.  Liver function tests within normal limits.  WBC 5.4 hemoglobin 12.5 platelets 150.  02/18/2015.  Sodium 138 potassium 4.2 BUN 32 creatinine 1.78  12/03/2014.  Sodium 138 potassium 4.4 BUN 19 creatinine 1.39.  11/19/2014.  WBC 6.3 hemoglobin 13.9 platelets 216.      ASSESSMENT/PLAN:  Suspected cellulitis right hand--new diagnosis--this appears to have started from a skin tear-will start her on doxycycline 100 mg twice a day for 7 days and monitor-                              Back pain- This may be improving with the tramadol will have to be watched-x-ray did not show any acute process at this point monitor.  Question UTI- I have reviewed the urine culture from a week ago-this did not grow out any specific organism  she has been afebrile is not complaining of dysuria today.  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     [id number]

## 2015-08-12 ENCOUNTER — Other Ambulatory Visit (HOSPITAL_COMMUNITY): Payer: Self-pay

## 2015-08-18 ENCOUNTER — Ambulatory Visit (HOSPITAL_COMMUNITY): Payer: Medicare Other

## 2015-08-18 ENCOUNTER — Encounter (HOSPITAL_COMMUNITY): Payer: Medicare Other | Attending: Internal Medicine

## 2015-08-18 DIAGNOSIS — M869 Osteomyelitis, unspecified: Secondary | ICD-10-CM | POA: Diagnosis not present

## 2015-08-18 DIAGNOSIS — C3412 Malignant neoplasm of upper lobe, left bronchus or lung: Secondary | ICD-10-CM

## 2015-08-18 DIAGNOSIS — C3432 Malignant neoplasm of lower lobe, left bronchus or lung: Secondary | ICD-10-CM | POA: Insufficient documentation

## 2015-08-18 DIAGNOSIS — A4902 Methicillin resistant Staphylococcus aureus infection, unspecified site: Secondary | ICD-10-CM | POA: Insufficient documentation

## 2015-08-18 DIAGNOSIS — C3492 Malignant neoplasm of unspecified part of left bronchus or lung: Secondary | ICD-10-CM

## 2015-08-18 LAB — CBC WITH DIFFERENTIAL/PLATELET
BASOS ABS: 0 10*3/uL (ref 0.0–0.1)
BASOS PCT: 1 %
Eosinophils Absolute: 0.3 10*3/uL (ref 0.0–0.7)
Eosinophils Relative: 5 %
HEMATOCRIT: 40.5 % (ref 36.0–46.0)
HEMOGLOBIN: 13.7 g/dL (ref 12.0–15.0)
LYMPHS PCT: 18 %
Lymphs Abs: 1.2 10*3/uL (ref 0.7–4.0)
MCH: 33.1 pg (ref 26.0–34.0)
MCHC: 33.8 g/dL (ref 30.0–36.0)
MCV: 97.8 fL (ref 78.0–100.0)
MONO ABS: 0.6 10*3/uL (ref 0.1–1.0)
Monocytes Relative: 9 %
NEUTROS ABS: 4.3 10*3/uL (ref 1.7–7.7)
NEUTROS PCT: 67 %
Platelets: 192 10*3/uL (ref 150–400)
RBC: 4.14 MIL/uL (ref 3.87–5.11)
RDW: 12.4 % (ref 11.5–15.5)
WBC: 6.4 10*3/uL (ref 4.0–10.5)

## 2015-08-18 LAB — COMPREHENSIVE METABOLIC PANEL
ALBUMIN: 3.5 g/dL (ref 3.5–5.0)
ALK PHOS: 78 U/L (ref 38–126)
ALT: 16 U/L (ref 14–54)
AST: 26 U/L (ref 15–41)
Anion gap: 5 (ref 5–15)
BILIRUBIN TOTAL: 0.6 mg/dL (ref 0.3–1.2)
BUN: 20 mg/dL (ref 6–20)
CALCIUM: 9.1 mg/dL (ref 8.9–10.3)
CO2: 28 mmol/L (ref 22–32)
CREATININE: 1.4 mg/dL — AB (ref 0.44–1.00)
Chloride: 106 mmol/L (ref 101–111)
GFR calc Af Amer: 42 mL/min — ABNORMAL LOW (ref >60)
GFR, EST NON AFRICAN AMERICAN: 36 mL/min — AB (ref >60)
GLUCOSE: 115 mg/dL — AB (ref 65–99)
POTASSIUM: 4.1 mmol/L (ref 3.5–5.1)
Sodium: 139 mmol/L (ref 135–145)
TOTAL PROTEIN: 7.3 g/dL (ref 6.5–8.1)

## 2015-08-19 NOTE — Assessment & Plan Note (Addendum)
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  She is tolerating Iressa well without any known side effects or toxicities.    She has not been seen in the clinic since June 2016 as a result of "no-show."  She is overdue for restaging CT scans.  I will get her set-up for CT of chest in near future to evaluate response to therapy.  Recent change in medication from Celexa to Cymbalta.  Return in 2-3 weeks for follow-up after CT imaging to review results and review future treatment options.

## 2015-08-19 NOTE — Progress Notes (Signed)
Stephanie Lange, MD [place] [address]  Adenocarcinoma of left lung, stage 1 [facility])  CURRENT THERAPY: Iressa 250 mg daily beginning on 02/12/2014.  INTERVAL HISTORY: Stephanie [name] 74 y.o. female returns for followup of questionable progressive adenocarcinoma of the lung, not biopsy proven. 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    Adenocarcinoma of left lung, stage 1 ([place])   10/19/2011 Imaging PET/CT showing activity in LUL lesion, no mediastinal metastasis. no evidence of distant mestastasis   10/19/2011 Initial Diagnosis Adenocarcinoma with Exon 19 deletion, ALK-   11/08/2011 Surgery Thoracoscopic left upper lobectomy with mediastinal exploration, T2aN0 adenocarcinoma with bronchoalveolar  characteristics,  4.3cm, exon 19 deletion, ALK negative, Dr. Roxan Hockey   07/31/2013 Imaging CT chest- Stable postoperative and post therapy change in the left hemi- thorax. No evidence of lung cancer recurrence.   02/12/2014 Imaging Ct chest- The previously seen pulmonary nodules are stable. However, there appears to be new ground-glass pulmonary nodules in the right upper lobe. These are small and nonspecific. Cannot exclude metastases.   05/26/2014 Imaging CT chest- Right lung nodules are grossly stable from 02/12/2014 but appear new/enlarged from 07/16/2012, raising concern for bronchogenic carcinoma. Pulmonary parenchymal pattern of fibrosis does not appear progressive from 07/16/2012   06/25/2014 - 11/30/2014 Chemotherapy Tarceva 25 mg daily started by Dr. Barnet Glasgow ([facility])   10/16/2014 Imaging CT chest- Stable nodules in right lung. The largest nodule in right lower lobe measures 1.6 x 1.5 cm stable from prior exam. No definite new pulmonary nodules are noted.   12/02/2014 Treatment Plan Change Inappropriate use of Tarceva without biopsy proven disease recurrence and inappropriately low dose.   01/14/2015  Imaging CT chest, Continued slow growth of multiple ground-glass and sub solid right lung nodules compared with older prior studies. Findings remain suspicious for metastatic disease or development of multicentric adenocarcinoma.   02/13/2015 -  Chemotherapy Iressa 250 mg daily.     I personally reviewed and went over laboratory results with the patient.  The results are noted within this dictation.  Labs are very stable.  Chart reviewed.  Stephanie [name] has not been seen since June 2016 as she was a no show for follow-up in June as scheduled.   She was scheduled for restaging CT imaging on 10/18 following her lab work on the same day.  Lab work was completed, but the was "no-showed" by radiology because the patient did not arrive to the radiology department on 10/18.  Unfortunately, it sounds like she was never taken to that appointment by her nursing facility.  She was recently started on Cymbalta from Celexa for episodes of tearfulness.  After discussion with the patient and family, I gather that Stephanie [name] is frustrated that she cannot communicate to her nursing staff effectively when she needs something and this is frustrating.  She has difficulty communicating due to a stroke and she get frustrated when her socks are on incorrectly or her bra is bothering her after care at nursing facility.  She admits she is tolerating therapy well thus far.  She denies any side effects that I reviewed with her.  Past Medical History  Diagnosis Date  . CVA (cerebral infarction)     speech difficulities  . Stroke ([place])   . Hypercholesteremia     Takes Zocor daily  . Hypertension     takes Atenolol and Lisinopril daily  . Lung mass  left upper lobe  . Arthritis     hands  . TIA (transient ischemic attack)     Sat before thanksgiving;impaired speech  . Pneumothorax   . Lung cancer ([place])   . CKD (chronic kidney disease), stage III     has CVA (cerebral infarction); HTN (hypertension); Pneumothorax of left  lung after biopsy; Adenocarcinoma of left lung, stage 1 ([place]); Dysarthria as late effect of stroke; Bilateral carotid artery disease ([place]); Other and unspecified hyperlipidemia; Lack of coordination; Muscle weakness (generalized); Hyperlipidemia; Bronchitis; Diastolic CHF, acute ([place]); Dyspnea; Acute on chronic renal failure ([place]); Cough; Anemia; Mental status change; Hypotension; Acute encephalopathy; Encephalopathy acute; Altered mental status; Arterial hypotension; Back pain; UTI (urinary tract infection); History of CVA (cerebrovascular accident); Low back pain; and Cellulitis on her problem list.     has No Known Allergies.  Ms. Stephanie [name] had no medications administered during this visit.  Past Surgical History  Procedure Laterality Date  . No past surgeries    . Lung biopsy  10/13/11    left  . Abdominal hysterectomy  1975  . Varicose vein surgery  68yr ago  . Video assisted thoracoscopy  11/18/2011    Procedure: VIDEO ASSISTED THORACOSCOPY;  Surgeon: SMelrose Nakayama MD;  [place] [facility]  Service: Thoracic;  Laterality: Left;  . Lobectomy  11/18/2011    Procedure: LOBECTOMY;  Surgeon: SMelrose Nakayama MD;  [place] [facility]  Service: Thoracic;  Laterality: Left;  LEFT UPPER LOBECTOMY  . Breast biopsy  early 2000    Denies any headaches, dizziness, double vision, fevers, chills, night sweats, nausea, vomiting, diarrhea, constipation, chest pain, heart palpitations, shortness of breath, blood in stool, black tarry stool, urinary pain, urinary burning, urinary frequency, hematuria.   PHYSICAL EXAMINATION  ECOG PERFORMANCE STATUS: 1 - Symptomatic but completely ambulatory  Filed Vitals:   08/20/15 1505  BP: 125/49  Pulse: 59  Temp: 97.5 F (36.4 C)  Resp: 18    GENERAL:alert, no distress, well nourished, well developed, comfortable, cooperative, smiling and accompanied by her daughter and son. SKIN: skin color, texture, turgor are normal, no rashes or significant  lesions HEAD: Normocephalic, No masses, lesions, tenderness or abnormalities EYES: normal, PERRLA, EOMI, Conjunctiva are pink and non-injected EARS: External ears normal OROPHARYNX:lips, buccal mucosa, and tongue normal and mucous membranes are moist  NECK: supple, no adenopathy, thyroid normal size, non-tender, without nodularity, no stridor, non-tender, trachea midline LYMPH:  no palpable lymphadenopathy BREAST:not examined LUNGS: clear to auscultation and percussion HEART: regular rate & rhythm, no murmurs, no gallops, S1 normal and S2 normal ABDOMEN:abdomen soft, non-tender and normal bowel sounds BACK: Back symmetric, no curvature. EXTREMITIES:less then 2 second capillary refill, no joint deformities, effusion.  Right dorsal aspect of hand with erythema that is improving. NEURO: alert & oriented x 3 with fluent speech, no focal motor/sensory deficits, in a wheelchair    LABORATORY DATA: CBC    Component Value Date/Time   WBC 6.4 08/18/2015 1227   RBC 4.14 08/18/2015 1227   HGB 13.7 08/18/2015 1227   HCT 40.5 08/18/2015 1227   PLT 192 08/18/2015 1227   MCV 97.8 08/18/2015 1227   MCH 33.1 08/18/2015 1227   MCHC 33.8 08/18/2015 1227   RDW 12.4 08/18/2015 1227   LYMPHSABS 1.2 08/18/2015 1227   MONOABS 0.6 08/18/2015 1227   EOSABS 0.3 08/18/2015 1227   BASOSABS 0.0 08/18/2015 1227      Chemistry      Component Value Date/Time   NA 139 08/18/2015 1227  K 4.1 08/18/2015 1227   CL 106 08/18/2015 1227   CO2 28 08/18/2015 1227   BUN 20 08/18/2015 1227   CREATININE 1.40* 08/18/2015 1227      Component Value Date/Time   CALCIUM 9.1 08/18/2015 1227   ALKPHOS 78 08/18/2015 1227   AST 26 08/18/2015 1227   ALT 16 08/18/2015 1227   BILITOT 0.6 08/18/2015 1227        ASSESSMENT AND PLAN:  Adenocarcinoma of left lung, stage 1 Questionable progressive adenocarcinoma of the lung, not biopsy proven. Initial surgical resection 11/18/2011 T2a, pN0 Adenocarcinoma LUL, EGFR  mutated (deletion in exon 19).  She is tolerating Iressa well without any known side effects or toxicities.    She has not been seen in the clinic since June 2016 as a result of "no-show."  She is overdue for restaging CT scans.  I will get her set-up for CT of chest in near future to evaluate response to therapy.  Recent change in medication from Celexa to Cymbalta.  Return in 2-3 weeks for follow-up after CT imaging to review results and review future treatment options.  THERAPY PLAN:  Continue Iressa daily as prescribed (250 mg).  We will monitor for toxicities.  All questions were answered. The patient knows to call the clinic with any problems, questions or concerns. We can certainly see the patient much sooner if necessary.  Patient and plan discussed with Dr. Ancil Linsey and she is in agreement with the aforementioned.   This note is electronically signed by: Robynn Pane 08/20/2015 4:46 PM

## 2015-08-20 ENCOUNTER — Encounter (HOSPITAL_BASED_OUTPATIENT_CLINIC_OR_DEPARTMENT_OTHER): Payer: Medicare Other | Admitting: Oncology

## 2015-08-20 ENCOUNTER — Encounter (HOSPITAL_COMMUNITY): Payer: Self-pay | Admitting: Oncology

## 2015-08-20 VITALS — BP 125/49 | HR 59 | Temp 97.5°F | Resp 18

## 2015-08-20 DIAGNOSIS — C3492 Malignant neoplasm of unspecified part of left bronchus or lung: Secondary | ICD-10-CM | POA: Diagnosis not present

## 2015-08-20 NOTE — Patient Instructions (Signed)
..  [facility] at [facility] Discharge Instructions  RECOMMENDATIONS MADE BY THE CONSULTANT AND ANY TEST RESULTS WILL BE SENT TO YOUR REFERRING PHYSICIAN.  Labs were stable CT scan needs to be rescheduled in 1-3 weeks Return visit following CT scan   Thank you for choosing [facility] to provide your oncology and hematology care.  To afford each patient quality time with our provider, please arrive at least 15 minutes before your scheduled appointment time.    You need to re-schedule your appointment should you arrive 10 or more minutes late.  We strive to give you quality time with our providers, and arriving late affects you and other patients whose appointments are after yours.  Also, if you no show three or more times for appointments you may be dismissed from the clinic at the providers discretion.     Again, thank you for choosing [facility].  Our hope is that these requests will decrease the amount of time that you wait before being seen by our physicians.       _____________________________________________________________  Should you have questions after your visit to [facility], please contact our office at [phone] between the hours of 8:30 a.m. and 4:30 p.m.  Voicemails left after 4:30 p.m. will not be returned until the following business day.  For prescription refill requests, have your pharmacy contact our office.

## 2015-08-20 NOTE — Progress Notes (Signed)
LABS DRAWN

## 2015-09-02 ENCOUNTER — Ambulatory Visit (HOSPITAL_COMMUNITY)
Admit: 2015-09-02 | Discharge: 2015-09-02 | Disposition: A | Discharge status: Home / Self Care | Payer: Medicare Other | Source: Ambulatory Visit | Attending: Oncology | Admitting: Oncology

## 2015-09-02 ENCOUNTER — Ambulatory Visit (HOSPITAL_COMMUNITY): Payer: Medicare Other

## 2015-09-02 DIAGNOSIS — C3492 Malignant neoplasm of unspecified part of left bronchus or lung: Secondary | ICD-10-CM | POA: Diagnosis present

## 2015-09-03 ENCOUNTER — Other Ambulatory Visit (HOSPITAL_COMMUNITY): Payer: Self-pay | Admitting: Oncology

## 2015-09-03 DIAGNOSIS — C3492 Malignant neoplasm of unspecified part of left bronchus or lung: Secondary | ICD-10-CM

## 2015-09-04 ENCOUNTER — Ambulatory Visit (HOSPITAL_COMMUNITY): Payer: Medicare Other | Admitting: Oncology

## 2015-09-15 ENCOUNTER — Encounter: Payer: Self-pay | Admitting: Internal Medicine

## 2015-09-15 ENCOUNTER — Non-Acute Institutional Stay (SKILLED_NURSING_FACILITY): Payer: Medicare Other | Admitting: Internal Medicine

## 2015-09-15 DIAGNOSIS — I10 Essential (primary) hypertension: Secondary | ICD-10-CM

## 2015-09-15 DIAGNOSIS — F329 Major depressive disorder, single episode, unspecified: Secondary | ICD-10-CM

## 2015-09-15 DIAGNOSIS — L0390 Cellulitis, unspecified: Secondary | ICD-10-CM | POA: Diagnosis not present

## 2015-09-15 DIAGNOSIS — M545 Low back pain: Secondary | ICD-10-CM | POA: Diagnosis not present

## 2015-09-15 DIAGNOSIS — R634 Abnormal weight loss: Secondary | ICD-10-CM | POA: Diagnosis not present

## 2015-09-15 DIAGNOSIS — F32A Depression, unspecified: Secondary | ICD-10-CM

## 2015-09-15 DIAGNOSIS — I5031 Acute diastolic (congestive) heart failure: Secondary | ICD-10-CM | POA: Diagnosis not present

## 2015-09-15 NOTE — Progress Notes (Signed)
Patient ID: Stephanie [name], female   DOB: 1941/07/25, 74 y.o.   MRN: [id number]          this is a routine-  visit.  Level care skilled.  Facility [facility].   CHIEF COMPLAINT: Medical management of CVA late effect-hypertension-depression-CHF-acute visit secondary to suspected cellulitis right middle finger   HISTORY OF PRESENT ILLNESS:  This is a patient who came to [place] on 10/19/2014.  She had been on [facility] with shortness of breath.    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    She also has diastolic heart failure.    I in this regards She appears to be  stable has gained strength during her stay here.  She did have an ER visit earlier this year for altered mental status although no acute etiology was really found she was thought to be mildly dehydrated.  She also went to the ER  a while later-apparently she was brought by family member after complaining of severe back pain-it appears they did a fairly extensive workup in the ER labs were not really concerning a CT scan of the abdomen apparently showed gallstones but it was thought this was not causing her symptoms. She recently complained again of low back pain -- studies were negative for acute changes-tramadol at this point appears to be giving relief     She does have a history of CVA with some right-sided weakness and dysarthria-she is on aspirin for anticoagulation-she does ambulate about facility in the wheelchair.  She recently developed some erythema of her right hand this thought to be cellulitis and responded to a course of doxycycline.   several days ago was noted she has some increased erythema of her right middle finger she again has been started on doxycycline she is on day 4 of a seven-day course.  It also appears that she's lost some weight --about 10 pounds over the past month or so-it appears her appetite possibly has gone down a bit although this is fully difficult to tell appears to have a variable appetite-she is also crying more per nursing staff and her family-she is seen by psychiatric nurse practitioner--she is on Cymbalta as well as  Silenor--for insomnia-it appears she was last seen on October 27-           Family medical social history reviewed  per admission note 10/19/2014. and progress note on 04/14/2015  Medications have been reviewed per [facility] Atenolol 25 mg daily hold for pulse less than 60.  Aspirin 81 mg daily.  Celexa 40 mg daily.  Irresa 250 mg every morning    [name] when necessary.  Robitussin 5 mL every 4 hours when necessary cough.  Senokot when necessary.  Doxepin 3 mg daily at bedtime.  Tylenol 650 mg every 4 hours when necessary.  Zocor 20 mg daily at bedtime    Review of systems-somewhat limited secondary dysarthria obtained  from patient and nursing  In general no complaints of fever or chills--appears she may have lost some weight  Skin no recent rashes have been noted--appears to have cellulitis again this time in her right  middle finger-this does not really appear to be painful however.  Respiratory does not complain of cough or shortness of breath.  Head ears eyes nose mouth and throat does not complaining of sore throat or visual changes  Respiratory no complaints of cough or shortness of breath.  Cardiac no chest pain minimal lower extremity edema.  GI does not complain of any nausea vomiting diarrhea constipation or abdominal discomfort.  GU-currently no complaints of dysuria nursing staff does not report issues  Muscle skeletal does not complain of  joint pain or back pain this afternoon.  Neurologic is not complaining of dizziness or headache does have right-sided weakness with history of CVA   PHYSICAL EXAMINATION     Temperature 98.2 pulse 80 respirations 18 blood pressure 109/77--weight is 141.3  In general this is a pleasant elderly female sitting comfortably in her wheelchair  Her skin is warm and dry.--Right middle finger proximal aspect there is approximately an inch long area of erythema on the dorsal surface-there is some scaling here-this does not really go around the entire finger -does not appear to be acutely warm or tender at this point   Eyes sclera and conjunctiva are clear visual acuity appears grossly intact.  Nose  did not appreciate any active drainage.  Oropharynx is clear mucous membranes moist.  :   CHEST/RESPIRATORY:  Shallow, but otherwise clear air entry bilaterally.   CARDIOVASCULAR:  CARDIAC:  Regular  Rate and rhythm  She appears to be euvolemic-- there is minimal lower extremity edema.--She has baseline varicosities   Musculoskeletal-again right-sided weakness which is chronic status post CVA-  Abdomen is soft nontender with positive bowel sounds   GU cannot really appreciate overt suprapubic tenderness but patient does have dysarthric speech and is somewhat a poor historian NEUROLOGICAL:    CRANIAL NERVES:  She has a cortically-based language problem, but can converse.   SENSATION/STRENGTH:  She has right upper extremity greater than right lower extremity weakness-this is baseline.  Musculoskeletal-as noted above does have baseline neurologic deficits-does not appear to have acute back pain with tenderness to palpation but does appear to have some CV tenderness,   Labs  08/18/2015.  Sodium 139 potassium 4.1 BUN 20 creatinine 1.4.  Liver function tests within normal limits and note albumin of 3.5.  07/23/2015.  WBC 4.2 hemoglobin 12.6 platelets 154    06/10/2015.  WBC 4.4  hemoglobin 12.3 platelets 150.  Sodium 141 potassium 4.6 BUN 17 creatinine 1.48  04/15/2015.  Cholesterol 133 triglycerides 64 HDL 49 LDL 71.  04/11/2015.  Sodium 139 potassium 4 BUN 27 creatinine 1.5.  Liver function tests within normal limits except ALT of 11.  WBC 6.8 hemoglobin 12.9 platelets 188.    03/06/2015.  Sodium 139 potassium 4.6 BUN 22 creatinine 1.55.  Liver function tests within normal limits.  WBC 5.4 hemoglobin 12.5 platelets 150.  02/18/2015.  Sodium 138 potassium 4.2 BUN 32 creatinine 1.78  12/03/2014.  Sodium 138 potassium 4.4 BUN 19 creatinine 1.39.  11/19/2014.  WBC 6.3 hemoglobin 13.9 platelets 216.      ASSESSMENT/PLAN:                              Late-effect dominant hemisphere CVA.  Probably this is fairly distant she appears to have gained some strength she is on aspirin for anticoagulation  --she is also on a statin-recent lipid panel appears to be unremarkable LDL 71  Diastolic heart failure.  At this  point this appears stable--no longer onLasix  --clinically appears to be stable--appears to have some element of renal insufficiency with baseline creatinine appearing to run in the mid 1's area-- we will update this.    History of adeno-CA of the lung. She is followed by oncology per consultation with family  discontinued the Tarceva. She is now on Iressa and appears to be tolerating this well-staging CT scan to check on progression is pending  .  Anemia.  She has  Had  an OB-positive stool, but hemoglobin shows stability we will update this  History of renal insufficiency this appears to be  Fairly  stable  --will update this    History of hypertension this appears stable she is on atenolol there are orders to hold this for pulse less than 60.--Recent blood pressures 109/77-124/62                                                                                                      Depression--per family--staff she is crying  more-she has been seen by psychiatric services previously and appears her Cymbalta was increased to 60 mg a day-will have psychiatric nurse practitioner reevaluate her.    I do note she appears have lost some weight it is possible he may be depression related as well would like psychiatric nurse practitioner input on this-also will have dietary to evaluate --consider an appetite stimulate at some point but would like you psychiatric input on this first---  Will update an albumin level as well as TSH                         Intermittent back pain --again x-ray studies did not show any acute changes tramadol appears to be helping here at this point continue to monitor  Right middle finger cellulitis-we will continue the doxycycline there is some scaling here which would indicate possibly this is resolving this will have to be monitored       [id number]--of note greater than 35 minutes spent assessing patient-discussing her status with nursing staff-reviewing her chart-reviewing previous notes including oncology note-and coordinating and formulating a plan of care for numerous diagnoses-of note greater than 50% of time spent coordinating plan of care

## 2015-09-16 ENCOUNTER — Encounter (HOSPITAL_COMMUNITY)
Admission: AD | Admit: 2015-09-16 | Discharge: 2015-09-16 | Disposition: A | Discharge status: Home / Self Care | Payer: Medicare Other | Source: Skilled Nursing Facility | Attending: Internal Medicine | Admitting: Internal Medicine

## 2015-09-16 DIAGNOSIS — R079 Chest pain, unspecified: Secondary | ICD-10-CM | POA: Diagnosis not present

## 2015-09-16 LAB — CBC WITH DIFFERENTIAL/PLATELET
BASOS PCT: 0 %
Basophils Absolute: 0 10*3/uL (ref 0.0–0.1)
EOS ABS: 0.5 10*3/uL (ref 0.0–0.7)
Eosinophils Relative: 8 %
HEMATOCRIT: 42.2 % (ref 36.0–46.0)
HEMOGLOBIN: 14.2 g/dL (ref 12.0–15.0)
LYMPHS ABS: 1.1 10*3/uL (ref 0.7–4.0)
Lymphocytes Relative: 19 %
MCH: 32.6 pg (ref 26.0–34.0)
MCHC: 33.6 g/dL (ref 30.0–36.0)
MCV: 97 fL (ref 78.0–100.0)
MONOS PCT: 9 %
Monocytes Absolute: 0.5 10*3/uL (ref 0.1–1.0)
NEUTROS ABS: 3.7 10*3/uL (ref 1.7–7.7)
NEUTROS PCT: 65 %
Platelets: 235 10*3/uL (ref 150–400)
RBC: 4.35 MIL/uL (ref 3.87–5.11)
RDW: 12.1 % (ref 11.5–15.5)
WBC: 5.8 10*3/uL (ref 4.0–10.5)

## 2015-09-16 LAB — COMPREHENSIVE METABOLIC PANEL
ALT: 17 U/L (ref 14–54)
ANION GAP: 7 (ref 5–15)
AST: 27 U/L (ref 15–41)
Albumin: 3.5 g/dL (ref 3.5–5.0)
Alkaline Phosphatase: 94 U/L (ref 38–126)
BUN: 21 mg/dL — ABNORMAL HIGH (ref 6–20)
CALCIUM: 9.3 mg/dL (ref 8.9–10.3)
CHLORIDE: 106 mmol/L (ref 101–111)
CO2: 26 mmol/L (ref 22–32)
Creatinine, Ser: 1.21 mg/dL — ABNORMAL HIGH (ref 0.44–1.00)
GFR, EST AFRICAN AMERICAN: 50 mL/min — AB (ref >60)
GFR, EST NON AFRICAN AMERICAN: 43 mL/min — AB (ref >60)
Glucose, Bld: 116 mg/dL — ABNORMAL HIGH (ref 65–99)
Potassium: 3.9 mmol/L (ref 3.5–5.1)
SODIUM: 139 mmol/L (ref 135–145)
Total Bilirubin: 0.7 mg/dL (ref 0.3–1.2)
Total Protein: 7.5 g/dL (ref 6.5–8.1)

## 2015-09-16 LAB — TSH: TSH: 1.465 u[IU]/mL (ref 0.350–4.500)

## 2015-09-22 ENCOUNTER — Non-Acute Institutional Stay (SKILLED_NURSING_FACILITY): Payer: Medicare Other | Admitting: Internal Medicine

## 2015-09-22 DIAGNOSIS — L0390 Cellulitis, unspecified: Secondary | ICD-10-CM

## 2015-09-22 DIAGNOSIS — N179 Acute kidney failure, unspecified: Secondary | ICD-10-CM

## 2015-09-22 DIAGNOSIS — I10 Essential (primary) hypertension: Secondary | ICD-10-CM | POA: Diagnosis not present

## 2015-09-22 DIAGNOSIS — N189 Chronic kidney disease, unspecified: Secondary | ICD-10-CM | POA: Diagnosis not present

## 2015-09-22 DIAGNOSIS — R21 Rash and other nonspecific skin eruption: Secondary | ICD-10-CM | POA: Diagnosis not present

## 2015-09-22 NOTE — Progress Notes (Signed)
Patient ID: Stephanie [name], female   DOB: 1941-01-18, 74 y.o.   MRN: [id number]           this is an acute -  visit.  Level care skilled.  Facility [facility].   CHIEF COMPLAINT:  Acute visit secondary to right cheek rash-follow-up right middle finger cellulitis--  HISTORY OF PRESENT ILLNESS:  This is a patient who came to [place] on 10/19/2014.  She had been on [facility] with shortness of breath.    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    She also has diastolic heart failure.      several days ago was noted she has some increased erythema of her right middle finger--she was started on doxycycline and per review today this appears to be essentially resolved there is some scaling and crusting at the area of previous erythema -- the erythema that remains is cool to touch nontender--appears somewhat chronic.  She has developed a erythematous rash on her right cheek extending somewhat to her nose-according to nursing staff she as had some erythema here before but appears to be heightened today  This does not appear to be warm or tender.  .   Family medical social history reviewed  per admission note 10/19/2014. and progress note on 04/14/2015  Medications have been reviewed per [facility] Atenolol 25 mg daily hold for pulse less than 60.  Aspirin 81 mg daily.  Celexa 40 mg daily.  Irresa 250 mg every morning    [name] when necessary.  Robitussin 5 mL every 4 hours when necessary cough.  Senokot when necessary.  Doxepin 3 mg daily at bedtime.  Tylenol 650 mg every 4 hours when necessary.  Zocor 20 mg  daily at bedtime    Review of systems-somewhat limited secondary dysarthria obtained  from patient and nursing  In general no complaints of fever or chills-  Skin  As noted above has developed erythema of her right cheek area-erythema right middle finger appears to be resolving.  Respiratory does not complain of cough or shortness of breath.  Head ears eyes nose mouth and throat does not complaining of sore throat or visual changes  Respiratory no complaints of cough or shortness of breath.  Cardiac no chest pain minimal lower extremity edema.  GI does not complain of any nausea vomiting diarrhea constipation or abdominal discomfort.  GU-currently no complaints of dysuria nursing staff does not report issues  Muscle skeletal does not complain of joint pain or back pain this afternoon.  Neurologic is not complaining of dizziness or headache does have right-sided weakness with history of CVA   PHYSICAL EXAMINATION  She is afebrile pulse 88 respirations 18 blood pressure 114/76  In general this is a pleasant elderly female sitting comfortably in her wheelchair  Her skin is warm and dry.--Right middle finger proximal aspect--erythema appears to be resolving there is some residual scaling this does not extend around the finger is cool to touch and nontender nonedematous there is no drainage  On her right cheek there is an erythematous rash type area this appears to be rosacea like there is some extension  into the nose area-it is not tender or warm to touch--has appearence somewhat of dilated blood vessels     Eyes sclera and conjunctiva are clear visual acuity appears grossly intact.  Nose  did not appreciate any active drainage.  Oropharynx is clear mucous membranes moist.  :   CHEST/RESPIRATORY:  Shallow, but otherwise clear air entry bilaterally.   CARDIOVASCULAR:  CARDIAC:  Regular  Rate and rhythm  She appears to be euvolemic-- there is minimal lower extremity  edema.--She has baseline varicosities   Musculoskeletal-again right-sided weakness which is chronic status post CVA-  Abdomen is soft nontender with positive bowel sounds     NEUROLOGICAL:    CRANIAL NERVES:  She has a cortically-based language problem, but can converse.   SENSATION/STRENGTH:  She has right upper extremity greater than right lower extremity weakness-this is baseline.  Musculoskeletal-as noted above does have baseline neurologic deficits-d With right-sided weakness she does ambulate in wheelchair   Labs  09/16/2015.  Sodium 139 potassium 3.9 BUN 21 creatinine 1.21.  WBC 5.1 hemoglobin 14.2 platelets 235.  TSH-1.465  08/18/2015.  Sodium 139 potassium 4.1 BUN 20 creatinine 1.4.  Liver function tests within normal limits and note albumin of 3.5.  07/23/2015.  WBC 4.2 hemoglobin 12.6 platelets 154    06/10/2015.  WBC 4.4 hemoglobin 12.3 platelets 150.  Sodium 141 potassium 4.6 BUN 17 creatinine 1.48  04/15/2015.  Cholesterol 133 triglycerides 64 HDL 49 LDL 71.  04/11/2015.  Sodium 139 potassium 4 BUN 27 creatinine 1.5.  Liver function tests within normal limits except ALT of 11.  WBC 6.8 hemoglobin 12.9 platelets 188.    03/06/2015.  Sodium 139 potassium 4.6 BUN 22 creatinine 1.55.  Liver function tests within normal limits.  WBC 5.4 hemoglobin 12.5 platelets 150.  02/18/2015.  Sodium 138 potassium 4.2 BUN 32 creatinine 1.78  12/03/2014.  Sodium 138 potassium 4.4 BUN 19 creatinine 1.39.  11/19/2014.  WBC 6.3 hemoglobin 13.9 platelets 216.      ASSESSMENT/PLAN:                              History of erythematous rash mostly right cheek-this appears to have a rosacea type presentation Will treat with MetroGel cream daily until resolved if no resolution notify provider this does not appear to be cellulitic.  History of right middle finger cellulitis this appears essentially resolved as noted above at this point monitor--she  has completed a course of doxycycline  History of renal insufficiency-she is no longer on Lasix she does have a history of diastolic CHF which appears to be stable-creatinine of 1.21 BUN of 21 on 09/16/2015 actually appears to be somewhat of an improvement from her baseline which is more in the mid 1 area at this point monitor this appears to be stable  .    History of adeno-CA of the lung. She is followed by oncology per consultation with family  discontinued the Tarceva. She is now on Iressa and appears to be tolerating this well-staging CT scan to check on progression is pending  .  Anemia.  She has  Had  an OB-positive stool, but hemoglobin shows stability at 14.2 on lab done November 16-2016  [id number] note greater than 25 minutes spent assessing patient reviewing her labs reviewing her chart discussing her status with nursing staff in regards to history of this cheek rash-and coordinating and formulating plan of care-of note greater than 50% of time spent coordinating plan  of care with nursing input      History of hypertension this appears stable she is on atenolol there are orders to hold this for pulse less than 60.--Recent blood pressures 114/76     .          [id number]--of note greater than 35 minutes spent assessing patient-discussing her status with nursing staff-reviewing her chart-reviewing previous notes including oncology note-and coordinating and formulating a plan of care for numerous diagnoses-of note greater than 50% of time spent coordinating plan of care

## 2015-10-01 ENCOUNTER — Other Ambulatory Visit (HOSPITAL_COMMUNITY): Payer: Self-pay | Admitting: Oncology

## 2015-10-01 DIAGNOSIS — C3492 Malignant neoplasm of unspecified part of left bronchus or lung: Secondary | ICD-10-CM

## 2015-10-01 MED ORDER — GEFITINIB 250 MG PO TABS: 250.0000 mg | ORAL_TABLET | Freq: Every day | ORAL | Status: DC

## 2015-10-19 ENCOUNTER — Other Ambulatory Visit: Payer: Self-pay | Admitting: *Deleted

## 2015-10-19 MED ORDER — ALPRAZOLAM 0.25 MG PO TABS: ORAL_TABLET | ORAL | Status: DC

## 2015-10-19 NOTE — Telephone Encounter (Signed)
[facility]  

## 2015-10-22 ENCOUNTER — Inpatient Hospital Stay (HOSPITAL_COMMUNITY): Payer: Medicare Other

## 2015-10-22 ENCOUNTER — Encounter (HOSPITAL_COMMUNITY): Payer: Self-pay

## 2015-10-22 ENCOUNTER — Observation Stay (HOSPITAL_COMMUNITY)
Admission: EM | Admit: 2015-10-22 | Discharge: 2015-10-23 | Disposition: A | Discharge status: Skilled Nursing Facility | Payer: Medicare Other | Attending: Internal Medicine | Admitting: Internal Medicine

## 2015-10-22 ENCOUNTER — Emergency Department (HOSPITAL_COMMUNITY): Payer: Medicare Other

## 2015-10-22 DIAGNOSIS — I517 Cardiomegaly: Secondary | ICD-10-CM | POA: Diagnosis not present

## 2015-10-22 DIAGNOSIS — G8194 Hemiplegia, unspecified affecting left nondominant side: Secondary | ICD-10-CM

## 2015-10-22 DIAGNOSIS — C3492 Malignant neoplasm of unspecified part of left bronchus or lung: Secondary | ICD-10-CM | POA: Diagnosis present

## 2015-10-22 DIAGNOSIS — R1310 Dysphagia, unspecified: Secondary | ICD-10-CM | POA: Diagnosis not present

## 2015-10-22 DIAGNOSIS — E785 Hyperlipidemia, unspecified: Secondary | ICD-10-CM | POA: Insufficient documentation

## 2015-10-22 DIAGNOSIS — G459 Transient cerebral ischemic attack, unspecified: Principal | ICD-10-CM | POA: Diagnosis present

## 2015-10-22 DIAGNOSIS — E7800 Pure hypercholesterolemia, unspecified: Secondary | ICD-10-CM | POA: Diagnosis not present

## 2015-10-22 DIAGNOSIS — Z833 Family history of diabetes mellitus: Secondary | ICD-10-CM | POA: Insufficient documentation

## 2015-10-22 DIAGNOSIS — Z8489 Family history of other specified conditions: Secondary | ICD-10-CM | POA: Insufficient documentation

## 2015-10-22 DIAGNOSIS — I1310 Hypertensive heart and chronic kidney disease without heart failure, with stage 1 through stage 4 chronic kidney disease, or unspecified chronic kidney disease: Secondary | ICD-10-CM | POA: Insufficient documentation

## 2015-10-22 DIAGNOSIS — Z7982 Long term (current) use of aspirin: Secondary | ICD-10-CM | POA: Insufficient documentation

## 2015-10-22 DIAGNOSIS — M19041 Primary osteoarthritis, right hand: Secondary | ICD-10-CM | POA: Insufficient documentation

## 2015-10-22 DIAGNOSIS — M19042 Primary osteoarthritis, left hand: Secondary | ICD-10-CM | POA: Insufficient documentation

## 2015-10-22 DIAGNOSIS — G9340 Encephalopathy, unspecified: Secondary | ICD-10-CM | POA: Diagnosis present

## 2015-10-22 DIAGNOSIS — Z809 Family history of malignant neoplasm, unspecified: Secondary | ICD-10-CM | POA: Insufficient documentation

## 2015-10-22 DIAGNOSIS — I6523 Occlusion and stenosis of bilateral carotid arteries: Secondary | ICD-10-CM | POA: Insufficient documentation

## 2015-10-22 DIAGNOSIS — I63039 Cerebral infarction due to thrombosis of unspecified carotid artery: Secondary | ICD-10-CM

## 2015-10-22 DIAGNOSIS — Z823 Family history of stroke: Secondary | ICD-10-CM | POA: Diagnosis not present

## 2015-10-22 DIAGNOSIS — R29898 Other symptoms and signs involving the musculoskeletal system: Secondary | ICD-10-CM

## 2015-10-22 DIAGNOSIS — G319 Degenerative disease of nervous system, unspecified: Secondary | ICD-10-CM | POA: Insufficient documentation

## 2015-10-22 DIAGNOSIS — N183 Chronic kidney disease, stage 3 (moderate): Secondary | ICD-10-CM | POA: Insufficient documentation

## 2015-10-22 DIAGNOSIS — Z90710 Acquired absence of both cervix and uterus: Secondary | ICD-10-CM | POA: Insufficient documentation

## 2015-10-22 DIAGNOSIS — Z79899 Other long term (current) drug therapy: Secondary | ICD-10-CM | POA: Diagnosis not present

## 2015-10-22 DIAGNOSIS — I739 Peripheral vascular disease, unspecified: Secondary | ICD-10-CM | POA: Diagnosis not present

## 2015-10-22 DIAGNOSIS — G4700 Insomnia, unspecified: Secondary | ICD-10-CM | POA: Insufficient documentation

## 2015-10-22 DIAGNOSIS — R531 Weakness: Secondary | ICD-10-CM | POA: Diagnosis not present

## 2015-10-22 DIAGNOSIS — I639 Cerebral infarction, unspecified: Secondary | ICD-10-CM | POA: Diagnosis present

## 2015-10-22 DIAGNOSIS — I69351 Hemiplegia and hemiparesis following cerebral infarction affecting right dominant side: Secondary | ICD-10-CM | POA: Diagnosis not present

## 2015-10-22 LAB — URINE MICROSCOPIC-ADD ON
Bacteria, UA: NONE SEEN
WBC, UA: NONE SEEN WBC/hpf (ref 0–5)

## 2015-10-22 LAB — URINALYSIS, ROUTINE W REFLEX MICROSCOPIC
BILIRUBIN URINE: NEGATIVE
Glucose, UA: NEGATIVE mg/dL
KETONES UR: NEGATIVE mg/dL
Leukocytes, UA: NEGATIVE
Nitrite: NEGATIVE
SPECIFIC GRAVITY, URINE: 1.02 (ref 1.005–1.030)
pH: 6 (ref 5.0–8.0)

## 2015-10-22 LAB — I-STAT CHEM 8, ED
BUN: 16 mg/dL (ref 6–20)
CHLORIDE: 104 mmol/L (ref 101–111)
CREATININE: 1.2 mg/dL — AB (ref 0.44–1.00)
Calcium, Ion: 1.16 mmol/L (ref 1.13–1.30)
GLUCOSE: 122 mg/dL — AB (ref 65–99)
HEMATOCRIT: 37 % (ref 36.0–46.0)
HEMOGLOBIN: 12.6 g/dL (ref 12.0–15.0)
POTASSIUM: 4.1 mmol/L (ref 3.5–5.1)
Sodium: 140 mmol/L (ref 135–145)
TCO2: 26 mmol/L (ref 0–100)

## 2015-10-22 LAB — CBC
HEMATOCRIT: 36.9 % (ref 36.0–46.0)
Hemoglobin: 12.5 g/dL (ref 12.0–15.0)
MCH: 33.2 pg (ref 26.0–34.0)
MCHC: 33.9 g/dL (ref 30.0–36.0)
MCV: 97.9 fL (ref 78.0–100.0)
PLATELETS: 159 10*3/uL (ref 150–400)
RBC: 3.77 MIL/uL — AB (ref 3.87–5.11)
RDW: 12.8 % (ref 11.5–15.5)
WBC: 4.7 10*3/uL (ref 4.0–10.5)

## 2015-10-22 LAB — COMPREHENSIVE METABOLIC PANEL
ALK PHOS: 85 U/L (ref 38–126)
ALT: 16 U/L (ref 14–54)
AST: 27 U/L (ref 15–41)
Albumin: 3.2 g/dL — ABNORMAL LOW (ref 3.5–5.0)
Anion gap: 5 (ref 5–15)
BUN: 17 mg/dL (ref 6–20)
CHLORIDE: 105 mmol/L (ref 101–111)
CO2: 27 mmol/L (ref 22–32)
CREATININE: 1.23 mg/dL — AB (ref 0.44–1.00)
Calcium: 8.8 mg/dL — ABNORMAL LOW (ref 8.9–10.3)
GFR calc Af Amer: 49 mL/min — ABNORMAL LOW (ref >60)
GFR calc non Af Amer: 42 mL/min — ABNORMAL LOW (ref >60)
GLUCOSE: 127 mg/dL — AB (ref 65–99)
Potassium: 4.2 mmol/L (ref 3.5–5.1)
SODIUM: 137 mmol/L (ref 135–145)
Total Bilirubin: 0.7 mg/dL (ref 0.3–1.2)
Total Protein: 6.9 g/dL (ref 6.5–8.1)

## 2015-10-22 LAB — PROTIME-INR
INR: 1.16 (ref 0.00–1.49)
PROTHROMBIN TIME: 15 s (ref 11.6–15.2)

## 2015-10-22 LAB — RAPID URINE DRUG SCREEN, HOSP PERFORMED
AMPHETAMINES: NOT DETECTED
BARBITURATES: NOT DETECTED
BENZODIAZEPINES: POSITIVE — AB
Cocaine: NOT DETECTED
Opiates: NOT DETECTED
TETRAHYDROCANNABINOL: NOT DETECTED

## 2015-10-22 LAB — DIFFERENTIAL
BASOS ABS: 0 10*3/uL (ref 0.0–0.1)
BASOS PCT: 0 %
Eosinophils Absolute: 0.3 10*3/uL (ref 0.0–0.7)
Eosinophils Relative: 6 %
LYMPHS PCT: 25 %
Lymphs Abs: 1.2 10*3/uL (ref 0.7–4.0)
Monocytes Absolute: 0.5 10*3/uL (ref 0.1–1.0)
Monocytes Relative: 10 %
NEUTROS ABS: 2.7 10*3/uL (ref 1.7–7.7)
Neutrophils Relative %: 59 %

## 2015-10-22 LAB — I-STAT TROPONIN, ED: Troponin i, poc: 0 ng/mL (ref 0.00–0.08)

## 2015-10-22 LAB — APTT: APTT: 36 s (ref 24–37)

## 2015-10-22 MED ORDER — ENOXAPARIN SODIUM 40 MG/0.4ML ~~LOC~~ SOLN
40.0000 mg | SUBCUTANEOUS | Status: DC
  Administered 2015-10-22: 40 mg via SUBCUTANEOUS
  Filled 2015-10-22: qty 0.4

## 2015-10-22 MED ORDER — ASPIRIN 300 MG RE SUPP
300.0000 mg | Freq: Every day | RECTAL | Status: DC
  Administered 2015-10-22 – 2015-10-23 (×2): 300 mg via RECTAL
  Filled 2015-10-22 (×2): qty 1

## 2015-10-22 MED ORDER — STROKE: EARLY STAGES OF RECOVERY BOOK
Freq: Once | Status: DC
  Filled 2015-10-22: qty 1

## 2015-10-22 MED ORDER — SODIUM CHLORIDE 0.9 % IV SOLN
INTRAVENOUS | Status: DC
  Administered 2015-10-22: 19:00:00 via INTRAVENOUS

## 2015-10-22 NOTE — Progress Notes (Signed)
[id number] beeper 0912 in room, lab drawing blood 0918 call to [place]

## 2015-10-22 NOTE — ED Notes (Signed)
MD Marin Comment at bedside updating patient and family.

## 2015-10-22 NOTE — ED Notes (Signed)
Family at bedside, pt more alert than on arrival, responding to questions appropriately and quicker.

## 2015-10-22 NOTE — ED Notes (Signed)
MD aware pt failed stroke swallow screen.

## 2015-10-22 NOTE — ED Notes (Signed)
Dr. yelverton at bedside. 

## 2015-10-22 NOTE — Evaluation (Addendum)
Clinical/Bedside Swallow Evaluation Patient Details  Name: Stephanie [name] MRN: [id number] Date of Birth: 09-23-1941  Today's Date: 10/22/2015 Time: SLP Start Time (ACUTE ONLY): 1815 SLP Stop Time (ACUTE ONLY): 1855 SLP Time Calculation (min) (ACUTE ONLY): 40 min  Past Medical History:  Past Medical History  Diagnosis Date  . CVA (cerebral infarction)     speech difficulities  . Stroke ([place])   . Hypercholesteremia     Takes Zocor daily  . Hypertension     takes Atenolol and Lisinopril daily  . Lung mass     left upper lobe  . Arthritis     hands  . TIA (transient ischemic attack)     Sat before thanksgiving;impaired speech  . Pneumothorax   . Lung cancer ([place])   . CKD (chronic kidney disease), stage III    Past Surgical History:  Past Surgical History  Procedure Laterality Date  . No past surgeries    . Lung biopsy  10/13/11    left  . Abdominal hysterectomy  1975  . Varicose vein surgery  52yr ago  . Video assisted thoracoscopy  11/18/2011    Procedure: VIDEO ASSISTED THORACOSCOPY;  Surgeon: SMelrose Nakayama MD;  [place] [facility]  Service: Thoracic;  Laterality: Left;  . Lobectomy  11/18/2011    Procedure: LOBECTOMY;  Surgeon: SMelrose Nakayama MD;  [place] [facility]  Service: Thoracic;  Laterality: Left;  LEFT UPPER LOBECTOMY  . Breast biopsy  early 2000   HPI:  FKAIMANA NEUZILis an 74y.o. 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 She was seen by Teleneurologist, and deemed not a candidate for her symptoms. She improved in the ER, and work up included a head CT showeing no acute process. Her CBG, renal Fx test, and Hb were unremarakble. Her EKG showed NSR with no acute ST T changes. She failed her swallow screen, and coughed with drinking thru a straw. Hospitalist was asked to admit her for TIA/CVA.  Clinical swallow evaluation ordered due to failed RN swallow screen. Pt is known to this SLP from previous outpatient therapy two years ago. MRI was negative for acute event. Daughter in room for swallow evaluation.    Assessment / Plan / Recommendation Clinical Impression  Stephanie [name] seen at bedside for clinical swallow evaluation secondary to failing RN swallow screen. Pt is known to this SLP from previous outpatient therapy 2.5 years ago. Her speech is significantly more impaired now and daughter reports that it progressively worsened over the past few years. [facility]. Pt unable to follow all commands during oral motor examination, but was able to imitate with cues. Pt assessed with ice chips, water via cup and straw sips, applesauce, and applesauce with graham crackers (mech soft). Pt edentulous, but daughter reports fairly good tolerance of regular textures. Pt demonstrated increased labial closure with straw sip, however did have one strong cough over 120 mL. Pt with mild/mod lingual residuals after mech soft trials which reduced to min with liquid wash and repeat swallow. Recommend D3/mech soft with thin liquids; ok for straw. Suspect that pt may cough occasionally, however she appears to protect airway appropriately. Cue pt for small sips and repeat swallows to ensure clear oral cavity. Pt may need assist with oral  care. Crush meds as able in puree as this is her baseline. No current chest x-ray. Pt with history of Adenocarcinoma of left lung.     Aspiration Risk  Mild aspiration risk    Diet Recommendation Dysphagia 3 (Mech soft);Thin liquid   Liquid Administration via: Straw Medication Administration: Crushed with puree Supervision: Full supervision/cueing for compensatory strategies Compensations: Slow rate;Small sips/bites;Multiple dry swallows after each bite/sip Postural Changes: Seated upright at 90 degrees;Remain  upright for at least 30 minutes after po intake    Other  Recommendations Oral Care Recommendations: Oral care BID;Staff/trained caregiver to provide oral care Other Recommendations: Clarify dietary restrictions   Follow up Recommendations  24 hour supervision/assistance    Frequency and Duration min 1 x/week  1 week       Prognosis Prognosis for Safe Diet Advancement: Stephanie [name] Date of Onset: 2015-11-09 HPI: Stephanie [name] is an 74 y.o. 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 She was seen by Teleneurologist, and deemed not a candidate for her symptoms. She improved in the ER, and work up included a head CT showeing no acute process. Her CBG, renal Fx test, and Hb were unremarakble. Her EKG showed NSR with no acute ST T changes. She failed her swallow screen, and coughed with drinking thru a straw. Hospitalist was asked to admit her for TIA/CVA. Clinical swallow evaluation ordered due to failed RN swallow screen. Pt is known to this SLP from previous outpatient therapy two years ago. MRI was negative for acute event. Daughter in room for swallow evaluation.  Type of Study: Bedside Swallow Evaluation Diet Prior to this Study: NPO Temperature Spikes Noted: No Respiratory Status: Room air History of Recent Intubation: No Behavior/Cognition: Alert;Cooperative;Pleasant mood Oral Cavity Assessment: Within Functional Limits Oral Care Completed by SLP: Recent completion by staff Oral Cavity - Dentition: Edentulous Vision: Functional for self-feeding Self-Feeding Abilities: Needs assist Patient Positioning: Upright in bed Baseline Vocal Quality: Normal Volitional Cough: Weak Volitional Swallow: Unable to elicit    Oral/Motor/Sensory Function Overall Oral Motor/Sensory Function: Mild impairment Facial  ROM: Reduced right;Reduced left Facial Strength: Reduced right;Reduced left Lingual ROM:  (unable to follow commands to lateralize) Lingual Symmetry: Within Functional Limits Lingual Strength: Reduced Velum:  (could not visualize) Mandible: Within Functional Limits   Ice Chips Ice chips: Within functional limits Presentation: Spoon   Thin Liquid Thin Liquid: Impaired Presentation: Cup;Straw Oral Phase Impairments: Reduced labial seal Pharyngeal  Phase Impairments: Cough - Immediate (x1)    Nectar Thick Nectar Thick Liquid: Not tested   Honey Thick Honey Thick Liquid: Not tested   Puree Puree: Within functional limits Presentation: Spoon   Solid Solid: Impaired Presentation: Spoon Oral Phase Impairments: Reduced lingual movement/coordination Oral Phase Functional Implications: Oral residue (lingual residue)      Thank you,  Genene Churn, [place]  PORTER,DABNEY 09-Nov-2015,7:29 PM  G-Codes Swallowing; clinical judgment Initial: CI Goal: CI Discharge: CI

## 2015-10-22 NOTE — H&P (Signed)
[facility] History and Physical  CHERESE LOZANO UEK:[id number] DOB: 13-Mar-1941    PCP:   [name], MD   Chief Complaint: TIA.   HPI: Stephanie [name] is an 74 y.o. 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  She was seen by Teleneurologist, and deemed not a candidate for her symptoms.  She improved in the ER, and work up included a head CT showeing no acute process.  Her CBG, renal Fx test, and Hb were unremarakble.  Her EKG showed NSR with no acute ST T changes.  She failed her swallow test, and coughed with drinking thru a straw.  Hospitalist was asked to admit her for TIA/CVA.   Rewiew of Systems: Unable.     Past Medical History  Diagnosis Date  . CVA (cerebral infarction)     speech difficulities  . Stroke ([place])   . Hypercholesteremia     Takes Zocor daily  . Hypertension     takes Atenolol and Lisinopril daily  . Lung mass     left upper lobe  . Arthritis     hands  . TIA (transient ischemic attack)     Sat before thanksgiving;impaired speech  . Pneumothorax   . Lung cancer ([place])   . CKD (chronic kidney disease), stage III     Past Surgical History  Procedure Laterality Date  . No past surgeries    . Lung biopsy  10/13/11    left  . Abdominal hysterectomy  1975  . Varicose vein surgery  60yr ago  . Video assisted thoracoscopy  11/18/2011    Procedure: VIDEO ASSISTED THORACOSCOPY;  Surgeon: SMelrose Nakayama MD;  [place] [facility]  Service: Thoracic;  Laterality: Left;  . Lobectomy  11/18/2011    Procedure: LOBECTOMY;  Surgeon: SMelrose Nakayama MD;  [place] [facility]  Service: Thoracic;  Laterality: Left;  LEFT UPPER LOBECTOMY  . Breast biopsy  early 2000    Medications:  HOME MEDS: Prior to Admission medications   Medication Sig Start Date End Date Taking? Authorizing Provider  ALPRAZolam  (Duanne Moron 0.25 MG tablet Take one tablet by mouth three times daily as needed for anxiety. Monitor for sedation 10/19/15  Yes Tiffany L Reed, DO  aspirin EC 81 MG tablet Take 81 mg by mouth every morning.   Yes Historical Provider, MD  atenolol (TENORMIN) 25 MG tablet Take 25 mg by mouth daily.   Yes Historical Provider, MD  Doxepin HCl 3 MG TABS Take 1 tablet (3 mg total) by mouth at bedtime. 02/18/15  Yes TBaird Cancer PA-C  DULoxetine (CYMBALTA) 60 MG capsule Take 60 mg by mouth daily.   Yes Historical Provider, MD  gefitinib (IRESSA) 250 MG tablet Take 1 tablet (250 mg total) by mouth daily. 10/01/15  Yes TManon HildingKefalas, PA-C  metroNIDAZOLE (METROGEL) 0.75 % gel Apply 1 application topically daily.   Yes Historical Provider, MD  simvastatin (ZOCOR) 20 MG tablet Take 1 tablet (20 mg total) by mouth daily. 08/27/14  Yes SKathyrn Drown MD  traMADol (ULTRAM) 50 MG tablet Take 1 tablet (50 mg total) by mouth every 6 (six) hours as needed. 04/11/15  Yes SFredia Sorrow MD  acetaminophen (TYLENOL) 325 MG tablet Take 650 mg by mouth every 6 (six) hours as needed.    Historical Provider, MD  diphenoxylate-atropine ([name]) 2.5-0.025 MG per tablet Take 1 tablet by mouth 4 (four) times daily as needed for diarrhea or loose stools. Patient not taking: Reported on 08/20/2015 06/25/14   Farrel Gobble, MD  guaiFENesin-dextromethorphan [facility] DM) 100-10 MG/5ML syrup Take 5 mLs by mouth every 4 (four) hours as needed for cough.    Historical Provider, MD  senna-docusate (SENOKOT-S) 8.6-50 MG per tablet Take 1 tablet by mouth at bedtime as needed for mild constipation. 03/17/15   Radene Gunning, NP     Allergies:  No Known Allergies  Social History:   reports that she has never smoked. She has never used smokeless tobacco. She reports that she does not drink alcohol or use illicit drugs.  Family History: Family History  Problem Relation Age of Onset  . Stroke Mother   . Stroke Sister   .  Anesthesia problems Neg Hx   . Hypotension Neg Hx   . Malignant hyperthermia Neg Hx   . Pseudochol deficiency Neg Hx   . Diabetes Brother   . Cancer Brother   . Stroke Brother      Physical Exam: Filed Vitals:   10/22/15 1330 10/22/15 1345 10/22/15 1400 10/22/15 1415  BP: 119/53 127/64 122/59 123/58  Pulse: 59 64 52 51  Temp:      TempSrc:      Resp: '16 18 17 16  '$ Weight:      SpO2: 99% 100% 100% 100%   Blood pressure 123/58, pulse 51, temperature 97.4 F (36.3 C), temperature source Oral, resp. rate 16, weight 68.04 kg (150 lb), SpO2 100 %.  GEN:  Pleasant  patient lying in the stretcher in no acute distress; cooperative with exam. PSYCH:  alert and oriented x4; does not appear anxious or depressed; affect is appropriate. HEENT: Mucous membranes pink and anicteric; PERRLA; EOM intact; no cervical lymphadenopathy nor thyromegaly or carotid bruit; no JVD; There were no stridor. Neck is very supple. Breasts:: Not examined CHEST WALL: No tenderness CHEST: Normal respiration, clear to auscultation bilaterally.  HEART: Regular rate and rhythm.  There are no murmur, rub, or gallops.   BACK: No kyphosis or scoliosis; no CVA tenderness ABDOMEN: soft and non-tender; no masses, no organomegaly, normal abdominal bowel sounds; no pannus; no intertriginous candida. There is no rebound and no distention. Rectal Exam: Not done EXTREMITIES: No bone or joint deformity; age-appropriate arthropathy of the hands and knees; no edema; no ulcerations.  There is no calf tenderness. Genitalia: not examined PULSES: 2+ and symmetric SKIN: Normal hydration no rash or ulceration CNS: Cranial nerves 2-12 grossly intact no focal lateralizing neurologic deficit.  Speech with disarthria, uvula elevated with phonation, facial symmetry and tongue midline. DTR are normal bilaterally, cerebella exam is intact, barbinski is negative and strengths are equaled bilaterally.  No sensory loss.   Labs on Admission:   Basic Metabolic Panel:  Recent Labs Lab 10/22/15 0909 10/22/15 0947  NA 137 140  K 4.2 4.1  CL 105 104  CO2 27  --   GLUCOSE 127* 122*  BUN 17 16  CREATININE 1.23* 1.20*  CALCIUM 8.8*  --    Liver Function Tests:  Recent Labs Lab 10/22/15 0909  AST 27  ALT 16  ALKPHOS 85  BILITOT 0.7  PROT 6.9  ALBUMIN 3.2*   CBC:  Recent Labs Lab 10/22/15 0909 10/22/15 0947  WBC 4.7  --   NEUTROABS 2.7  --   HGB 12.5 12.6  HCT 36.9 37.0  MCV 97.9  --  PLT 159  --    Radiological Exams on Admission: Ct Head Wo Contrast  10/22/2015  CLINICAL DATA:  Code stroke EXAM: CT HEAD WITHOUT CONTRAST TECHNIQUE: Contiguous axial images were obtained from the base of the skull through the vertex without intravenous contrast. COMPARISON:  03/16/2015 FINDINGS: There is prominence of the sulci and ventricles identified compatible with brain atrophy. Chronic lacunar infarct within the right centrum semiovale is identified and appears similar to previous exam. Mild low attenuation throughout the subcortical and periventricular white matter is identified compatible with chronic microvascular disease. No evidence for acute intracranial hemorrhage, cortical infarct or mass. No abnormal extra-axial fluid collections noted. The paranasal sinuses and the mastoid air cells are clear. The calvarium is intact. IMPRESSION: 1. No acute intracranial abnormalities. 2. Chronic small vessel ischemic disease and brain atrophy. These results were called by telephone at the time of interpretation on 10/22/2015 at 9:32 am to Dr. [name] , who verbally acknowledged these results. Electronically Signed   By: Kerby Moors M.D.   On: 10/22/2015 09:32    EKG: Independently reviewed.   Assessment/Plan Present on Admission:  . TIA (transient ischemic attack) . Acute encephalopathy . Adenocarcinoma of left lung, stage 1 ([place]) . CVA (cerebral infarction)  PLAN:  I suspect she has another TIA/CVA of the right  hemiphere.  Will give her ASA suppository, and admit her for speech evaluation.  I spoke with [name] her daughter, and originally, she was going to take her back to SNF, but after confirming dysphagia, I am going to admit her for speech evaluation.  If she is able to take orally, we will add Plavix to her regimen for at least 3 months.  Will give her IVF and made her NPO.  She is a DNR and we will honor her wishes.   Other plans as per orders.  Code Status: DNR.    Orvan Falconer, MD. FACP [facility] Pager [phone] 7pm to 7am.  10/22/2015, 2:24 PM

## 2015-10-22 NOTE — ED Notes (Signed)
Beeped out Code Stroke at [phone].

## 2015-10-22 NOTE — ED Notes (Signed)
Per [facility] staff, pt was given her breakfast tray at 0815 and was last seen normal.  Staff checked on her at [place] and pt was unresponsive with her face in breakfast tray.  Reports they put her in bed, pt was mumbling but not able to speak as normal.  Reports initially pt had no grip in left hand then was able to squeeze hand but grip was very weak in left hand.  Reports pt has residual r sided weakness from an old stroke and is in a wheel chair.  Staff says pt usually verbal but speech is slow from old stroke.

## 2015-10-22 NOTE — ED Provider Notes (Signed)
CSN: [id number]     Arrival date & time 10/22/15  7628 History  By signing my name below, I, Hansel Feinstein, attest that this documentation has been prepared under the direction and in the presence of [name], MD. Electronically Signed: Hansel Feinstein, ED Scribe. 10/22/2015. 9:17 AM.    Chief Complaint  Patient presents with  . Code Stroke    LEVEL 5 CAVEAT: HPI and ROS limited due to AMS   The history is provided by the EMS personnel and a caregiver. The history is limited by the condition of the patient. No language interpreter was used.    HPI Comments: Stephanie [name] is a 74 y.o. female with h/o CVA, stroke, HTN, TIA, pneumothorax, CKD, lung cancer who presents to the Emergency Department due to new onset left-sided weakness and AMS at 8:25AM this morning. Pt is from [facility] center and has residual right-sided weakness and deficits from a prior right-sided stroke. Last seen normal at 8:15AM when her breakfast tray was brought to her. Per [facility] staff, she was checked on at 8:25AM and was found with her face in her tray and unresponsive. Per EMS, pt didn't have grip strength on the right, but had normal grip strength of the left upper extremity en route. Vitals were stable and PERRL en route. CBG was 156. Pt is verbal, follows commands, has slow speech and is in a wheelchair at baseline.   Past Medical History  Diagnosis Date  . CVA (cerebral infarction)     speech difficulities  . Stroke ([place])   . Hypercholesteremia     Takes Zocor daily  . Hypertension     takes Atenolol and Lisinopril daily  . Lung mass     left upper lobe  . Arthritis     hands  . TIA (transient ischemic attack)     Sat before thanksgiving;impaired speech  . Pneumothorax   . Lung cancer ([place])   . CKD (chronic kidney disease), stage III    Past Surgical History  Procedure Laterality Date  . No past surgeries    . Lung biopsy  10/13/11    left  . Abdominal hysterectomy  1975  . Varicose vein surgery   30yr ago  . Video assisted thoracoscopy  11/18/2011    Procedure: VIDEO ASSISTED THORACOSCOPY;  Surgeon: SMelrose Nakayama MD;  [facility] [facility]  Service: Thoracic;  Laterality: Left;  . Lobectomy  11/18/2011    Procedure: LOBECTOMY;  Surgeon: SMelrose Nakayama MD;  Location[place] [facility]  Service: Thoracic;  Laterality: Left;  LEFT UPPER LOBECTOMY  . Breast biopsy  early 2000   Family History  Problem Relation Age of Onset  . Stroke Mother   . Stroke Sister   . Anesthesia problems Neg Hx   . Hypotension Neg Hx   . Malignant hyperthermia Neg Hx   . Pseudochol deficiency Neg Hx   . Diabetes Brother   . Cancer Brother   . Stroke Brother    Social History  Substance Use Topics  . Smoking status: Never Smoker   . Smokeless tobacco: Never Used  . Alcohol Use: No   OB History    No data available     Review of Systems  Unable to perform ROS: Mental status change    Allergies  Review of patient's allergies indicates no known allergies.  Home Medications   Prior to Admission medications   Medication Sig Start Date End Date Taking? Authorizing Provider  ALPRAZolam (Duanne Moron 0.25 MG  tablet Take one tablet by mouth three times daily as needed for anxiety. Monitor for sedation 10/19/15  Yes Tiffany L Reed, DO  aspirin EC 81 MG tablet Take 81 mg by mouth every morning.   Yes Historical Provider, MD  atenolol (TENORMIN) 25 MG tablet Take 25 mg by mouth daily.   Yes Historical Provider, MD  Doxepin HCl 3 MG TABS Take 1 tablet (3 mg total) by mouth at bedtime. 02/18/15  Yes Baird Cancer, PA-C  DULoxetine (CYMBALTA) 60 MG capsule Take 60 mg by mouth daily.   Yes Historical Provider, MD  gefitinib (IRESSA) 250 MG tablet Take 1 tablet (250 mg total) by mouth daily. 10/01/15  Yes Manon Hilding Kefalas, PA-C  metroNIDAZOLE (METROGEL) 0.75 % gel Apply 1 application topically daily.   Yes Historical Provider, MD  simvastatin (ZOCOR) 20 MG tablet Take 1 tablet (20 mg total) by mouth daily.  08/27/14  Yes Kathyrn Drown, MD  traMADol (ULTRAM) 50 MG tablet Take 1 tablet (50 mg total) by mouth every 6 (six) hours as needed. 04/11/15  Yes Fredia Sorrow, MD  acetaminophen (TYLENOL) 325 MG tablet Take 650 mg by mouth every 6 (six) hours as needed.    Historical Provider, MD  diphenoxylate-atropine ([name]) 2.5-0.025 MG per tablet Take 1 tablet by mouth 4 (four) times daily as needed for diarrhea or loose stools. Patient not taking: Reported on 08/20/2015 06/25/14   Farrel Gobble, MD  guaiFENesin-dextromethorphan [facility] DM) 100-10 MG/5ML syrup Take 5 mLs by mouth every 4 (four) hours as needed for cough.    Historical Provider, MD  senna-docusate (SENOKOT-S) 8.6-50 MG per tablet Take 1 tablet by mouth at bedtime as needed for mild constipation. 03/17/15   Radene Gunning, NP   BP 111/49 mmHg  Pulse 54  Temp(Src) 97.4 F (36.3 C) (Oral)  Resp 23  Wt 150 lb (68.04 kg)  SpO2 100% Physical Exam  Constitutional: She appears well-developed and well-nourished. No distress.  HENT:  Head: Normocephalic and atraumatic.  Mouth/Throat: Oropharynx is clear and moist.  Eyes: EOM are normal. Pupils are equal, round, and reactive to light.  Neck: Normal range of motion. Neck supple. No JVD present.  Cardiovascular: Regular rhythm.   Bradycardia  Pulmonary/Chest: Effort normal and breath sounds normal. No stridor. No respiratory distress. She has no wheezes. She has no rales.  Abdominal: Soft. Bowel sounds are normal. She exhibits no distension and no mass. There is no tenderness. There is no rebound and no guarding.  Musculoskeletal: Normal range of motion. She exhibits no edema or tenderness.  Neurological: She is alert.  Patient is nonverbal. Right upper extremity paralysis. Decreased strength in right lower extremity though able to move toes. Patient has mild weakness and left grip strength and moving left lower extremity. Sensation appears to be intact.  Skin: Skin is warm and dry. No  rash noted. No erythema.  Psychiatric: She has a normal mood and affect. Her behavior is normal.  Nursing note and vitals reviewed.   ED Course  Procedures (including critical care time) DIAGNOSTIC STUDIES: Oxygen Saturation is 100% on RA, normal by my interpretation.    COORDINATION OF CARE: 9:11 AM Discussed treatment plan with pt at bedside and pt agreed to plan.   Labs Review Labs Reviewed  CBC - Abnormal; Notable for the following:    RBC 3.77 (*)    All other components within normal limits  COMPREHENSIVE METABOLIC PANEL - Abnormal; Notable for the following:    Glucose, Bld  127 (*)    Creatinine, Ser 1.23 (*)    Calcium 8.8 (*)    Albumin 3.2 (*)    GFR calc non Af Amer 42 (*)    GFR calc Af Amer 49 (*)    All other components within normal limits  URINE RAPID DRUG SCREEN, HOSP PERFORMED - Abnormal; Notable for the following:    Benzodiazepines POSITIVE (*)    All other components within normal limits  URINALYSIS, ROUTINE W REFLEX MICROSCOPIC (NOT AT [facility]) - Abnormal; Notable for the following:    Hgb urine dipstick SMALL (*)    Protein, ur TRACE (*)    All other components within normal limits  URINE MICROSCOPIC-ADD ON - Abnormal; Notable for the following:    Squamous Epithelial / LPF 0-5 (*)    All other components within normal limits  I-STAT CHEM 8, ED - Abnormal; Notable for the following:    Creatinine, Ser 1.20 (*)    Glucose, Bld 122 (*)    All other components within normal limits  PROTIME-INR  APTT  DIFFERENTIAL  I-STAT TROPOININ, ED    Imaging Review Ct Head Wo Contrast  10/22/2015  CLINICAL DATA:  Code stroke EXAM: CT HEAD WITHOUT CONTRAST TECHNIQUE: Contiguous axial images were obtained from the base of the skull through the vertex without intravenous contrast. COMPARISON:  03/16/2015 FINDINGS: There is prominence of the sulci and ventricles identified compatible with brain atrophy. Chronic lacunar infarct within the right centrum semiovale is  identified and appears similar to previous exam. Mild low attenuation throughout the subcortical and periventricular white matter is identified compatible with chronic microvascular disease. No evidence for acute intracranial hemorrhage, cortical infarct or mass. No abnormal extra-axial fluid collections noted. The paranasal sinuses and the mastoid air cells are clear. The calvarium is intact. IMPRESSION: 1. No acute intracranial abnormalities. 2. Chronic small vessel ischemic disease and brain atrophy. These results were called by telephone at the time of interpretation on 10/22/2015 at 9:32 am to Dr. [name] , who verbally acknowledged these results. Electronically Signed   By: Kerby Moors M.D.   On: 10/22/2015 09:32   I have personally reviewed and evaluated these images and lab results as part of my medical decision-making.   EKG Interpretation   Date/Time:  Thursday October 22 2015 09:22:29 EST Ventricular Rate:  54 PR Interval:    QRS Duration: 107 QT Interval:  494 QTC Calculation: 468 R Axis:   76 Text Interpretation:  Junctional rhythm Probable left ventricular  hypertrophy Confirmed by Lita Mains  MD, Hiroyuki Ozanich ([id number]) on 10/22/2015  11:33:09 AM      MDM   Final diagnoses:  Weakness of left upper extremity    I personally performed the services described in this documentation, which was scribed in my presence. The recorded information has been reviewed and is accurate.   Evaluated by neurology. Symptoms have improved. I think that the patient's appropriate for TPA. Family bedside. Will admit to [facility] hospitalist for further workup.   [name], MD 10/22/15 1259

## 2015-10-22 NOTE — ED Notes (Signed)
Called [facility] @ [phone].

## 2015-10-23 ENCOUNTER — Inpatient Hospital Stay
Admission: RE | Admit: 2015-10-23 | Discharge: 2015-10-30 | Disposition: A | Discharge status: Nursing Home (Includes ICF) | Payer: Medicare Other | Source: Ambulatory Visit | Attending: Internal Medicine | Admitting: Internal Medicine

## 2015-10-23 DIAGNOSIS — R1310 Dysphagia, unspecified: Secondary | ICD-10-CM | POA: Diagnosis not present

## 2015-10-23 DIAGNOSIS — G459 Transient cerebral ischemic attack, unspecified: Secondary | ICD-10-CM | POA: Diagnosis not present

## 2015-10-23 DIAGNOSIS — G9340 Encephalopathy, unspecified: Secondary | ICD-10-CM | POA: Diagnosis not present

## 2015-10-23 LAB — MRSA PCR SCREENING: MRSA BY PCR: POSITIVE — AB

## 2015-10-23 MED ORDER — MUPIROCIN 2 % EX OINT
1.0000 "application " | TOPICAL_OINTMENT | Freq: Two times a day (BID) | CUTANEOUS | Status: DC
  Administered 2015-10-23: 1 via NASAL
  Filled 2015-10-23: qty 22

## 2015-10-23 MED ORDER — CHLORHEXIDINE GLUCONATE CLOTH 2 % EX PADS
6.0000 | MEDICATED_PAD | Freq: Every day | CUTANEOUS | Status: DC
  Administered 2015-10-23: 6 via TOPICAL

## 2015-10-23 NOTE — Care Management Note (Signed)
Case Management Note  Patient Details  Name: Stephanie [name] MRN: [id number] Date of Birth: February 05, 1941  Subjective/Objective:                  Pt admitted with TIA from [facility] where she is a long-term resident.  Action/Plan: Pt will return to [facility] today. CSW is aware and will arrange for return to facility. No CM needs.   Expected Discharge Date:  10/24/15               Expected Discharge Plan:  Skilled Nursing Facility  In-House Referral:  Clinical Social Work  Discharge planning Services  CM Consult  Post Acute Care Choice:  NA Choice offered to:  NA  DME Arranged:    DME Agency:     HH Arranged:    [place] Agency:     Status of Service:  Completed, signed off  Medicare Important Message Given:    Date Medicare IM Given:    Medicare IM give by:    Date Additional Medicare IM Given:    Additional Medicare Important Message give by:     If discussed at [place] of Stay Meetings, dates discussed:    Additional Comments:  Sherald Barge, RN 10/23/2015, 10:54 AM

## 2015-10-23 NOTE — Clinical Social Work Note (Signed)
Clinical Social Work Assessment  Patient Details  Name: Stephanie [name] MRN: [id number] Date of Birth: 05-Mar-1941  Date of referral:  10/23/15               Reason for consult:  Discharge Planning                Permission sought to share information with:  Family Supports Permission granted to share information::  Yes, Verbal Permission Granted  Name::        Agency::     Relationship::  daughter  Contact Information:     Housing/Transportation Living arrangements for the past 2 months:  [place] of Information:  Patient, Adult Children Patient Interpreter Needed:  None Criminal Activity/Legal Involvement Pertinent to Current Situation/Hospitalization:  No - Comment as needed Significant Relationships:  Adult Children Lives with:  Facility Resident Do you feel safe going back to the place where you live?  Yes Need for family participation in patient care:  Yes (Comment)  Care giving concerns:  Pt is long term resident at [facility].    Social Worker assessment / plan:  CSW attempted to meet with pt at bedside, but she requested that [place] speak with her daughter. Per Tami at [facility], pt has been resident at facility for over a year. She is nursing level of care and okay to return. At baseline, pt is a limited assist and primarily uses wheelchair. She can ambulate with a walker and stand by assist. Facility requested PT evaluation to see if pt would qualify for skilled. No follow up recommended. [facility] notified and aware of d/c today. No FL2 needed per facility. Ambrose Pancoast, CSW spoke with pt's daughter and confirmed plan for return to [facility].   Employment status:  Retired Nurse, adult PT Recommendations:  No Follow Up Information / Referral to community resources:  Other (Comment Required) (return to [facility])  Patient/Family's Response to care:  Pt to return to [facility].   Patient/Family's Understanding of and Emotional Response to Diagnosis, Current  Treatment, and Prognosis:  Pt returning to [facility] today.   Emotional Assessment Appearance:  Appears older than stated age Attitude/Demeanor/Rapport:  Unable to Assess Affect (typically observed):  Unable to Assess Orientation:    Alcohol / Substance use:  Not Applicable Psych involvement (Current and /or in the community):  No (Comment)  Discharge Needs  Concerns to be addressed:  Discharge Planning Concerns Readmission within the last 30 days:  No Current discharge risk:  None Barriers to Discharge:  No Barriers Identified   Salome Arnt, [place] 10/23/2015, 2:44 PM [phone]

## 2015-10-23 NOTE — Progress Notes (Signed)
Report called to [name] at [facility] center. Patient ready for discharge. Brother at bedside.

## 2015-10-23 NOTE — Evaluation (Addendum)
Physical Therapy Evaluation Patient Details Name: Stephanie [name] MRN: [id number] DOB: June 06, 1941 Today's Date: 10/23/2015   History of Present Illness  Stephanie [name] is an 74 y.o. 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 She was seen by Teleneurologist, and deemed not a candidate for her symptoms. She improved in the ER, and work up included a head CT showeing no acute process. Her CBG, renal Fx test, and Hb were unremarakble. Her EKG showed NSR with no acute ST T changes. She failed her swallow test, and coughed with drinking thru a straw. Hospitalist was asked to admit her for TIA/CVA.   Clinical Impression  Pt is a resident at [facility].  Pt ambulatory for short distances with assist only.  Pt is at prior functional level.    Follow Up Recommendations  none    Equipment Recommendations    none   Recommendations for Other Services   none    Precautions / Restrictions Precautions Precautions: Fall Restrictions Weight Bearing Restrictions: No      Mobility  Bed Mobility Overal bed mobility: Needs Assistance Bed Mobility: Rolling;Sidelying to Sit Rolling: Supervision Sidelying to sit: Min assist          Transfers Overall transfer level: Needs assistance Equipment used: Standard walker Transfers: Sit to/from Stand;Stand Pivot Transfers Sit to Stand: Mod assist;Max assist (mod assist from higher level; max from lower chairs ) Stand pivot transfers: Min assist          Ambulation/Gait Ambulation/Gait assistance: Min assist Ambulation Distance (Feet): 20 Feet Assistive device: Standard walker Gait Pattern/deviations: Step-to pattern;Decreased step length - left;Decreased step length - right   Gait velocity interpretation: Below normal speed for age/gender        Balance Overall balance assessment: Needs  assistance Sitting-balance support: Single extremity supported Sitting balance-Leahy Scale: Fair     Standing balance support: Bilateral upper extremity supported Standing balance-Leahy Scale: Poor                               Pertinent Vitals/Pain Pain Assessment: No/denies pain    Home Living Family/patient expects to be discharged to:: Skilled nursing facility                      Prior Function Level of Independence: Needs assistance   Gait / Transfers Assistance Needed: ambulates short distances with HHA but is primarily w/c dependent  ADL's / Homemaking Assistance Needed: unknow        Hand Dominance   Dominant Hand: Right    Extremity/Trunk Assessment               Lower Extremity Assessment: Overall WFL for tasks assessed         Communication   Communication: Expressive difficulties  Cognition Arousal/Alertness: Awake/alert Behavior During Therapy: WFL for tasks assessed/performed Overall Cognitive Status: Within Functional Limits for tasks assessed                               Assessment/Plan    PT Assessment [place] does not need any further PT services  PT Diagnosis     PT Problem List    PT Treatment Interventions     PT Goals (Current goals  can be found in the Care Plan section) Acute Rehab PT Goals PT Goal Formulation: Patient unable to participate in goal setting               End of Session Equipment Utilized During Treatment: Gait belt Activity Tolerance: Patient tolerated treatment well Patient left: in chair;with call bell/phone within reach      Functional Limitation: Mobility: Walking and moving around Mobility: Walking and Moving Around Current Status (Q9826): At least 60 percent but less than 80 percent impaired, limited or restricted Mobility: Walking and Moving Around Goal Status [phone]): At least 60 percent but less than 80 percent impaired, limited or restricted Mobility: Walking  and Moving Around Discharge Status [phone]): At least 60 percent but less than 80 percent impaired, limited or restricted    Time: 0900-0932 PT Time Calculation (min) (ACUTE ONLY): 32 min   Charges:   PT Evaluation $Initial PT Evaluation Tier I: 1 Procedure     PT G Codes:   PT G-Codes **NOT FOR INPATIENT CLASS** Functional Limitation: Mobility: Walking and moving around Mobility: Walking and Moving Around Current Status (W8088): At least 60 percent but less than 80 percent impaired, limited or restricted Mobility: Walking and Moving Around Goal Status [phone]): At least 60 percent but less than 80 percent impaired, limited or restricted Mobility: Walking and Moving Around Discharge Status [phone]): At least 60 percent but less than 80 percent impaired, limited or restricted    Rayetta Humphrey, PT CLT [phone] 10/23/2015, 9:36 AM

## 2015-10-23 NOTE — Discharge Summary (Signed)
Physician Discharge Summary  Stephanie [name] WCB:[id number] DOB: 05-16-1941 DOA: 10/22/2015  PCP: [name], MD  Admit date: 10/22/2015 Discharge date: 10/23/2015  Time spent: 35  minutes  Recommendations for Outpatient Follow-up:   1. Follow up with PCP and Speech therapy is recommended.    Discharge Diagnoses:  Principal Problem:   TIA (transient ischemic attack) Active Problems:   CVA (cerebral infarction)   Adenocarcinoma of left lung, stage 1 (HCC)   Acute encephalopathy   Dysphagia   Discharge Condition: Improved.  Able to eat and is alert.   Diet recommendation: Dysphagia 3, Thin liquid, with cautions outlined.   Filed Weights   10/22/15 0924 10/22/15 1600  Weight: 68.04 kg (150 lb) 63.912 kg (140 lb 14.4 oz)    History of present illness: Patient was admitted by me for possible TIA/CVA on 10/22/15.  As per my prior H and P:  " Stephanie [name] is an 74 y.o. 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 She was seen by Teleneurologist, and deemed not a candidate for her symptoms. She improved in the ER, and work up included a head CT showeing no acute process. Her CBG, renal Fx test, and Hb were unremarakble. Her EKG showed NSR with no acute ST T changes. She failed her swallow test, and coughed with drinking thru a straw. Hospitalist was asked to admit her for TIA/CVA.   Hospital Course: Patient was admitted and made strict NPO.  She was continue on ASA rectally.  Her MRI showed no acute CVA, and speech was consulted.  The recommendation is as followed:    Diet Recommendation Dysphagia 3 (Mech soft);Thin liquid   Liquid Administration via: Straw Medication Administration: Crushed with puree Supervision: Full supervision/cueing for compensatory strategies Compensations: Slow rate;Small sips/bites;Multiple dry  swallows after each bite/sip Postural Changes: Seated upright at 90 degrees;Remain upright for at least 30 minutes after po intake    Other Recommendations Oral Care Recommendations: Oral care BID;Staff/trained caregiver to provide oral care Other Recommendations: Clarify dietary restrictions   Follow up Recommendations  24 hour supervision/assistance        She will be discharged back to her SNF.  During this hospitalization, it was determined that she is a DNR, with no expiration date, unless she changes her directive.  Thank you and [place].     Procedures:  Speech Evaluation, and Head MRI.   Consultations:  Speech therapy.   Discharge Exam: Filed Vitals:   10/23/15 0400 10/23/15 0841  BP: 123/86 138/61  Pulse: 61 69  Temp: 98.2 F (36.8 C) 97.5 F (36.4 C)  Resp: 18 18      Discharge Instructions    Current Discharge Medication List    CONTINUE these medications which have NOT CHANGED   Details  ALPRAZolam (XANAX) 0.25 MG tablet Take one tablet by mouth three times daily as needed for anxiety. Monitor for sedation Qty: 90 tablet, Refills: 0    aspirin EC 81 MG tablet Take 81 mg by mouth every morning.    atenolol (TENORMIN) 25 MG tablet Take 25 mg by mouth daily.    Doxepin HCl 3 MG TABS Take 1 tablet (3 mg total) by mouth at bedtime. Qty: 30 tablet, Refills: 2   Associated Diagnoses: Insomnia    DULoxetine (CYMBALTA) 60 MG capsule Take 60 mg by mouth  daily.    gefitinib (IRESSA) 250 MG tablet Take 1 tablet (250 mg total) by mouth daily. Qty: 30 tablet, Refills: 6   Associated Diagnoses: Adenocarcinoma of left lung, stage 1 (HCC)    metroNIDAZOLE (METROGEL) 0.75 % gel Apply 1 application topically daily.    simvastatin (ZOCOR) 20 MG tablet Take 1 tablet (20 mg total) by mouth daily. Qty: 30 tablet, Refills: 5    traMADol (ULTRAM) 50 MG tablet Take 1 tablet (50 mg total) by mouth every 6 (six) hours as needed. Qty: 15 tablet, Refills: 0     acetaminophen (TYLENOL) 325 MG tablet Take 650 mg by mouth every 6 (six) hours as needed.    diphenoxylate-atropine ([name]) 2.5-0.025 MG per tablet Take 1 tablet by mouth 4 (four) times daily as needed for diarrhea or loose stools. Qty: 30 tablet, Refills: 5    guaiFENesin-dextromethorphan (ROBITUSSIN DM) 100-10 MG/5ML syrup Take 5 mLs by mouth every 4 (four) hours as needed for cough.    senna-docusate (SENOKOT-S) 8.6-50 MG per tablet Take 1 tablet by mouth at bedtime as needed for mild constipation.       No Known Allergies    The results of significant diagnostics from this hospitalization (including imaging, microbiology, ancillary and laboratory) are listed below for reference.    Significant Diagnostic Studies: Ct Head Wo Contrast  10/22/2015  CLINICAL DATA:  Code stroke EXAM: CT HEAD WITHOUT CONTRAST TECHNIQUE: Contiguous axial images were obtained from the base of the skull through the vertex without intravenous contrast. COMPARISON:  03/16/2015 FINDINGS: There is prominence of the sulci and ventricles identified compatible with brain atrophy. Chronic lacunar infarct within the right centrum semiovale is identified and appears similar to previous exam. Mild low attenuation throughout the subcortical and periventricular white matter is identified compatible with chronic microvascular disease. No evidence for acute intracranial hemorrhage, cortical infarct or mass. No abnormal extra-axial fluid collections noted. The paranasal sinuses and the mastoid air cells are clear. The calvarium is intact. IMPRESSION: 1. No acute intracranial abnormalities. 2. Chronic small vessel ischemic disease and brain atrophy. These results were called by telephone at the time of interpretation on 10/22/2015 at 9:32 am to Dr. [name] , who verbally acknowledged these results. Electronically Signed   By: Kerby Moors M.D.   On: 10/22/2015 09:32   Mr Brain Wo Contrast  10/22/2015  CLINICAL DATA:   Acute onset of left-sided weakness.  Stroke. EXAM: MRI HEAD WITHOUT CONTRAST TECHNIQUE: Multiplanar, multiecho pulse sequences of the brain and surrounding structures were obtained without intravenous contrast. COMPARISON:  Head CT same day.  MRI 09/19/2011. FINDINGS: The study does suffer from motion degradation. Diffusion imaging does not show any acute or subacute infarction. There is generalized brain atrophy. There chronic small-vessel changes affecting the pons. No cerebellar insult. The cerebral hemispheres show moderate changes of chronic small vessel disease within the deep and subcortical white matter. No large vessel territory infarction. No mass lesion, hemorrhage, hydrocephalus or extra-axial collection. No pituitary mass. No inflammatory sinus disease. No skull or skullbase lesion. Electronically Signed   By: Nelson Chimes M.D.   On: 10/22/2015 17:43    Microbiology: Recent Results (from the past 240 hour(s))  MRSA PCR Screening     Status: Abnormal   Collection Time: 10/22/15 10:22 PM  Result Value Ref Range Status   MRSA by PCR POSITIVE (A) NEGATIVE Final    Comment:        The GeneXpert MRSA Assay (FDA approved for NASAL specimens only),  is one component of a comprehensive MRSA colonization surveillance program. It is not intended to diagnose MRSA infection nor to guide or monitor treatment for MRSA infections. RESULT CALLED TO, READ BACK BY AND VERIFIED WITH: THOMAS K AT 0100 ON [id number] BY FORSYTH K      Labs: Basic Metabolic Panel:  Recent Labs Lab 10/22/15 0909 10/22/15 0947  NA 137 140  K 4.2 4.1  CL 105 104  CO2 27  --   GLUCOSE 127* 122*  BUN 17 16  CREATININE 1.23* 1.20*  CALCIUM 8.8*  --    Liver Function Tests:  Recent Labs Lab 10/22/15 0909  AST 27  ALT 16  ALKPHOS 85  BILITOT 0.7  PROT 6.9  ALBUMIN 3.2*   CBC:  Recent Labs Lab 10/22/15 0909 10/22/15 0947  WBC 4.7  --   NEUTROABS 2.7  --   HGB 12.5 12.6  HCT 36.9 37.0  MCV 97.9   --   PLT 159  --     SignedOrvan Falconer MD  FACP  [facility] 10/23/2015, 11:57 AM

## 2015-10-23 NOTE — Clinical Social Work Note (Signed)
CSW facilitated discharge.  CSW sent clinicals to [facility] via hub.  CSW spoke with patient's daughter, [name], and advised that patient was being discharged and would be transported back to [facility] via hospital staff.   CSW spoke with Keri at [facility] and advised that patient was being discharged and would be transported to the facility via hospital staff.   CSW signing off.   Ihor Gully, [place] [phone]

## 2015-10-26 ENCOUNTER — Non-Acute Institutional Stay (SKILLED_NURSING_FACILITY): Payer: Medicare Other | Admitting: Internal Medicine

## 2015-10-26 DIAGNOSIS — I129 Hypertensive chronic kidney disease with stage 1 through stage 4 chronic kidney disease, or unspecified chronic kidney disease: Secondary | ICD-10-CM

## 2015-10-26 DIAGNOSIS — I69322 Dysarthria following cerebral infarction: Secondary | ICD-10-CM

## 2015-10-26 DIAGNOSIS — J8410 Pulmonary fibrosis, unspecified: Secondary | ICD-10-CM | POA: Diagnosis not present

## 2015-10-26 DIAGNOSIS — I10 Essential (primary) hypertension: Secondary | ICD-10-CM

## 2015-10-26 DIAGNOSIS — I119 Hypertensive heart disease without heart failure: Secondary | ICD-10-CM | POA: Diagnosis not present

## 2015-10-26 DIAGNOSIS — G451 Carotid artery syndrome (hemispheric): Secondary | ICD-10-CM | POA: Diagnosis not present

## 2015-10-26 DIAGNOSIS — IMO0001 Reserved for inherently not codable concepts without codable children: Secondary | ICD-10-CM

## 2015-10-26 DIAGNOSIS — I340 Nonrheumatic mitral (valve) insufficiency: Secondary | ICD-10-CM | POA: Diagnosis not present

## 2015-10-26 NOTE — Progress Notes (Signed)
Patient ID: Stephanie [name], female   DOB: 1941-05-27, 74 y.o.   MRN: [id number]    Facility; [facility] Chief complaint readmission to the facility post stay at [name] overnight12/22 through 10/23/2015 History; this is a patient who is admitted herea year ago. That time she was admitted with shortness of breath cough, a CT scan of her chests suggested nodules in the right lung, chronic fibrotic change questionable mild CHF.Her major disability is dysarthriaand right arm greater than leg weakness felt to be secondary to a dominant hemisphere stroke some 2 years before she came here.Normally the patient can communicate around most things. Is able to push herself around in the building in a wheelchair.  On this occasion she was sent out with left-sided weakness. MRI showed no acute stroke and she appeared to come back to her normal status. She has been returned here. I've spoken to the nursing staff they feel she is right back to where she was before  Past Medical History  Diagnosis Date  . CVA (cerebral infarction)     speech difficulities  . Stroke (Stephanie [name])   . Hypercholesteremia     Takes Zocor daily  . Hypertension     takes Atenolol and Lisinopril daily  . Lung mass     left upper lobe  . Arthritis     hands  . TIA (transient ischemic attack)     Sat before thanksgiving;impaired speech  . Pneumothorax   . Lung cancer (Stephanie [name])   . CKD (chronic kidney disease), stage III    Past Surgical History  Procedure Laterality Date  . No past surgeries    . Lung biopsy  10/13/11    left  . Abdominal hysterectomy  1975  . Varicose vein surgery  30yr ago  . Video assisted thoracoscopy  11/18/2011    Procedure: VIDEO ASSISTED THORACOSCOPY;  Surgeon: SMelrose Nakayama MD;  [facility] [facility]  Service: Thoracic;  Laterality: Left;  . Lobectomy  11/18/2011    Procedure: LOBECTOMY;  Surgeon: SMelrose Nakayama MD;  [facility] [facility]  Service: Thoracic;  Laterality: Left;  LEFT UPPER LOBECTOMY  .  Breast biopsy  early 2000    Current Outpatient Prescriptions on File Prior to Visit  Medication Sig Dispense Refill  . acetaminophen (TYLENOL) 325 MG tablet Take 650 mg by mouth every 6 (six) hours as needed.    . ALPRAZolam (XANAX) 0.25 MG tablet Take one tablet by mouth three times daily as needed for anxiety. Monitor for sedation 90 tablet 0  . aspirin EC 81 MG tablet Take 81 mg by mouth every morning.    .Marland Kitchenatenolol (TENORMIN) 25 MG tablet Take 25 mg by mouth daily.    . diphenoxylate-atropine ([name]) 2.5-0.025 MG per tablet Take 1 tablet by mouth 4 (four) times daily as needed for diarrhea or loose stools. (Patient not taking: Reported on 08/20/2015) 30 tablet 5  . Doxepin HCl 3 MG TABS Take 1 tablet (3 mg total) by mouth at bedtime. 30 tablet 2  . DULoxetine (CYMBALTA) 60 MG capsule Take 60 mg by mouth daily.    .Marland Kitchengefitinib (IRESSA) 250 MG tablet Take 1 tablet (250 mg total) by mouth daily. 30 tablet 6  . guaiFENesin-dextromethorphan (ROBITUSSIN DM) 100-10 MG/5ML syrup Take 5 mLs by mouth every 4 (four) hours as needed for cough.    . metroNIDAZOLE (METROGEL) 0.75 % gel Apply 1 application topically daily.    .Marland Kitchensenna-docusate (SENOKOT-S) 8.6-50 MG per tablet Take 1 tablet by  mouth at bedtime as needed for mild constipation.    . simvastatin (ZOCOR) 20 MG tablet Take 1 tablet (20 mg total) by mouth daily. 30 tablet 5  . traMADol (ULTRAM) 50 MG tablet Take 1 tablet (50 mg total) by mouth every 6 (six) hours as needed. 15 tablet 0     Social is noted the patient is a chronic patient here. Her speech is normally dysarthric but she can often make her self understood I've always wondered whether there is also some evidence of cortically baseline which problem. Neverthelesnot seem to be different. She is a full code  Family History  Problem Relation Age of Onset  . Stroke Mother   . Stroke Sister   . Anesthesia problems Neg Hx   . Hypotension Neg Hx   . Malignant hyperthermia Neg Hx     . Pseudochol deficiency Neg Hx   . Diabetes Brother   . Cancer Brother   . Stroke Brother     Review of systems; not really possible due to language disturbance  Physical examination Gen. Patient is not in any distress during up in her wheelchair HEENT; probable cataractsbilaterally Respiratoryby basilar crackles left greater than right Cardiac; grade 3/6 pan systolic ejection murmur on the right sternal border. 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 Right arm weaknessgreater than right leg. This is not new. I can detect nothing on the left side. Her speech is dysarthric, halting. 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 I don't have a good sense of her baseline  Impression/plan #1patient's signed out as having a TIA, per discussion with the nurses who know her well today they do not feel there is any ongoing change in her neurologic status with mostly cortical language, dysarthria and right-sided weakness None of this is new and dates back several years She does have a history of carotid artery disease. In 2014Dopplerof the left internal carotid showed 58-69% stenosis. The right did not show significant stenosis. She was discharged back on aspirin 81 mg which she was on previously. Considerchanging her to another agent #2 history of chronic fibrotic change in her bilateral lower lobes. I suspect this is what I'm hearing on auscultation rather than interstitial edema. CT scan of the chest done in November showed bilateral pulmonary fibrosis #3 pansystolic murmur either mitral or tricuspid regurgitation perhaps both Echo in 2014 showed this. She has no evidence of congestive  heart failure #4 history of no CVA of the lung status post thoracoscopic left upper lobe lobectomy and mediastinal lymph node dissection in January 2013. The actual status of this is not completely clear #5chronic renal failure #6 left ventricular hypertrophy secondary to hypertension noted on an echocardiogram in 2014. This will need to be followed  BMP Latest Ref Rng 10/22/2015 10/22/2015 09/16/2015  Glucose 65 - 99 mg/dL 122(H) 127(H) 116(H)  BUN 6 - 20 mg/dL 16 17 21(H)  Creatinine 0.44 - 1.00 mg/dL 1.20(H) 1.23(H) 1.21(H)  Sodium 135 - 145 mmol/L 140 137 139  Potassium 3.5 - 5.1 mmol/L 4.1 4.2 3.9  Chloride 101 - 111 mmol/L 104 105 106  CO2 22 - 32 mmol/L - 27 26  Calcium 8.9 - 10.3 mg/dL - 8.8(L) 9.3    CBC Latest Ref Rng 10/22/2015  10/22/2015 09/16/2015  WBC 4.0 - 10.5 K/uL - 4.7 5.8  Hemoglobin 12.0 - 15.0 g/dL 12.6 12.5 14.2  Hematocrit 36.0 - 46.0 % 37.0 36.9 42.2  Platelets 150 - 400 K/uL - 159 235                           *[facility] [place], [place] [id number]                            [phone]  ------------------------------------------------------------------- Transthoracic Echocardiography  Patient:    Stephanie [name], Stephanie [name] MR #:       [id number] Study Date: 10/16/2014 Gender:     F Age:        21 Height:     170.2 cm Weight:     68 kg BSA:        1.8 m^2 Pt. Status: Room:       A308   ATTENDING    [place], RDCS  ADMITTING    Orpah Greek     Turley, Karen M  REFERRING    Black, Lezlie Octave  PERFORMING   [facility], Deneise Lever [facility]  cc:  ------------------------------------------------------------------- LV EF: 65% -   70%  ------------------------------------------------------------------- Indications:      Dyspnea 786.09.  ------------------------------------------------------------------- History:   PMH:   Congestive heart failure.   Stroke.  Functional status:   Renal failure.  Risk factors:  lung cancer. Hypertension. Dyslipidemia.  ------------------------------------------------------------------- Study Conclusions  - Left ventricle: The cavity size was below normal. Wall thickness   was increased in a pattern of moderate LVH. 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 However, waveform and myocardial   appearance favor diagnosis of hypertrophic cardiomyopathy.   Valsalva maneuver could not be performed. Systolic function was   vigorous. The estimated ejection fraction was in the range of 65%   to 70%. Wall motion was normal; there were no regional wall   motion abnormalities. Findings consistent with left ventricular   diastolic dysfunction. Doppler parameters are consistent with   both elevated ventricular end-diastolic filling pressure and   elevated left atrial filling pressure. Medial E/e&' 35.4. - Aortic valve: Increased velocities across LVOT likely due to   hyperdynamic function with late systolic anterior motion of the   mitral leaflet, and less likely due to signficant aortic stenosis   based on parasternal short axis views. Peak velocity (S): 282   cm/s. Mean gradient (S): 21 mm Hg. - Mitral valve: Moderately calcified annulus. Late systolic   anterior motion of the mitral leaflet. There was severe   regurgitation. - Left atrium: The atrium was severely dilated. Volume/bsa, ES   (1-plane Simpson&'s, A4C): 49.4 ml/m^2. - Right atrium: The atrium was mildly dilated. - Atrial septum: A patent foramen ovale cannot be excluded. - Tricuspid valve: There was moderate regurgitation. - Pulmonary arteries: PA peak pressure: 44 mm Hg (S). Mild to   moderately elevated pulmonary pressures.  Transthoracic echocardiography.  M-mode, complete 2D, spectral Doppler, and color Doppler.  Birthdate:  Patient birthdate:  02/10/1941.  Age:  Patient is 74 yr  old.  Sex:  Gender: female. BMI: 23.5 kg/m^2.  Blood pressure:     129/47  Patient status: Inpatient.  Study date:  Study date: 10/16/2014. Study time: 11:23 AM.  Location:  Bedside.  -------------------------------------------------------------------  ------------------------------------------------------------------- Left ventricle:  The cavity size was below normal. Wall thickness was increased in a pattern of moderate LVH. Moderate wall thickening indicative of hypertensive cardiomyopathy vs hypertrophic cardiomyopathy. However, waveform and myocardial appearance favor diagnosis of hypertrophic cardiomyopathy. Valsalva maneuver could not be performed. Systolic function was vigorous. The estimated ejection fraction was in the range of 65% to 70%. Wall motion was normal; there were no regional wall motion abnormalities. Findings consistent with left ventricular diastolic dysfunction. Doppler parameters are consistent with both elevated ventricular end-diastolic filling pressure and elevated left atrial filling pressure. Medial E/e&' 35.4.  ------------------------------------------------------------------- Aortic valve:   Trileaflet.  Doppler:  There was no regurgitation. Increased velocities across LVOT likely due to hyperdynamic function with late systolic anterior motion of the mitral leaflet, and less likely due to signficant aortic stenosis based on parasternal short axis views.    Mean gradient (S): 21 mm Hg. Peak gradient (S): 32 mm Hg.  ------------------------------------------------------------------- Aorta:  Aortic root: The aortic root was normal in size.  ------------------------------------------------------------------- Mitral valve:   Moderately calcified annulus. Late systolic anterior motion of the mitral leaflet.  Doppler:  There was severe regurgitation.    Peak gradient (D): 9 mm Hg.  ------------------------------------------------------------------- Left  atrium:  The atrium was severely dilated.  ------------------------------------------------------------------- Atrial septum:  A patent foramen ovale cannot be excluded.  ------------------------------------------------------------------- Right ventricle:  The cavity size was normal. Wall thickness was normal. Systolic function was normal.  ------------------------------------------------------------------- Pulmonic valve:    The valve appears to be grossly normal. Doppler:  There was no significant regurgitation.  ------------------------------------------------------------------- Tricuspid valve:   Normal thickness leaflets.  Doppler:  There was moderate regurgitation.  ------------------------------------------------------------------- Pulmonary artery:   Mild to moderately elevated pulmonary pressures.  ------------------------------------------------------------------- Right atrium:  The atrium was mildly dilated.  ------------------------------------------------------------------- Pericardium:  There was no pericardial effusion.  ------------------------------------------------------------------- Systemic veins:  Poorly visualized, but appears normal in caliber.  ------------------------------------------------------------------- Measurements   Left ventricle                              Value        Reference  LV ID, ED, PLAX chordal             (L)     31.2  mm     43 - 52  LV ID, ES, PLAX chordal             (L)     17.7  mm     23 - 38  LV fx shortening, PLAX chordal              43    %      >=29  LV PW thickness, ED                         13.4  mm     ---------  IVS/LV PW ratio, ED                         1.06         <=1.3  LV e&', lateral  4.6   cm/s   ---------  LV E/e&', lateral                            31.74        ---------  LV e&', medial                               4.1   cm/s   ---------  LV E/e&', medial                              35.61        ---------  LV e&', average                              4.35  cm/s   ---------  LV E/e&', average                            33.56        ---------    Ventricular septum                          Value        Reference  IVS thickness, ED                           14.2  mm     ---------    LVOT                                        Value        Reference  LVOT ID, S                                  18    mm     ---------  LVOT area                                   2.54  cm^2   ---------    Aortic valve                                Value        Reference  Aortic valve peak velocity, S               282   cm/s   ---------  Aortic valve mean velocity, S               221   cm/s   ---------  Aortic valve VTI, S                         81.7  cm     ---------  Aortic mean gradient, S                     21    mm Hg  ---------  Aortic peak  gradient, S                     32    mm Hg  ---------    Aorta                                       Value        Reference  Aortic root ID, ED                          33    mm     ---------    Left atrium                                 Value        Reference  LA ID, A-P, ES                              42    mm     ---------  LA ID/bsa, A-P                      (H)     2.33  cm/m^2 <=2.2  LA volume, ES, 1-p A4C                      88.8  ml     ---------  LA volume/bsa, ES, 1-p A4C                  49.4  ml/m^2 ---------  LA volume, ES, 1-p A2C                      57.4  ml     ---------  LA volume/bsa, ES, 1-p A2C                  31.9  ml/m^2 ---------    Mitral valve                                Value        Reference  Mitral E-wave peak velocity                 146   cm/s   ---------  Mitral A-wave peak velocity                 131   cm/s   ---------  Mitral deceleration time            (H)     271   ms     150 - 230  Mitral peak gradient, D                     9     mm Hg  ---------  Mitral E/A ratio, peak                       1.1          ---------  Mitral maximal regurg velocity,             899   cm/s   ---------  PISA  Mitral regurg VTI, PISA                     189   cm     ---------    Pulmonary arteries                          Value        Reference  PA pressure, S, DP                  (H)     44    mm Hg  <=30    Tricuspid valve                             Value        Reference  Tricuspid regurg peak velocity              320   cm/s   ---------  Tricuspid peak RV-RA gradient               41    mm Hg  ---------    Systemic veins                              Value        Reference  Estimated CVP                               3     mm Hg  ---------    Right ventricle                             Value        Reference  RV pressure, S, DP                  (H)     44    mm Hg  <=30  RV s&', lateral, S                           17.3  cm/s   ---------  Legend: (L)  and  (H)  mark values outside specified reference range.  ------------------------------------------------------------------- Prepared and Electronically Authenticated by  Kate Sable, MD 2015-12-17T13:36:23   EXAM: MRI HEAD WITHOUT CONTRAST   TECHNIQUE: Multiplanar, multiecho pulse sequences of the brain and surrounding structures were obtained without intravenous contrast.   COMPARISON:  Head CT same day.  MRI 09/19/2011.   FINDINGS: The study does suffer from motion degradation. Diffusion imaging does not show any acute or subacute infarction.   There is generalized brain atrophy. There chronic small-vessel changes affecting the pons. No cerebellar insult. The cerebral hemispheres show moderate changes of chronic small vessel disease within the deep and subcortical white matter. No large vessel territory infarction. No mass lesion, hemorrhage, hydrocephalus or extra-axial collection. No pituitary mass. No inflammatory sinus disease. No skull or skullbase lesion.     Electronically Signed   By: Nelson Chimes  M.D.   On: 10/22/2015 17:43

## 2015-10-30 ENCOUNTER — Emergency Department (HOSPITAL_COMMUNITY)
Admission: EM | Admit: 2015-10-30 | Discharge: 2015-10-30 | Disposition: A | Discharge status: Home / Self Care | Payer: Medicare Other | Attending: Emergency Medicine | Admitting: Emergency Medicine

## 2015-10-30 ENCOUNTER — Emergency Department (HOSPITAL_COMMUNITY): Payer: Medicare Other

## 2015-10-30 ENCOUNTER — Encounter (HOSPITAL_COMMUNITY): Payer: Self-pay | Admitting: Emergency Medicine

## 2015-10-30 ENCOUNTER — Non-Acute Institutional Stay (SKILLED_NURSING_FACILITY): Payer: Medicare Other | Admitting: Internal Medicine

## 2015-10-30 DIAGNOSIS — I129 Hypertensive chronic kidney disease with stage 1 through stage 4 chronic kidney disease, or unspecified chronic kidney disease: Secondary | ICD-10-CM | POA: Insufficient documentation

## 2015-10-30 DIAGNOSIS — F919 Conduct disorder, unspecified: Secondary | ICD-10-CM | POA: Insufficient documentation

## 2015-10-30 DIAGNOSIS — Z79899 Other long term (current) drug therapy: Secondary | ICD-10-CM | POA: Diagnosis not present

## 2015-10-30 DIAGNOSIS — Z7982 Long term (current) use of aspirin: Secondary | ICD-10-CM | POA: Diagnosis not present

## 2015-10-30 DIAGNOSIS — M1990 Unspecified osteoarthritis, unspecified site: Secondary | ICD-10-CM | POA: Diagnosis not present

## 2015-10-30 DIAGNOSIS — Z8709 Personal history of other diseases of the respiratory system: Secondary | ICD-10-CM | POA: Diagnosis not present

## 2015-10-30 DIAGNOSIS — Z85118 Personal history of other malignant neoplasm of bronchus and lung: Secondary | ICD-10-CM | POA: Insufficient documentation

## 2015-10-30 DIAGNOSIS — Z8673 Personal history of transient ischemic attack (TIA), and cerebral infarction without residual deficits: Secondary | ICD-10-CM | POA: Diagnosis not present

## 2015-10-30 DIAGNOSIS — N183 Chronic kidney disease, stage 3 (moderate): Secondary | ICD-10-CM | POA: Diagnosis not present

## 2015-10-30 DIAGNOSIS — R059 Cough, unspecified: Secondary | ICD-10-CM

## 2015-10-30 DIAGNOSIS — R5383 Other fatigue: Secondary | ICD-10-CM | POA: Diagnosis not present

## 2015-10-30 DIAGNOSIS — R05 Cough: Secondary | ICD-10-CM

## 2015-10-30 DIAGNOSIS — R079 Chest pain, unspecified: Secondary | ICD-10-CM

## 2015-10-30 DIAGNOSIS — R0682 Tachypnea, not elsewhere classified: Secondary | ICD-10-CM | POA: Insufficient documentation

## 2015-10-30 DIAGNOSIS — E7800 Pure hypercholesterolemia, unspecified: Secondary | ICD-10-CM | POA: Diagnosis not present

## 2015-10-30 LAB — CBC WITH DIFFERENTIAL/PLATELET
BASOS ABS: 0 10*3/uL (ref 0.0–0.1)
Basophils Relative: 0 %
EOS PCT: 5 %
Eosinophils Absolute: 0.3 10*3/uL (ref 0.0–0.7)
HEMATOCRIT: 37.8 % (ref 36.0–46.0)
Hemoglobin: 12.5 g/dL (ref 12.0–15.0)
LYMPHS ABS: 1.2 10*3/uL (ref 0.7–4.0)
LYMPHS PCT: 17 %
MCH: 33 pg (ref 26.0–34.0)
MCHC: 33.1 g/dL (ref 30.0–36.0)
MCV: 99.7 fL (ref 78.0–100.0)
Monocytes Absolute: 0.8 10*3/uL (ref 0.1–1.0)
Monocytes Relative: 11 %
NEUTROS ABS: 4.8 10*3/uL (ref 1.7–7.7)
Neutrophils Relative %: 67 %
PLATELETS: 208 10*3/uL (ref 150–400)
RBC: 3.79 MIL/uL — AB (ref 3.87–5.11)
RDW: 12.8 % (ref 11.5–15.5)
WBC: 7.2 10*3/uL (ref 4.0–10.5)

## 2015-10-30 LAB — I-STAT TROPONIN, ED
TROPONIN I, POC: 0 ng/mL (ref 0.00–0.08)
TROPONIN I, POC: 0.02 ng/mL (ref 0.00–0.08)

## 2015-10-30 LAB — COMPREHENSIVE METABOLIC PANEL
ALK PHOS: 98 U/L (ref 38–126)
ALT: 14 U/L (ref 14–54)
AST: 22 U/L (ref 15–41)
Albumin: 3.2 g/dL — ABNORMAL LOW (ref 3.5–5.0)
Anion gap: 5 (ref 5–15)
BILIRUBIN TOTAL: 0.6 mg/dL (ref 0.3–1.2)
BUN: 18 mg/dL (ref 6–20)
CALCIUM: 8.9 mg/dL (ref 8.9–10.3)
CHLORIDE: 104 mmol/L (ref 101–111)
CO2: 28 mmol/L (ref 22–32)
CREATININE: 1.17 mg/dL — AB (ref 0.44–1.00)
GFR, EST AFRICAN AMERICAN: 52 mL/min — AB (ref >60)
GFR, EST NON AFRICAN AMERICAN: 45 mL/min — AB (ref >60)
Glucose, Bld: 105 mg/dL — ABNORMAL HIGH (ref 65–99)
Potassium: 3.6 mmol/L (ref 3.5–5.1)
Sodium: 137 mmol/L (ref 135–145)
TOTAL PROTEIN: 7.2 g/dL (ref 6.5–8.1)

## 2015-10-30 LAB — D-DIMER, QUANTITATIVE (NOT AT ARMC): D DIMER QUANT: 0.71 ug{FEU}/mL — AB (ref 0.00–0.50)

## 2015-10-30 LAB — TROPONIN I: Troponin I: <0.03 ng/mL (ref <0.031)

## 2015-10-30 MED ORDER — PANTOPRAZOLE SODIUM 20 MG PO TBEC: 20.0000 mg | DELAYED_RELEASE_TABLET | Freq: Two times a day (BID) | ORAL | Status: DC

## 2015-10-30 MED ORDER — GI COCKTAIL ~~LOC~~
30.0000 mL | Freq: Once | ORAL | Status: AC
  Administered 2015-10-30: 30 mL via ORAL
  Filled 2015-10-30: qty 30

## 2015-10-30 MED ORDER — FENTANYL CITRATE (PF) 100 MCG/2ML IJ SOLN
50.0000 ug | Freq: Once | INTRAMUSCULAR | Status: DC
  Filled 2015-10-30: qty 2

## 2015-10-30 MED ORDER — IOHEXOL 350 MG/ML SOLN
100.0000 mL | Freq: Once | INTRAVENOUS | Status: AC | PRN
  Administered 2015-10-30: 100 mL via INTRAVENOUS

## 2015-10-30 MED ORDER — ALUM & MAG HYDROXIDE-SIMETH 200-200-20 MG/5 ML NICU TOPICAL: 1.0000 "application " | TOPICAL | Status: DC | PRN

## 2015-10-30 MED ORDER — ACETAMINOPHEN 325 MG PO TABS
650.0000 mg | ORAL_TABLET | Freq: Once | ORAL | Status: AC
  Administered 2015-10-30: 650 mg via ORAL
  Filled 2015-10-30: qty 2

## 2015-10-30 NOTE — ED Notes (Signed)
Per EMS-staff at [facility] and family report pt c/o cp x 2 days. Pt confused.

## 2015-10-30 NOTE — ED Provider Notes (Signed)
CSN: [id number]     Arrival date & time 10/30/15  1352 History   First MD Initiated Contact with Patient 10/30/15 1408     Chief Complaint  Patient presents with  . Chest Pain     (Consider location/radiation/quality/duration/timing/severity/associated sxs/prior Treatment) Patient is a 73 y.o. female presenting with chest pain.  Chest Pain Pain location:  Substernal area Pain quality: burning and sharp   Pain radiates to:  Does not radiate Pain radiates to the back: no   Pain severity:  Mild Onset quality:  Gradual Timing:  Intermittent Relieved by:  None tried Worsened by:  Nothing tried Ineffective treatments:  None tried Associated symptoms: fatigue   Associated symptoms: no abdominal pain, no dizziness and no fever     Past Medical History  Diagnosis Date  . CVA (cerebral infarction)     speech difficulities  . Stroke ([place])   . Hypercholesteremia     Takes Zocor daily  . Hypertension     takes Atenolol and Lisinopril daily  . Lung mass     left upper lobe  . Arthritis     hands  . TIA (transient ischemic attack)     Sat before thanksgiving;impaired speech  . Pneumothorax   . Lung [place])   . CKD (chronic kidney disease), stage III    Past Surgical History  Procedure Laterality Date  . No past surgeries    . Lung biopsy  10/13/11    left  . Abdominal hysterectomy  1975  . Varicose vein surgery  79yr ago  . Video assisted thoracoscopy  11/18/2011    Procedure: VIDEO ASSISTED THORACOSCOPY;  Surgeon: SMelrose Nakayama MD;  [facility]  Service: Thoracic;  Laterality: Left;  . Lobectomy  11/18/2011    Procedure: LOBECTOMY;  Surgeon: SMelrose Nakayama MD;  [facility]  Service: Thoracic;  Laterality: Left;  LEFT UPPER LOBECTOMY  . Breast biopsy  early 2000   Family History  Problem Relation Age of Onset  . Stroke Mother   . Stroke Sister   . Anesthesia problems Neg Hx   . Hypotension Neg Hx   . Malignant hyperthermia Neg Hx   .  Pseudochol deficiency Neg Hx   . Diabetes Brother   . Cancer Brother   . Stroke Brother    Social History  Substance Use Topics  . Smoking status: Never Smoker   . Smokeless tobacco: Never Used  . Alcohol Use: No   OB History    No data available     Review of Systems  Unable to perform ROS: Patient nonverbal  Constitutional: Positive for fatigue. Negative for fever.  Cardiovascular: Positive for chest pain.  Gastrointestinal: Negative for abdominal pain.  Skin: Negative for rash and wound.  Neurological: Negative for dizziness.  Psychiatric/Behavioral: Positive for behavioral problems.      Allergies  Review of patient's allergies indicates no known allergies.  Home Medications   Prior to Admission medications   Medication Sig Start Date End Date Taking? Authorizing Provider  acetaminophen (TYLENOL) 325 MG tablet Take 650 mg by mouth every 6 (six) hours as needed.   Yes Historical Provider, MD  ALPRAZolam (Duanne Moron 0.25 MG tablet Take one tablet by mouth three times daily as needed for anxiety. Monitor for sedation 10/19/15  Yes Tiffany L Reed, DO  aspirin EC 81 MG tablet Take 81 mg by mouth every morning.   Yes Historical Provider, MD  atenolol (TENORMIN) 25 MG tablet Take 25 mg by  mouth daily.   Yes Historical Provider, MD  Doxepin HCl 3 MG TABS Take 1 tablet (3 mg total) by mouth at bedtime. 02/18/15  Yes Baird Cancer, PA-C  DULoxetine (CYMBALTA) 60 MG capsule Take 60 mg by mouth daily.   Yes Historical Provider, MD  gefitinib (IRESSA) 250 MG tablet Take 1 tablet (250 mg total) by mouth daily. 10/01/15  Yes Thomas S Kefalas, PA-C  guaiFENesin-dextromethorphan (ROBITUSSIN DM) 100-10 MG/5ML syrup Take 5 mLs by mouth every 4 (four) hours as needed for cough.   Yes Historical Provider, MD  metroNIDAZOLE (METROGEL) 0.75 % gel Apply 1 application topically daily.   Yes Historical Provider, MD  senna-docusate (SENOKOT-S) 8.6-50 MG per tablet Take 1 tablet by mouth at bedtime  as needed for mild constipation. 03/17/15  Yes Lezlie Octave Black, NP  simvastatin (ZOCOR) 20 MG tablet Take 1 tablet (20 mg total) by mouth daily. 08/27/14  Yes Kathyrn Drown, MD  traMADol (ULTRAM) 50 MG tablet Take 1 tablet (50 mg total) by mouth every 6 (six) hours as needed. 04/11/15  Yes Fredia Sorrow, MD  alum & mag hydroxide-simeth (MAALOX/MYLANTA) 200-200-20 MG/5 SUSP Apply 1 application topically as needed. 10/30/15   Merrily Pew, MD  pantoprazole (PROTONIX) 20 MG tablet Take 1 tablet (20 mg total) by mouth 2 (two) times daily. 10/30/15   Corene Cornea Rennee Coyne, MD   BP 92/50 mmHg  Pulse 69  Temp(Src) 97.6 F (36.4 C)  Resp 22  Ht '5\' 4"'$  (1.626 m)  Wt 150 lb (68.04 kg)  BMI 25.73 kg/m2  SpO2 98% Physical Exam  Constitutional: She appears well-developed and well-nourished. No distress.  HENT:  Head: Normocephalic and atraumatic.  Eyes: Conjunctivae are normal. Pupils are equal, round, and reactive to light.  Neck: Normal range of motion.  Cardiovascular: Normal rate, regular rhythm and normal heart sounds.   Pulmonary/Chest: Effort normal and breath sounds normal. Tachypnea noted. No respiratory distress. She has no wheezes. She exhibits no tenderness.  Abdominal: Soft. She exhibits no distension. There is no tenderness.  Musculoskeletal: She exhibits no edema or tenderness.  Neurological: She is alert. No cranial nerve deficit.  Skin: No rash noted. She is not diaphoretic. No erythema.  Nursing note and vitals reviewed.   ED Course  Procedures (including critical care time) Labs Review Labs Reviewed  CBC WITH DIFFERENTIAL/PLATELET - Abnormal; Notable for the following:    RBC 3.79 (*)    All other components within normal limits  COMPREHENSIVE METABOLIC PANEL - Abnormal; Notable for the following:    Glucose, Bld 105 (*)    Creatinine, Ser 1.17 (*)    Albumin 3.2 (*)    GFR calc non Af Amer 45 (*)    GFR calc Af Amer 52 (*)    All other components within normal limits  D-DIMER,  QUANTITATIVE (NOT AT [facility]) - Abnormal; Notable for the following:    D-Dimer, Quant 0.71 (*)    All other components within normal limits  TROPONIN I  I-STAT TROPOININ, ED  I-STAT TROPOININ, ED    Imaging Review Ct Angio Chest Pe W/cm &/or Wo Cm  10/30/2015  CLINICAL DATA:  74 year old female with history of chest pain. History of lung cancer status post lobectomy in 2013. EXAM: CT ANGIOGRAPHY CHEST WITH CONTRAST TECHNIQUE: Multidetector CT imaging of the chest was performed using the standard protocol during bolus administration of intravenous contrast. Multiplanar CT image reconstructions and MIPs were obtained to evaluate the vascular anatomy. CONTRAST:  134m OMNIPAQUE IOHEXOL 350  MG/ML SOLN COMPARISON:  Chest CT 09/02/2015. FINDINGS: Comment: Study is severely limited by a large amount of patient respiratory motion. Because of this, accurate assessment for distal segmental and subsegmental sized emboli is not possible. Mediastinum/Lymph Nodes: Despite the limitations of today's examination, there are no central, lobar or proximal segmental sized filling defects within the pulmonary arteries to suggest pulmonary embolism. Distal segmental and subsegmental sized vessels cannot be accurately assessed secondary to extensive patient respiratory motion. Heart size is normal. There is no significant pericardial fluid, thickening or pericardial calcification. There is atherosclerosis of the thoracic aorta, the great vessels of the mediastinum and the coronary arteries, including calcified atherosclerotic plaque in the left main, left anterior descending, left circumflex and right coronary arteries. Severe calcifications of the mitral annulus. Shift of cardiomediastinal structures in the left hemithorax related to prior left upper lobectomy and associated volume loss. Multiple borderline enlarged and minimally enlarged mediastinal and hilar lymph nodes are noted, measuring up to 11 mm in short axis in the  right paratracheal nodal station. Esophagus is unremarkable in appearance. No axillary lymphadenopathy. Lungs/Pleura: Assessment for small pulmonary nodules is not possible secondary to extensive respiratory motion on today's examination. There are several small pulmonary nodules which are generally unchanged compared to prior examinations, however, the largest pulmonary nodule in the inferior aspect of the right upper lobe associated with the minor fissure (image 43 of series 6) has enlarged compared to prior studies and is more solid in appearance, currently measuring 1.0 x 1.3 cm (previously 12 x 9 mm on 09/02/2015, but 6 mm on 02/12/2014). There is again a background of patchy areas of ground-glass attenuation, septal thickening and cylindrical/ varicose bronchiectasis throughout these lungs bilaterally, most evident in the lower lobes (particularly in the left lower lobe). No associated honeycombing confidently identified at this time. No acute consolidative airspace disease. No pleural effusions. Upper Abdomen: Unremarkable. Musculoskeletal/Soft Tissues: There are no aggressive appearing lytic or blastic lesions noted in the visualized portions of the skeleton. Multiple old healed right-sided rib fractures incidentally noted. Review of the MIP images confirms the above findings. IMPRESSION: 1. Limited study demonstrating no central, lobar or proximal segmental sized pulmonary embolism. More distal segmental and subsegmental sized emboli cannot be excluded secondary to extensive patient respiratory motion. 2. Multiple small pulmonary nodules again noted, generally stable compared to prior studies with exception of mild enlargement of a 10 x 13 mm right upper lobe nodule, as discussed above. 3. Findings in the lungs again suggestive of underlying interstitial lung disease. 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  4. Atherosclerosis, including left main and 3 vessel coronary artery disease. Assessment for potential risk factor modification, dietary therapy or pharmacologic therapy may be warranted, if clinically indicated. 5. Status post left upper lobectomy. Electronically Signed   By: Vinnie Langton M.D.   On: 10/30/2015 18:32   Dg Chest Port 1 View  10/30/2015  CLINICAL DATA:  74 year old female with chest pain for 2 days. EXAM: PORTABLE CHEST 1 VIEW COMPARISON:  05/16/2015 and prior exams. FINDINGS: The cardiomediastinal silhouette is unchanged. Diffuse chronic interstitial opacities are again identified, possibly slightly increased from the prior study. There is no evidence pneumothorax or pleural effusion. Left lung volume loss again noted. No acute bony abnormalities identified. IMPRESSION: Interstitial opacities which may be slightly increased the prior study. This may represent acute infection/edema superimposed on chronic fibrosis. Electronically Signed   By: Cleatis Polka.D.  On: 10/30/2015 15:30   I have personally reviewed and evaluated these images and lab results as part of my medical decision-making.   EKG Interpretation   Date/Time:  Friday October 30 2015 13:58:09 EST Ventricular Rate:  60 PR Interval:  183 QRS Duration: 98 QT Interval:  459 QTC Calculation: 459 R Axis:   77 Text Interpretation:  Sinus rhythm Confirmed by Stefanie Hodgens MD, Corene Cornea [phone])  on 10/30/2015 3:34:18 PM      MDM   Final diagnoses:  Chest pain   Unclear history 2/2 dementia, basically  Non verbal from previous strokes. She seems had 3 days worth of chest pain. Possibly left-sided possibly retrosternal. Daughter also states that she is more sleepy and not as energetic as normal self today. Exam is relatively benign as documented above aside from intermittent tachypnea. The patient's chest pain is very 45 minutes after eating. One time it was relieved with Maalox. Her PE study was negative for any acute PE or  other changes. Delta troponins were negative. Symptoms improved in the ED with a GI cocktail. Jacqulyn Cane was ordered was never given secondary to improvement. Patient discharged back to facility for continued management. I started her on Protonix and Maalox when necessary.    Merrily Pew, MD 10/30/15 [phone]

## 2015-10-30 NOTE — ED Notes (Signed)
After numerous attempts I was finally able to get in touch with "Denman George" nurse at [facility]. Report given, states she will "see if I can find someone to get her".

## 2015-10-30 NOTE — ED Notes (Signed)
Pt is unable to express feelings of hurt or pain. However pt does grimace or smile on occasion.

## 2015-10-31 ENCOUNTER — Encounter: Payer: Self-pay | Admitting: Internal Medicine

## 2015-10-31 DIAGNOSIS — R079 Chest pain, unspecified: Secondary | ICD-10-CM | POA: Insufficient documentation

## 2015-10-31 NOTE — Progress Notes (Signed)
Patient ID: Stephanie [name], female   DOB: 01-02-1941, 74 y.o.   MRN: [id number]     Date is 10/30/2015 Facility; [facility] This is an acute visit  Chief complaint acute visit secondary to chest pain  HPI---; this is a patient who is admitted herea year ago. she had beenadmitted to the hospital  with shortness of breath cough, a CT scan of her chests suggested nodules in the right lung, chronic fibrotic change questionable mild CHF.Her major disability is dysarthriaand right arm greater than leg weakness felt to be secondary to a dominant hemisphere stroke some 2 years before she came here.Normally the patient can communicate around most things. Is able to push herself around in the building in a wheelchair.  Most recently she was sent out with left-sided weakness. MRI showed no acute stroke and she appeared to come back to her normal status Apparently she has been complaining of midsternal chest pain this afternoon-apparently she complained of this yesterday as well. Her daughter who is in the room also thinks she is less energetic the last few days    Communication is somewhat limited secondary to her dysarthric speech but she appears to indicate this pain is also in her left arm.  Vital signs and O2 saturations appear to be unremarkable  She does have a history of hypertension as well as left ventricular hypertrophy most probably related to the hypertension  Past Medical History  Diagnosis Date  . CVA (cerebral infarction)     speech difficulities  . Stroke ([place])   . Hypercholesteremia     Takes Zocor daily  . Hypertension     takes Atenolol and Lisinopril daily  . Lung mass     left upper lobe  . Arthritis     hands  . TIA (transient ischemic attack)     Sat before thanksgiving;impaired speech  . Pneumothorax   . Lung cancer ([place])   . CKD (chronic kidney disease), stage III    Past Surgical History  Procedure Laterality Date  . No past surgeries    . Lung biopsy  10/13/11      left  . Abdominal hysterectomy  1975  . Varicose vein surgery  61yr ago  . Video assisted thoracoscopy  11/18/2011    Procedure: VIDEO ASSISTED THORACOSCOPY;  Surgeon: SMelrose Nakayama MD;  [facility]  Service: Thoracic;  Laterality: Left;  . Lobectomy  11/18/2011    Procedure: LOBECTOMY;  Surgeon: SMelrose Nakayama MD;  [facility]  Service: Thoracic;  Laterality: Left;  LEFT UPPER LOBECTOMY  . Breast biopsy  early 2000    Current Outpatient Prescriptions on File Prior to Visit  Medication Sig Dispense Refill  . acetaminophen (TYLENOL) 325 MG tablet Take 650 mg by mouth every 6 (six) hours as needed.    . ALPRAZolam (XANAX) 0.25 MG tablet Take one tablet by mouth three times daily as needed for anxiety. Monitor for sedation 90 tablet 0  . aspirin EC 81 MG tablet Take 81 mg by mouth every morning.    .Marland Kitchenatenolol (TENORMIN) 25 MG tablet Take 25 mg by mouth daily.    . diphenoxylate-atropine ([name]) 2.5-0.025 MG per tablet Take 1 tablet by mouth 4 (four) times daily as needed for diarrhea or loose stools. (Patient not taking: Reported on 08/20/2015) 30 tablet 5  . Doxepin HCl 3 MG TABS Take 1 tablet (3 mg total) by mouth at bedtime. 30 tablet 2  . DULoxetine (CYMBALTA) 60 MG capsule  Take 60 mg by mouth daily.    Marland Kitchen gefitinib (IRESSA) 250 MG tablet Take 1 tablet (250 mg total) by mouth daily. 30 tablet 6  . guaiFENesin-dextromethorphan (ROBITUSSIN DM) 100-10 MG/5ML syrup Take 5 mLs by mouth every 4 (four) hours as needed for cough.    . metroNIDAZOLE (METROGEL) 0.75 % gel Apply 1 application topically daily.    Marland Kitchen senna-docusate (SENOKOT-S) 8.6-50 MG per tablet Take 1 tablet by mouth at bedtime as needed for mild constipation.    . simvastatin (ZOCOR) 20 MG tablet Take 1 tablet (20 mg total) by mouth daily. 30 tablet 5  . traMADol (ULTRAM) 50 MG tablet Take 1 tablet (50 mg total) by mouth every 6 (six) hours as needed. 15 tablet 0     Social is noted the patient is a  chronic patient here. Her speech is normally dysarthric but she can often make her self understood  She is a full code  Family History  Problem Relation Age of Onset  . Stroke Mother   . Stroke Sister   . Anesthesia problems Neg Hx   . Hypotension Neg Hx   . Malignant hyperthermia Neg Hx   . Pseudochol deficiency Neg Hx   . Diabetes Brother   . Cancer Brother   . Stroke Brother     Review of systems; not really possible due to language disturbance however from what family who is in the room can ascertain and nursing as well as me. She is having some chest discomfort she is at times holding her mid chest area also with some left arm pain.  There is some question whether this is associated possibly with meals and eating  Physical examination She is afebrile pulse of 93 respirations 18 blood pressure 142/76 O2 saturation is 93% on room air Gen. Patient is not in any acute distress during up in her wheelchair Her skin is dry and warm HEENT; probable cataractsbilaterally Respiratory poor respiratory effort but I could not appreciate any overt congestion there is no labored breathing Cardiac; grade 3/6 pan systolic ejection murmur on the right sternal border. 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 Right arm weaknessgreater than right leg. This is not new. I. Her speech is dysarthric, halting. She cannot always follow simple commands .  This is not appear to be any different than her baseline neurologic exam Mental status somewhat anxious and depressed-looking  Labs.  10/02/2015.  WBC 4.7 hemoglobin 12.5 platelets 159.  Sodium 137 potassium 4.2 BUN 17 creatinine 1.23.  Albumin 3.2 otherwise liver  function tests within normal limits.   Impression/plan #Chest pain unspecified-this is quite challenging with patient's limited ability to fully communicate her symptoms-however she appears to be complaining of midsternal chest pain radiating to her left arm which would be concerning for a cardiac process.  We will send her to the ER to rule out any acute cardiac event or possibly lung issue-.  Patient continued to complain of the chest pain before EMS arrived per serial exams clinically she appeared to be stable and not deteriorating from her initial exam however.  [id number]   BMP Latest Ref Rng 10/22/2015 10/22/2015 09/16/2015  Glucose 65 - 99 mg/dL 122(H) 127(H) 116(H)  BUN 6 - 20 mg/dL 16 17 21(H)  Creatinine 0.44 - 1.00 mg/dL 1.20(H) 1.23(H) 1.21(H)  Sodium 135 - 145 mmol/L 140 137 139  Potassium 3.5 - 5.1 mmol/L 4.1 4.2 3.9  Chloride 101 - 111 mmol/L 104 105 106  CO2 22 - 32 mmol/L - 27 26  Calcium 8.9 - 10.3 mg/dL - 8.8(L) 9.3    CBC Latest Ref Rng 10/22/2015 10/22/2015 09/16/2015  WBC 4.0 - 10.5 K/uL - 4.7 5.8  Hemoglobin 12.0 - 15.0 g/dL 12.6 12.5 14.2  Hematocrit 36.0 - 46.0 % 37.0 36.9 42.2  Platelets 150 - 400 K/uL - 159 235

## 2015-11-02 NOTE — Assessment & Plan Note (Addendum)
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  She is tolerating Iressa well without any known side effects or toxicities.    No labs today.  She had labs in the ED on 10/30/2015.  I have discussed the patient with her daughter, Stephanie [name].  She does not see any significant change in her mother.  It is thought that her chest pain is from GERD and since starting treatment for this, it has not recurred.   Her CT angio from a malignancy standpoint is stable.  Stephanie [name] is agreeable that if issues persist or recur, then stopping Iressa would be reasonable.  I am not convinced that Iressa is contributing to her recent ED visits, but I cannot rule it out.  Given the patient's co-morbidities and performance status, it would not be unreasonable to stop treatment.  This can be done at any time.  For now, Stephanie [name] is interested in continuing treatment as planned.  We will stop treatment in the future if necessary.  Labs in 8 weeks: CBC diff, CMET.  [facility] be [facility] by [facility] prior to her appointment date.  Return in 8 weeks for follow-up.

## 2015-11-02 NOTE — Progress Notes (Signed)
Stephanie Lange, MD [place] [address]  Adenocarcinoma of left lung, stage 1 [facility])  CURRENT THERAPY: Iressa 250 mg daily beginning on 02/12/2014.  INTERVAL HISTORY: Stephanie [name] 75 y.o. female returns for followup of questionable progressive adenocarcinoma of the lung, not biopsy proven. 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    Adenocarcinoma of left lung, stage 1 ([place])   10/19/2011 Imaging PET/CT showing activity in LUL lesion, no mediastinal metastasis. no evidence of distant mestastasis   10/19/2011 Initial Diagnosis Adenocarcinoma with Exon 19 deletion, ALK-   11/08/2011 Surgery Thoracoscopic left upper lobectomy with mediastinal exploration, T2aN0 adenocarcinoma with bronchoalveolar  characteristics,  4.3cm, exon 19 deletion, ALK negative, Dr. Roxan Hockey   07/31/2013 Imaging CT chest- Stable postoperative and post therapy change in the left hemi- thorax. No evidence of lung cancer recurrence.   02/12/2014 Imaging Ct chest- The previously seen pulmonary nodules are stable. However, there appears to be new ground-glass pulmonary nodules in the right upper lobe. These are small and nonspecific. Cannot exclude metastases.   05/26/2014 Imaging CT chest- Right lung nodules are grossly stable from 02/12/2014 but appear new/enlarged from 07/16/2012, raising concern for bronchogenic carcinoma. Pulmonary parenchymal pattern of fibrosis does not appear progressive from 07/16/2012   06/25/2014 - 11/30/2014 Chemotherapy Tarceva 25 mg daily started by Dr. Barnet Glasgow ([facility])   10/16/2014 Imaging CT chest- Stable nodules in right lung. The largest nodule in right lower lobe measures 1.6 x 1.5 cm stable from prior exam. No definite new pulmonary nodules are noted.   12/02/2014 Treatment Plan Change Inappropriate use of Tarceva without biopsy proven disease recurrence and inappropriately low dose.   01/14/2015  Imaging CT chest, Continued slow growth of multiple ground-glass and sub solid right lung nodules compared with older prior studies. Findings remain suspicious for metastatic disease or development of multicentric adenocarcinoma.   02/13/2015 -  Chemotherapy Iressa 250 mg daily.   09/22/2015 Imaging CT chest- Slight improvement in right lung.  Pulmonary fibrosis. Assessment for progression is difficult due to respiratory motion and expiratory phase imaging.   10/22/2015 Imaging MRI brain- No large vessel territory infarction. No mass lesion, hemorrhage, hydrocephalus or extra-axial collection. No pituitary mass. No inflammatory sinus disease. No skull or skullbase lesion.   10/30/2015 Imaging CT angio chest- Multiple small pulmonary nodules again noted, generally stable compared to prior studies with exception of mild enlargement of a 10 x 13 mm right upper lobe nodule, as discussed above. 3. Findings in the lungs again suggestive of underl     I personally reviewed and went over laboratory results with the patient.  The results are noted within this dictation.  Labs are very stable.  Chart reviewed.  ED visit on 12/30 for chest pain noted.  CT angio of chest is negative for PE.  Interstitial lung disease is noted.  10/22/2015 hospitalization noted when she was found in the nursing center with her face in her food tray and unresponsive.  She is thought to have suffered a TIA/CVA.  Teleneurology deemed her not a candidate for TPA.  During her hospitalization, she was deemed to have dysphagia #3.  I personally reviewed and went over radiographic studies with the patient.  The results are noted within this dictation.  CT angio of chest is reviewed in detail.  From a malignancy standpoint, her imaging test is stable with a small enlargement of one of the pulmonary  nodules.  There is mention of interstitial lung disease.  I contacted Dr. Thornton Papas, radiology, and we reviewed her scans to evaluate stability of  interstitial lung disease.  Dr. Thornton Papas reports that it is stable and pre-dates Iressa start date.  He notes that compared to her previous scans, it is more difficult to assess, but no frank worsening of the interstitial lung disease.  Stephanie [name] is accompanied by a nurse aid from [facility].    Stephanie [name] denies any chest pain, abdominal, pain, her appetite is good.  She denies any dyspnea or SOB.  She denies any hemoptysis.  She denies a cough.  She denies any urinary or bowel complaints as well.   I have called the patient's daughter, Stephanie [name] and reviewed the patient's case with her.  She was unaware of today's appointment.  Stephanie [name] states that she thinks her mother is stable.  She is under treatment for GERD, as this is thought to be the cause of her chest pain.  She notes that her mother has not reported chest pain since.  She sees her mother regularly and she believes that her mother is stable and not worse.  She is very realistic with her mothers disease and treatment.  She is informed that I am not able to determine whether Iressa has contributed to her recent hospitalization/ED visits.  It would be reasonable to stop Iressa.  Stephanie [name] wishes to continue Iressa, in light of no intolerability or lab abnormalities.  If Stephanie [name] continues to have ED visits, then Stephanie [name] agrees that it would be time to stop Iressa treatment.  She is realistic.   Past Medical History  Diagnosis Date  . CVA (cerebral infarction)     speech difficulities  . Stroke ([place])   . Hypercholesteremia     Takes Zocor daily  . Hypertension     takes Atenolol and Lisinopril daily  . Lung mass     left upper lobe  . Arthritis     hands  . TIA (transient ischemic attack)     Sat before thanksgiving;impaired speech  . Pneumothorax   . Lung cancer ([place])   . CKD (chronic kidney disease), stage III     has CVA (cerebral infarction); HTN (hypertension); Pneumothorax of left lung after biopsy; Adenocarcinoma of left lung, stage 1 ([place]); Dysarthria  as late effect of stroke; Bilateral carotid artery disease ([place]); Other and unspecified hyperlipidemia; Lack of coordination; Muscle weakness (generalized); Hyperlipidemia; Bronchitis; Diastolic CHF, acute ([place]); Dyspnea; Acute on chronic renal failure ([place]); Cough; Anemia; Mental status change; Hypotension; Acute encephalopathy; Encephalopathy acute; Altered mental status; Arterial hypotension; Back pain; UTI (urinary tract infection); History of CVA (cerebrovascular accident); Low back pain; Cellulitis; Rash and nonspecific skin eruption; TIA (transient ischemic attack); Dysphagia; and Chest pain on her problem list.     has No Known Allergies.  Ms. Capurro does not currently have medications on file.  Past Surgical History  Procedure Laterality Date  . No past surgeries    . Lung biopsy  10/13/11    left  . Abdominal hysterectomy  1975  . Varicose vein surgery  8yr ago  . Video assisted thoracoscopy  11/18/2011    Procedure: VIDEO ASSISTED THORACOSCOPY;  Surgeon: SMelrose Nakayama MD;  [place] [facility]  Service: Thoracic;  Laterality: Left;  . Lobectomy  11/18/2011    Procedure: LOBECTOMY;  Surgeon: SMelrose Nakayama MD;  [place] [facility]  Service: Thoracic;  Laterality: Left;  LEFT UPPER LOBECTOMY  . Breast biopsy  early 2000    Denies any headaches, dizziness, double vision, fevers, chills, night sweats, nausea, vomiting, diarrhea, constipation, chest pain, heart palpitations, shortness of breath, blood in stool, black tarry stool, urinary pain, urinary burning, urinary frequency, hematuria.   PHYSICAL EXAMINATION  ECOG PERFORMANCE STATUS: 1 - Symptomatic but completely ambulatory  Filed Vitals:   11/04/15 1435  BP: 123/52  Pulse: 62  Temp: 97.3 F (36.3 C)  Resp: 16    GENERAL:alert, no distress, well nourished, well developed, comfortable, cooperative, smiling and accompanied by her daughter and son. SKIN: skin color, texture, turgor are normal, no rashes or  significant lesions HEAD: Normocephalic, No masses, lesions, tenderness or abnormalities EYES: normal, PERRLA, EOMI, Conjunctiva are pink and non-injected EARS: External ears normal OROPHARYNX:lips, buccal mucosa, and tongue normal and mucous membranes are moist  NECK: supple, no adenopathy, thyroid normal size, non-tender, without nodularity, no stridor, non-tender, trachea midline LYMPH:  no palpable lymphadenopathy BREAST:not examined LUNGS: clear to auscultation and percussion HEART: regular rate & rhythm, no murmurs, no gallops, S1 normal and S2 normal ABDOMEN:abdomen soft, non-tender and normal bowel sounds BACK: Back symmetric, no curvature. EXTREMITIES:less then 2 second capillary refill, no joint deformities, effusion.   NEURO: alert & oriented x 3 with slow speech, in a wheelchair, unilateral weakness secondary to stroke.    LABORATORY DATA: CBC    Component Value Date/Time   WBC 6.1 11/03/2015 1113   RBC 3.83* 11/03/2015 1113   HGB 12.6 11/03/2015 1113   HCT 38.2 11/03/2015 1113   PLT 205 11/03/2015 1113   MCV 99.7 11/03/2015 1113   MCH 32.9 11/03/2015 1113   MCHC 33.0 11/03/2015 1113   RDW 12.7 11/03/2015 1113   LYMPHSABS 1.0 11/03/2015 1113   MONOABS 0.4 11/03/2015 1113   EOSABS 0.3 11/03/2015 1113   BASOSABS 0.0 11/03/2015 1113      Chemistry      Component Value Date/Time   NA 141 11/03/2015 1113   K 4.0 11/03/2015 1113   CL 107 11/03/2015 1113   CO2 27 11/03/2015 1113   BUN 17 11/03/2015 1113   CREATININE 1.35* 11/03/2015 1113      Component Value Date/Time   CALCIUM 9.1 11/03/2015 1113   ALKPHOS 93 11/03/2015 1113   AST 21 11/03/2015 1113   ALT 12* 11/03/2015 1113   BILITOT 0.6 11/03/2015 1113        ASSESSMENT AND PLAN:  Adenocarcinoma of left lung, stage 1 (H[facility]) Questionable progressive adenocarcinoma of the lung, not biopsy proven. Initial surgical resection 11/18/2011 T2a, pN0 Adenocarcinoma LUL, EGFR mutated (deletion in exon  19).  She is tolerating Iressa well without any known side effects or toxicities.    No labs today.  She had labs in the ED on 10/30/2015.  I have discussed the patient with her daughter, Stephanie [name].  She does not see any significant change in her mother.  It is thought that her chest pain is from GERD and since starting treatment for this, it has not recurred.   Her CT angio from a malignancy standpoint is stable.  Stephanie [name] is agreeable that if issues persist or recur, then stopping Iressa would be reasonable.  I am not convinced that Iressa is contributing to her recent ED visits, but I cannot rule it out.  Given the patient's co-morbidities and performance status, it would not be unreasonable to stop treatment.  This can be done at any time.  For now, Stephanie [name] is interested in continuing treatment as planned.  We will stop  treatment in the future if necessary.  Labs in 8 weeks: CBC diff, CMET.  This can be done by [facility] prior to her appointment date.  Return in 8 weeks for follow-up.   THERAPY PLAN:  Continue Iressa daily as prescribed (250 mg).  We will monitor for toxicities.  All questions were answered. The patient knows to call the clinic with any problems, questions or concerns. We can certainly see the patient much sooner if necessary.  Patient and plan discussed with Dr. Ancil Linsey and she is in agreement with the aforementioned.   This note is electronically signed by: Robynn Pane 11/04/2015 3:36 PM

## 2015-11-03 ENCOUNTER — Other Ambulatory Visit (HOSPITAL_COMMUNITY): Payer: Medicare Other

## 2015-11-03 ENCOUNTER — Encounter (HOSPITAL_COMMUNITY): Payer: Medicare Other | Attending: Internal Medicine

## 2015-11-03 DIAGNOSIS — A4902 Methicillin resistant Staphylococcus aureus infection, unspecified site: Secondary | ICD-10-CM | POA: Insufficient documentation

## 2015-11-03 DIAGNOSIS — C3432 Malignant neoplasm of lower lobe, left bronchus or lung: Secondary | ICD-10-CM | POA: Insufficient documentation

## 2015-11-03 DIAGNOSIS — M869 Osteomyelitis, unspecified: Secondary | ICD-10-CM | POA: Insufficient documentation

## 2015-11-03 DIAGNOSIS — C3492 Malignant neoplasm of unspecified part of left bronchus or lung: Secondary | ICD-10-CM

## 2015-11-03 LAB — COMPREHENSIVE METABOLIC PANEL WITH GFR
ALT: 12 U/L — ABNORMAL LOW (ref 14–54)
AST: 21 U/L (ref 15–41)
Albumin: 3.2 g/dL — ABNORMAL LOW (ref 3.5–5.0)
Alkaline Phosphatase: 93 U/L (ref 38–126)
Anion gap: 7 (ref 5–15)
BUN: 17 mg/dL (ref 6–20)
CO2: 27 mmol/L (ref 22–32)
Calcium: 9.1 mg/dL (ref 8.9–10.3)
Chloride: 107 mmol/L (ref 101–111)
Creatinine, Ser: 1.35 mg/dL — ABNORMAL HIGH (ref 0.44–1.00)
GFR calc Af Amer: 44 mL/min — ABNORMAL LOW (ref >60)
GFR calc non Af Amer: 38 mL/min — ABNORMAL LOW (ref >60)
Glucose, Bld: 101 mg/dL — ABNORMAL HIGH (ref 65–99)
Potassium: 4 mmol/L (ref 3.5–5.1)
Sodium: 141 mmol/L (ref 135–145)
Total Bilirubin: 0.6 mg/dL (ref 0.3–1.2)
Total Protein: 7.1 g/dL (ref 6.5–8.1)

## 2015-11-03 LAB — CBC WITH DIFFERENTIAL/PLATELET
Basophils Absolute: 0 K/uL (ref 0.0–0.1)
Basophils Relative: 0 %
Eosinophils Absolute: 0.3 K/uL (ref 0.0–0.7)
Eosinophils Relative: 5 %
HCT: 38.2 % (ref 36.0–46.0)
Hemoglobin: 12.6 g/dL (ref 12.0–15.0)
Lymphocytes Relative: 17 %
Lymphs Abs: 1 K/uL (ref 0.7–4.0)
MCH: 32.9 pg (ref 26.0–34.0)
MCHC: 33 g/dL (ref 30.0–36.0)
MCV: 99.7 fL (ref 78.0–100.0)
Monocytes Absolute: 0.4 K/uL (ref 0.1–1.0)
Monocytes Relative: 7 %
Neutro Abs: 4.3 K/uL (ref 1.7–7.7)
Neutrophils Relative %: 71 %
Platelets: 205 K/uL (ref 150–400)
RBC: 3.83 MIL/uL — ABNORMAL LOW (ref 3.87–5.11)
RDW: 12.7 % (ref 11.5–15.5)
WBC: 6.1 K/uL (ref 4.0–10.5)

## 2015-11-04 ENCOUNTER — Other Ambulatory Visit: Payer: Self-pay

## 2015-11-04 ENCOUNTER — Other Ambulatory Visit (HOSPITAL_COMMUNITY): Payer: Medicare Other

## 2015-11-04 ENCOUNTER — Encounter (HOSPITAL_COMMUNITY): Payer: Self-pay | Admitting: Oncology

## 2015-11-04 ENCOUNTER — Encounter (HOSPITAL_BASED_OUTPATIENT_CLINIC_OR_DEPARTMENT_OTHER): Payer: Medicare Other | Admitting: Oncology

## 2015-11-04 ENCOUNTER — Ambulatory Visit (HOSPITAL_COMMUNITY): Payer: Medicare Other | Admitting: Oncology

## 2015-11-04 ENCOUNTER — Inpatient Hospital Stay (HOSPITAL_COMMUNITY)
Admission: EM | Admit: 2015-11-04 | Discharge: 2015-11-17 | DRG: 956 | Disposition: A | Discharge status: Skilled Nursing Facility | Payer: Medicare Other | Attending: Internal Medicine | Admitting: Internal Medicine

## 2015-11-04 ENCOUNTER — Ambulatory Visit (HOSPITAL_COMMUNITY)
Admission: RE | Admit: 2015-11-04 | Discharge: 2015-11-04 | Disposition: A | Discharge status: Home / Self Care | Payer: Medicare Other | Source: Ambulatory Visit | Attending: Internal Medicine | Admitting: Internal Medicine

## 2015-11-04 ENCOUNTER — Other Ambulatory Visit: Payer: Self-pay | Admitting: Internal Medicine

## 2015-11-04 ENCOUNTER — Other Ambulatory Visit (HOSPITAL_COMMUNITY): Payer: Self-pay

## 2015-11-04 ENCOUNTER — Encounter (HOSPITAL_COMMUNITY): Payer: Self-pay | Admitting: Emergency Medicine

## 2015-11-04 ENCOUNTER — Inpatient Hospital Stay (HOSPITAL_COMMUNITY): Payer: Medicare Other

## 2015-11-04 VITALS — BP 123/52 | HR 62 | Temp 97.3°F | Resp 16 | Wt 144.4 lb

## 2015-11-04 DIAGNOSIS — S3210XA Unspecified fracture of sacrum, initial encounter for closed fracture: Secondary | ICD-10-CM | POA: Diagnosis present

## 2015-11-04 DIAGNOSIS — I959 Hypotension, unspecified: Secondary | ICD-10-CM | POA: Diagnosis present

## 2015-11-04 DIAGNOSIS — E440 Moderate protein-calorie malnutrition: Secondary | ICD-10-CM | POA: Diagnosis present

## 2015-11-04 DIAGNOSIS — R471 Dysarthria and anarthria: Secondary | ICD-10-CM | POA: Diagnosis present

## 2015-11-04 DIAGNOSIS — T148XXA Other injury of unspecified body region, initial encounter: Secondary | ICD-10-CM

## 2015-11-04 DIAGNOSIS — R918 Other nonspecific abnormal finding of lung field: Secondary | ICD-10-CM | POA: Diagnosis present

## 2015-11-04 DIAGNOSIS — S72141A Displaced intertrochanteric fracture of right femur, initial encounter for closed fracture: Principal | ICD-10-CM | POA: Diagnosis present

## 2015-11-04 DIAGNOSIS — Z8673 Personal history of transient ischemic attack (TIA), and cerebral infarction without residual deficits: Secondary | ICD-10-CM | POA: Diagnosis not present

## 2015-11-04 DIAGNOSIS — I422 Other hypertrophic cardiomyopathy: Secondary | ICD-10-CM | POA: Diagnosis present

## 2015-11-04 DIAGNOSIS — I69328 Other speech and language deficits following cerebral infarction: Secondary | ICD-10-CM

## 2015-11-04 DIAGNOSIS — E785 Hyperlipidemia, unspecified: Secondary | ICD-10-CM | POA: Diagnosis present

## 2015-11-04 DIAGNOSIS — Z7902 Long term (current) use of antithrombotics/antiplatelets: Secondary | ICD-10-CM | POA: Diagnosis not present

## 2015-11-04 DIAGNOSIS — N179 Acute kidney failure, unspecified: Secondary | ICD-10-CM | POA: Diagnosis present

## 2015-11-04 DIAGNOSIS — W19XXXA Unspecified fall, initial encounter: Secondary | ICD-10-CM

## 2015-11-04 DIAGNOSIS — N183 Chronic kidney disease, stage 3 unspecified: Secondary | ICD-10-CM | POA: Diagnosis present

## 2015-11-04 DIAGNOSIS — Z66 Do not resuscitate: Secondary | ICD-10-CM | POA: Diagnosis present

## 2015-11-04 DIAGNOSIS — W050XXA Fall from non-moving wheelchair, initial encounter: Secondary | ICD-10-CM | POA: Diagnosis present

## 2015-11-04 DIAGNOSIS — I081 Rheumatic disorders of both mitral and tricuspid valves: Secondary | ICD-10-CM | POA: Diagnosis present

## 2015-11-04 DIAGNOSIS — I4891 Unspecified atrial fibrillation: Secondary | ICD-10-CM | POA: Diagnosis not present

## 2015-11-04 DIAGNOSIS — I5022 Chronic systolic (congestive) heart failure: Secondary | ICD-10-CM | POA: Diagnosis present

## 2015-11-04 DIAGNOSIS — I421 Obstructive hypertrophic cardiomyopathy: Secondary | ICD-10-CM | POA: Diagnosis present

## 2015-11-04 DIAGNOSIS — C3492 Malignant neoplasm of unspecified part of left bronchus or lung: Secondary | ICD-10-CM | POA: Diagnosis present

## 2015-11-04 DIAGNOSIS — Z6823 Body mass index (BMI) 23.0-23.9, adult: Secondary | ICD-10-CM | POA: Diagnosis not present

## 2015-11-04 DIAGNOSIS — Z0189 Encounter for other specified special examinations: Secondary | ICD-10-CM

## 2015-11-04 DIAGNOSIS — D62 Acute posthemorrhagic anemia: Secondary | ICD-10-CM | POA: Diagnosis not present

## 2015-11-04 DIAGNOSIS — I130 Hypertensive heart and chronic kidney disease with heart failure and stage 1 through stage 4 chronic kidney disease, or unspecified chronic kidney disease: Secondary | ICD-10-CM | POA: Diagnosis present

## 2015-11-04 DIAGNOSIS — Z85118 Personal history of other malignant neoplasm of bronchus and lung: Secondary | ICD-10-CM

## 2015-11-04 DIAGNOSIS — M1990 Unspecified osteoarthritis, unspecified site: Secondary | ICD-10-CM | POA: Diagnosis present

## 2015-11-04 DIAGNOSIS — I4589 Other specified conduction disorders: Secondary | ICD-10-CM | POA: Diagnosis present

## 2015-11-04 DIAGNOSIS — I69320 Aphasia following cerebral infarction: Secondary | ICD-10-CM

## 2015-11-04 DIAGNOSIS — R1310 Dysphagia, unspecified: Secondary | ICD-10-CM | POA: Diagnosis not present

## 2015-11-04 DIAGNOSIS — Z515 Encounter for palliative care: Secondary | ICD-10-CM | POA: Diagnosis not present

## 2015-11-04 DIAGNOSIS — Z09 Encounter for follow-up examination after completed treatment for conditions other than malignant neoplasm: Secondary | ICD-10-CM

## 2015-11-04 DIAGNOSIS — I739 Peripheral vascular disease, unspecified: Secondary | ICD-10-CM

## 2015-11-04 DIAGNOSIS — S72001A Fracture of unspecified part of neck of right femur, initial encounter for closed fracture: Secondary | ICD-10-CM | POA: Diagnosis present

## 2015-11-04 DIAGNOSIS — K219 Gastro-esophageal reflux disease without esophagitis: Secondary | ICD-10-CM | POA: Diagnosis not present

## 2015-11-04 DIAGNOSIS — R911 Solitary pulmonary nodule: Secondary | ICD-10-CM | POA: Diagnosis not present

## 2015-11-04 DIAGNOSIS — Y92129 Unspecified place in nursing home as the place of occurrence of the external cause: Secondary | ICD-10-CM | POA: Diagnosis not present

## 2015-11-04 DIAGNOSIS — Z4659 Encounter for fitting and adjustment of other gastrointestinal appliance and device: Secondary | ICD-10-CM

## 2015-11-04 DIAGNOSIS — I480 Paroxysmal atrial fibrillation: Secondary | ICD-10-CM | POA: Diagnosis present

## 2015-11-04 DIAGNOSIS — Z7982 Long term (current) use of aspirin: Secondary | ICD-10-CM

## 2015-11-04 DIAGNOSIS — I779 Disorder of arteries and arterioles, unspecified: Secondary | ICD-10-CM | POA: Diagnosis not present

## 2015-11-04 DIAGNOSIS — S32599A Other specified fracture of unspecified pubis, initial encounter for closed fracture: Secondary | ICD-10-CM | POA: Diagnosis present

## 2015-11-04 DIAGNOSIS — S32511A Fracture of superior rim of right pubis, initial encounter for closed fracture: Secondary | ICD-10-CM | POA: Diagnosis present

## 2015-11-04 DIAGNOSIS — Z01810 Encounter for preprocedural cardiovascular examination: Secondary | ICD-10-CM

## 2015-11-04 DIAGNOSIS — I639 Cerebral infarction, unspecified: Secondary | ICD-10-CM

## 2015-11-04 DIAGNOSIS — I4892 Unspecified atrial flutter: Secondary | ICD-10-CM | POA: Diagnosis present

## 2015-11-04 DIAGNOSIS — E7800 Pure hypercholesterolemia, unspecified: Secondary | ICD-10-CM | POA: Diagnosis present

## 2015-11-04 LAB — COMPREHENSIVE METABOLIC PANEL
ALK PHOS: 96 U/L (ref 38–126)
ALT: 19 U/L (ref 14–54)
ANION GAP: 9 (ref 5–15)
AST: 27 U/L (ref 15–41)
Albumin: 3.5 g/dL (ref 3.5–5.0)
BILIRUBIN TOTAL: 0.8 mg/dL (ref 0.3–1.2)
BUN: 19 mg/dL (ref 6–20)
CALCIUM: 9 mg/dL (ref 8.9–10.3)
CO2: 24 mmol/L (ref 22–32)
Chloride: 106 mmol/L (ref 101–111)
Creatinine, Ser: 1.38 mg/dL — ABNORMAL HIGH (ref 0.44–1.00)
GFR calc non Af Amer: 37 mL/min — ABNORMAL LOW (ref >60)
GFR, EST AFRICAN AMERICAN: 43 mL/min — AB (ref >60)
Glucose, Bld: 182 mg/dL — ABNORMAL HIGH (ref 65–99)
Potassium: 4.4 mmol/L (ref 3.5–5.1)
SODIUM: 139 mmol/L (ref 135–145)
TOTAL PROTEIN: 7.5 g/dL (ref 6.5–8.1)

## 2015-11-04 LAB — CBC WITH DIFFERENTIAL/PLATELET
Basophils Absolute: 0 10*3/uL (ref 0.0–0.1)
Basophils Relative: 0 %
EOS ABS: 0.2 10*3/uL (ref 0.0–0.7)
Eosinophils Relative: 1 %
HCT: 35.9 % — ABNORMAL LOW (ref 36.0–46.0)
HEMOGLOBIN: 12.1 g/dL (ref 12.0–15.0)
LYMPHS ABS: 1 10*3/uL (ref 0.7–4.0)
Lymphocytes Relative: 7 %
MCH: 32.9 pg (ref 26.0–34.0)
MCHC: 33.7 g/dL (ref 30.0–36.0)
MCV: 97.6 fL (ref 78.0–100.0)
MONO ABS: 0.7 10*3/uL (ref 0.1–1.0)
MONOS PCT: 5 %
NEUTROS PCT: 87 %
Neutro Abs: 12.3 10*3/uL — ABNORMAL HIGH (ref 1.7–7.7)
Platelets: 224 10*3/uL (ref 150–400)
RBC: 3.68 MIL/uL — ABNORMAL LOW (ref 3.87–5.11)
RDW: 12.4 % (ref 11.5–15.5)
WBC: 14.2 10*3/uL — ABNORMAL HIGH (ref 4.0–10.5)

## 2015-11-04 LAB — PROTIME-INR
INR: 1.22 (ref 0.00–1.49)
PROTHROMBIN TIME: 15.6 s — AB (ref 11.6–15.2)

## 2015-11-04 LAB — PREPARE RBC (CROSSMATCH)

## 2015-11-04 LAB — ABO/RH: ABO/RH(D): O POS

## 2015-11-04 MED ORDER — HYDROMORPHONE HCL 1 MG/ML IJ SOLN
1.0000 mg | Freq: Once | INTRAMUSCULAR | Status: AC
  Administered 2015-11-04: 0.5 mg via INTRAVENOUS
  Filled 2015-11-04: qty 1

## 2015-11-04 MED ORDER — LORAZEPAM 1 MG PO TABS
1.0000 mg | ORAL_TABLET | Freq: Three times a day (TID) | ORAL | Status: DC | PRN
  Administered 2015-11-04 – 2015-11-06 (×2): 1 mg via ORAL
  Filled 2015-11-04 (×2): qty 1

## 2015-11-04 MED ORDER — SODIUM CHLORIDE 0.9 % IV SOLN: Freq: Once | INTRAVENOUS | Status: DC

## 2015-11-04 MED ORDER — SODIUM CHLORIDE 0.9 % IV SOLN
INTRAVENOUS | Status: DC
  Administered 2015-11-04: 20:00:00 via INTRAVENOUS

## 2015-11-04 MED ORDER — DILTIAZEM HCL 100 MG IV SOLR
INTRAVENOUS | Status: AC
  Administered 2015-11-04: 22:00:00 via INTRAVENOUS
  Filled 2015-11-04: qty 100

## 2015-11-04 MED ORDER — ONDANSETRON HCL 4 MG/2ML IJ SOLN
4.0000 mg | Freq: Once | INTRAMUSCULAR | Status: AC
  Administered 2015-11-04: 4 mg via INTRAVENOUS
  Filled 2015-11-04: qty 2

## 2015-11-04 NOTE — ED Notes (Signed)
Pt fell and broke her right hip. Pt was sent over by xray. [facility].

## 2015-11-04 NOTE — ED Provider Notes (Signed)
CSN: [id number]     Arrival date & time 11/04/15  1918 History   First MD Initiated Contact with Patient 11/04/15 1922     Chief Complaint  Patient presents with  . Hip Pain     (Consider location/radiation/quality/duration/timing/severity/associated sxs/prior Treatment) Patient is a 75 y.o. female presenting with hip pain. The history is provided by a relative (The patient was seated in her wheelchair and she was reaching over to get a fork and fell to the floor).  Hip Pain This is a new problem. The current episode started 1 to 2 hours ago. The problem occurs constantly. The problem has not changed since onset.Pertinent negatives include no chest pain and no abdominal pain. Exacerbated by: Movement. Nothing relieves the symptoms.    Past Medical History  Diagnosis Date  . CVA (cerebral infarction)     speech difficulities  . Stroke ([place])   . Hypercholesteremia     Takes Zocor daily  . Hypertension     takes Atenolol and Lisinopril daily  . Lung mass     left upper lobe  . Arthritis     hands  . TIA (transient ischemic attack)     Sat before thanksgiving;impaired speech  . Pneumothorax   . Lung cancer ([place])   . CKD (chronic kidney disease), stage III    Past Surgical History  Procedure Laterality Date  . No past surgeries    . Lung biopsy  10/13/11    left  . Abdominal hysterectomy  1975  . Varicose vein surgery  36yr ago  . Video assisted thoracoscopy  11/18/2011    Procedure: VIDEO ASSISTED THORACOSCOPY;  Surgeon: SMelrose Nakayama MD;  [place] [facility]  Service: Thoracic;  Laterality: Left;  . Lobectomy  11/18/2011    Procedure: LOBECTOMY;  Surgeon: SMelrose Nakayama MD;  [place] [facility]  Service: Thoracic;  Laterality: Left;  LEFT UPPER LOBECTOMY  . Breast biopsy  early 2000   Family History  Problem Relation Age of Onset  . Stroke Mother   . Stroke Sister   . Anesthesia problems Neg Hx   . Hypotension Neg Hx   . Malignant hyperthermia Neg Hx   .  Pseudochol deficiency Neg Hx   . Diabetes Brother   . Cancer Brother   . Stroke Brother    Social History  Substance Use Topics  . Smoking status: Never Smoker   . Smokeless tobacco: Never Used  . Alcohol Use: No   OB History    No data available     Review of Systems  Unable to perform ROS: Dementia  Cardiovascular: Negative for chest pain.  Gastrointestinal: Negative for abdominal pain.      Allergies  Review of patient's allergies indicates no known allergies.  Home Medications   Prior to Admission medications   Medication Sig Start Date End Date Taking? Authorizing Provider  acetaminophen (TYLENOL) 325 MG tablet Take 650 mg by mouth every 6 (six) hours as needed.    Historical Provider, MD  ALPRAZolam (Duanne Moron 0.25 MG tablet Take one tablet by mouth three times daily as needed for anxiety. Monitor for sedation 10/19/15   Tiffany L Reed, DO  alum & mag hydroxide-simeth (MAALOX/MYLANTA) 200-200-20 MG/5 SUSP Apply 1 application topically as needed. 10/30/15   JMerrily Pew MD  aspirin EC 81 MG tablet Take 81 mg by mouth every morning. Reported on 11/04/2015    Historical Provider, MD  atenolol (TENORMIN) 25 MG tablet Take 25 mg by mouth daily.  Historical Provider, MD  clopidogrel (PLAVIX) 75 MG tablet Take 75 mg by mouth daily.    Historical Provider, MD  diphenoxylate-atropine ([name]) 2.5-0.025 MG tablet Take by mouth 4 (four) times daily as needed for diarrhea or loose stools.    Historical Provider, MD  Doxepin HCl 3 MG TABS Take 1 tablet (3 mg total) by mouth at bedtime. 02/18/15   Baird Cancer, PA-C  DULoxetine (CYMBALTA) 60 MG capsule Take 60 mg by mouth daily.    Historical Provider, MD  gefitinib (IRESSA) 250 MG tablet Take 1 tablet (250 mg total) by mouth daily. 10/01/15   Baird Cancer, PA-C  guaiFENesin-dextromethorphan (ROBITUSSIN DM) 100-10 MG/5ML syrup Take 5 mLs by mouth every 4 (four) hours as needed for cough.    Historical Provider, MD  lamoTRIgine  (LAMICTAL) 25 MG tablet Take 25 mg by mouth daily.    Historical Provider, MD  metroNIDAZOLE (METROGEL) 0.75 % gel Apply 1 application topically daily.    Historical Provider, MD  pantoprazole (PROTONIX) 20 MG tablet Take 1 tablet (20 mg total) by mouth 2 (two) times daily. 10/30/15   Merrily Pew, MD  senna-docusate (SENOKOT-S) 8.6-50 MG per tablet Take 1 tablet by mouth at bedtime as needed for mild constipation. 03/17/15   Radene Gunning, NP  simvastatin (ZOCOR) 20 MG tablet Take 1 tablet (20 mg total) by mouth daily. 08/27/14   Kathyrn Drown, MD  traMADol (ULTRAM) 50 MG tablet Take 1 tablet (50 mg total) by mouth every 6 (six) hours as needed. Patient not taking: Reported on 11/04/2015 04/11/15   Fredia Sorrow, MD   BP 138/82 mmHg  Pulse 96  Temp(Src) 97.6 F (36.4 C)  Resp 27  Wt 140 lb (63.504 kg)  SpO2 92% Physical Exam  Constitutional: She appears well-developed.  HENT:  Head: Normocephalic and atraumatic.  Eyes: Conjunctivae and EOM are normal. No scleral icterus.  Neck: Neck supple. No thyromegaly present.  Cardiovascular: Normal rate and regular rhythm.  Exam reveals no gallop and no friction rub.   No murmur heard. Pulmonary/Chest: No stridor. She has no wheezes. She has no rales. She exhibits no tenderness.  Abdominal: She exhibits no distension. There is no tenderness. There is no rebound.  Musculoskeletal: Normal range of motion. She exhibits no edema.  Tender and deformed right hip  Lymphadenopathy:    She has no cervical adenopathy.  Neurological: She is alert. She exhibits normal muscle tone. Coordination normal.  Patient is awake but not oriented to anything  Skin: No rash noted. No erythema.  Psychiatric: She has a normal mood and affect. Her behavior is normal.    ED Course  Procedures (including critical care time) Labs Review Labs Reviewed  CBC WITH DIFFERENTIAL/PLATELET - Abnormal; Notable for the following:    WBC 14.2 (*)    RBC 3.68 (*)    HCT 35.9  (*)    Neutro Abs 12.3 (*)    All other components within normal limits  COMPREHENSIVE METABOLIC PANEL - Abnormal; Notable for the following:    Glucose, Bld 182 (*)    Creatinine, Ser 1.38 (*)    GFR calc non Af Amer 37 (*)    GFR calc Af Amer 43 (*)    All other components within normal limits  TYPE AND SCREEN    Imaging Review Dg Knee Complete 4 Views Right  11/04/2015  CLINICAL DATA:  Fall from wheelchair at nursing home prior to arrival, now with right lower extremity pain. Right hip  pain radiating to the knee. Initial encounter. EXAM: RIGHT KNEE - COMPLETE 4+ VIEW COMPARISON:  None. FINDINGS: Note fracture or dislocation. 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 Detailed evaluation partially obscured related to positioning. No large joint effusion. Vascular calcifications are seen. IMPRESSION: 1. No acute fracture or dislocation. 2. 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 Electronically Signed   By: Jeb Levering M.D.   On: 11/04/2015 19:19   Dg Hip Unilat With Pelvis 2-3 Views Right  11/04/2015  CLINICAL DATA:  Pt fell from wheelchair at nursing home prior to arrival to Radiology. Severe pain in Rt hip with radiation into Rt knee. EXAM: DG HIP (WITH OR WITHOUT PELVIS) 2-3V RIGHT COMPARISON:  None. FINDINGS: Intertrochanteric fracture of the proximal RIGHT femur with varus angulation. There is superior migration of the distal fracture fragment. No dislocation. IMPRESSION: RIGHT intertrochanteric femur fracture. Electronically Signed   By: Suzy Bouchard M.D.   On: 11/04/2015 19:20   I have personally reviewed and evaluated these images and lab results as part of my medical decision-making.   EKG Interpretation None      MDM   Final diagnoses:  Closed right hip fracture, initial encounter [facility]    Intratrochanteric fracture of the right hip. Patient will be admitted to medicine and Dr. Aline Brochure  orthopedic surgeon will repair her hip    Milton Ferguson, MD 11/04/15 2127

## 2015-11-04 NOTE — ED Notes (Signed)
HR got down to 96 at one time but back up to 135 at this time.

## 2015-11-04 NOTE — ED Notes (Signed)
Daughter in room and updated. Pt still hollering out at times and still moving around in bed.

## 2015-11-04 NOTE — Consult Note (Addendum)
Reason for Consult: Mechanical fall right hip fracture Referring Physician: Dr. Rozetta Nunnery is an 75 y.o. female.  HPI: 75 year old female a phasic so had to take the history from the ER record and the history and physical dictated by Dr. Truman Hayward. Basically she had a stroke workup in December 2016 unclear if she actually had a stroke or TIA. She was successfully treated with back to the nursing home where she is a household ambulator bed to chair stand with assist ambulator.  Review of systems deferred as well because of aphasia  Past Medical History  Diagnosis Date  . CVA (cerebral infarction)     speech difficulities  . Stroke ([place])   . Hypercholesteremia     Takes Zocor daily  . Hypertension     takes Atenolol and Lisinopril daily  . Lung mass     left upper lobe  . Arthritis     hands  . TIA (transient ischemic attack)     Sat before thanksgiving;impaired speech  . Pneumothorax   . Lung cancer ([place])   . CKD (chronic kidney disease), stage III     Past Surgical History  Procedure Laterality Date  . No past surgeries    . Lung biopsy  10/13/11    left  . Abdominal hysterectomy  1975  . Varicose vein surgery  14yr ago  . Video assisted thoracoscopy  11/18/2011    Procedure: VIDEO ASSISTED THORACOSCOPY;  Surgeon: SMelrose Nakayama MD;  [facility]  Service: Thoracic;  Laterality: Left;  . Lobectomy  11/18/2011    Procedure: LOBECTOMY;  Surgeon: SMelrose Nakayama MD;  [facility]  Service: Thoracic;  Laterality: Left;  LEFT UPPER LOBECTOMY  . Breast biopsy  early 2000    Family History  Problem Relation Age of Onset  . Stroke Mother   . Stroke Sister   . Anesthesia problems Neg Hx   . Hypotension Neg Hx   . Malignant hyperthermia Neg Hx   . Pseudochol deficiency Neg Hx   . Diabetes Brother   . Cancer Brother   . Stroke Brother     Social History:  reports that she has never smoked. She has never used smokeless tobacco. She reports that she does  not drink alcohol or use illicit drugs.  Allergies: No Known Allergies  Medications:  Prior to Admission:  Prescriptions prior to admission  Medication Sig Dispense Refill Last Dose  . acetaminophen (TYLENOL) 325 MG tablet Take 650 mg by mouth every 6 (six) hours as needed for mild pain or moderate pain.    unknown  . ALPRAZolam (XANAX) 0.25 MG tablet Take one tablet by mouth three times daily as needed for anxiety. Monitor for sedation (Patient taking differently: Take 0.25 mg by mouth 3 (three) times daily as needed for anxiety. ) 90 tablet 0 11/04/2015 at 1700  . alum & mag hydroxide-simeth (MAALOX/MYLANTA) 200-200-20 MG/5 SUSP Apply 1 application topically as needed. (Patient taking differently: Apply 1 application topically every 6 (six) hours as needed (for indigestion). ) 710 mL 0 unknown  . aspirin EC 81 MG tablet Take 81 mg by mouth every morning. Reported on 11/04/2015   11/04/2015 at 900a  . atenolol (TENORMIN) 25 MG tablet Take 25 mg by mouth daily.   11/04/2015 at [place] . clopidogrel (PLAVIX) 75 MG tablet Take 75 mg by mouth daily.   11/04/2015 at [place] . diphenoxylate-atropine ([name]) 2.5-0.025 MG tablet Take 1 tablet by mouth 4 (four)  times daily as needed for diarrhea or loose stools.    unknown  . Doxepin HCl 3 MG TABS Take 1 tablet (3 mg total) by mouth at bedtime. 30 tablet 2 11/03/2015 at 2100  . DULoxetine (CYMBALTA) 60 MG capsule Take 60 mg by mouth daily.   11/04/2015 at [place]  . gefitinib (IRESSA) 250 MG tablet Take 1 tablet (250 mg total) by mouth daily. 30 tablet 6 11/04/2015 at [place]  . guaiFENesin-dextromethorphan (ROBITUSSIN DM) 100-10 MG/5ML syrup Take 5 mLs by mouth every 4 (four) hours as needed for cough.   unknown  . lamoTRIgine (LAMICTAL) 25 MG tablet Take 25 mg by mouth daily.   11/04/2015 at 900  . metroNIDAZOLE (METROGEL) 0.75 % gel Apply 1 application topically daily. **Thin layer applied to the right cheek area   11/04/2015 at Unknown time  . pantoprazole (PROTONIX) 20 MG  tablet Take 1 tablet (20 mg total) by mouth 2 (two) times daily. 60 tablet 0 11/04/2015 at 1700  . senna-docusate (SENOKOT-S) 8.6-50 MG per tablet Take 1 tablet by mouth at bedtime as needed for mild constipation.   unknown  . simvastatin (ZOCOR) 20 MG tablet Take 1 tablet (20 mg total) by mouth daily. (Patient taking differently: Take 20 mg by mouth every evening. ) 30 tablet 5 11/03/2015 at 2100  . traMADol (ULTRAM) 50 MG tablet Take 1 tablet (50 mg total) by mouth every 6 (six) hours as needed. 15 tablet 0 11/04/2015 at Unknown time    Results for orders placed or performed during the hospital encounter of 11/04/15 (from the past 48 hour(s))  CBC with Differential/Platelet     Status: Abnormal   Collection Time: 11/04/15  8:00 PM  Result Value Ref Range   WBC 14.2 (H) 4.0 - 10.5 K/uL   RBC 3.68 (L) 3.87 - 5.11 MIL/uL   Hemoglobin 12.1 12.0 - 15.0 g/dL   HCT 35.9 (L) 36.0 - 46.0 %   MCV 97.6 78.0 - 100.0 fL   MCH 32.9 26.0 - 34.0 pg   MCHC 33.7 30.0 - 36.0 g/dL   RDW 12.4 11.5 - 15.5 %   Platelets 224 150 - 400 K/uL   Neutrophils Relative % 87 %   Neutro Abs 12.3 (H) 1.7 - 7.7 K/uL   Lymphocytes Relative 7 %   Lymphs Abs 1.0 0.7 - 4.0 K/uL   Monocytes Relative 5 %   Monocytes Absolute 0.7 0.1 - 1.0 K/uL   Eosinophils Relative 1 %   Eosinophils Absolute 0.2 0.0 - 0.7 K/uL   Basophils Relative 0 %   Basophils Absolute 0.0 0.0 - 0.1 K/uL  Comprehensive metabolic panel     Status: Abnormal   Collection Time: 11/04/15  8:00 PM  Result Value Ref Range   Sodium 139 135 - 145 mmol/L   Potassium 4.4 3.5 - 5.1 mmol/L   Chloride 106 101 - 111 mmol/L   CO2 24 22 - 32 mmol/L   Glucose, Bld 182 (H) 65 - 99 mg/dL   BUN 19 6 - 20 mg/dL   Creatinine, Ser 1.38 (H) 0.44 - 1.00 mg/dL   Calcium 9.0 8.9 - 10.3 mg/dL   Total Protein 7.5 6.5 - 8.1 g/dL   Albumin 3.5 3.5 - 5.0 g/dL   AST 27 15 - 41 U/L   ALT 19 14 - 54 U/L   Alkaline Phosphatase 96 38 - 126 U/L   Total Bilirubin 0.8 0.3 - 1.2 mg/dL    GFR calc non Af Amer 37 (  L) >60 mL/min   GFR calc Af Amer 43 (L) >60 mL/min    Comment: (NOTE) The eGFR has been calculated using the CKD EPI equation. This calculation has not been validated in all clinical situations. eGFR's persistently <60 mL/min signify possible Chronic Kidney Disease.    Anion gap 9 5 - 15  Type and screen     Status: None   Collection Time: 11/04/15  8:00 PM  Result Value Ref Range   ABO/RH(D) O POS    Antibody Screen NEG    Sample Expiration 11/07/2015     Dg Knee Complete 4 Views Right  11/04/2015  CLINICAL DATA:  Fall from wheelchair at nursing home prior to arrival, now with right lower extremity pain. Right hip pain radiating to the knee. Initial encounter. EXAM: RIGHT KNEE - COMPLETE 4+ VIEW COMPARISON:  None. FINDINGS: Note fracture or dislocation. 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 Detailed evaluation partially obscured related to positioning. No large joint effusion. Vascular calcifications are seen. IMPRESSION: 1. No acute fracture or dislocation. 2. 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 Electronically Signed   By: Jeb Levering M.D.   On: 11/04/2015 19:19   Dg Hip Unilat With Pelvis 2-3 Views Right  11/04/2015  CLINICAL DATA:  Pt fell from wheelchair at nursing home prior to arrival to Radiology. Severe pain in Rt hip with radiation into Rt knee. EXAM: DG HIP (WITH OR WITHOUT PELVIS) 2-3V RIGHT COMPARISON:  None. FINDINGS: Intertrochanteric fracture of the proximal RIGHT femur with varus angulation. There is superior migration of the distal fracture fragment. No dislocation. IMPRESSION: RIGHT intertrochanteric femur fracture. Electronically Signed   By: Suzy Bouchard M.D.   On: 11/04/2015 19:20    Review of Systems  Unable to perform ROS: patient nonverbal   Blood pressure 138/82, pulse 96, temperature 97.6 F (36.4 C), resp. rate 27, weight 140 lb (63.504 kg),  SpO2 92 %. Physical Exam  Constitutional: She appears well-developed and well-nourished. No distress.  HENT:  Head: Normocephalic.  Eyes: Right eye exhibits discharge. Left eye exhibits no discharge.  Neck: No JVD present. No tracheal deviation present.  Cardiovascular: Intact distal pulses.   Respiratory: Effort normal. No stridor.  GI: She exhibits no distension.  Musculoskeletal:  Right and left upper extremity appear to be well aligned without malalignment, no range of motion deficits were contractures. There is no atrophy. All joints are reduced and neurovascular exam is intact  The left lower extremity is aligned in flexion and she is lying on her side. She has hip flexion the flexion. No joint subluxation. Motor exam no atrophy. Neurovascular exam intact.  The right leg is also flexed at the hip and knee. It appears to be shortened. Muscle tone is normal. Knee and ankle are reduced. Neurovascular exam intact. Tenderness over the right greater trochanter.  Lymphadenopathy:    She has no cervical adenopathy.  Neurological: She exhibits normal muscle tone.  Cannot assess orientation due to a fascia she does appear to be alert  Skin: Skin is warm and dry. No rash noted. She is not diaphoretic. No erythema. No pallor.  Psychiatric:  She just cries out in pain and is curled up in a fetal position    Assessment/Plan: Check with On[facility] on med she is on reg surgery   RECOMMEND TRANSFER TO HIGHER LEVEL OF CARE. FOR INTERNAL FIXATION OF RIGHT HIP TO IMPROVE PULMONARY TOILET   Arther Abbott 11/05/2015, [phone]

## 2015-11-04 NOTE — H&P (Signed)
[facility] History and Physical  Stephanie [name] MEQ:[id number] DOB: 26-Jun-1941    PCP:   [name], MD   Chief Complaint: right hip Fx.   HPI: Stephanie [name] is an 75 y.o. female whom I took care of previously for TIA, with hx of prior CVA, HLD, HTN, lung cancer, prior pneumothorax, CKD3, SNF resident with DNR code status, presented to the ER after falling and right hip pain.  She had a mechanical fall and Xray showed right intertrochanteric Fx.  In the ER, she developed new onset of afib with RVR, requiring IV Cardiazem bolus and drip.  She was given IV pain meds.  Dr Aline Brochure was consulted, and planned to have ORIF Friday.  He ordered neurology consultation and bilateral carotid US.  She had recently been switched from ASA to Plavix.   Rewiew of Systems: Unable.    Past Medical History  Diagnosis Date  . CVA (cerebral infarction)     speech difficulities  . Stroke ([place])   . Hypercholesteremia     Takes Zocor daily  . Hypertension     takes Atenolol and Lisinopril daily  . Lung mass     left upper lobe  . Arthritis     hands  . TIA (transient ischemic attack)     Sat before thanksgiving;impaired speech  . Pneumothorax   . Lung cancer ([place])   . CKD (chronic kidney disease), stage III     Past Surgical History  Procedure Laterality Date  . No past surgeries    . Lung biopsy  10/13/11    left  . Abdominal hysterectomy  1975  . Varicose vein surgery  80yr ago  . Video assisted thoracoscopy  11/18/2011    Procedure: VIDEO ASSISTED THORACOSCOPY;  Surgeon: SMelrose Nakayama MD;  [facility]  Service: Thoracic;  Laterality: Left;  . Lobectomy  11/18/2011    Procedure: LOBECTOMY;  Surgeon: SMelrose Nakayama MD;  [place] [facility]  Service: Thoracic;  Laterality: Left;  LEFT UPPER LOBECTOMY  . Breast biopsy  early 2000    Medications:  HOME MEDS: Prior to Admission medications   Medication Sig Start Date End Date Taking? Authorizing Provider  acetaminophen  (TYLENOL) 325 MG tablet Take 650 mg by mouth every 6 (six) hours as needed for mild pain or moderate pain.    Yes Historical Provider, MD  ALPRAZolam (Duanne Moron 0.25 MG tablet Take one tablet by mouth three times daily as needed for anxiety. Monitor for sedation Patient taking differently: Take 0.25 mg by mouth 3 (three) times daily as needed for anxiety.  10/19/15  Yes Tiffany L Reed, DO  alum & mag hydroxide-simeth (MAALOX/MYLANTA) 200-200-20 MG/5 SUSP Apply 1 application topically as needed. Patient taking differently: Apply 1 application topically every 6 (six) hours as needed (for indigestion).  10/30/15  Yes JMerrily Pew MD  aspirin EC 81 MG tablet Take 81 mg by mouth every morning. Reported on 11/04/2015   Yes Historical Provider, MD  atenolol (TENORMIN) 25 MG tablet Take 25 mg by mouth daily.   Yes Historical Provider, MD  clopidogrel (PLAVIX) 75 MG tablet Take 75 mg by mouth daily.   Yes Historical Provider, MD  diphenoxylate-atropine ([name]) 2.5-0.025 MG tablet Take 1 tablet by mouth 4 (four) times daily as needed for diarrhea or loose stools.    Yes Historical Provider, MD  Doxepin HCl 3 MG TABS Take 1 tablet (3 mg total) by mouth at bedtime. 02/18/15  Yes TBaird Cancer PA-C  DULoxetine (CYMBALTA) 60 MG capsule Take 60 mg by mouth daily.   Yes Historical Provider, MD  gefitinib (IRESSA) 250 MG tablet Take 1 tablet (250 mg total) by mouth daily. 10/01/15  Yes Thomas S Kefalas, PA-C  guaiFENesin-dextromethorphan (ROBITUSSIN DM) 100-10 MG/5ML syrup Take 5 mLs by mouth every 4 (four) hours as needed for cough.   Yes Historical Provider, MD  lamoTRIgine (LAMICTAL) 25 MG tablet Take 25 mg by mouth daily.   Yes Historical Provider, MD  metroNIDAZOLE (METROGEL) 0.75 % gel Apply 1 application topically daily. **Thin layer applied to the right cheek area   Yes Historical Provider, MD  pantoprazole (PROTONIX) 20 MG tablet Take 1 tablet (20 mg total) by mouth 2 (two) times daily. 10/30/15  Yes Merrily Pew, MD  senna-docusate (SENOKOT-S) 8.6-50 MG per tablet Take 1 tablet by mouth at bedtime as needed for mild constipation. 03/17/15  Yes Lezlie Octave Black, NP  simvastatin (ZOCOR) 20 MG tablet Take 1 tablet (20 mg total) by mouth daily. Patient taking differently: Take 20 mg by mouth every evening.  08/27/14  Yes Kathyrn Drown, MD  traMADol (ULTRAM) 50 MG tablet Take 1 tablet (50 mg total) by mouth every 6 (six) hours as needed. 04/11/15  Yes Fredia Sorrow, MD     Allergies:  No Known Allergies  Social History:   reports that she has never smoked. She has never used smokeless tobacco. She reports that she does not drink alcohol or use illicit drugs.  Family History: Family History  Problem Relation Age of Onset  . Stroke Mother   . Stroke Sister   . Anesthesia problems Neg Hx   . Hypotension Neg Hx   . Malignant hyperthermia Neg Hx   . Pseudochol deficiency Neg Hx   . Diabetes Brother   . Cancer Brother   . Stroke Brother      Physical Exam: Filed Vitals:   11/04/15 1928 11/04/15 2034 11/04/15 2222  BP: 164/88 138/82 123/94  Pulse: 72 96   Temp: 97.6 F (36.4 C)    Resp: '20 27 15  '$ Weight: 63.504 kg (140 lb)    SpO2: 94% 92% 94%   Blood pressure 123/94, pulse 96, temperature 97.6 F (36.4 C), resp. rate 15, weight 63.504 kg (140 lb), SpO2 94 %.  GEN:  Pleasant patient lying in the stretcher in no acute distress; cooperative with exam. PSYCH:  alert and oriented x4; does not appear anxious or depressed; affect is appropriate. HEENT: Mucous membranes pink and anicteric; PERRLA; EOM intact; no cervical lymphadenopathy nor thyromegaly or carotid bruit; no JVD; There were no stridor. Neck is very supple. Breasts:: Not examined CHEST WALL: No tenderness CHEST: Normal respiration, clear to auscultation bilaterally.  HEART: Regular rate and rhythm.  There are no murmur, rub, or gallops.   BACK: No kyphosis or scoliosis; no CVA tenderness ABDOMEN: soft and non-tender; no  masses, no organomegaly, normal abdominal bowel sounds; no pannus; no intertriginous candida. There is no rebound and no distention. Rectal Exam: Not done EXTREMITIES: No bone or joint deformity; age-appropriate arthropathy of the hands and knees; no edema; no ulcerations.  There is no calf tenderness. I did not examine her right hip.  Genitalia: not examined PULSES: 2+ and symmetric SKIN: Normal hydration no rash or ulceration CNS: Cranial nerves 2-12 grossly intact no focal lateralizing neurologic deficit.  Speech is fluent; uvula elevated with phonation, facial symmetry and tongue midline. DTR are normal bilaterally, cerebella exam is intact, barbinski is negative and  strengths are equaled bilaterally.  No sensory loss.   Labs on Admission:  Basic Metabolic Panel:  Recent Labs Lab 10/30/15 1435 11/03/15 1113 11/04/15 2000  NA 137 141 139  K 3.6 4.0 4.4  CL 104 107 106  CO2 '28 27 24  '$ GLUCOSE 105* 101* 182*  BUN '18 17 19  '$ CREATININE 1.17* 1.35* 1.38*  CALCIUM 8.9 9.1 9.0   Liver Function Tests:  Recent Labs Lab 10/30/15 1435 11/03/15 1113 11/04/15 2000  AST '22 21 27  '$ ALT 14 12* 19  ALKPHOS 98 93 96  BILITOT 0.6 0.6 0.8  PROT 7.2 7.1 7.5  ALBUMIN 3.2* 3.2* 3.5   CBC:  Recent Labs Lab 10/30/15 1435 11/03/15 1113 11/04/15 2000  WBC 7.2 6.1 14.2*  NEUTROABS 4.8 4.3 12.3*  HGB 12.5 12.6 12.1  HCT 37.8 38.2 35.9*  MCV 99.7 99.7 97.6  PLT 208 205 224   Cardiac Enzymes:  Recent Labs Lab 10/30/15 1435  TROPONINI <0.03    Radiological Exams on Admission: Dg Knee Complete 4 Views Right  11/04/2015  CLINICAL DATA:  Fall from wheelchair at nursing home prior to arrival, now with right lower extremity pain. Right hip pain radiating to the knee. Initial encounter. EXAM: RIGHT KNEE - COMPLETE 4+ VIEW COMPARISON:  None. FINDINGS: Note fracture or dislocation. 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  Detailed evaluation partially obscured related to positioning. No large joint effusion. Vascular calcifications are seen. IMPRESSION: 1. No acute fracture or dislocation. 2. 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 Electronically Signed   By: Jeb Levering M.D.   On: 11/04/2015 19:19   Dg Hip Unilat With Pelvis 2-3 Views Right  11/04/2015  CLINICAL DATA:  Pt fell from wheelchair at nursing home prior to arrival to Radiology. Severe pain in Rt hip with radiation into Rt knee. EXAM: DG HIP (WITH OR WITHOUT PELVIS) 2-3V RIGHT COMPARISON:  None. FINDINGS: Intertrochanteric fracture of the proximal RIGHT femur with varus angulation. There is superior migration of the distal fracture fragment. No dislocation. IMPRESSION: RIGHT intertrochanteric femur fracture. Electronically Signed   By: Suzy Bouchard M.D.   On: 11/04/2015 19:20    EKG: Independently reviewed. afib with RVR.    Assessment/Plan Present on Admission:  . Hip fracture, right ([place]) . Adenocarcinoma of left lung, stage 1 ([place]) . Bilateral carotid artery disease ([place]) . Hyperlipidemia . CKD (chronic kidney disease) . Hip fracture ([facility])  PLAN:  Right hip Fx:  She is clear for ORIF, given that it is the best Tx option for her, and no further optimization for her medical condition is required, if her afib can be rate controlled.   It is unlikely that we can optimize her carotid stenosis any more than her current disease, but will defer to Dr Aline Brochure as per his orders.   Will hold Plavix and restart her ASA at this time.  I will give her a diet at this time, and Lovenox DVT prophylaxis.  TIA:  Continue with ASA.  CKD:  Avoid nephrotoxic drug and follow Cr.  Will give gentle IVF.  New onset of afib:  Will admit to ICU, continue with IV cardiazem.  Her afib has been in the setting of pain and Fx hip.       WIll continue with ASA.  She may be a poor candidate for full anticoagulation.  Will order ECHO. '  HLD:  Continue  with statin.  HTN:  She has hypotension in the ER, so  we will hold her anti HTN meds.   Other plans as per orders. Code Status: DNR.    Orvan Falconer, MD. FACP [facility] Pager [phone] 7pm to 7am.  11/04/2015, 10:31 PM

## 2015-11-04 NOTE — ED Notes (Signed)
Other half of dilaudid given (0.'5mg'$ ) at this time per vo Dr. Truman Hayward. Patient slightly restless prior to dilaudid given.

## 2015-11-04 NOTE — ED Notes (Signed)
Dr Truman Hayward in with pt and family and patient went into A Fib with rate of 145. Cardizem '10mg'$  bolus given and '5mg'$ /hr started per vo Dr. Truman Hayward. At 2210.

## 2015-11-04 NOTE — Patient Instructions (Signed)
[place] at [facility] Discharge Instructions  RECOMMENDATIONS MADE BY THE CONSULTANT AND ANY TEST RESULTS WILL BE SENT TO YOUR REFERRING PHYSICIAN.  Exam and discussion by Robynn Pane, PA-C Will continue Iressa for now.  If has continued ED visits, hospitalzations, will consider stopping Iressa  Follow-up: Labs in 8 weeks Office visit after labs.  Thank you for choosing [place] at [facility] to provide your oncology and hematology care.  To afford each patient quality time with our provider, please arrive at least 15 minutes before your scheduled appointment time.    You need to re-schedule your appointment should you arrive 10 or more minutes late.  We strive to give you quality time with our providers, and arriving late affects you and other patients whose appointments are after yours.  Also, if you no show three or more times for appointments you may be dismissed from the clinic at the providers discretion.     Again, thank you for choosing [facility].  Our hope is that these requests will decrease the amount of time that you wait before being seen by our physicians.       _____________________________________________________________  Should you have questions after your visit to [facility], please contact our office at [phone] between the hours of 8:30 a.m. and 4:30 p.m.  Voicemails left after 4:30 p.m. will not be returned until the following business day.  For prescription refill requests, have your pharmacy contact our office.

## 2015-11-05 ENCOUNTER — Inpatient Hospital Stay (HOSPITAL_COMMUNITY): Payer: Medicare Other

## 2015-11-05 DIAGNOSIS — S72001A Fracture of unspecified part of neck of right femur, initial encounter for closed fracture: Secondary | ICD-10-CM | POA: Diagnosis present

## 2015-11-05 DIAGNOSIS — I4891 Unspecified atrial fibrillation: Secondary | ICD-10-CM

## 2015-11-05 LAB — URINALYSIS, ROUTINE W REFLEX MICROSCOPIC
Bilirubin Urine: NEGATIVE
Glucose, UA: NEGATIVE mg/dL
Ketones, ur: NEGATIVE mg/dL
LEUKOCYTES UA: NEGATIVE
Nitrite: NEGATIVE
PROTEIN: 30 mg/dL — AB
Specific Gravity, Urine: 1.025 (ref 1.005–1.030)
pH: 5.5 (ref 5.0–8.0)

## 2015-11-05 LAB — URINE MICROSCOPIC-ADD ON

## 2015-11-05 LAB — TSH: TSH: 6.114 u[IU]/mL — AB (ref 0.350–4.500)

## 2015-11-05 MED ORDER — SODIUM CHLORIDE 0.9 % IV SOLN
INTRAVENOUS | Status: DC
  Administered 2015-11-05 – 2015-11-09 (×10): via INTRAVENOUS

## 2015-11-05 MED ORDER — SODIUM CHLORIDE 0.9 % IJ SOLN
3.0000 mL | Freq: Two times a day (BID) | INTRAMUSCULAR | Status: DC
  Administered 2015-11-05 – 2015-11-13 (×11): 3 mL via INTRAVENOUS

## 2015-11-05 MED ORDER — ASPIRIN EC 81 MG PO TBEC
81.0000 mg | DELAYED_RELEASE_TABLET | Freq: Every morning | ORAL | Status: DC
  Administered 2015-11-06 – 2015-11-07 (×2): 81 mg via ORAL
  Filled 2015-11-05 (×2): qty 1

## 2015-11-05 MED ORDER — LAMOTRIGINE 25 MG PO TABS
25.0000 mg | ORAL_TABLET | Freq: Every day | ORAL | Status: DC
  Administered 2015-11-05 – 2015-11-07 (×2): 25 mg via ORAL
  Filled 2015-11-05 (×7): qty 1

## 2015-11-05 MED ORDER — ACETAMINOPHEN 325 MG PO TABS
650.0000 mg | ORAL_TABLET | Freq: Four times a day (QID) | ORAL | Status: DC | PRN
  Administered 2015-11-07: 650 mg via ORAL
  Filled 2015-11-05: qty 2

## 2015-11-05 MED ORDER — HYDROMORPHONE HCL 1 MG/ML IJ SOLN
0.5000 mg | Freq: Once | INTRAMUSCULAR | Status: AC
Start: 2015-11-05 — End: 2015-11-05
  Administered 2015-11-05: 0.5 mg via INTRAVENOUS
  Filled 2015-11-05: qty 1

## 2015-11-05 MED ORDER — PANTOPRAZOLE SODIUM 20 MG PO TBEC
20.0000 mg | DELAYED_RELEASE_TABLET | Freq: Two times a day (BID) | ORAL | Status: DC
  Filled 2015-11-05 (×3): qty 1

## 2015-11-05 MED ORDER — ONDANSETRON HCL 4 MG/2ML IJ SOLN: 4.0000 mg | Freq: Four times a day (QID) | INTRAMUSCULAR | Status: DC | PRN

## 2015-11-05 MED ORDER — DULOXETINE HCL 60 MG PO CPEP
60.0000 mg | ORAL_CAPSULE | Freq: Every day | ORAL | Status: DC
  Administered 2015-11-06 – 2015-11-07 (×2): 60 mg via ORAL
  Filled 2015-11-05 (×2): qty 1

## 2015-11-05 MED ORDER — SODIUM CHLORIDE 0.9 % IV BOLUS (SEPSIS)
500.0000 mL | Freq: Once | INTRAVENOUS | Status: AC
  Administered 2015-11-05: 500 mL via INTRAVENOUS

## 2015-11-05 MED ORDER — ONDANSETRON HCL 4 MG PO TABS: 4.0000 mg | ORAL_TABLET | Freq: Four times a day (QID) | ORAL | Status: DC | PRN

## 2015-11-05 MED ORDER — ENOXAPARIN SODIUM 30 MG/0.3ML ~~LOC~~ SOLN
30.0000 mg | SUBCUTANEOUS | Status: DC
  Administered 2015-11-05: 30 mg via SUBCUTANEOUS
  Filled 2015-11-05: qty 0.3

## 2015-11-05 MED ORDER — DILTIAZEM LOAD VIA INFUSION: 10.0000 mg | Freq: Once | INTRAVENOUS | Status: DC

## 2015-11-05 MED ORDER — ENOXAPARIN SODIUM 40 MG/0.4ML ~~LOC~~ SOLN
40.0000 mg | SUBCUTANEOUS | Status: DC
  Administered 2015-11-06: 40 mg via SUBCUTANEOUS
  Filled 2015-11-05: qty 0.4

## 2015-11-05 MED ORDER — SIMVASTATIN 20 MG PO TABS
20.0000 mg | ORAL_TABLET | Freq: Every evening | ORAL | Status: DC
  Filled 2015-11-05 (×2): qty 1

## 2015-11-05 MED ORDER — SENNOSIDES-DOCUSATE SODIUM 8.6-50 MG PO TABS
1.0000 | ORAL_TABLET | Freq: Every evening | ORAL | Status: DC | PRN
Start: 2015-11-05 — End: 2015-11-17

## 2015-11-05 MED ORDER — LORAZEPAM 2 MG/ML IJ SOLN
0.5000 mg | INTRAMUSCULAR | Status: AC
  Administered 2015-11-05: 0.5 mg via INTRAVENOUS
  Filled 2015-11-05: qty 1

## 2015-11-05 MED ORDER — DILTIAZEM HCL 30 MG PO TABS
30.0000 mg | ORAL_TABLET | Freq: Four times a day (QID) | ORAL | Status: DC
  Administered 2015-11-06 (×2): 30 mg via ORAL
  Filled 2015-11-05 (×2): qty 1

## 2015-11-05 MED ORDER — SODIUM CHLORIDE 0.9 % IV BOLUS (SEPSIS)
1000.0000 mL | Freq: Once | INTRAVENOUS | Status: AC
  Administered 2015-11-05: 1000 mL via INTRAVENOUS

## 2015-11-05 MED ORDER — HYDROMORPHONE HCL 1 MG/ML IJ SOLN
0.5000 mg | INTRAMUSCULAR | Status: DC | PRN
Start: 2015-11-05 — End: 2015-11-06
  Administered 2015-11-05 – 2015-11-06 (×9): 0.5 mg via INTRAVENOUS
  Filled 2015-11-05 (×9): qty 1

## 2015-11-05 MED ORDER — DILTIAZEM HCL 100 MG IV SOLR: 5.0000 mg/h | INTRAVENOUS | Status: DC

## 2015-11-05 MED ORDER — PANTOPRAZOLE SODIUM 40 MG PO TBEC
40.0000 mg | DELAYED_RELEASE_TABLET | Freq: Every day | ORAL | Status: DC
  Administered 2015-11-06 – 2015-11-07 (×2): 40 mg via ORAL
  Filled 2015-11-05 (×3): qty 1

## 2015-11-05 NOTE — Progress Notes (Signed)
[facility] PROGRESS NOTE   DINEEN CONRADT FGH:[id number] DOB: [birth year] DOA: 11/04/2015 PCP: [name], MD  HPI/Subjective: Patient is aphasic, seen with daughter [name] at bedside. Currently sinus rhythm with heart rate in the 60s  Assessment/Plan: Principal Problem:   Hip fracture, right (HCC) Active Problems:   Adenocarcinoma of left lung, stage 1 (HCC)   Bilateral carotid artery disease (HCC)   Hyperlipidemia   CKD (chronic kidney disease)   Hip fracture (HCC)   Closed right hip fracture (HCC)   Atrial fibrillation (HCC)   Right hip Fracture -Present with right hip fracture secondary to a fall. -Evaluated by Dr. Aline Brochure of orthopedics and eventually she needs ORIF. -Because of comorbidities, lack of cardiology and anesthesiology during the weekend he recommended transfer to [facility]. -I do know if anything else can be done to optimize her medical status prior to surgery. -She was in atrial fibrillation with RVR, currently normal sinus rhythm. Has carotid stenosis. -Patient is on Plavix, as discontinued and started on aspirin, she might need Plavix after surgery or even anticoagulation.  New onset of A. fib -Admitted initially to [facility] ICU, started on IV Cardizem, EKG showed atrial flutter with 2:1 conduction. -Currently on normal sinus rhythm heart rate 60-70. -Atrial fibrillation is likely transient versus paroxysmal (patient has history of TIA/stroke). -2-D echo pending, She may be a poor candidate for full anticoagulation.   TIA/stroke -Has history of stroke in 2012, recent history of TIA in December 2016. -Patient is on Plavix, held for the upcoming surgery, placed on aspirin.  Acute renal failure on CKD stage III. -Patient has CKD stage III baseline creatinine 1.1, presented with creatinine 1.3. -Started on IV fluids, follow closely, avoid fluid overload  History of lung cancer stage I History of lung cancer diagnosed January 2013 with surgical resection,  was T2a, pN0. Continue Iressa, per last nephrology note question of progression of adenocarcinoma but not proven by biopsy.  HLD -Continue with statin.  HTN -She has hypotension in the ER, so we will hold her anti HTN meds.  Code Status: DNR Family Communication: Plan discussed with the patient. Disposition Plan: Remains inpatient Diet: DIET - DYS 1 Room service appropriate?: Yes; Fluid consistency:: Thin  Consultants:  Ortho  Procedures:  None  Antibiotics:  None   Objective: Filed Vitals:   11/05/15 0809 11/05/15 1145  BP:    Pulse:    Temp: 96.8 F (36 C) 97.3 F (36.3 C)  Resp:      Intake/Output Summary (Last 24 hours) at 11/05/15 1155 Last data filed at 11/05/15 0600  Gross per 24 hour  Intake 450.67 ml  Output      0 ml  Net 450.67 ml   Filed Weights   11/04/15 1928 11/05/15 0110 11/05/15 0500  Weight: 63.504 kg (140 lb) 65.7 kg (144 lb 13.5 oz) 65.7 kg (144 lb 13.5 oz)    Exam: General: Alert and awake, oriented x3, not in any acute distress. HEENT: anicteric sclera, pupils reactive to light and accommodation, EOMI CVS: S1-S2 clear, no murmur rubs or gallops Chest: clear to auscultation bilaterally, no wheezing, rales or rhonchi Abdomen: soft nontender, nondistended, normal bowel sounds, no organomegaly Extremities: no cyanosis, clubbing or edema noted bilaterally Neuro: Cranial nerves II-XII intact, no focal neurological deficits  Data Reviewed: Basic Metabolic Panel:  Recent Labs Lab 10/30/15 1435 11/03/15 1113 11/04/15 2000  NA 137 141 139  K 3.6 4.0 4.4  CL 104 107 106  CO2 28 27 24  GLUCOSE 105* 101* 182*  BUN '18 17 19  '$ CREATININE 1.17* 1.35* 1.38*  CALCIUM 8.9 9.1 9.0   Liver Function Tests:  Recent Labs Lab 10/30/15 1435 11/03/15 1113 11/04/15 2000  AST '22 21 27  '$ ALT 14 12* 19  ALKPHOS 98 93 96  BILITOT 0.6 0.6 0.8  PROT 7.2 7.1 7.5  ALBUMIN 3.2* 3.2* 3.5   No results for input(s): LIPASE, AMYLASE in the last 168  hours. No results for input(s): AMMONIA in the last 168 hours. CBC:  Recent Labs Lab 10/30/15 1435 11/03/15 1113 11/04/15 2000  WBC 7.2 6.1 14.2*  NEUTROABS 4.8 4.3 12.3*  HGB 12.5 12.6 12.1  HCT 37.8 38.2 35.9*  MCV 99.7 99.7 97.6  PLT 208 205 224   Cardiac Enzymes:  Recent Labs Lab 10/30/15 1435  TROPONINI <0.03   BNP (last 3 results) No results for input(s): BNP in the last 8760 hours.  ProBNP (last 3 results) No results for input(s): PROBNP in the last 8760 hours.  CBG: No results for input(s): GLUCAP in the last 168 hours.  Micro No results found for this or any previous visit (from the past 240 hour(s)).   Studies: US Carotid Bilateral  11/05/2015  CLINICAL DATA:  Recent right femoral fracture and history of TIAs EXAM: BILATERAL CAROTID DUPLEX ULTRASOUND TECHNIQUE: Pearline Cables scale imaging, color Doppler and duplex ultrasound were performed of bilateral carotid and vertebral arteries in the neck. COMPARISON:  02/06/2013 FINDINGS: Criteria: Quantification of carotid stenosis is based on velocity parameters that correlate the residual internal carotid diameter with NASCET-based stenosis levels, using the diameter of the distal internal carotid lumen as the denominator for stenosis measurement. The following velocity measurements were obtained: RIGHT ICA:  118/20 cm/sec CCA:  761/60 cm/sec SYSTOLIC ICA/CCA RATIO:  0.6 DIASTOLIC ICA/CCA RATIO:  0.8 ECA:  102 cm/sec LEFT ICA:  141/25 cm/sec CCA:  737/10 cm/sec SYSTOLIC ICA/CCA RATIO:  1.3 DIASTOLIC ICA/CCA RATIO:  1.5 ECA:  119 cm/sec RIGHT CAROTID ARTERY: Grayscale images demonstrate intimal thickening within the common carotid artery as well as plaque formation in the carotid bulb and extending into the proximal internal carotid artery. The waveforms, velocities and flow velocity ratios show no evidence of focal hemodynamically significant stenosis. RIGHT VERTEBRAL ARTERY:  Antegrade in nature. LEFT CAROTID ARTERY: Intimal thickening  is noted within the common carotid artery on the left. Plaque formation in the carotid bulb and extending into the proximal internal carotid artery is noted. The waveforms, velocities and flow velocity ratios again demonstrate a stenosis in the 50-69% range likely in the lower end of that spectrum. LEFT VERTEBRAL ARTERY:  Antegrade in nature. 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 No significant stenosis on the right is noted. Electronically Signed   By: Inez Catalina M.D.   On: 11/05/2015 11:50   Dg Chest Portable 1 View  11/04/2015  CLINICAL DATA:  Status post fall from wheelchair. Right shoulder pain. Personal history of lung cancer status post left upper lobectomy. Initial encounter. EXAM: PORTABLE CHEST 1 VIEW COMPARISON:  Chest radiograph and CTA of the chest performed 10/30/2015 FINDINGS: The appearance of the lungs is stable from the prior study, reflecting diffuse interstitial lung disease with fibrotic change, worse at the left lung base. Known small pulmonary nodules are not well characterized on radiograph. No definite pleural effusion or pneumothorax is seen. The cardiomediastinal silhouette is borderline normal in size. No acute osseous abnormalities are seen. IMPRESSION: 1. Appearance  of the lungs is stable from the prior study, reflecting diffuse interstitial lung disease with fibrotic change, worse at the left lung base. Known small pulmonary nodules are not well characterized on radiograph. 2. No displaced rib fracture seen. Electronically Signed   By: Garald Balding M.D.   On: 11/04/2015 23:50   Dg Shoulder Right Port  11/04/2015  CLINICAL DATA:  Fall from wheelchair.  RIGHT shoulder pain EXAM: PORTABLE RIGHT SHOULDER - 2+ VIEW COMPARISON:  None. FINDINGS: Glenohumeral joint is intact. No evidence of scapular fracture or humeral fracture. The acromioclavicular joint is intact. IMPRESSION: No fracture or dislocation. Electronically  Signed   By: Suzy Bouchard M.D.   On: 11/04/2015 23:49   Dg Knee Complete 4 Views Right  11/04/2015  CLINICAL DATA:  Fall from wheelchair at nursing home prior to arrival, now with right lower extremity pain. Right hip pain radiating to the knee. Initial encounter. EXAM: RIGHT KNEE - COMPLETE 4+ VIEW COMPARISON:  None. FINDINGS: Note fracture or dislocation. 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 Detailed evaluation partially obscured related to positioning. No large joint effusion. Vascular calcifications are seen. IMPRESSION: 1. No acute fracture or dislocation. 2. 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 Electronically Signed   By: Jeb Levering M.D.   On: 11/04/2015 19:19   Dg Hip Unilat With Pelvis 2-3 Views Right  11/04/2015  CLINICAL DATA:  Pt fell from wheelchair at nursing home prior to arrival to Radiology. Severe pain in Rt hip with radiation into Rt knee. EXAM: DG HIP (WITH OR WITHOUT PELVIS) 2-3V RIGHT COMPARISON:  None. FINDINGS: Intertrochanteric fracture of the proximal RIGHT femur with varus angulation. There is superior migration of the distal fracture fragment. No dislocation. IMPRESSION: RIGHT intertrochanteric femur fracture. Electronically Signed   By: Suzy Bouchard M.D.   On: 11/04/2015 19:20    Scheduled Meds: . sodium chloride   Intravenous Once  . aspirin EC  81 mg Oral q morning - 10a  . diltiazem  10 mg Intravenous Once  . DULoxetine  60 mg Oral Daily  . enoxaparin (LOVENOX) injection  30 mg Subcutaneous Q24H  . lamoTRIgine  25 mg Oral Daily  . pantoprazole  40 mg Oral Daily  . simvastatin  20 mg Oral QPM  . sodium chloride  3 mL Intravenous Q12H   Continuous Infusions: . sodium chloride 50 mL/hr at 11/05/15 0600  . diltiazem (CARDIZEM) infusion 2.5 mg/hr (11/05/15 0600)       Time spent: 35 minutes    [facility]  [facility] Pager [phone] If 7PM-7AM, please  contact night-coverage at [email or web address], password [facility] 11/05/2015, 11:55 AM  LOS: 1 day

## 2015-11-05 NOTE — Progress Notes (Signed)
LABS DRAWN

## 2015-11-05 NOTE — Progress Notes (Signed)
Care-link report given in person and RN @ [facility] [phone].  Patient family both daughters called and are aware that she is going to 22.  Patient doesn't like clothes or gown.  She wears nothing at the [facility] center per family.  She doesn't like a sheet just a blanket and doesn't want her curtains closed because she likes to see out.

## 2015-11-05 NOTE — Progress Notes (Signed)
Patient ID: Stephanie [name], female   DOB: 1941/02/23, 75 y.o.   MRN: [id number]  RIGHT HIP FRACTURE H/O CVA H/O TIA H/O CA LUNG H/O APHASIA STRESS INDUCED AFIB   STRONGLY RECOMEND TRANSFER TO HIGHER LEVEL OF CARE SECONDARY TO: *RECENT TIA/STROKE WORK UP IN December *AFIB *APHASIA *FRIDAY IS THE EARLIEST SURGERY CAN BE DONE, AND THEN THE WEEKEND AND DECREASED SUPPORT STAFF/SPECIALTYCONSULTS AVAILABLE

## 2015-11-06 ENCOUNTER — Inpatient Hospital Stay (HOSPITAL_COMMUNITY): Payer: Medicare Other

## 2015-11-06 DIAGNOSIS — I480 Paroxysmal atrial fibrillation: Secondary | ICD-10-CM

## 2015-11-06 MED ORDER — LORAZEPAM 2 MG/ML IJ SOLN
0.5000 mg | Freq: Three times a day (TID) | INTRAMUSCULAR | Status: DC | PRN
  Administered 2015-11-06 – 2015-11-10 (×5): 0.5 mg via INTRAVENOUS
  Filled 2015-11-06 (×5): qty 1

## 2015-11-06 MED ORDER — METOPROLOL TARTRATE 12.5 MG HALF TABLET
12.5000 mg | ORAL_TABLET | Freq: Two times a day (BID) | ORAL | Status: DC
  Administered 2015-11-06 – 2015-11-07 (×4): 12.5 mg via ORAL
  Filled 2015-11-06 (×5): qty 1

## 2015-11-06 MED ORDER — VANCOMYCIN HCL IN DEXTROSE 1-5 GM/200ML-% IV SOLN
1000.0000 mg | INTRAVENOUS | Status: AC
  Administered 2015-11-08: 1000 mg via INTRAVENOUS
  Filled 2015-11-06 (×2): qty 200

## 2015-11-06 MED ORDER — CHLORHEXIDINE GLUCONATE CLOTH 2 % EX PADS
6.0000 | MEDICATED_PAD | Freq: Every day | CUTANEOUS | Status: AC
  Administered 2015-11-06 – 2015-11-10 (×5): 6 via TOPICAL

## 2015-11-06 MED ORDER — HYDROMORPHONE HCL 1 MG/ML IJ SOLN
1.0000 mg | INTRAMUSCULAR | Status: DC | PRN
  Administered 2015-11-06 – 2015-11-07 (×4): 1 mg via INTRAVENOUS
  Filled 2015-11-06 (×4): qty 1

## 2015-11-06 MED ORDER — AMIODARONE LOAD VIA INFUSION
150.0000 mg | Freq: Once | INTRAVENOUS | Status: DC
Start: 2015-11-06 — End: 2015-11-06
  Filled 2015-11-06: qty 83.34

## 2015-11-06 MED ORDER — CETYLPYRIDINIUM CHLORIDE 0.05 % MT LIQD
7.0000 mL | Freq: Two times a day (BID) | OROMUCOSAL | Status: DC
  Administered 2015-11-06 – 2015-11-16 (×14): 7 mL via OROMUCOSAL

## 2015-11-06 MED ORDER — MUPIROCIN 2 % EX OINT
1.0000 "application " | TOPICAL_OINTMENT | Freq: Two times a day (BID) | CUTANEOUS | Status: AC
  Administered 2015-11-06 – 2015-11-10 (×8): 1 via NASAL
  Filled 2015-11-06 (×2): qty 22

## 2015-11-06 MED ORDER — AMIODARONE HCL IN DEXTROSE 360-4.14 MG/200ML-% IV SOLN: 30.0000 mg/h | INTRAVENOUS | Status: DC

## 2015-11-06 MED ORDER — AMIODARONE HCL 200 MG PO TABS
400.0000 mg | ORAL_TABLET | Freq: Every day | ORAL | Status: DC
  Administered 2015-11-06 – 2015-11-07 (×2): 400 mg via ORAL
  Filled 2015-11-06 (×5): qty 2

## 2015-11-06 MED ORDER — AMIODARONE HCL IN DEXTROSE 360-4.14 MG/200ML-% IV SOLN: 60.0000 mg/h | INTRAVENOUS | Status: DC

## 2015-11-06 NOTE — Progress Notes (Signed)
Physician notified: Sullivan----Harwani At: [id number]  Regarding: Pt converted by self to NSR HR 80s. Still want amio gtt started? PO cardizem due at noon.  Awaiting return response.   Returned Response at: [phone]  Order(s): amio PO '400mg'$  BID, start now.

## 2015-11-06 NOTE — Consult Note (Signed)
Stephanie Lange, MD Chief Complaint: Right hip fracture History: HPI: 74 year old female a phasic so had to take the history from the ER record and the history and physical dictated by Dr. Truman [name]. Basically she had a stroke workup in December 2016 unclear if she actually had a stroke or TIA. She was successfully treated with back to the nursing home where she is a household ambulator bed to chair stand with assist ambulator.  Review of systems deferred as well because of aphasia Past Medical History  Diagnosis Date  . CVA (cerebral infarction)     speech difficulities  . Stroke (Stephanie [name])   . Hypercholesteremia     Takes Zocor daily  . Hypertension     takes Atenolol and Lisinopril daily  . Lung mass     left upper lobe  . Arthritis     hands  . TIA (transient ischemic attack)     Sat before thanksgiving;impaired speech  . Pneumothorax   . Lung cancer ([name])   . CKD (chronic kidney disease), stage III     No Known Allergies  No current facility-administered medications on file prior to encounter.   Current Outpatient Prescriptions on File Prior to Encounter  Medication Sig Dispense Refill  . acetaminophen (TYLENOL) 325 MG tablet Take 650 mg by mouth every 6 (six) hours as needed for mild pain or moderate pain.     Marland Kitchen ALPRAZolam (XANAX) 0.25 MG tablet Take one tablet by mouth three times daily as needed for anxiety. Monitor for sedation (Patient taking differently: Take 0.25 mg by mouth 3 (three) times daily as needed for anxiety. ) 90 tablet 0  . alum & mag hydroxide-simeth (MAALOX/MYLANTA) 200-200-20 MG/5 SUSP Apply 1 application topically as needed. (Patient taking differently: Apply 1 application topically every 6 (six) hours as needed (for indigestion). ) 710 mL 0  . aspirin EC 81 MG tablet Take 81 mg by mouth every morning. Reported on 11/04/2015    . atenolol (TENORMIN) 25 MG tablet Take 25 mg by mouth daily.    . clopidogrel (PLAVIX) 75 MG tablet Take 75 mg by mouth daily.    .  diphenoxylate-atropine ([name]) 2.5-0.025 MG tablet Take 1 tablet by mouth 4 (four) times daily as needed for diarrhea or loose stools.     . Doxepin HCl 3 MG TABS Take 1 tablet (3 mg total) by mouth at bedtime. 30 tablet 2  . DULoxetine (CYMBALTA) 60 MG capsule Take 60 mg by mouth daily.    Marland Kitchen gefitinib (IRESSA) 250 MG tablet Take 1 tablet (250 mg total) by mouth daily. 30 tablet 6  . guaiFENesin-dextromethorphan (ROBITUSSIN DM) 100-10 MG/5ML syrup Take 5 mLs by mouth every 4 (four) hours as needed for cough.    . lamoTRIgine (LAMICTAL) 25 MG tablet Take 25 mg by mouth daily.    . metroNIDAZOLE (METROGEL) 0.75 % gel Apply 1 application topically daily. **Thin layer applied to the right cheek area    . pantoprazole (PROTONIX) 20 MG tablet Take 1 tablet (20 mg total) by mouth 2 (two) times daily. 60 tablet 0  . senna-docusate (SENOKOT-S) 8.6-50 MG per tablet Take 1 tablet by mouth at bedtime as needed for mild constipation.    . simvastatin (ZOCOR) 20 MG tablet Take 1 tablet (20 mg total) by mouth daily. (Patient taking differently: Take 20 mg by mouth every evening. ) 30 tablet 5  . traMADol (ULTRAM) 50 MG tablet Take 1 tablet (50 mg total) by mouth every 6 (six) hours as needed.  15 tablet 0    Physical Exam: Filed Vitals:   11/06/15 0814 11/06/15 1200  BP:  91/63  Pulse:    Temp: 97.9 F (36.6 C) 99.2 F (37.3 C)  Resp:    Patient with baseline confusion. Non-verbal communication Right leg: compartments soft/NT No laceration/abrasion Hip remains flexed/rotated.  Unable/unwilling to extend hip/leg Left - no pain with ROM/palpation 1+ DP/PT pulses  Image: Ct Head Wo Contrast  10/22/2015  CLINICAL DATA:  Code stroke EXAM: CT HEAD WITHOUT CONTRAST TECHNIQUE: Contiguous axial images were obtained from the base of the skull through the vertex without intravenous contrast. COMPARISON:  03/16/2015 FINDINGS: There is prominence of the sulci and ventricles identified compatible with brain  atrophy. Chronic lacunar infarct within the right centrum semiovale is identified and appears similar to previous exam. Mild low attenuation throughout the subcortical and periventricular white matter is identified compatible with chronic microvascular disease. No evidence for acute intracranial hemorrhage, cortical infarct or mass. No abnormal extra-axial fluid collections noted. The paranasal sinuses and the mastoid air cells are clear. The calvarium is intact. IMPRESSION: 1. No acute intracranial abnormalities. 2. Chronic small vessel ischemic disease and brain atrophy. These results were called by telephone at the time of interpretation on 10/22/2015 at 9:32 am to Dr. Julianne [name] , who verbally acknowledged these results. Electronically Signed   By: Kerby Moors M.D.   On: 10/22/2015 09:32   Ct Angio Chest Pe W/cm &/or Wo Cm  10/30/2015  CLINICAL DATA:  75 year old female with history of chest pain. History of lung cancer status post lobectomy in 2013. EXAM: CT ANGIOGRAPHY CHEST WITH CONTRAST TECHNIQUE: Multidetector CT imaging of the chest was performed using the standard protocol during bolus administration of intravenous contrast. Multiplanar CT image reconstructions and MIPs were obtained to evaluate the vascular anatomy. CONTRAST:  159m OMNIPAQUE IOHEXOL 350 MG/ML SOLN COMPARISON:  Chest CT 09/02/2015. FINDINGS: Comment: Study is severely limited by a large amount of patient respiratory motion. Because of this, accurate assessment for distal segmental and subsegmental sized emboli is not possible. Mediastinum/Lymph Nodes: Despite the limitations of today's examination, there are no central, lobar or proximal segmental sized filling defects within the pulmonary arteries to suggest pulmonary embolism. Distal segmental and subsegmental sized vessels cannot be accurately assessed secondary to extensive patient respiratory motion. Heart size is normal. There is no significant pericardial fluid,  thickening or pericardial calcification. There is atherosclerosis of the thoracic aorta, the great vessels of the mediastinum and the coronary arteries, including calcified atherosclerotic plaque in the left main, left anterior descending, left circumflex and right coronary arteries. Severe calcifications of the mitral annulus. Shift of cardiomediastinal structures in the left hemithorax related to prior left upper lobectomy and associated volume loss. Multiple borderline enlarged and minimally enlarged mediastinal and hilar lymph nodes are noted, measuring up to 11 mm in short axis in the right paratracheal nodal station. Esophagus is unremarkable in appearance. No axillary lymphadenopathy. Lungs/Pleura: Assessment for small pulmonary nodules is not possible secondary to extensive respiratory motion on today's examination. There are several small pulmonary nodules which are generally unchanged compared to prior examinations, however, the largest pulmonary nodule in the inferior aspect of the right upper lobe associated with the minor fissure (image 43 of series 6) has enlarged compared to prior studies and is more solid in appearance, currently measuring 1.0 x 1.3 cm (previously 12 x 9 mm on 09/02/2015, but 6 mm on 02/12/2014). There is again a background of patchy areas of ground-glass attenuation,  septal thickening and cylindrical/ varicose bronchiectasis throughout these lungs bilaterally, most evident in the lower lobes (particularly in the left lower lobe). No associated honeycombing confidently identified at this time. No acute consolidative airspace disease. No pleural effusions. Upper Abdomen: Unremarkable. Musculoskeletal/Soft Tissues: There are no aggressive appearing lytic or blastic lesions noted in the visualized portions of the skeleton. Multiple old healed right-sided rib fractures incidentally noted. Review of the MIP images confirms the above findings. IMPRESSION: 1. Limited study demonstrating no  central, lobar or proximal segmental sized pulmonary embolism. More distal segmental and subsegmental sized emboli cannot be excluded secondary to extensive patient respiratory motion. 2. Multiple small pulmonary nodules again noted, generally stable compared to prior studies with exception of mild enlargement of a 10 x 13 mm right upper lobe nodule, as discussed above. 3. Findings in the lungs again suggestive of underlying interstitial lung disease. 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 4. Atherosclerosis, including left main and 3 vessel coronary artery disease. Assessment for potential risk factor modification, dietary therapy or pharmacologic therapy may be warranted, if clinically indicated. 5. Status post left upper lobectomy. Electronically Signed   By: Vinnie Langton M.D.   On: 10/30/2015 18:32   Mr Brain Wo Contrast  10/22/2015  CLINICAL DATA:  Acute onset of left-sided weakness.  Stroke. EXAM: MRI HEAD WITHOUT CONTRAST TECHNIQUE: Multiplanar, multiecho pulse sequences of the brain and surrounding structures were obtained without intravenous contrast. COMPARISON:  Head CT same day.  MRI 09/19/2011. FINDINGS: The study does suffer from motion degradation. Diffusion imaging does not show any acute or subacute infarction. There is generalized brain atrophy. There chronic small-vessel changes affecting the pons. No cerebellar insult. The cerebral hemispheres show moderate changes of chronic small vessel disease within the deep and subcortical white matter. No large vessel territory infarction. No mass lesion, hemorrhage, hydrocephalus or extra-axial collection. No pituitary mass. No inflammatory sinus disease. No skull or skullbase lesion. Electronically Signed   By: Nelson Chimes M.D.   On: 10/22/2015 17:43   US Carotid Bilateral  11/05/2015  CLINICAL DATA:  Recent right femoral fracture and history  of TIAs EXAM: BILATERAL CAROTID DUPLEX ULTRASOUND TECHNIQUE: Pearline Cables scale imaging, color Doppler and duplex ultrasound were performed of bilateral carotid and vertebral arteries in the neck. COMPARISON:  02/06/2013 FINDINGS: Criteria: Quantification of carotid stenosis is based on velocity parameters that correlate the residual internal carotid diameter with NASCET-based stenosis levels, using the diameter of the distal internal carotid lumen as the denominator for stenosis measurement. The following velocity measurements were obtained: RIGHT ICA:  118/20 cm/sec CCA:  494/49 cm/sec SYSTOLIC ICA/CCA RATIO:  0.6 DIASTOLIC ICA/CCA RATIO:  0.8 ECA:  102 cm/sec LEFT ICA:  141/25 cm/sec CCA:  675/91 cm/sec SYSTOLIC ICA/CCA RATIO:  1.3 DIASTOLIC ICA/CCA RATIO:  1.5 ECA:  119 cm/sec RIGHT CAROTID ARTERY: Grayscale images demonstrate intimal thickening within the common carotid artery as well as plaque formation in the carotid bulb and extending into the proximal internal carotid artery. The waveforms, velocities and flow velocity ratios show no evidence of focal hemodynamically significant stenosis. RIGHT VERTEBRAL ARTERY:  Antegrade in nature. LEFT CAROTID ARTERY: Intimal thickening is noted within the common carotid artery on the left. Plaque formation in the carotid bulb and extending into the proximal internal carotid artery is noted. The waveforms, velocities and flow velocity ratios again demonstrate a stenosis in the 50-69% range likely in the lower end of that spectrum. LEFT  VERTEBRAL ARTERY:  Antegrade in nature. 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 No significant stenosis on the right is noted. Electronically Signed   By: Inez Catalina M.D.   On: 11/05/2015 11:50   Dg Chest Portable 1 View  11/04/2015  CLINICAL DATA:  Status post fall from wheelchair. Right shoulder pain. Personal history of lung cancer status post left upper lobectomy. Initial  encounter. EXAM: PORTABLE CHEST 1 VIEW COMPARISON:  Chest radiograph and CTA of the chest performed 10/30/2015 FINDINGS: The appearance of the lungs is stable from the prior study, reflecting diffuse interstitial lung disease with fibrotic change, worse at the left lung base. Known small pulmonary nodules are not well characterized on radiograph. No definite pleural effusion or pneumothorax is seen. The cardiomediastinal silhouette is borderline normal in size. No acute osseous abnormalities are seen. IMPRESSION: 1. Appearance of the lungs is stable from the prior study, reflecting diffuse interstitial lung disease with fibrotic change, worse at the left lung base. Known small pulmonary nodules are not well characterized on radiograph. 2. No displaced rib fracture seen. Electronically Signed   By: Garald Balding M.D.   On: 11/04/2015 23:50   Dg Chest Port 1 View  10/30/2015  CLINICAL DATA:  75 year old female with chest pain for 2 days. EXAM: PORTABLE CHEST 1 VIEW COMPARISON:  05/16/2015 and prior exams. FINDINGS: The cardiomediastinal silhouette is unchanged. Diffuse chronic interstitial opacities are again identified, possibly slightly increased from the prior study. There is no evidence pneumothorax or pleural effusion. Left lung volume loss again noted. No acute bony abnormalities identified. IMPRESSION: Interstitial opacities which may be slightly increased the prior study. This may represent acute infection/edema superimposed on chronic fibrosis. Electronically Signed   By: Margarette Canada M.D.   On: 10/30/2015 15:30   Dg Shoulder Right Port  11/04/2015  CLINICAL DATA:  Fall from wheelchair.  RIGHT shoulder pain EXAM: PORTABLE RIGHT SHOULDER - 2+ VIEW COMPARISON:  None. FINDINGS: Glenohumeral joint is intact. No evidence of scapular fracture or humeral fracture. The acromioclavicular joint is intact. IMPRESSION: No fracture or dislocation. Electronically Signed   By: Suzy Bouchard M.D.   On: 11/04/2015  23:49   Dg Knee Complete 4 Views Right  11/04/2015  CLINICAL DATA:  Fall from wheelchair at nursing home prior to arrival, now with right lower extremity pain. Right hip pain radiating to the knee. Initial encounter. EXAM: RIGHT KNEE - COMPLETE 4+ VIEW COMPARISON:  None. FINDINGS: Note fracture or dislocation. 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 Detailed evaluation partially obscured related to positioning. No large joint effusion. Vascular calcifications are seen. IMPRESSION: 1. No acute fracture or dislocation. 2. 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 Electronically Signed   By: Jeb Levering M.D.   On: 11/04/2015 19:19   Dg Hip Unilat With Pelvis 2-3 Views Right  11/04/2015  CLINICAL DATA:  Pt fell from wheelchair at nursing home prior to arrival to Radiology. Severe pain in Rt hip with radiation into Rt knee. EXAM: DG HIP (WITH OR WITHOUT PELVIS) 2-3V RIGHT COMPARISON:  None. FINDINGS: Intertrochanteric fracture of the proximal RIGHT femur with varus angulation. There is superior migration of the distal fracture fragment. No dislocation. IMPRESSION: RIGHT intertrochanteric femur fracture. Electronically Signed   By: Suzy Bouchard M.D.   On: 11/04/2015 19:20    A/P:  Patient s/p fall 2 days ago with deformity of right leg and significant pan. Recent stroke with residual  neuro deficits Patient limited ambulation baseline.   [facility] from [facility] for medical management of new cardiac issues Right hip fracture noted Discussed case with Dr Lyla Glassing  Plan on CT scan of right hip to better evaluate fracture pattern Once cleared by cardiology plan on definitive fracture management

## 2015-11-06 NOTE — Progress Notes (Signed)
Spoke with Dr. Conley Canal regarding pts constant moaning and crying out. Not sure if pt is agitated or in pain. PRN ativan and dilaudid given to pt. Family at bedside and concerned about pts pain agitation.

## 2015-11-06 NOTE — Progress Notes (Signed)
Asked to assume care by Dr. Rolena Infante. Very complicated situation. 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 Has been nonambulatory for about 9 months. Does stand to transfer. Uses legs to propel herself with WC in nursing home. [facility] out of WC 2 days ago and found to have comminuted R IT femur fx. Had lengthy discussion with daughter regarding treatment options, nonoperative vs operative. I think she would benefit from surgical stabilization, as the benefits outweigh the potential risks. Plan for surgery tomorrow am.   The risks, benefits, and alternatives were discussed with the patient / daughter. There are risks associated with the surgery including, but not limited to, problems with anesthesia (death), infection, differences in leg length/angulation/rotation, fracture of bones, loosening or failure of implants, malunion, nonunion, hematoma (blood accumulation) which may require surgical drainage, blood clots, pulmonary embolism, nerve injury (foot drop), and blood vessel injury. The patient / daughter understands these risks and elects to proceed.

## 2015-11-06 NOTE — Consult Note (Signed)
Reason for Consult: Atrial flutter with 2 to one conduction Referring Physician: [facility]  Stephanie [name] is an 75 y.o. female.  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.hospital subsequent A developed A. fib flutter with RVR and was transferred to [facility] for further management. No meaningful history could be opted from the patient as she is moaning and in pain. Patient spontaneously converted back to sinus rhythm before starting IV amiodarone. EKG showed no acute ischemic changes. Patient had 2-D echo done which showed vigorous LV systolic function with significant LVH and Sam with severe mitral regurgitation and moderate tricuspid regurgitation.  Past Medical History  Diagnosis Date  . CVA (cerebral infarction)     speech difficulities  . Stroke ([place])   . Hypercholesteremia     Takes Zocor daily  . Hypertension     takes Atenolol and Lisinopril daily  . Lung mass     left upper lobe  . Arthritis     hands  . TIA (transient ischemic attack)     Sat before thanksgiving;impaired speech  . Pneumothorax   . Lung cancer ([place])   . CKD (chronic kidney disease), stage III     Past Surgical History  Procedure Laterality Date  . No past surgeries    . Lung biopsy  10/13/11    left  . Abdominal hysterectomy  1975  . Varicose vein surgery  86yr ago  . Video assisted thoracoscopy   11/18/2011    Procedure: VIDEO ASSISTED THORACOSCOPY;  Surgeon: SMelrose Nakayama MD;  [facility]  Service: Thoracic;  Laterality: Left;  . Lobectomy  11/18/2011    Procedure: LOBECTOMY;  Surgeon: SMelrose Nakayama MD;  [facility] [facility]  Service: Thoracic;  Laterality: Left;  LEFT UPPER LOBECTOMY  . Breast biopsy  early 2000    Family History  Problem Relation Age of Onset  . Stroke Mother   . Stroke Sister   . Anesthesia problems Neg Hx   . Hypotension Neg Hx   . Malignant hyperthermia Neg Hx   . Pseudochol deficiency Neg Hx   . Diabetes Brother   . Cancer Brother   . Stroke Brother     Social History:  reports that she has never smoked. She has never used smokeless tobacco. She reports that she does not drink alcohol or use illicit drugs.  Allergies: No Known Allergies  Medications: I have reviewed the patient's current medications.  Results for orders placed or performed during the hospital encounter of 11/04/15 (from the past 48 hour(s))  CBC with Differential/Platelet     Status: Abnormal   Collection Time: 11/04/15  8:00 PM  Result Value Ref Range   WBC 14.2 (H) 4.0 - 10.5 K/uL   RBC 3.68 (L) 3.87 - 5.11 MIL/uL   Hemoglobin 12.1 12.0 - 15.0 g/dL   HCT 35.9 (L) 36.0 - 46.0 %   MCV 97.6 78.0 - 100.0 fL   MCH 32.9 26.0 - 34.0 pg   MCHC 33.7 30.0 - 36.0  g/dL   RDW 12.4 11.5 - 15.5 %   Platelets 224 150 - 400 K/uL   Neutrophils Relative % 87 %   Neutro Abs 12.3 (H) 1.7 - 7.7 K/uL   Lymphocytes Relative 7 %   Lymphs Abs 1.0 0.7 - 4.0 K/uL   Monocytes Relative 5 %   Monocytes Absolute 0.7 0.1 - 1.0 K/uL   Eosinophils Relative 1 %   Eosinophils Absolute 0.2 0.0 - 0.7 K/uL   Basophils Relative 0 %   Basophils Absolute 0.0 0.0 - 0.1 K/uL  Comprehensive metabolic panel     Status: Abnormal   Collection Time: 11/04/15  8:00 PM  Result Value Ref Range   Sodium 139 135 - 145 mmol/L   Potassium 4.4 3.5 - 5.1 mmol/L   Chloride 106 101 - 111 mmol/L   CO2 24 22  - 32 mmol/L   Glucose, Bld 182 (H) 65 - 99 mg/dL   BUN 19 6 - 20 mg/dL   Creatinine, Ser 1.38 (H) 0.44 - 1.00 mg/dL   Calcium 9.0 8.9 - 10.3 mg/dL   Total Protein 7.5 6.5 - 8.1 g/dL   Albumin 3.5 3.5 - 5.0 g/dL   AST 27 15 - 41 U/L   ALT 19 14 - 54 U/L   Alkaline Phosphatase 96 38 - 126 U/L   Total Bilirubin 0.8 0.3 - 1.2 mg/dL   GFR calc non Af Amer 37 (L) >60 mL/min   GFR calc Af Amer 43 (L) >60 mL/min    Comment: (NOTE) The eGFR has been calculated using the CKD EPI equation. This calculation has not been validated in all clinical situations. eGFR's persistently <60 mL/min signify possible Chronic Kidney Disease.    Anion gap 9 5 - 15  Type and screen     Status: None (Preliminary result)   Collection Time: 11/04/15  8:00 PM  Result Value Ref Range   ABO/RH(D) O POS    Antibody Screen NEG    Sample Expiration 11/07/2015    Unit Number [id number]    Blood Component Type RED CELLS,LR    Unit division 00    Status of Unit ALLOCATED    Transfusion Status OK TO TRANSFUSE    Crossmatch Result Compatible    Unit Number [id number]    Blood Component Type RED CELLS,LR    Unit division 00    Status of Unit ALLOCATED    Transfusion Status OK TO TRANSFUSE    Crossmatch Result Compatible   Prepare RBC     Status: None   Collection Time: 11/04/15  8:00 PM  Result Value Ref Range   Order Confirmation ORDER PROCESSED BY BLOOD BANK   Protime-INR     Status: Abnormal   Collection Time: 11/04/15  8:00 PM  Result Value Ref Range   Prothrombin Time 15.6 (H) 11.6 - 15.2 seconds   INR 1.22 0.00 - 1.49  ABO/Rh     Status: None   Collection Time: 11/04/15  8:00 PM  Result Value Ref Range   ABO/RH(D) O POS   TSH     Status: Abnormal   Collection Time: 11/04/15  8:00 PM  Result Value Ref Range   TSH 6.114 (H) 0.350 - 4.500 uIU/mL  Urinalysis, Routine w reflex microscopic (not at [facility])     Status: Abnormal   Collection Time: 11/05/15 12:22 AM  Result Value Ref Range   Color,  Urine YELLOW YELLOW   APPearance CLEAR CLEAR   Specific Gravity, Urine 1.025 1.005 -  1.030   pH 5.5 5.0 - 8.0   Glucose, UA NEGATIVE NEGATIVE mg/dL   Hgb urine dipstick TRACE (A) NEGATIVE   Bilirubin Urine NEGATIVE NEGATIVE   Ketones, ur NEGATIVE NEGATIVE mg/dL   Protein, ur 30 (A) NEGATIVE mg/dL   Nitrite NEGATIVE NEGATIVE   Leukocytes, UA NEGATIVE NEGATIVE  Urine microscopic-add on     Status: Abnormal   Collection Time: 11/05/15 12:22 AM  Result Value Ref Range   Squamous Epithelial / LPF 0-5 (A) NONE SEEN   WBC, UA 0-5 0 - 5 WBC/hpf   RBC / HPF 0-5 0 - 5 RBC/hpf   Bacteria, UA MANY (A) NONE SEEN    US Carotid Bilateral  11/05/2015  CLINICAL DATA:  Recent right femoral fracture and history of TIAs EXAM: BILATERAL CAROTID DUPLEX ULTRASOUND TECHNIQUE: Pearline Cables scale imaging, color Doppler and duplex ultrasound were performed of bilateral carotid and vertebral arteries in the neck. COMPARISON:  02/06/2013 FINDINGS: Criteria: Quantification of carotid stenosis is based on velocity parameters that correlate the residual internal carotid diameter with NASCET-based stenosis levels, using the diameter of the distal internal carotid lumen as the denominator for stenosis measurement. The following velocity measurements were obtained: RIGHT ICA:  118/20 cm/sec CCA:  673/41 cm/sec SYSTOLIC ICA/CCA RATIO:  0.6 DIASTOLIC ICA/CCA RATIO:  0.8 ECA:  102 cm/sec LEFT ICA:  141/25 cm/sec CCA:  937/90 cm/sec SYSTOLIC ICA/CCA RATIO:  1.3 DIASTOLIC ICA/CCA RATIO:  1.5 ECA:  119 cm/sec RIGHT CAROTID ARTERY: Grayscale images demonstrate intimal thickening within the common carotid artery as well as plaque formation in the carotid bulb and extending into the proximal internal carotid artery. The waveforms, velocities and flow velocity ratios show no evidence of focal hemodynamically significant stenosis. RIGHT VERTEBRAL ARTERY:  Antegrade in nature. LEFT CAROTID ARTERY: Intimal thickening is noted within the common carotid  artery on the left. Plaque formation in the carotid bulb and extending into the proximal internal carotid artery is noted. The waveforms, velocities and flow velocity ratios again demonstrate a stenosis in the 50-69% range likely in the lower end of that spectrum. LEFT VERTEBRAL ARTERY:  Antegrade in nature. 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 No significant stenosis on the right is noted. Electronically Signed   By: Inez Catalina M.D.   On: 11/05/2015 11:50   Dg Chest Portable 1 View  11/04/2015  CLINICAL DATA:  Status post fall from wheelchair. Right shoulder pain. Personal history of lung cancer status post left upper lobectomy. Initial encounter. EXAM: PORTABLE CHEST 1 VIEW COMPARISON:  Chest radiograph and CTA of the chest performed 10/30/2015 FINDINGS: The appearance of the lungs is stable from the prior study, reflecting diffuse interstitial lung disease with fibrotic change, worse at the left lung base. Known small pulmonary nodules are not well characterized on radiograph. No definite pleural effusion or pneumothorax is seen. The cardiomediastinal silhouette is borderline normal in size. No acute osseous abnormalities are seen. IMPRESSION: 1. Appearance of the lungs is stable from the prior study, reflecting diffuse interstitial lung disease with fibrotic change, worse at the left lung base. Known small pulmonary nodules are not well characterized on radiograph. 2. No displaced rib fracture seen. Electronically Signed   By: Garald Balding M.D.   On: 11/04/2015 23:50   Dg Shoulder Right Port  11/04/2015  CLINICAL DATA:  Fall from wheelchair.  RIGHT shoulder pain EXAM: PORTABLE RIGHT SHOULDER - 2+ VIEW COMPARISON:  None. FINDINGS: Glenohumeral joint is intact.  No evidence of scapular fracture or humeral fracture. The acromioclavicular joint is intact. IMPRESSION: No fracture or dislocation. Electronically Signed   By: Suzy Bouchard M.D.    On: 11/04/2015 23:49   Dg Knee Complete 4 Views Right  11/04/2015  CLINICAL DATA:  Fall from wheelchair at nursing home prior to arrival, now with right lower extremity pain. Right hip pain radiating to the knee. Initial encounter. EXAM: RIGHT KNEE - COMPLETE 4+ VIEW COMPARISON:  None. FINDINGS: Note fracture or dislocation. 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 Detailed evaluation partially obscured related to positioning. No large joint effusion. Vascular calcifications are seen. IMPRESSION: 1. No acute fracture or dislocation. 2. 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 Electronically Signed   By: Jeb Levering M.D.   On: 11/04/2015 19:19   Dg Hip Unilat With Pelvis 2-3 Views Right  11/04/2015  CLINICAL DATA:  Pt fell from wheelchair at nursing home prior to arrival to Radiology. Severe pain in Rt hip with radiation into Rt knee. EXAM: DG HIP (WITH OR WITHOUT PELVIS) 2-3V RIGHT COMPARISON:  None. FINDINGS: Intertrochanteric fracture of the proximal RIGHT femur with varus angulation. There is superior migration of the distal fracture fragment. No dislocation. IMPRESSION: RIGHT intertrochanteric femur fracture. Electronically Signed   By: Suzy Bouchard M.D.   On: 11/04/2015 19:20    Review of Systems  Unable to perform ROS: acuity of condition   Blood pressure 94/62, pulse 79, temperature 98.2 F (36.8 C), temperature source Axillary, resp. rate 21, height 5' 5"  (1.651 m), weight 154 lb 5.2 oz (70 kg), SpO2 95 %. Physical Exam  Eyes: Conjunctivae are normal. Right eye exhibits no discharge. Left eye exhibits no discharge.  Neck: Neck supple. No JVD present. No tracheal deviation present. No thyromegaly present.  Cardiovascular: Normal rate and regular rhythm.   Murmur (2/6 systolic murmur noted) heard. Respiratory: Effort normal and breath sounds normal. No respiratory distress. She has no wheezes.  GI: Soft.  Bowel sounds are normal. She exhibits no distension.  Musculoskeletal: She exhibits no edema or tenderness.    Assessment/Plan: Right hip fracture status post fall Status post paroxysmal A. fib flutter with 2-1 block converted to sinus rhythm chadsvac score of 8 hypertrophic cardiomyopathy with SAM Severe mitral regurgitation and moderate tricuspid regurgitation Hypertension History of CVA in the past Hyperlipidemia Bilateral carotid artery disease Adenocarcinoma of the left lung Plan IV fluid bolus to250 mL normal saline and then 100 mL per hour Switch amiodarone to by mouth as per orders Patient will need chronic anticoagulation in view A. fib flutter and multiple risk factors for recurrent CVA. Add low-dose beta blockers as blood pressure tolerates. Will try to keep heart rate in 60s which will reduce the outflow tract obstruction.  Charolette Forward 11/06/2015, 10:41 AM

## 2015-11-06 NOTE — Progress Notes (Signed)
MD made aware of pts BP of 77/59.

## 2015-11-06 NOTE — Progress Notes (Signed)
[facility] PROGRESS NOTE   Stephanie [name] WJX:[id number] DOB: 08/19/1941 DOA: 11/04/2015 PCP: [name], MD  Summary 75 year old aphasic white female from skilled nursing facility who fell out of wheelchair sustained a right hip fracture and was admitted to [facility] on 1/4. She was also noted to have new A. fib with RVR. She was started on a Cardizem drip and briefly became hypotensive. Drip was subsequently stopped. Dr. Aline Brochure, orthopedics was consulted and felt that patient needed transfer to a higher level of care for anesthesia and cardiology coverage.  Hospitalist at [facility] felt she was medically optimized, and might not be a long-term anticoagulation candidate. Patient arrived at [facility] step down late last night. She has no previous history of fracture nor cardiac issues. At baseline, she is able to maneuver fairly well in a wheelchair at skilled nursing facility.  Assessment/Plan: Principal Problem:   Hip fracture, right (HCC) Active Problems:   Adenocarcinoma of left lung, stage 1 (HCC)   Bilateral carotid artery disease (HCC)   Hyperlipidemia   CKD (chronic kidney disease)   Hip fracture (HCC)   Closed right hip fracture (HCC)   Atrial fibrillation (HCC)   Right hip Fracture -Present with right hip fracture secondary to a fall. -Evaluated by Dr. Aline Brochure of orthopedics and eventually she needs ORIF. -Because of comorbidities, lack of cardiology and anesthesiology during the weekend he recommended transfer to [facility]. Discussed with Dr. Rolena Infante who will evaluate the patient and discuss with Dr. Delfino Lovett  New onset of paroxysmal A. fib Echocardiogram pending. Consult to Dr. Terrence Dupont who recommends amiodarone drip. Heart rate currently below 100 so hold off on digoxin for now. CHADSVASC 5, but may not be a good candidate for chronic anticoagulation due to fall risk.  TIA/stroke -Has history of stroke in 2012, recent history of TIA in December 2016. -Patient  is on Plavix, held for the upcoming surgery  Acute renal failure on CKD stage III. -Patient has CKD stage III baseline creatinine 1.1, presented with creatinine 1.3. -Started on IV fluids, follow closely, avoid fluid overload  History of lung cancer stage I History of lung cancer diagnosed January 2013 with surgical resection, was T2a, pN0. Continue Iressa, per last nephrology note question of progression of adenocarcinoma but not proven by biopsy.  HLD -Continue with statin.  HTN -She has hypotension in the ER, so we will hold her anti HTN meds.  Code Status: DNR Family Communication: Multiple family members at bedside Disposition Plan: Back to skilled nursing facility after surgery. Diet: DIET - DYS 1 Room service appropriate?: Yes; Fluid consistency:: Thin  Consultants:  Ortho  Procedures:  None  Antibiotics:  None   Objective: Filed Vitals:   11/06/15 0026 11/06/15 0446  BP: 97/53 94/62  Pulse: 85 79  Temp: 98.3 F (36.8 C) 98.2 F (36.8 C)  Resp: 22 21    Intake/Output Summary (Last 24 hours) at 11/06/15 1000 Last data filed at 11/06/15 0400  Gross per 24 hour  Intake   1210 ml  Output    575 ml  Net    635 ml   Filed Weights   11/05/15 0110 11/05/15 0500 11/05/15 2224  Weight: 65.7 kg (144 lb 13.5 oz) 65.7 kg (144 lb 13.5 oz) 70 kg (154 lb 5.2 oz)    telemetry: Paroxysmal Atrial fibrillation rate 90-120  Exam: General: Alert moaning. 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 No results for input(s): AMMONIA in the last 168 hours. CBC:  Recent Labs Lab 10/30/15 1435 11/03/15 1113 11/04/15 2000  WBC 7.2 6.1 14.2*  NEUTROABS 4.8 4.3 12.3*  HGB 12.5 12.6 12.1  HCT 37.8 38.2 35.9*  MCV 99.7 99.7 97.6  PLT 208 205 224   Cardiac Enzymes:  Recent Labs Lab 10/30/15 1435  TROPONINI <0.03   BNP (last 3 results) No results for input(s): BNP in the last 8760 hours.  ProBNP (last 3 results) No results for input(s): PROBNP in the last 8760 hours.  CBG: No results for input(s): GLUCAP in the last 168 hours.  Micro No results found for this or any previous visit (from the past 240 hour(s)).   Studies: US Carotid Bilateral  11/05/2015  CLINICAL DATA:  Recent right femoral fracture and history of TIAs EXAM: BILATERAL CAROTID DUPLEX ULTRASOUND TECHNIQUE: Pearline Cables scale imaging, color Doppler and duplex ultrasound were performed of bilateral carotid and vertebral arteries in the neck. COMPARISON:  02/06/2013 FINDINGS: Criteria: Quantification of carotid stenosis is based on velocity parameters that correlate the residual internal carotid diameter with NASCET-based stenosis levels, using the diameter of the distal internal carotid lumen as the denominator for stenosis measurement. The following velocity measurements were obtained: RIGHT ICA:  118/20 cm/sec CCA:  290/21 cm/sec SYSTOLIC ICA/CCA RATIO:  0.6 DIASTOLIC ICA/CCA RATIO:  0.8 ECA:  102 cm/sec LEFT ICA:  141/25 cm/sec CCA:  115/52 cm/sec SYSTOLIC ICA/CCA RATIO:  1.3 DIASTOLIC ICA/CCA RATIO:  1.5 ECA:  119 cm/sec RIGHT CAROTID ARTERY: Grayscale images  demonstrate intimal thickening within the common carotid artery as well as plaque formation in the carotid bulb and extending into the proximal internal carotid artery. The waveforms, velocities and flow velocity ratios show no evidence of focal hemodynamically significant stenosis. RIGHT VERTEBRAL ARTERY:  Antegrade in nature. LEFT CAROTID ARTERY: Intimal thickening is noted within the common carotid artery on the left. Plaque formation in the carotid bulb and extending into the proximal internal carotid artery is noted. The waveforms, velocities and flow velocity ratios again demonstrate a stenosis in the 50-69% range likely in the lower end of that spectrum. LEFT VERTEBRAL ARTERY:  Antegrade in nature. 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 No significant stenosis on the right is noted. Electronically Signed   By: Inez Catalina M.D.   On: 11/05/2015 11:50   Dg Chest Portable 1 View  11/04/2015  CLINICAL DATA:  Status post fall from wheelchair. Right shoulder pain. Personal history of lung cancer status post left upper lobectomy. Initial encounter. EXAM: PORTABLE CHEST 1 VIEW COMPARISON:  Chest radiograph and CTA of the chest  performed 10/30/2015 FINDINGS: The appearance of the lungs is stable from the prior study, reflecting diffuse interstitial lung disease with fibrotic change, worse at the left lung base. Known small pulmonary nodules are not well characterized on radiograph. No definite pleural effusion or pneumothorax is seen. The cardiomediastinal silhouette is borderline normal in size. No acute osseous abnormalities are seen. IMPRESSION: 1. Appearance of the lungs is stable from the prior study, reflecting diffuse interstitial lung disease with fibrotic change, worse at the left lung base. Known small pulmonary nodules are not well characterized on radiograph. 2. No displaced rib fracture seen. Electronically Signed   By: Garald Balding M.D.    On: 11/04/2015 23:50   Dg Shoulder Right Port  11/04/2015  CLINICAL DATA:  Fall from wheelchair.  RIGHT shoulder pain EXAM: PORTABLE RIGHT SHOULDER - 2+ VIEW COMPARISON:  None. FINDINGS: Glenohumeral joint is intact. No evidence of scapular fracture or humeral fracture. The acromioclavicular joint is intact. IMPRESSION: No fracture or dislocation. Electronically Signed   By: Suzy Bouchard M.D.   On: 11/04/2015 23:49   Dg Knee Complete 4 Views Right  11/04/2015  CLINICAL DATA:  Fall from wheelchair at nursing home prior to arrival, now with right lower extremity pain. Right hip pain radiating to the knee. Initial encounter. EXAM: RIGHT KNEE - COMPLETE 4+ VIEW COMPARISON:  None. FINDINGS: Note fracture or dislocation. 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 Detailed evaluation partially obscured related to positioning. No large joint effusion. Vascular calcifications are seen. IMPRESSION: 1. No acute fracture or dislocation. 2. 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 Electronically Signed   By: Jeb Levering M.D.   On: 11/04/2015 19:19   Dg Hip Unilat With Pelvis 2-3 Views Right  11/04/2015  CLINICAL DATA:  Pt fell from wheelchair at nursing home prior to arrival to Radiology. Severe pain in Rt hip with radiation into Rt knee. EXAM: DG HIP (WITH OR WITHOUT PELVIS) 2-3V RIGHT COMPARISON:  None. FINDINGS: Intertrochanteric fracture of the proximal RIGHT femur with varus angulation. There is superior migration of the distal fracture fragment. No dislocation. IMPRESSION: RIGHT intertrochanteric femur fracture. Electronically Signed   By: Suzy Bouchard M.D.   On: 11/04/2015 19:20    Scheduled Meds: . sodium chloride   Intravenous Once  . antiseptic oral rinse  7 mL Mouth Rinse BID  . aspirin EC  81 mg Oral q morning - 10a  . diltiazem  30 mg Oral 4 times per day  . DULoxetine  60 mg Oral Daily  . enoxaparin (LOVENOX)  injection  40 mg Subcutaneous Q24H  . lamoTRIgine  25 mg Oral Daily  . pantoprazole  40 mg Oral Daily  . simvastatin  20 mg Oral QPM  . sodium chloride  3 mL Intravenous Q12H   Continuous Infusions: . sodium chloride 50 mL/hr at 11/06/15 0026   Time spent: 35 minutes  [facility] [email or web address], password [email or web address] 11/06/2015, 10:00 AM  LOS: 2 days

## 2015-11-06 NOTE — Progress Notes (Signed)
Pt very upset, pt is unable to voice if she is in pain or why she is upset. Family knows she has confusion but thinks it is mainly pain, pt getting really worked up and will barely swallow PO meds for me. Will keep MD updated. Dilaudid given for pts pain.

## 2015-11-07 ENCOUNTER — Encounter (HOSPITAL_COMMUNITY): Payer: Self-pay | Admitting: Certified Registered Nurse Anesthetist

## 2015-11-07 DIAGNOSIS — S32599A Other specified fracture of unspecified pubis, initial encounter for closed fracture: Secondary | ICD-10-CM | POA: Diagnosis present

## 2015-11-07 LAB — TYPE AND SCREEN
ABO/RH(D): O POS
Antibody Screen: NEGATIVE
Unit division: 0
Unit division: 0

## 2015-11-07 LAB — BASIC METABOLIC PANEL
ANION GAP: 6 (ref 5–15)
Anion gap: 4 — ABNORMAL LOW (ref 5–15)
BUN: 28 mg/dL — ABNORMAL HIGH (ref 6–20)
BUN: 27 mg/dL — AB (ref 6–20)
CALCIUM: 8 mg/dL — AB (ref 8.9–10.3)
CALCIUM: 8.1 mg/dL — AB (ref 8.9–10.3)
CHLORIDE: 113 mmol/L — AB (ref 101–111)
CO2: 20 mmol/L — AB (ref 22–32)
CO2: 23 mmol/L (ref 22–32)
CREATININE: 1.73 mg/dL — AB (ref 0.44–1.00)
CREATININE: 1.64 mg/dL — AB (ref 0.44–1.00)
Chloride: 112 mmol/L — ABNORMAL HIGH (ref 101–111)
GFR calc Af Amer: 33 mL/min — ABNORMAL LOW (ref >60)
GFR calc Af Amer: 35 mL/min — ABNORMAL LOW (ref >60)
GFR calc non Af Amer: 30 mL/min — ABNORMAL LOW (ref >60)
GFR, EST NON AFRICAN AMERICAN: 28 mL/min — AB (ref >60)
GLUCOSE: 118 mg/dL — AB (ref 65–99)
GLUCOSE: 124 mg/dL — AB (ref 65–99)
Potassium: 4.3 mmol/L (ref 3.5–5.1)
Potassium: 4.6 mmol/L (ref 3.5–5.1)
Sodium: 138 mmol/L (ref 135–145)
Sodium: 140 mmol/L (ref 135–145)

## 2015-11-07 LAB — T4, FREE: Free T4: 0.91 ng/dL (ref 0.61–1.12)

## 2015-11-07 MED ORDER — PHENYLEPHRINE 40 MCG/ML (10ML) SYRINGE FOR IV PUSH (FOR BLOOD PRESSURE SUPPORT)
PREFILLED_SYRINGE | INTRAVENOUS | Status: AC
  Filled 2015-11-07: qty 10

## 2015-11-07 MED ORDER — HYDROMORPHONE HCL 1 MG/ML IJ SOLN
0.5000 mg | INTRAMUSCULAR | Status: DC | PRN
  Administered 2015-11-07 (×2): 1 mg via INTRAVENOUS
  Administered 2015-11-07 (×2): 0.5 mg via INTRAVENOUS
  Administered 2015-11-07: 1 mg via INTRAVENOUS
  Administered 2015-11-08: 0.5 mg via INTRAVENOUS
  Administered 2015-11-08: 1 mg via INTRAVENOUS
  Filled 2015-11-07 (×6): qty 1

## 2015-11-07 MED ORDER — SUCCINYLCHOLINE CHLORIDE 20 MG/ML IJ SOLN
INTRAMUSCULAR | Status: AC
  Filled 2015-11-07: qty 1

## 2015-11-07 MED ORDER — FENTANYL CITRATE (PF) 250 MCG/5ML IJ SOLN
INTRAMUSCULAR | Status: AC
  Filled 2015-11-07: qty 5

## 2015-11-07 MED ORDER — LIDOCAINE HCL (CARDIAC) 20 MG/ML IV SOLN
INTRAVENOUS | Status: AC
  Filled 2015-11-07: qty 5

## 2015-11-07 MED ORDER — ROCURONIUM BROMIDE 50 MG/5ML IV SOLN
INTRAVENOUS | Status: AC
  Filled 2015-11-07: qty 1

## 2015-11-07 MED ORDER — EPHEDRINE SULFATE 50 MG/ML IJ SOLN
INTRAMUSCULAR | Status: AC
  Filled 2015-11-07: qty 1

## 2015-11-07 MED ORDER — PROPOFOL 10 MG/ML IV BOLUS
INTRAVENOUS | Status: AC
  Filled 2015-11-07: qty 40

## 2015-11-07 MED ORDER — SODIUM CHLORIDE 0.9 % IV BOLUS (SEPSIS)
250.0000 mL | Freq: Once | INTRAVENOUS | Status: AC
  Administered 2015-11-07: 250 mL via INTRAVENOUS

## 2015-11-07 MED ORDER — SODIUM CHLORIDE 0.9 % IJ SOLN
INTRAMUSCULAR | Status: AC
  Filled 2015-11-07: qty 10

## 2015-11-07 MED ORDER — SODIUM CHLORIDE 0.9 % IV BOLUS (SEPSIS)
500.0000 mL | Freq: Once | INTRAVENOUS | Status: AC
  Administered 2015-11-07: 500 mL via INTRAVENOUS

## 2015-11-07 NOTE — Progress Notes (Signed)
CRNA at bedside.

## 2015-11-07 NOTE — Progress Notes (Signed)
Patient transported back to 3300. Report given regarding reason for surgery delay. Jake Shark, RN at bedside prior to RN leaving patient bedside.

## 2015-11-07 NOTE — Anesthesia Preprocedure Evaluation (Addendum)
Anesthesia Evaluation  Patient identified by MRN, date of birth, ID band Patient confused    Reviewed: Allergy & Precautions, NPO status , Patient's Chart, lab work & pertinent test results  Airway Mallampati: II   Neck ROM: full    Dental  (+) Dental Advisory Given   Pulmonary    breath sounds clear to auscultation       Cardiovascular hypertension, Pt. on medications and Pt. on home beta blockers + Peripheral Vascular Disease  + dysrhythmias Atrial Fibrillation  Rhythm:regular Rate:Normal  ECHO (11/05/15) - Left ventricle: findings consistant with HOCM> Marked SAM LVOT gradient not well characterized by doppler but significant. Also  severe MR Consider volume loading to help with serverely abnormal dynamic physiology. The cavity size was normal. Wall thickness was increased in a pattern of moderate LVH. Systolic function was vigorous. The estimated ejection fraction was in the range of 65% to 70%. - Mitral valve: There was severe regurgitation. - Left atrium: The atrium was moderately dilated. - Right atrium: The atrium was mildly dilated. - Atrial septum: No defect or patent foramen ovale was identified. - Tricuspid valve: There was moderate regurgitation. - Pericardium, extracardiac: A trivial pericardial effusion was identified.    Neuro/Psych TIACVA (Speech impairment), Residual Symptoms    GI/Hepatic   Endo/Other    Renal/GU Renal InsufficiencyRenal disease     Musculoskeletal  (+) Arthritis ,   Abdominal   Peds  Hematology   Anesthesia Other Findings   Reproductive/Obstetrics                         Anesthesia Physical Anesthesia Plan  ASA: III  Anesthesia Plan: General   Post-op Pain Management:    Induction: Intravenous  Airway Management Planned: Oral ETT  Additional Equipment:   Intra-op Plan:   Post-operative Plan: Extubation in OR  Informed Consent: I have reviewed  the patients History and Physical, chart, labs and discussed the procedure including the risks, benefits and alternatives for the proposed anesthesia with the patient or authorized representative who has indicated his/her understanding and acceptance.     Plan Discussed with: CRNA, Anesthesiologist and Surgeon  Anesthesia Plan Comments:         Anesthesia Quick Evaluation

## 2015-11-07 NOTE — Progress Notes (Signed)
[facility] PROGRESS NOTE   DAWANDA MAPEL LMB:[id number] DOB: 1941-06-13 DOA: 11/04/2015 PCP: [name], MD  Summary 75 year old aphasic white female from skilled nursing facility who fell out of wheelchair sustained a right hip fracture and was admitted to [facility] on 1/4. She was also noted to have new A. fib with RVR. She was started on a Cardizem drip and briefly became hypotensive. Drip was subsequently stopped. Dr. Aline Brochure, orthopedics was consulted and felt that patient needed transfer to a higher level of care for anesthesia and cardiology coverage.  Hospitalist at [facility] felt she was medically optimized. She has no previous history of fracture nor cardiac issues. At baseline, she is able to maneuver fairly well in a wheelchair at skilled nursing facility.  Assessment/Plan: Principal Problem:   Hip fracture, right (HCC) Active Problems:   Adenocarcinoma of left lung, stage 1 (HCC)   Bilateral carotid artery disease (HCC)   Hyperlipidemia   CKD (chronic kidney disease)   Hip fracture (HCC)   Closed right hip fracture (HCC)   Atrial fibrillation (HCC)   Right hip Fracture Surgery tomorrow if creatinine stable  Right sacral fracture/Right superior and inferior pubic rami fx seen on CT  New onset of paroxysmal A. 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 Dr. Terrence Dupont transitioned to PO amiodarone. CHADSVASC 5. Eventual anticoagulation  Severe MR  HOCM  TIA/stroke -Has history of stroke in 2012, recent history of TIA in December 2016. -Patient is on Plavix, held for the upcoming surgery Aphasic since 2/12  Acute renal failure on CKD stage III. Ranges 1.1-1.4. Creatinine up today, likely from hemodynamic factors (hypotension periodically on admission and yesterday. Continue hydration. Blood pressure has been good overnight  History of lung cancer stage I History of lung cancer diagnosed January 2013 with surgical resection, was T2a,  pN0. Continue Iressa, per last nephrology note question of progression of adenocarcinoma but not proven by biopsy.  HLD -Continue with statin.  HTN  Code Status: DNR Family Communication: granddaughter at bedside Disposition Plan: Back to skilled nursing facility after surgery. Diet: D1  Consultants:  Ortho  cardiology  Procedures:  None  Antibiotics:  None  Subjective: Unable.   Objective: Filed Vitals:   11/07/15 0715 11/07/15 0716  BP: 124/49   Pulse: 76 77  Temp:  97.3 F (36.3 C)  Resp: 16 16    Intake/Output Summary (Last 24 hours) at 11/07/15 0943 Last data filed at 11/07/15 0830  Gross per 24 hour  Intake 2629.17 ml  Output   1375 ml  Net 1254.17 ml   Filed Weights   11/05/15 0110 11/05/15 0500 11/05/15 2224  Weight: 65.7 kg (144 lb 13.5 oz) 65.7 kg (144 lb 13.5 oz) 70 kg (154 lb 5.2 oz)    telemetry: NSR  Exam: General: asleep. 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 No results for input(s): AMMONIA in the last 168 hours. CBC:  Recent Labs Lab 11/03/15 1113 11/04/15 2000  WBC 6.1 14.2*  NEUTROABS 4.3 12.3*  HGB 12.6 12.1  HCT 38.2 35.9*  MCV 99.7 97.6  PLT  205 224   Cardiac Enzymes: No results for input(s): CKTOTAL, CKMB, CKMBINDEX, TROPONINI in the last 168 hours. BNP (last 3 results) No results for input(s): BNP in the last 8760 hours.  ProBNP (last 3 results) No results for input(s): PROBNP in the last 8760 hours.  CBG: No results for input(s): GLUCAP in the last 168 hours.  Micro No results found for this or any previous visit (from the past 240 hour(s)).   Studies: Ct Hip Right Wo Contrast  11/06/2015  CLINICAL DATA:  [place] 2 days ago.  Deformity of the right leg. EXAM: CT OF THE RIGHT HIP WITHOUT CONTRAST TECHNIQUE: Multidetector CT imaging of the right hip was performed according to the standard protocol. Multiplanar CT image reconstructions were also generated. COMPARISON:  Radiographs 11/03/2005 FINDINGS: There is a severely displaced complex right hip fracture. This is an intertrochanteric fracture with extension into the base of the cervical neck. The greater and lesser trochanter fractures are displaced and comminuted. The femoral head is normally located. The acetabulum is intact. There are superior and inferior pubic rami fractures on the right side also. Significant surrounding hematoma. No intrapelvic hematoma. The pubic symphysis and right SI joint are intact. Right-sided sacral fractures are also noted. IMPRESSION: 1. Severely displaced and comminuted complex right hip fracture with both intertrochanteric and basicervical neck components. 2. Superior and inferior pubic rami fractures on the right side and right-sided sacral fractures. Electronically Signed   By: Marijo Sanes M.D.   On: 11/06/2015 17:02   US Carotid Bilateral  11/05/2015  CLINICAL DATA:  Recent right femoral fracture and history of TIAs EXAM: BILATERAL CAROTID DUPLEX ULTRASOUND TECHNIQUE: Pearline Cables scale imaging, color Doppler and duplex ultrasound were performed of bilateral carotid and vertebral arteries in the neck. COMPARISON:  02/06/2013 FINDINGS: Criteria:  Quantification of carotid stenosis is based on velocity parameters that correlate the residual internal carotid diameter with NASCET-based stenosis levels, using the diameter of the distal internal carotid lumen as the denominator for stenosis measurement. The following velocity measurements were obtained: RIGHT ICA:  118/20 cm/sec CCA:  854/62 cm/sec SYSTOLIC ICA/CCA RATIO:  0.6 DIASTOLIC ICA/CCA RATIO:  0.8 ECA:  102 cm/sec LEFT ICA:  141/25 cm/sec CCA:  703/50 cm/sec SYSTOLIC ICA/CCA RATIO:  1.3 DIASTOLIC ICA/CCA RATIO:  1.5 ECA:  119 cm/sec RIGHT CAROTID ARTERY: Grayscale images demonstrate intimal thickening within the common carotid artery as well as plaque formation in the carotid bulb and extending into the proximal internal carotid artery. The waveforms, velocities and flow velocity ratios show no evidence of focal hemodynamically significant stenosis. RIGHT VERTEBRAL ARTERY:  Antegrade in nature. LEFT CAROTID ARTERY: Intimal thickening is noted within the common carotid artery on the left. Plaque formation in the carotid bulb and extending into the proximal internal carotid artery is noted. The waveforms, velocities and flow velocity ratios again demonstrate a stenosis in the 50-69% range likely in the lower end of that spectrum. LEFT VERTEBRAL ARTERY:  Antegrade in nature. IMPRESSION: 50-69% stenosis in the left internal carotid artery which is stable from the  prior exam. No significant stenosis on the right is noted. Electronically Signed   By: Inez Catalina M.D.   On: 11/05/2015 11:50    Scheduled Meds: . amiodarone  400 mg Oral Daily  . antiseptic oral rinse  7 mL Mouth Rinse BID  . aspirin EC  81 mg Oral q morning - 10a  . Chlorhexidine Gluconate Cloth  6 each Topical Q0600  . DULoxetine  60 mg Oral Daily  . enoxaparin (LOVENOX) injection  40 mg Subcutaneous Q24H  . lamoTRIgine  25 mg Oral Daily  . metoprolol tartrate  12.5 mg Oral BID  . mupirocin ointment  1 application Nasal BID  .  pantoprazole  40 mg Oral Daily  . simvastatin  20 mg Oral QPM  . sodium chloride  3 mL Intravenous Q12H  . vancomycin  1,000 mg Intravenous To SS-Surg   Continuous Infusions: . sodium chloride 100 mL/hr at 11/07/15 0525   Time spent: 25 minutes  [facility] [email or web address], password [facility] 11/07/2015, 9:43 AM  LOS: 3 days

## 2015-11-07 NOTE — Progress Notes (Addendum)
Patient seen in preop holding.  Cr this am 1.7 (1.3 yesterday)  Discussed situation with family and anesthesia. Kidney function needs to improve prior to surgery. Will reschedule surgery for tomorrow pending am labs. NPO after MN. Hold anticoags.

## 2015-11-07 NOTE — Progress Notes (Signed)
Subjective:  Less pain in the hip more awake today. Patient remains in sinus rhythm. Hypotensive but pressure in 80s and 90s renal function slowly improving  Objective:  Vital Signs in the last 24 hours: Temp:  [97.3 F (36.3 C)-99.1 F (37.3 C)] 97.3 F (36.3 C) (01/07 0716) Pulse Rate:  [73-119] 77 (01/07 0716) Resp:  [14-21] 16 (01/07 0716) BP: (77-124)/(38-59) 124/49 mmHg (01/07 0715) SpO2:  [92 %-100 %] 100 % (01/07 0716)  Intake/Output from previous day: 01/06 0701 - 01/07 0700 In: 2679.2 [P.O.:50; I.V.:2129.2; IV Piggyback:500] Out: 1275 [Urine:1275] Intake/Output from this shift: Total I/O In: -  Out: 225 [Urine:225]  Physical Exam: Neck: no adenopathy, no carotid bruit, no JVD and supple, symmetrical, trachea midline Lungs:   clear to auscutation anteriorly Heart: regular rate and rhythm, S1, S2 normal and  2/6 systolic urmur notd Abdomen: soft, non-tender; bowel sounds normal; no masses,  no organomegaly Extremities: extremities normal, atraumatic, no cyanosis or edema  Lab Results:  Recent Labs  11/04/15 2000  WBC 14.2*  HGB 12.1  PLT 224    Recent Labs  11/07/15 0341 11/07/15 1120  NA 138 140  K 4.3 4.6  CL 112* 113*  CO2 20* 23  GLUCOSE 118* 124*  BUN 28* 27*  CREATININE 1.73* 1.64*   No results for input(s): TROPONINI in the last 72 hours.  Invalid input(s): CK, MB Hepatic Function Panel  Recent Labs  11/04/15 2000  PROT 7.5  ALBUMIN 3.5  AST 27  ALT 19  ALKPHOS 96  BILITOT 0.8   No results for input(s): CHOL in the last 72 hours. No results for input(s): PROTIME in the last 72 hours.  Imaging: Imaging results have been reviewed and Ct Hip Right Wo Contrast  11/06/2015  CLINICAL DATA:  [place] 2 days ago.  Deformity of the right leg. EXAM: CT OF THE RIGHT HIP WITHOUT CONTRAST TECHNIQUE: Multidetector CT imaging of the right hip was performed according to the standard protocol. Multiplanar CT image reconstructions were also generated.  COMPARISON:  Radiographs 11/03/2005 FINDINGS: There is a severely displaced complex right hip fracture. This is an intertrochanteric fracture with extension into the base of the cervical neck. The greater and lesser trochanter fractures are displaced and comminuted. The femoral head is normally located. The acetabulum is intact. There are superior and inferior pubic rami fractures on the right side also. Significant surrounding hematoma. No intrapelvic hematoma. The pubic symphysis and right SI joint are intact. Right-sided sacral fractures are also noted. IMPRESSION: 1. Severely displaced and comminuted complex right hip fracture with both intertrochanteric and basicervical neck components. 2. Superior and inferior pubic rami fractures on the right side and right-sided sacral fractures. Electronically Signed   By: Marijo Sanes M.D.   On: 11/06/2015 17:02    Cardiac Studies:  Assessment/Plan:  Right hip  Complex fracture /  pubic rami / right-sided sacral fracture status post fall Status post paroxysmal A. fib flutter with 2-1 block converted to sinus rhythm chadsvac score of 8 Hypertrophic cardiomyopathy with SAM Severe mitral regurgitation and moderate tricuspid regurgitation Hypertension History of CVA in the past Hyperlipidemia Bilateral carotid artery disease Adenocarcinoma of the left lung Acute on chronc renal insufficiency seconary to hypotension Plan  Increase IV fluids as per order   Patient is  High risk or surgery due to multiple cmorbidties an age discssed with family and wantd to  Proceed.  LOS: 3 days    Charolette Forward 11/07/2015, 1:18 PM

## 2015-11-08 ENCOUNTER — Inpatient Hospital Stay (HOSPITAL_COMMUNITY): Payer: Medicare Other

## 2015-11-08 ENCOUNTER — Encounter (HOSPITAL_COMMUNITY)
Admission: EM | Disposition: A | Discharge status: Skilled Nursing Facility | Payer: Self-pay | Source: Home / Self Care | Attending: Internal Medicine

## 2015-11-08 ENCOUNTER — Inpatient Hospital Stay (HOSPITAL_COMMUNITY): Payer: Medicare Other | Admitting: Anesthesiology

## 2015-11-08 DIAGNOSIS — S72141A Displaced intertrochanteric fracture of right femur, initial encounter for closed fracture: Secondary | ICD-10-CM | POA: Diagnosis present

## 2015-11-08 HISTORY — PX: INTRAMEDULLARY (IM) NAIL INTERTROCHANTERIC: SHX5875

## 2015-11-08 LAB — T3, FREE: T3 FREE: 1.2 pg/mL — AB (ref 2.0–4.4)

## 2015-11-08 LAB — BASIC METABOLIC PANEL
Anion gap: 6 (ref 5–15)
BUN: 21 mg/dL — AB (ref 6–20)
CHLORIDE: 113 mmol/L — AB (ref 101–111)
CO2: 21 mmol/L — ABNORMAL LOW (ref 22–32)
Calcium: 8.3 mg/dL — ABNORMAL LOW (ref 8.9–10.3)
Creatinine, Ser: 1.38 mg/dL — ABNORMAL HIGH (ref 0.44–1.00)
GFR calc non Af Amer: 37 mL/min — ABNORMAL LOW (ref >60)
GFR, EST AFRICAN AMERICAN: 43 mL/min — AB (ref >60)
Glucose, Bld: 112 mg/dL — ABNORMAL HIGH (ref 65–99)
Potassium: 4.2 mmol/L (ref 3.5–5.1)
SODIUM: 140 mmol/L (ref 135–145)

## 2015-11-08 SURGERY — FIXATION, FRACTURE, INTERTROCHANTERIC, WITH INTRAMEDULLARY ROD
Anesthesia: General | Site: Hip | Laterality: Right

## 2015-11-08 MED ORDER — METOCLOPRAMIDE HCL 5 MG/ML IJ SOLN: 5.0000 mg | Freq: Three times a day (TID) | INTRAMUSCULAR | Status: DC | PRN

## 2015-11-08 MED ORDER — MORPHINE SULFATE (PF) 2 MG/ML IV SOLN
0.5000 mg | INTRAVENOUS | Status: DC | PRN
  Administered 2015-11-08: 0.5 mg via INTRAVENOUS
  Filled 2015-11-08: qty 1

## 2015-11-08 MED ORDER — FENTANYL CITRATE (PF) 100 MCG/2ML IJ SOLN
INTRAMUSCULAR | Status: AC
  Filled 2015-11-08: qty 2

## 2015-11-08 MED ORDER — PROPOFOL 10 MG/ML IV BOLUS
INTRAVENOUS | Status: DC | PRN
  Administered 2015-11-08: 20 mg via INTRAVENOUS
  Administered 2015-11-08 (×2): 10 mg via INTRAVENOUS
  Administered 2015-11-08: 100 mg via INTRAVENOUS

## 2015-11-08 MED ORDER — FENTANYL CITRATE (PF) 250 MCG/5ML IJ SOLN
INTRAMUSCULAR | Status: DC | PRN
  Administered 2015-11-08 (×3): 25 ug via INTRAVENOUS
  Administered 2015-11-08: 50 ug via INTRAVENOUS
  Administered 2015-11-08 (×3): 25 ug via INTRAVENOUS

## 2015-11-08 MED ORDER — PROPOFOL 10 MG/ML IV BOLUS
INTRAVENOUS | Status: AC
  Filled 2015-11-08: qty 40

## 2015-11-08 MED ORDER — ASPIRIN EC 325 MG PO TBEC: 325.0000 mg | DELAYED_RELEASE_TABLET | Freq: Two times a day (BID) | ORAL | Status: DC

## 2015-11-08 MED ORDER — HYDROMORPHONE HCL 1 MG/ML IJ SOLN
0.5000 mg | INTRAMUSCULAR | Status: DC | PRN
  Administered 2015-11-08 – 2015-11-09 (×6): 1 mg via INTRAVENOUS
  Filled 2015-11-08 (×6): qty 1

## 2015-11-08 MED ORDER — FENTANYL CITRATE (PF) 250 MCG/5ML IJ SOLN
INTRAMUSCULAR | Status: AC
  Filled 2015-11-08: qty 10

## 2015-11-08 MED ORDER — OXYCODONE HCL 5 MG PO TABS: 5.0000 mg | ORAL_TABLET | Freq: Once | ORAL | Status: DC | PRN

## 2015-11-08 MED ORDER — ONDANSETRON HCL 4 MG PO TABS: 4.0000 mg | ORAL_TABLET | Freq: Four times a day (QID) | ORAL | Status: DC | PRN

## 2015-11-08 MED ORDER — CLOPIDOGREL BISULFATE 75 MG PO TABS
75.0000 mg | ORAL_TABLET | Freq: Every day | ORAL | Status: DC
  Filled 2015-11-08: qty 1

## 2015-11-08 MED ORDER — ONDANSETRON HCL 4 MG/2ML IJ SOLN
4.0000 mg | Freq: Four times a day (QID) | INTRAMUSCULAR | Status: DC | PRN
  Administered 2015-11-10: 4 mg via INTRAVENOUS
  Filled 2015-11-08: qty 2

## 2015-11-08 MED ORDER — ONDANSETRON HCL 4 MG/2ML IJ SOLN
INTRAMUSCULAR | Status: AC
  Filled 2015-11-08: qty 2

## 2015-11-08 MED ORDER — ROCURONIUM BROMIDE 50 MG/5ML IV SOLN
INTRAVENOUS | Status: AC
  Filled 2015-11-08: qty 2

## 2015-11-08 MED ORDER — MENTHOL 3 MG MT LOZG: 1.0000 | LOZENGE | OROMUCOSAL | Status: DC | PRN

## 2015-11-08 MED ORDER — PHENYLEPHRINE HCL 10 MG/ML IJ SOLN
10.0000 mg | INTRAVENOUS | Status: DC | PRN
  Administered 2015-11-08: 60 ug/min via INTRAVENOUS

## 2015-11-08 MED ORDER — ACETAMINOPHEN 650 MG RE SUPP
650.0000 mg | Freq: Four times a day (QID) | RECTAL | Status: DC | PRN
  Administered 2015-11-10 – 2015-11-15 (×2): 650 mg via RECTAL
  Filled 2015-11-08 (×3): qty 1

## 2015-11-08 MED ORDER — ONDANSETRON HCL 4 MG/2ML IJ SOLN: 4.0000 mg | Freq: Four times a day (QID) | INTRAMUSCULAR | Status: DC | PRN

## 2015-11-08 MED ORDER — WHITE PETROLATUM GEL
Status: AC
  Filled 2015-11-08: qty 1

## 2015-11-08 MED ORDER — ACETAMINOPHEN 325 MG PO TABS
650.0000 mg | ORAL_TABLET | Freq: Four times a day (QID) | ORAL | Status: DC | PRN
  Administered 2015-11-11 – 2015-11-12 (×3): 650 mg via ORAL
  Filled 2015-11-08 (×3): qty 2

## 2015-11-08 MED ORDER — METOCLOPRAMIDE HCL 5 MG PO TABS: 5.0000 mg | ORAL_TABLET | Freq: Three times a day (TID) | ORAL | Status: DC | PRN

## 2015-11-08 MED ORDER — VANCOMYCIN HCL IN DEXTROSE 1-5 GM/200ML-% IV SOLN
1000.0000 mg | Freq: Two times a day (BID) | INTRAVENOUS | Status: AC
  Administered 2015-11-08: 1000 mg via INTRAVENOUS
  Filled 2015-11-08: qty 200

## 2015-11-08 MED ORDER — LIDOCAINE HCL (CARDIAC) 20 MG/ML IV SOLN
INTRAVENOUS | Status: AC
  Filled 2015-11-08: qty 10

## 2015-11-08 MED ORDER — HYDROCODONE-ACETAMINOPHEN 5-325 MG PO TABS: 1.0000 | ORAL_TABLET | Freq: Four times a day (QID) | ORAL | Status: DC | PRN

## 2015-11-08 MED ORDER — 0.9 % SODIUM CHLORIDE (POUR BTL) OPTIME
TOPICAL | Status: DC | PRN
  Administered 2015-11-08: 1000 mL

## 2015-11-08 MED ORDER — FENTANYL CITRATE (PF) 100 MCG/2ML IJ SOLN
25.0000 ug | INTRAMUSCULAR | Status: DC | PRN
  Administered 2015-11-08: 25 ug via INTRAVENOUS

## 2015-11-08 MED ORDER — ONDANSETRON HCL 4 MG/2ML IJ SOLN
INTRAMUSCULAR | Status: DC | PRN
  Administered 2015-11-08: 4 mg via INTRAVENOUS

## 2015-11-08 MED ORDER — CLINDAMYCIN PHOSPHATE 900 MG/50ML IV SOLN
INTRAVENOUS | Status: AC
  Administered 2015-11-08: 900 mg via INTRAVENOUS
  Filled 2015-11-08: qty 50

## 2015-11-08 MED ORDER — LIDOCAINE HCL (CARDIAC) 20 MG/ML IV SOLN
INTRAVENOUS | Status: DC | PRN
  Administered 2015-11-08: 40 mg via INTRAVENOUS

## 2015-11-08 MED ORDER — PHENYLEPHRINE HCL 10 MG/ML IJ SOLN
INTRAMUSCULAR | Status: DC | PRN
  Administered 2015-11-08: 80 ug via INTRAVENOUS

## 2015-11-08 MED ORDER — PHENOL 1.4 % MT LIQD: 1.0000 | OROMUCOSAL | Status: DC | PRN

## 2015-11-08 MED ORDER — OXYCODONE HCL 5 MG/5ML PO SOLN: 5.0000 mg | Freq: Once | ORAL | Status: DC | PRN

## 2015-11-08 SURGICAL SUPPLY — 52 items
ADH SKN CLS APL DERMABOND .7 (GAUZE/BANDAGES/DRESSINGS) ×3
ALCOHOL ISOPROPYL (RUBBING) (MISCELLANEOUS) ×3 IMPLANT
BIT DRILL CALIBRATED 4.2 (BIT) IMPLANT
BNDG COHESIVE 4X5 TAN STRL (GAUZE/BANDAGES/DRESSINGS) IMPLANT
CHLORAPREP W/TINT 26ML (MISCELLANEOUS) ×3 IMPLANT
COVER PERINEAL POST (MISCELLANEOUS) ×3 IMPLANT
COVER SURGICAL LIGHT HANDLE (MISCELLANEOUS) ×3 IMPLANT
DERMABOND ADVANCED (GAUZE/BANDAGES/DRESSINGS) ×6
DERMABOND ADVANCED .7 DNX12 (GAUZE/BANDAGES/DRESSINGS) ×2 IMPLANT
DRAPE C-ARM 42X72 X-RAY (DRAPES) ×3 IMPLANT
DRAPE C-ARMOR (DRAPES) ×3 IMPLANT
DRAPE IMP U-DRAPE 54X76 (DRAPES) ×6 IMPLANT
DRAPE STERI IOBAN 125X83 (DRAPES) ×3 IMPLANT
DRAPE U-SHAPE 47X51 STRL (DRAPES) ×6 IMPLANT
DRAPE UNIVERSAL PACK (DRAPES) ×3 IMPLANT
DRESSING ALLEVYN LIFE SACRUM (GAUZE/BANDAGES/DRESSINGS) ×2 IMPLANT
DRILL BIT CALIBRATED 4.2 (BIT) ×3
DRSG MEPILEX BORDER 4X4 (GAUZE/BANDAGES/DRESSINGS) ×4 IMPLANT
DRSG MEPILEX BORDER 4X8 (GAUZE/BANDAGES/DRESSINGS) ×2 IMPLANT
DRSG PAD ABDOMINAL 8X10 ST (GAUZE/BANDAGES/DRESSINGS) IMPLANT
ELECT REM PT RETURN 9FT ADLT (ELECTROSURGICAL) ×3
ELECTRODE REM PT RTRN 9FT ADLT (ELECTROSURGICAL) ×1 IMPLANT
FACESHIELD STD STERILE (MASK) ×2 IMPLANT
FACESHIELD WRAPAROUND (MASK) ×3 IMPLANT
FACESHIELD WRAPAROUND OR TEAM (MASK) ×1 IMPLANT
GLOVE BIO SURGEON STRL SZ8.5 (GLOVE) ×6 IMPLANT
GLOVE BIOGEL PI IND STRL 8.5 (GLOVE) ×1 IMPLANT
GLOVE BIOGEL PI INDICATOR 8.5 (GLOVE) ×4
GLOVE SURG ORTHO 8.5 STRL (GLOVE) ×6 IMPLANT
GLOVE SURG SS PI 7.0 STRL IVOR (GLOVE) ×2 IMPLANT
GOWN STRL REUS W/ TWL LRG LVL3 (GOWN DISPOSABLE) ×2 IMPLANT
GOWN STRL REUS W/TWL 2XL LVL3 (GOWN DISPOSABLE) ×5 IMPLANT
GOWN STRL REUS W/TWL LRG LVL3 (GOWN DISPOSABLE) ×6
GUIDEWIRE 3.2X400 (WIRE) ×6 IMPLANT
KIT ROOM TURNOVER OR (KITS) ×3 IMPLANT
MANIFOLD NEPTUNE II (INSTRUMENTS) ×3 IMPLANT
MARKER SKIN DUAL TIP RULER LAB (MISCELLANEOUS) ×3 IMPLANT
NAIL TROCH FIX 10X170 130 (Nail) ×2 IMPLANT
NS IRRIG 1000ML POUR BTL (IV SOLUTION) ×3 IMPLANT
PACK GENERAL/GYN (CUSTOM PROCEDURE TRAY) ×3 IMPLANT
PAD ARMBOARD 7.5X6 YLW CONV (MISCELLANEOUS) ×6 IMPLANT
PADDING CAST ABS 4INX4YD NS (CAST SUPPLIES)
PADDING CAST ABS COTTON 4X4 ST (CAST SUPPLIES) IMPLANT
SCREW LAG 95MM (Screw) ×3 IMPLANT
SCREW LAG 95X10.35XCANN NS FE (Screw) IMPLANT
SCREW LOCK STAR 5X36 (Screw) ×2 IMPLANT
SUT MNCRL AB 3-0 PS2 27 (SUTURE) ×1 IMPLANT
SUT MON AB 2-0 CT1 27 (SUTURE) ×3 IMPLANT
SUT VIC AB 1 CT1 27 (SUTURE)
SUT VIC AB 1 CT1 27XBRD ANBCTR (SUTURE) ×1 IMPLANT
TOWEL OR 17X24 6PK STRL BLUE (TOWEL DISPOSABLE) ×3 IMPLANT
TOWEL OR 17X26 10 PK STRL BLUE (TOWEL DISPOSABLE) ×3 IMPLANT

## 2015-11-08 NOTE — Discharge Instructions (Signed)
Dr. Rod Can Adult Hip [facility] Knee Specialist [facility] [address] [place], [place] [address] [phone]   POSTOPERATIVE DIRECTIONS    Hip Rehabilitation, Guidelines Following Surgery   WEIGHT BEARING Other:  Touch Down Weight Bearign Right Leg. (30%)   HOME CARE INSTRUCTIONS  Remove items at home which could result in a fall. This includes throw rugs or furniture in walking pathways.  Continue medications as instructed at time of discharge.  You may have some home medications which will be placed on hold until you complete the course of blood thinner medication.  4 days after discharge, you may start showering. No tub baths or soaking your incisions. Do not put on socks or shoes without following the instructions of your caregivers.   Sit on chairs with arms. Use the chair arms to help push yourself up when arising.  Arrange for the use of a toilet seat elevator so you are not sitting low.  You may resume a sexual relationship in one month or when given the OK by your caregiver.  Use walker as long as suggested by your caregivers.  Avoid periods of inactivity such as sitting longer than an hour when not asleep. This helps prevent blood clots.  You may return to work once you are cleared by Engineer, production.  Do not drive a car for 6 weeks or until released by your surgeon.  Do not drive while taking narcotics.  Wear elastic stockings for two weeks following surgery during the day but you may remove then at night.  Make sure you keep all of your appointments after your operation with all of your doctors and caregivers. You should call the office at the above phone number and make an appointment for approximately two weeks after the date of your surgery. Please pick up a stool softener and laxative for home use as long as you are requiring pain medications.  ICE to the affected hip every three hours for 30 minutes at a time and then as needed for  pain and swelling. Continue to use ice on the hip for pain and swelling from surgery. You may notice swelling that will progress down to the foot and ankle.  This is normal after surgery.  Elevate the leg when you are not up walking on it.   It is important for you to complete the blood thinner medication as prescribed by your doctor.  Continue to use the breathing machine which will help keep your temperature down.  It is common for your temperature to cycle up and down following surgery, especially at night when you are not up moving around and exerting yourself.  The breathing machine keeps your lungs expanded and your temperature down.  RANGE OF MOTION AND STRENGTHENING EXERCISES  These exercises are designed to help you keep full movement of your hip joint. Follow your caregiver's or physical therapist's instructions. Perform all exercises about fifteen times, three times per day or as directed. Exercise both hips, even if you have had only one joint replacement. These exercises can be done on a training (exercise) mat, on the floor, on a table or on a bed. Use whatever works the best and is most comfortable for you. Use music or television while you are exercising so that the exercises are a pleasant break in your day. This will make your life better with the exercises acting as a break in routine you can look forward to.  Lying on your back, slowly slide your  foot toward your buttocks, raising your knee up off the floor. Then slowly slide your foot back down until your leg is straight again.  Lying on your back spread your legs as far apart as you can without causing discomfort.  Lying on your side, raise your upper leg and foot straight up from the floor as far as is comfortable. Slowly lower the leg and repeat.  Lying on your back, tighten up the muscle in the front of your thigh (quadriceps muscles). You can do this by keeping your leg straight and trying to raise your heel off the floor. This  helps strengthen the largest muscle supporting your knee.  Lying on your back, tighten up the muscles of your buttocks both with the legs straight and with the knee bent at a comfortable angle while keeping your heel on the floor.   SKILLED REHAB INSTRUCTIONS: If the patient is transferred to a skilled rehab facility following release from the hospital, a list of the current medications will be sent to the facility for the patient to continue.  When discharged from the skilled rehab facility, please have the facility set up the patient's [place] prior to being released. Also, the skilled facility will be responsible for providing the patient with their medications at time of release from the facility to include their pain medication and their blood thinner medication. If the patient is still at the rehab facility at time of the two week follow up appointment, the skilled rehab facility will also need to assist the patient in arranging follow up appointment in our office and any transportation needs.  MAKE SURE YOU:  Understand these instructions.  Will watch your condition.  Will get help right away if you are not doing well or get worse.  Pick up stool softner and laxative for home use following surgery while on pain medications. Daily dry dressing changes as needed. In 4 days, you may remove your dressings and begin taking showers - no tub baths or soaking the incisions. Continue to use ice for pain and swelling after surgery. Do not use any lotions or creams on the incision until instructed by your surgeon.

## 2015-11-08 NOTE — Progress Notes (Addendum)
Pt continues to have difficulty taking POs(crushed), sputters d/t confusion. Requested more IV pain med. Unable to take PO amio today. Fortunately SR controlled rate. Will continue to monitor.

## 2015-11-08 NOTE — Op Note (Signed)
Stephanie [name], GORSLINE NO.:  [id number]  MEDICAL RECORD NO.:  [id number]  LOCATION:  [facility]                         FACILITY:  [facility]  PHYSICIAN:  Rod Can, MD     DATE OF BIRTH:  1940/12/07  DATE OF PROCEDURE:  11/08/2015 DATE OF DISCHARGE:                              OPERATIVE REPORT   PREOPERATIVE DIAGNOSIS:  Comminuted right pertrochanteric femur fracture with basicervical component.  POSTOPERATIVE DIAGNOSIS:  Comminuted right pertrochanteric femur fracture with basicervical component.  PROCEDURE PERFORMED:  Intramedullary fixation of right pertrochanteric and femoral neck fracture.  IMPLANTS: 1. Synthes TFNA nail 10 x 170 mm, 130 degrees. 2. 95 mm lag screw. 3. 5.0 mm distal interlocking screw x1.  ANESTHESIA: 1. General.  ESTIMATED BLOOD LOSS:  150 mL.  SPECIMENS:  None.  TUBES AND DRAINS:  None.  ANTIBIOTICS: 1. 1 g of vancomycin. 2. 900 mg clindamycin.  DISPOSITION:  Stable to PACU.  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  She is a non- ambulator who lives in a nursing home.  She fell out of her wheelchair and had hip pain.  She was brought to the emergency department, found to have the above-mentioned fracture.  CT scan was also obtained which showed right LC-1 pelvis fracture.  The patient does stand to transfer, and she uses her legs to propel herself with her wheelchair.  I had a lengthy discussion with the family regarding the risks, benefits, and alternatives to surgical versus nonsurgical fixation.  They wanted to proceed with surgery in order to prevent her from being on bed rest for the next few weeks.  Risks, benefits, and alternatives were explained. They elected to proceed.  DESCRIPTION OF PROCEDURE IN DETAIL:  I identified the patient in the holding area.  The surgical site was marked by myself.  She was taken to the operating room.   General anesthesia was induced on her bed.  She was then transferred to the [facility] table.  The left lower extremity was scissored underneath the right.  She did have a flexion contracture to the right knee as well as a valgus deformity.  I reduced the fracture with standard traction, internal rotation, and adduction.  Fracture was somewhat difficult to pull out to length.  I was able to achieve an acceptable reduction.  The right hip was prepped and draped in normal sterile surgical fashion.  Time-out was called verifying site and site of surgery.  I made about a 5 inch incision proximal to the tip of the trochanter.  I used the awl to determine the starting point.  Getting the starting location was difficult as the tip of her greater trochanter was very comminuted.  Once I localized the starting point, I placed a guide pin under AP and lateral fluoroscopic control.  I then used the entry reamer.  I passed the real nail, and this was seated to the appropriate depth.  I made a separate stab incision.  Through the jig, I inserted the cannula down to the bone for the cephalomedullary device.  I then placed the cephalomedullary pin with AP and  lateral fluoroscopy views.  I measured the length of the pin.  I then through the jig placed an anti-rotational pin into the femoral head.  I then reamed for the cephalomedullary screw, and the screw was placed.  I tightened the set screw.  I then placed a distal interlocking screw through the jig using standard AO technique.  I removed the jig.  I obtained final AP and lateral fluoroscopy views.  She had a good reduction of her fracture.  Tip-apex distance was appropriate.  The neck shaft angle was recreated well.  There was no chondral penetration.  The wounds were then copiously irrigated.  I closed the wounds in layers with #1 Vicryl for the fascia, 2-0 Monocryl for the deep dermal layer, running 3-0 Monocryl subcuticular stitch.  Glue was applied to  the skin. Once the glue was fully hardened, sterile dressings were applied.  The patient was then transferred to her bed, awakened from anesthesia, taken to PACU in stable condition.  Sponge, needle, and instrument counts were correct at the end of the case x2.  There were no known complications.  We will readmit the patient to the Hospitalist Service.  She will be touchdown weightbearing to the right lower extremity, bed-to-chair transfers.  She will work with Physical and Occupational Therapy.   She will continue Plavix, and I will add daily Aspirin.  I am going  to see her back in the office 2 weeks after discharge.          ______________________________ Rod Can, MD     BS/MEDQ  D:  11/08/2015  T:  11/08/2015  Job:  [id number]

## 2015-11-08 NOTE — Progress Notes (Addendum)
[facility] PROGRESS NOTE   BENJAMIN MERRIHEW YTK:[id number] DOB: [birth year] DOA: 11/04/2015 PCP: [name], MD  Summary 75 year old aphasic white female from skilled nursing facility who fell out of wheelchair sustained a right hip fracture and was admitted to [facility] on 1/4. She was also noted to have new A. fib with RVR. She was started on a Cardizem drip and briefly became hypotensive. Drip was subsequently stopped. Dr. Aline Brochure, orthopedics was consulted and felt that patient needed transfer to a higher level of care for anesthesia and cardiology coverage.  Hospitalist at [facility] felt she was medically optimized. She has no previous history of fracture nor cardiac issues. At baseline, she is able to maneuver fairly well in a wheelchair at skilled nursing facility.  Assessment/Plan: Principal Problem:   Hip fracture, right (HCC) Active Problems:   Adenocarcinoma of left lung, stage 1 (HCC)   Bilateral carotid artery disease (HCC)   Hyperlipidemia   Hypotension   CKD (chronic kidney disease), stage III   Sacral fracture, closed (HCC)   Closed right hip fracture (HCC)   Atrial fibrillation (HCC)   right superior and inferior pubic rami   Fracture, intertrochanteric, right femur (HCC)   Right hip Fracture S/p IM nail  Right sacral fracture/Right superior and inferior pubic rami fx seen on CT  New onset of paroxysmal A. 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 Dr. Terrence Dupont transitioned to PO amiodarone. CHADSVASC 5. Eventual anticoagulation  Severe MR  HOCM  TIA/stroke -Has history of stroke in 2012, recent history of TIA in December 2016. -Patient is on Plavix, held for the upcoming surgery Aphasic since 2/12  Acute renal failure on CKD stage III. Ranges 1.1-1.4. Creat 1.3 today.  History of lung cancer stage I History of lung cancer diagnosed January 2013 with surgical resection, was T2a, pN0. Continue Iressa, per last nephrology  note question of progression of adenocarcinoma but not proven by biopsy.  HLD -Continue with statin.  HTN  Code Status: DNR Family Communication: granddaughter at bedside Disposition Plan: Back to skilled nursing facility after surgery. Diet: D1  Consultants:  Ortho  cardiology  Procedures:  None  Antibiotics:  None  Subjective: Unable.   Objective: Filed Vitals:   11/08/15 1145 11/08/15 1200  BP: 129/58 126/94  Pulse: 82 96  Temp:  98 F (36.7 C)  Resp: 18 17    Intake/Output Summary (Last 24 hours) at 11/08/15 1609 Last data filed at 11/08/15 1147  Gross per 24 hour  Intake   4020 ml  Output   1395 ml  Net   2625 ml   Filed Weights   11/05/15 0110 11/05/15 0500 11/05/15 2224  Weight: 65.7 kg (144 lb 13.5 oz) 65.7 kg (144 lb 13.5 oz) 70 kg (154 lb 5.2 oz)    telemetry: NSR  Exam: General: asleep. Arousable. 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 No results for input(s): AMMONIA in the last 168 hours. CBC:  Recent Labs Lab 11/03/15 1113 11/04/15 2000  WBC 6.1  14.2*  NEUTROABS 4.3 12.3*  HGB 12.6 12.1  HCT 38.2 35.9*  MCV 99.7 97.6  PLT 205 224   Cardiac Enzymes: No results for input(s): CKTOTAL, CKMB, CKMBINDEX, TROPONINI in the last 168 hours. BNP (last 3 results) No results for input(s): BNP in the last 8760 hours.  ProBNP (last 3 results) No results for input(s): PROBNP in the last 8760 hours.  CBG: No results for input(s): GLUCAP in the last 168 hours.  Micro No results found for this or any previous visit (from the past 240 hour(s)).   Studies: Ct Hip Right Wo Contrast  11/06/2015  CLINICAL DATA:  [place] 2 days ago.  Deformity of the right leg. EXAM: CT OF THE RIGHT HIP WITHOUT CONTRAST TECHNIQUE: Multidetector CT imaging of the right hip was performed according to the standard protocol. Multiplanar CT image reconstructions were also generated. COMPARISON:  Radiographs 11/03/2005 FINDINGS: There is a severely displaced complex right hip fracture. This is an intertrochanteric fracture with extension into the base of the cervical neck. The greater and lesser trochanter fractures are displaced and comminuted. The femoral head is normally located. The acetabulum is intact. There are superior and inferior pubic rami fractures on the right side also. Significant surrounding hematoma. No intrapelvic hematoma. The pubic symphysis and right SI joint are intact. Right-sided sacral fractures are also noted. IMPRESSION: 1. Severely displaced and comminuted complex right hip fracture with both intertrochanteric and basicervical neck components. 2. Superior and inferior pubic rami fractures on the right side and right-sided sacral fractures. Electronically Signed   By: Marijo Sanes M.D.   On: 11/06/2015 17:02   Dg Hip Operative Unilat With Pelvis Right  11/08/2015  CLINICAL DATA:  IM nail of right hip EXAM: OPERATIVE right HIP (WITH PELVIS IF PERFORMED) 2 VIEWS TECHNIQUE: Fluoroscopic spot image(s) were submitted for interpretation post-operatively.  COMPARISON:  11/04/2015 FINDINGS: Hip screw an IM nail reduces the intertrochanteric fracture involving the proximal right femur. The fracture fragments and hardware components are in anatomic alignment. No complications identified. IMPRESSION: 1. Status post ORIF of proximal femur fracture. Electronically Signed   By: Kerby Moors M.D.   On: 11/08/2015 11:04   Dg Hip Unilat With Pelvis 2-3 Views Right  11/08/2015  CLINICAL DATA:  Postop right hip pinning.  Confusion. EXAM: DG HIP (WITH OR WITHOUT PELVIS) 2-3V RIGHT COMPARISON:  Intraoperative fluoroscopic spot images from earlier same day. Comparison also made to earlier plain film of the right hip dated 11/04/2015. FINDINGS: Intra medullary rod with associated dynamic hip screw traverses the right femoral neck fracture site. Hardware appears intact and well aligned. Osseous alignment is anatomic. No surgical complicating features seen. IMPRESSION: IMPRESSION Status post fixation of the right femoral neck fracture. Hardware appears intact and well aligned. No surgical complicating feature. Electronically Signed   By: Franki Cabot M.D.   On: 11/08/2015 12:02    Scheduled Meds: . amiodarone  400 mg Oral Daily  . antiseptic oral rinse  7 mL Mouth Rinse BID  .  aspirin EC  325 mg Oral BID PC  . Chlorhexidine Gluconate Cloth  6 each Topical Q0600  . clopidogrel  75 mg Oral Daily  . DULoxetine  60 mg Oral Daily  . fentaNYL      . lamoTRIgine  25 mg Oral Daily  . metoprolol tartrate  12.5 mg Oral BID  . mupirocin ointment  1 application Nasal BID  . pantoprazole  40 mg Oral Daily  . simvastatin  20 mg Oral QPM  . sodium chloride  3 mL Intravenous Q12H  . vancomycin  1,000 mg Intravenous Q12H  . white petrolatum       Continuous Infusions: . sodium chloride 100 mL/hr at 11/08/15 0056   Time spent: 25 minutes  [facility] [email or web address], password [facility] 11/08/2015, 4:09 PM  LOS: 4 days

## 2015-11-08 NOTE — Anesthesia Procedure Notes (Signed)
Procedure Name: LMA Insertion Date/Time: 11/08/2015 8:59 AM Performed by: Marinda Elk A Pre-anesthesia Checklist: Patient identified, Timeout performed, Emergency Drugs available, Suction available and Patient being monitored Patient Re-evaluated:Patient Re-evaluated prior to inductionOxygen Delivery Method: Circle system utilized Intubation Type: IV induction Ventilation: Mask ventilation without difficulty LMA: LMA inserted LMA Size: 4.0 Number of attempts: 1 Placement Confirmation: positive ETCO2 and breath sounds checked- equal and bilateral Tube secured with: Tape Dental Injury: Teeth and Oropharynx as per pre-operative assessment

## 2015-11-08 NOTE — Anesthesia Postprocedure Evaluation (Signed)
Anesthesia Post Note  Patient: Stephanie [name]  Procedure(s) Performed: Procedure(s) (LRB): INTRAMEDULLARY (IM) NAIL INTERTROCHANTERIC (Right)  Patient location during evaluation: PACU Anesthesia Type: General Level of consciousness: awake and alert and patient cooperative Pain management: pain level controlled Vital Signs Assessment: post-procedure vital signs reviewed and stable Respiratory status: spontaneous breathing and respiratory function stable Cardiovascular status: stable Anesthetic complications: no    Last Vitals:  Filed Vitals:   11/08/15 1145 11/08/15 1200  BP: 129/58 126/94  Pulse: 82 96  Temp:    Resp: 18 17    Last Pain:  Filed Vitals:   11/08/15 1208  PainSc: [place]

## 2015-11-08 NOTE — Brief Op Note (Signed)
11/04/2015 - 11/08/2015  10:54 AM  PATIENT:  Stephanie [name]  75 y.o. female  PRE-OPERATIVE DIAGNOSIS:  RIGHT INTERTROCHANTERIC FRACTURE  POST-OPERATIVE DIAGNOSIS:  RIGHT INTERTROCHANTERIC FRACTURE  PROCEDURE:  Procedure(s): INTRAMEDULLARY (IM) NAIL INTERTROCHANTERIC (Right)  SURGEON:  Surgeon(s) and Role:    * Rod Can, MD - Primary  PHYSICIAN ASSISTANT:   ASSISTANTS: none   ANESTHESIA:   general  EBL:  Total I/O In: 500 [I.V.:500] Out: 620 [Urine:470; Blood:150]  BLOOD ADMINISTERED:none  DRAINS: none   LOCAL MEDICATIONS USED:  NONE  SPECIMEN:  No Specimen  DISPOSITION OF SPECIMEN:  N/A  COUNTS:  YES  TOURNIQUET:  * No tourniquets in log *  DICTATION: .Other Dictation: Dictation Number [id number]  PLAN OF CARE: Admit to inpatient   PATIENT DISPOSITION:  PACU - hemodynamically stable.   Delay start of Pharmacological VTE agent (>24hrs) due to surgical blood loss or risk of bleeding: no

## 2015-11-08 NOTE — Interval H&P Note (Signed)
History and Physical Interval Note:  11/08/2015 8:23 AM  Stephanie [name]  has presented today for surgery, with the diagnosis of RIGHT INTERTROCHANTERIC FRACTURE  The various methods of treatment have been discussed with the patient and family. After consideration of risks, benefits and other options for treatment, the patient has consented to  Procedure(s): INTRAMEDULLARY (IM) NAIL INTERTROCHANTERIC (Right) as a surgical intervention .  The patient's history has been reviewed, patient examined, no change in status, stable for surgery.  I have reviewed the patient's chart and labs.  Questions were answered to the patient's satisfaction.     Ulrick Methot, Horald Pollen

## 2015-11-08 NOTE — Transfer of Care (Signed)
Immediate Anesthesia Transfer of Care Note  Patient: Stephanie [name]  Procedure(s) Performed: Procedure(s): INTRAMEDULLARY (IM) NAIL INTERTROCHANTERIC (Right)  Patient Location: PACU  Anesthesia Type:General  Level of Consciousness: sedated  Airway & Oxygen Therapy: Patient Spontanous Breathing and Patient connected to nasal cannula oxygen  Post-op Assessment: Report given to RN and Post -op Vital signs reviewed and stable  Post vital signs: Reviewed and stable  Last Vitals:  Filed Vitals:   11/08/15 0600 11/08/15 0700  BP: 103/50   Pulse: 92   Temp:  36.7 C  Resp: 18     Complications: No apparent anesthesia complications

## 2015-11-08 NOTE — H&P (View-Only) (Signed)
Stephanie Lange, MD Chief Complaint: Right hip fracture History: HPI: 75 year old female a phasic so had to take the history from the ER record and the history and physical dictated by Dr. Truman Hayward. Basically she had a stroke workup in December 2016 unclear if she actually had a stroke or TIA. She was successfully treated with back to the nursing home where she is a household ambulator bed to chair stand with assist ambulator.  Review of systems deferred as well because of aphasia Past Medical History  Diagnosis Date  . CVA (cerebral infarction)     speech difficulities  . Stroke ([place])   . Hypercholesteremia     Takes Zocor daily  . Hypertension     takes Atenolol and Lisinopril daily  . Lung mass     left upper lobe  . Arthritis     hands  . TIA (transient ischemic attack)     Sat before thanksgiving;impaired speech  . Pneumothorax   . Lung cancer ([place])   . CKD (chronic kidney disease), stage III     No Known Allergies  No current facility-administered medications on file prior to encounter.   Current Outpatient Prescriptions on File Prior to Encounter  Medication Sig Dispense Refill  . acetaminophen (TYLENOL) 325 MG tablet Take 650 mg by mouth every 6 (six) hours as needed for mild pain or moderate pain.     Marland Kitchen ALPRAZolam (XANAX) 0.25 MG tablet Take one tablet by mouth three times daily as needed for anxiety. Monitor for sedation (Patient taking differently: Take 0.25 mg by mouth 3 (three) times daily as needed for anxiety. ) 90 tablet 0  . alum & mag hydroxide-simeth (MAALOX/MYLANTA) 200-200-20 MG/5 SUSP Apply 1 application topically as needed. (Patient taking differently: Apply 1 application topically every 6 (six) hours as needed (for indigestion). ) 710 mL 0  . aspirin EC 81 MG tablet Take 81 mg by mouth every morning. Reported on 11/04/2015    . atenolol (TENORMIN) 25 MG tablet Take 25 mg by mouth daily.    . clopidogrel (PLAVIX) 75 MG tablet Take 75 mg by mouth daily.    .  diphenoxylate-atropine ([name]) 2.5-0.025 MG tablet Take 1 tablet by mouth 4 (four) times daily as needed for diarrhea or loose stools.     . Doxepin HCl 3 MG TABS Take 1 tablet (3 mg total) by mouth at bedtime. 30 tablet 2  . DULoxetine (CYMBALTA) 60 MG capsule Take 60 mg by mouth daily.    Marland Kitchen gefitinib (IRESSA) 250 MG tablet Take 1 tablet (250 mg total) by mouth daily. 30 tablet 6  . guaiFENesin-dextromethorphan (ROBITUSSIN DM) 100-10 MG/5ML syrup Take 5 mLs by mouth every 4 (four) hours as needed for cough.    . lamoTRIgine (LAMICTAL) 25 MG tablet Take 25 mg by mouth daily.    . metroNIDAZOLE (METROGEL) 0.75 % gel Apply 1 application topically daily. **Thin layer applied to the right cheek area    . pantoprazole (PROTONIX) 20 MG tablet Take 1 tablet (20 mg total) by mouth 2 (two) times daily. 60 tablet 0  . senna-docusate (SENOKOT-S) 8.6-50 MG per tablet Take 1 tablet by mouth at bedtime as needed for mild constipation.    . simvastatin (ZOCOR) 20 MG tablet Take 1 tablet (20 mg total) by mouth daily. (Patient taking differently: Take 20 mg by mouth every evening. ) 30 tablet 5  . traMADol (ULTRAM) 50 MG tablet Take 1 tablet (50 mg total) by mouth every 6 (six) hours as needed.  15 tablet 0    Physical Exam: Filed Vitals:   11/06/15 0814 11/06/15 1200  BP:  91/63  Pulse:    Temp: 97.9 F (36.6 C) 99.2 F (37.3 C)  Resp:    Patient with baseline confusion. Non-verbal communication Right leg: compartments soft/NT No laceration/abrasion Hip remains flexed/rotated.  Unable/unwilling to extend hip/leg Left - no pain with ROM/palpation 1+ DP/PT pulses  Image: Ct Head Wo Contrast  10/22/2015  CLINICAL DATA:  Code stroke EXAM: CT HEAD WITHOUT CONTRAST TECHNIQUE: Contiguous axial images were obtained from the base of the skull through the vertex without intravenous contrast. COMPARISON:  03/16/2015 FINDINGS: There is prominence of the sulci and ventricles identified compatible with brain  atrophy. Chronic lacunar infarct within the right centrum semiovale is identified and appears similar to previous exam. Mild low attenuation throughout the subcortical and periventricular white matter is identified compatible with chronic microvascular disease. No evidence for acute intracranial hemorrhage, cortical infarct or mass. No abnormal extra-axial fluid collections noted. The paranasal sinuses and the mastoid air cells are clear. The calvarium is intact. IMPRESSION: 1. No acute intracranial abnormalities. 2. Chronic small vessel ischemic disease and brain atrophy. These results were called by telephone at the time of interpretation on 10/22/2015 at 9:32 am to Dr. [name] , who verbally acknowledged these results. Electronically Signed   By: Kerby Moors M.D.   On: 10/22/2015 09:32   Ct Angio Chest Pe W/cm &/or Wo Cm  10/30/2015  CLINICAL DATA:  75 year old female with history of chest pain. History of lung cancer status post lobectomy in 2013. EXAM: CT ANGIOGRAPHY CHEST WITH CONTRAST TECHNIQUE: Multidetector CT imaging of the chest was performed using the standard protocol during bolus administration of intravenous contrast. Multiplanar CT image reconstructions and MIPs were obtained to evaluate the vascular anatomy. CONTRAST:  190m OMNIPAQUE IOHEXOL 350 MG/ML SOLN COMPARISON:  Chest CT 09/02/2015. FINDINGS: Comment: Study is severely limited by a large amount of patient respiratory motion. Because of this, accurate assessment for distal segmental and subsegmental sized emboli is not possible. Mediastinum/Lymph Nodes: Despite the limitations of today's examination, there are no central, lobar or proximal segmental sized filling defects within the pulmonary arteries to suggest pulmonary embolism. Distal segmental and subsegmental sized vessels cannot be accurately assessed secondary to extensive patient respiratory motion. Heart size is normal. There is no significant pericardial fluid,  thickening or pericardial calcification. There is atherosclerosis of the thoracic aorta, the great vessels of the mediastinum and the coronary arteries, including calcified atherosclerotic plaque in the left main, left anterior descending, left circumflex and right coronary arteries. Severe calcifications of the mitral annulus. Shift of cardiomediastinal structures in the left hemithorax related to prior left upper lobectomy and associated volume loss. Multiple borderline enlarged and minimally enlarged mediastinal and hilar lymph nodes are noted, measuring up to 11 mm in short axis in the right paratracheal nodal station. Esophagus is unremarkable in appearance. No axillary lymphadenopathy. Lungs/Pleura: Assessment for small pulmonary nodules is not possible secondary to extensive respiratory motion on today's examination. There are several small pulmonary nodules which are generally unchanged compared to prior examinations, however, the largest pulmonary nodule in the inferior aspect of the right upper lobe associated with the minor fissure (image 43 of series 6) has enlarged compared to prior studies and is more solid in appearance, currently measuring 1.0 x 1.3 cm (previously 12 x 9 mm on 09/02/2015, but 6 mm on 02/12/2014). There is again a background of patchy areas of ground-glass attenuation,  septal thickening and cylindrical/ varicose bronchiectasis throughout these lungs bilaterally, most evident in the lower lobes (particularly in the left lower lobe). No associated honeycombing confidently identified at this time. No acute consolidative airspace disease. No pleural effusions. Upper Abdomen: Unremarkable. Musculoskeletal/Soft Tissues: There are no aggressive appearing lytic or blastic lesions noted in the visualized portions of the skeleton. Multiple old healed right-sided rib fractures incidentally noted. Review of the MIP images confirms the above findings. IMPRESSION: 1. Limited study demonstrating no  central, lobar or proximal segmental sized pulmonary embolism. More distal segmental and subsegmental sized emboli cannot be excluded secondary to extensive patient respiratory motion. 2. Multiple small pulmonary nodules again noted, generally stable compared to prior studies with exception of mild enlargement of a 10 x 13 mm right upper lobe nodule, as discussed above. 3. Findings in the lungs again suggestive of underlying interstitial lung disease. 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 4. Atherosclerosis, including left main and 3 vessel coronary artery disease. Assessment for potential risk factor modification, dietary therapy or pharmacologic therapy may be warranted, if clinically indicated. 5. Status post left upper lobectomy. Electronically Signed   By: Vinnie Langton M.D.   On: 10/30/2015 18:32   Mr Brain Wo Contrast  10/22/2015  CLINICAL DATA:  Acute onset of left-sided weakness.  Stroke. EXAM: MRI HEAD WITHOUT CONTRAST TECHNIQUE: Multiplanar, multiecho pulse sequences of the brain and surrounding structures were obtained without intravenous contrast. COMPARISON:  Head CT same day.  MRI 09/19/2011. FINDINGS: The study does suffer from motion degradation. Diffusion imaging does not show any acute or subacute infarction. There is generalized brain atrophy. There chronic small-vessel changes affecting the pons. No cerebellar insult. The cerebral hemispheres show moderate changes of chronic small vessel disease within the deep and subcortical white matter. No large vessel territory infarction. No mass lesion, hemorrhage, hydrocephalus or extra-axial collection. No pituitary mass. No inflammatory sinus disease. No skull or skullbase lesion. Electronically Signed   By: Nelson Chimes M.D.   On: 10/22/2015 17:43   US Carotid Bilateral  11/05/2015  CLINICAL DATA:  Recent right femoral fracture and history  of TIAs EXAM: BILATERAL CAROTID DUPLEX ULTRASOUND TECHNIQUE: Pearline Cables scale imaging, color Doppler and duplex ultrasound were performed of bilateral carotid and vertebral arteries in the neck. COMPARISON:  02/06/2013 FINDINGS: Criteria: Quantification of carotid stenosis is based on velocity parameters that correlate the residual internal carotid diameter with NASCET-based stenosis levels, using the diameter of the distal internal carotid lumen as the denominator for stenosis measurement. The following velocity measurements were obtained: RIGHT ICA:  118/20 cm/sec CCA:  678/93 cm/sec SYSTOLIC ICA/CCA RATIO:  0.6 DIASTOLIC ICA/CCA RATIO:  0.8 ECA:  102 cm/sec LEFT ICA:  141/25 cm/sec CCA:  810/17 cm/sec SYSTOLIC ICA/CCA RATIO:  1.3 DIASTOLIC ICA/CCA RATIO:  1.5 ECA:  119 cm/sec RIGHT CAROTID ARTERY: Grayscale images demonstrate intimal thickening within the common carotid artery as well as plaque formation in the carotid bulb and extending into the proximal internal carotid artery. The waveforms, velocities and flow velocity ratios show no evidence of focal hemodynamically significant stenosis. RIGHT VERTEBRAL ARTERY:  Antegrade in nature. LEFT CAROTID ARTERY: Intimal thickening is noted within the common carotid artery on the left. Plaque formation in the carotid bulb and extending into the proximal internal carotid artery is noted. The waveforms, velocities and flow velocity ratios again demonstrate a stenosis in the 50-69% range likely in the lower end of that spectrum. LEFT  VERTEBRAL ARTERY:  Antegrade in nature. 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 No significant stenosis on the right is noted. Electronically Signed   By: Inez Catalina M.D.   On: 11/05/2015 11:50   Dg Chest Portable 1 View  11/04/2015  CLINICAL DATA:  Status post fall from wheelchair. Right shoulder pain. Personal history of lung cancer status post left upper lobectomy. Initial  encounter. EXAM: PORTABLE CHEST 1 VIEW COMPARISON:  Chest radiograph and CTA of the chest performed 10/30/2015 FINDINGS: The appearance of the lungs is stable from the prior study, reflecting diffuse interstitial lung disease with fibrotic change, worse at the left lung base. Known small pulmonary nodules are not well characterized on radiograph. No definite pleural effusion or pneumothorax is seen. The cardiomediastinal silhouette is borderline normal in size. No acute osseous abnormalities are seen. IMPRESSION: 1. Appearance of the lungs is stable from the prior study, reflecting diffuse interstitial lung disease with fibrotic change, worse at the left lung base. Known small pulmonary nodules are not well characterized on radiograph. 2. No displaced rib fracture seen. Electronically Signed   By: Garald Balding M.D.   On: 11/04/2015 23:50   Dg Chest Port 1 View  10/30/2015  CLINICAL DATA:  75 year old female with chest pain for 2 days. EXAM: PORTABLE CHEST 1 VIEW COMPARISON:  05/16/2015 and prior exams. FINDINGS: The cardiomediastinal silhouette is unchanged. Diffuse chronic interstitial opacities are again identified, possibly slightly increased from the prior study. There is no evidence pneumothorax or pleural effusion. Left lung volume loss again noted. No acute bony abnormalities identified. IMPRESSION: Interstitial opacities which may be slightly increased the prior study. This may represent acute infection/edema superimposed on chronic fibrosis. Electronically Signed   By: Margarette Canada M.D.   On: 10/30/2015 15:30   Dg Shoulder Right Port  11/04/2015  CLINICAL DATA:  Fall from wheelchair.  RIGHT shoulder pain EXAM: PORTABLE RIGHT SHOULDER - 2+ VIEW COMPARISON:  None. FINDINGS: Glenohumeral joint is intact. No evidence of scapular fracture or humeral fracture. The acromioclavicular joint is intact. IMPRESSION: No fracture or dislocation. Electronically Signed   By: Suzy Bouchard M.D.   On: 11/04/2015  23:49   Dg Knee Complete 4 Views Right  11/04/2015  CLINICAL DATA:  Fall from wheelchair at nursing home prior to arrival, now with right lower extremity pain. Right hip pain radiating to the knee. Initial encounter. EXAM: RIGHT KNEE - COMPLETE 4+ VIEW COMPARISON:  None. FINDINGS: Note fracture or dislocation. 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 Detailed evaluation partially obscured related to positioning. No large joint effusion. Vascular calcifications are seen. IMPRESSION: 1. No acute fracture or dislocation. 2. 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 Electronically Signed   By: Jeb Levering M.D.   On: 11/04/2015 19:19   Dg Hip Unilat With Pelvis 2-3 Views Right  11/04/2015  CLINICAL DATA:  Pt fell from wheelchair at nursing home prior to arrival to Radiology. Severe pain in Rt hip with radiation into Rt knee. EXAM: DG HIP (WITH OR WITHOUT PELVIS) 2-3V RIGHT COMPARISON:  None. FINDINGS: Intertrochanteric fracture of the proximal RIGHT femur with varus angulation. There is superior migration of the distal fracture fragment. No dislocation. IMPRESSION: RIGHT intertrochanteric femur fracture. Electronically Signed   By: Suzy Bouchard M.D.   On: 11/04/2015 19:20    A/P:  Patient s/p fall 2 days ago with deformity of right leg and significant pan. Recent stroke with residual  neuro deficits Patient limited ambulation baseline.   [facility] for medical management of new cardiac issues Right hip fracture noted Discussed case with Dr Lyla Glassing  Plan on CT scan of right hip to better evaluate fracture pattern Once cleared by cardiology plan on definitive fracture management

## 2015-11-09 ENCOUNTER — Encounter (HOSPITAL_COMMUNITY): Payer: Self-pay | Admitting: Orthopedic Surgery

## 2015-11-09 DIAGNOSIS — D62 Acute posthemorrhagic anemia: Secondary | ICD-10-CM | POA: Diagnosis not present

## 2015-11-09 LAB — CBC
HCT: 17.7 % — ABNORMAL LOW (ref 36.0–46.0)
Hemoglobin: 5.9 g/dL — CL (ref 12.0–15.0)
MCH: 33 pg (ref 26.0–34.0)
MCHC: 33.3 g/dL (ref 30.0–36.0)
MCV: 98.9 fL (ref 78.0–100.0)
PLATELETS: 155 10*3/uL (ref 150–400)
RBC: 1.79 MIL/uL — ABNORMAL LOW (ref 3.87–5.11)
RDW: 13.2 % (ref 11.5–15.5)
WBC: 7.6 10*3/uL (ref 4.0–10.5)

## 2015-11-09 LAB — BASIC METABOLIC PANEL
ANION GAP: 8 (ref 5–15)
BUN: 17 mg/dL (ref 6–20)
CALCIUM: 7.8 mg/dL — AB (ref 8.9–10.3)
CO2: 19 mmol/L — ABNORMAL LOW (ref 22–32)
Chloride: 113 mmol/L — ABNORMAL HIGH (ref 101–111)
Creatinine, Ser: 1.27 mg/dL — ABNORMAL HIGH (ref 0.44–1.00)
GFR calc Af Amer: 47 mL/min — ABNORMAL LOW (ref >60)
GFR, EST NON AFRICAN AMERICAN: 41 mL/min — AB (ref >60)
GLUCOSE: 109 mg/dL — AB (ref 65–99)
Potassium: 4 mmol/L (ref 3.5–5.1)
SODIUM: 140 mmol/L (ref 135–145)

## 2015-11-09 LAB — PREPARE RBC (CROSSMATCH)

## 2015-11-09 MED ORDER — METOPROLOL TARTRATE 1 MG/ML IV SOLN
2.5000 mg | Freq: Four times a day (QID) | INTRAVENOUS | Status: DC
  Administered 2015-11-09 – 2015-11-10 (×3): 2.5 mg via INTRAVENOUS
  Filled 2015-11-09 (×3): qty 5

## 2015-11-09 MED ORDER — METOPROLOL TARTRATE 1 MG/ML IV SOLN
2.5000 mg | Freq: Four times a day (QID) | INTRAVENOUS | Status: DC
  Administered 2015-11-09: 2.5 mg via INTRAVENOUS
  Filled 2015-11-09: qty 5

## 2015-11-09 MED ORDER — SODIUM CHLORIDE 0.9 % IV SOLN
Freq: Once | INTRAVENOUS | Status: AC
  Administered 2015-11-09: 250 mL via INTRAVENOUS

## 2015-11-09 MED ORDER — MORPHINE SULFATE (PF) 2 MG/ML IV SOLN
1.0000 mg | INTRAVENOUS | Status: DC | PRN
  Administered 2015-11-09 – 2015-11-11 (×8): 1 mg via INTRAVENOUS
  Filled 2015-11-09 (×10): qty 1

## 2015-11-09 MED ORDER — ASPIRIN EC 325 MG PO TBEC: 325.0000 mg | DELAYED_RELEASE_TABLET | Freq: Every day | ORAL | Status: DC

## 2015-11-09 NOTE — Clinical Documentation Improvement (Signed)
Orthopedic  Abnormal Lab/Test Results:  11/09/15: H/H= 5.9/17.7; 11/04/15: H/H= 12.1/35.9  Possible Clinical Conditions associated with below indicators   Acute Blood Loss Anemia  Chronic Anemia  Acute on Chronic Anemia  Other Condition  Cannot Clinically Determine   Supporting Information: 11/08/15: EBL= 150 cc  Treatment Provided: Order to transfuse 2 units PRBC's.   Please exercise your independent, professional judgment when responding. A specific answer is not anticipated or expected.   Thank You,  Rolm Gala, RN, [place] [phone]

## 2015-11-09 NOTE — Evaluation (Signed)
Physical Therapy Evaluation Patient Details Name: Stephanie [name] MRN: [id number] DOB: 11/11/1940 Today's Date: 11/09/2015   History of Present Illness  Pt was admitted with R hip fx after falling from w/c at her SNF. S/p IM nail with post op anemia and afib with RVR complicating hospital course. 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  Clinical Impression  Patient is s/p above surgery resulting in functional limitations due to the deficits listed below (see PT Problem List). Ms. Tayler was upset and moaning during session but tolerated sitting EOB for ~10 minutes w/ min guard>max assist.  Pt is from SNF where she was able to pivot to [facility] w/ assist.  Will need SNF at d/c. Patient will benefit from skilled PT to increase their independence and safety with mobility to allow discharge to the venue listed below.      Follow Up Recommendations SNF;Supervision/Assistance - 24 hour    Equipment Recommendations  Other (comment) (TBD at next venue of care)    Recommendations for Other Services       Precautions / Restrictions Precautions Precautions: Fall Restrictions Weight Bearing Restrictions: Yes RLE Weight Bearing: Touchdown weight bearing (30%) RLE Partial Weight Bearing Percentage or Pounds: 30      Mobility  Bed Mobility Overal bed mobility: Needs Assistance Bed Mobility: Rolling;Sidelying to Sit;Sit to Supine Rolling: +2 for physical assistance;Total assist Sidelying to sit: +2 for physical assistance;Total assist   Sit to supine: +2 for safety/equipment;Total assist   General bed mobility comments: assist for all aspects of bed mobility  Transfers                 General transfer comment: RN requesting pt remain in bed as she was receiving blood.  Ambulation/Gait                Stairs            Wheelchair Mobility    Modified Rankin (Stroke Patients Only)       Balance Overall balance assessment: Needs  assistance Sitting-balance support: Feet supported;Single extremity supported Sitting balance-Leahy Scale: Poor Sitting balance - Comments: min guard to max assist x 10 minutes Postural control: Posterior lean                                   Pertinent Vitals/Pain Pain Assessment: Faces Faces Pain Scale: Hurts whole lot Pain Location: R hip Pain Descriptors / Indicators: Moaning Pain Intervention(s): Limited activity within patient's tolerance;Monitored during session;Premedicated before session;Repositioned;Ice applied    Home Living Family/patient expects to be discharged to:: Skilled nursing facility                      Prior Function Level of Independence: Needs assistance   Gait / Transfers Assistance Needed: pt transferred with one person to a w/c, able to propel manual w/c with LEs in facility  ADL's / Homemaking Assistance Needed: dependent in all ADL per daughter        Hand Dominance   Dominant Hand: Right    Extremity/Trunk Assessment   Upper Extremity Assessment: RUE deficits/detail RUE Deficits / Details: residual weakness from prior CVA         Lower Extremity Assessment: Defer to PT evaluation RLE Deficits / Details: increased tone    Cervical / Trunk Assessment: Kyphotic  Communication   Communication: Expressive difficulties  Cognition Arousal/Alertness: Awake/alert (although keeping eyes closed most of session) Behavior During Therapy: Flat affect Overall Cognitive Status: History of cognitive impairments - at baseline                      General Comments      Exercises General Exercises - Lower Extremity Ankle Circles/Pumps: PROM;Both;10 reps;AAROM;Seated Long Arc Quad: PROM;AAROM;Both;10 reps;Seated      Assessment/Plan    PT Assessment Patient needs continued PT services  PT Diagnosis Difficulty walking;Generalized weakness;Acute pain;Altered mental status;Hemiplegia dominant side   PT Problem  List Decreased strength;Decreased range of motion;Decreased activity tolerance;Decreased balance;Decreased mobility;Decreased coordination;Decreased cognition;Decreased knowledge of use of DME;Decreased safety awareness;Decreased knowledge of precautions;Pain  PT Treatment Interventions DME instruction;Gait training;Functional mobility training;Therapeutic activities;Therapeutic exercise;Balance training;Neuromuscular re-education;Cognitive remediation;Patient/family education;Wheelchair mobility training;Modalities   PT Goals (Current goals can be found in the Care Plan section) Acute Rehab PT Goals Patient Stated Goal: daughter would like to be able to take pt riding in the car PT Goal Formulation: With family Time For Goal Achievement: 11/30/15 Potential to Achieve Goals: Fair    Frequency Min 2X/week   Barriers to discharge        Co-evaluation PT/OT/SLP Co-Evaluation/Treatment: Yes Reason for Co-Treatment: Complexity of the patient's impairments (multi-system involvement);Necessary to address cognition/behavior during functional activity;For patient/therapist safety PT goals addressed during session: Mobility/safety with mobility;Balance;Proper use of DME;Strengthening/ROM OT goals addressed during session: ADL's and self-care       End of Session   Activity Tolerance: Patient limited by pain;Patient limited by fatigue Patient left: in bed;with call bell/phone within reach;with bed alarm set;with family/visitor present Nurse Communication: Mobility status;Other (comment);Need for lift equipment;Precautions;Weight bearing status (speech consult once pt more alert)         Time: 0051-1021 PT Time Calculation (min) (ACUTE ONLY): 33 min   Charges:   PT Evaluation $PT Eval High Complexity: 1 Procedure     PT G Codes:       Joslyn Hy PT, DPT [phone] Pager: [phone] 11/09/2015, 4:20 PM

## 2015-11-09 NOTE — Evaluation (Signed)
Occupational Therapy Evaluation Patient Details Name: Stephanie [name] MRN: [id number] DOB: [birth year] Today's Date: 11/09/2015    History of Present Illness Pt was admitted with R hip fx after falling from w/c at her SNF.  S/p IM nail with post op anemia and afib with RVR complicating hospital course. 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   Clinical Impression   Pt is dependent in all ADL at her baseline and uses a w/c for mobility which she propels with her feet.  Pt currently requires +2 total assist for bed mobility and min guard to max assist for sitting EOB x 10 minutes.  Plan is for pt to return to SNF. Will defer further OT to the discretion of SNF.    Follow Up Recommendations  SNF;Supervision/Assistance - 24 hour    Equipment Recommendations       Recommendations for Other Services       Precautions / Restrictions Precautions Precautions: Fall Restrictions Weight Bearing Restrictions: Yes RLE Weight Bearing: Touchdown weight bearing (30%)      Mobility Bed Mobility   Bed Mobility: Rolling;Sidelying to Sit;Sit to Supine Rolling: +2 for physical assistance;Total assist Sidelying to sit: +2 for physical assistance;Total assist   Sit to supine: +2 for safety/equipment;Total assist   General bed mobility comments: assist for all aspects of bed mobility  Transfers                 General transfer comment: RN requesting pt remain in bed as she was receiving blood.    Balance Overall balance assessment: Needs assistance Sitting-balance support: Feet supported;Single extremity supported Sitting balance-Leahy Scale: Poor Sitting balance - Comments: min guard to max assist x 10 minutes                                    ADL Overall ADL's : At baseline                                             Vision     Perception     Praxis      Pertinent Vitals/Pain Pain Assessment:  Faces Faces Pain Scale: Hurts whole lot Pain Location: R hip Pain Descriptors / Indicators: Moaning Pain Intervention(s): Limited activity within patient's tolerance;Monitored during session;Premedicated before session;Repositioned;Ice applied     Hand Dominance Right   Extremity/Trunk Assessment Upper Extremity Assessment Upper Extremity Assessment: RUE deficits/detail RUE Deficits / Details: residual weakness from prior CVA RUE Coordination: decreased fine motor;decreased gross motor   Lower Extremity Assessment Lower Extremity Assessment: Defer to PT evaluation       Communication Communication Communication: Expressive difficulties   Cognition Arousal/Alertness: Awake/alert (although keeping eyes closed most of session) Behavior During Therapy: Flat affect Overall Cognitive Status: History of cognitive impairments - at baseline                     General Comments       Exercises       Shoulder Instructions      Home Living Family/patient expects to be discharged to:: Skilled nursing facility  Prior Functioning/Environment Level of Independence: Needs assistance  Gait / Transfers Assistance Needed: pt transferred with one person to a w/c, able to propel manual w/c with LEs in facility ADL's / Homemaking Assistance Needed: dependent in all ADL per daughter Communication / Swallowing Assistance Needed: baseline aphasia      OT Diagnosis: Generalized weakness;Cognitive deficits;Acute pain   OT Problem List:     OT Treatment/Interventions:      OT Goals(Current goals can be found in the care plan section) Acute Rehab OT Goals Patient Stated Goal: daughter would like to be able to take pt riding in the car  OT Frequency:     Barriers to D/C:            Co-evaluation PT/OT/SLP Co-Evaluation/Treatment: Yes Reason for Co-Treatment: Complexity of the patient's impairments (multi-system  involvement);Necessary to address cognition/behavior during functional activity;For patient/therapist safety PT goals addressed during session: Mobility/safety with mobility;Balance;Proper use of DME;Strengthening/ROM OT goals addressed during session: ADL's and self-care      End of Session Nurse Communication: Mobility status  Activity Tolerance: Patient limited by pain Patient left: in bed;with call bell/phone within reach;with bed alarm set;with family/visitor present   Time: 1347-1420 OT Time Calculation (min): 33 min Charges:  OT General Charges $OT Visit: 1 Procedure OT Evaluation $OT Eval High Complexity: 1 Procedure G-Codes:    Malka So 11/09/2015, 3:57 PM  [phone]

## 2015-11-09 NOTE — Progress Notes (Signed)
Subjective:  Patient drowsy and sleepy tolerated right intertrochanteric intramedullary nail yesterday. Had brief episodes of nonsustained A. fib with RVR. Has not taken by mouth amiodarone. Objective:  Vital Signs in the last 24 hours: Temp:  [98 F (36.7 C)-99.7 F (37.6 C)] 99 F (37.2 C) (01/09 0933) Pulse Rate:  [85-107] 103 (01/09 0933) Resp:  [15-33] 22 (01/09 0933) BP: (86-152)/(45-94) 116/47 mmHg (01/09 0932) SpO2:  [94 %-100 %] 96 % (01/09 0933) Arterial Line BP: (135-183)/(45-65) 149/56 mmHg (01/09 0933)  Intake/Output from previous day: 01/08 0701 - 01/09 0700 In: 3225 [I.V.:3225] Out: 1945 [IRSWN:4627; Blood:150] Intake/Output from this shift: Total I/O In: 325 [Blood:325] Out: 100 [Urine:100]  Physical Exam: Neck: no adenopathy, no carotid bruit, no JVD and supple, symmetrical, trachea midline Lungs: Clear to auscultation anteriorly Heart: regular rate and rhythm, S1, S2 normal and 2/6 systolic murmur noted Abdomen: soft, non-tender; bowel sounds normal; no masses,  no organomegaly Extremities: Right hip surgical site soft  Lab Results:  Recent Labs  11/09/15 0351  WBC 7.6  HGB 5.9*  PLT 155    Recent Labs  11/08/15 0539 11/09/15 0351  NA 140 140  K 4.2 4.0  CL 113* 113*  CO2 21* 19*  GLUCOSE 112* 109*  BUN 21* 17  CREATININE 1.38* 1.27*   No results for input(s): TROPONINI in the last 72 hours.  Invalid input(s): CK, MB Hepatic Function Panel No results for input(s): PROT, ALBUMIN, AST, ALT, ALKPHOS, BILITOT, BILIDIR, IBILI in the last 72 hours. No results for input(s): CHOL in the last 72 hours. No results for input(s): PROTIME in the last 72 hours.  Imaging: Imaging results have been reviewed and Dg Hip Operative Unilat With Pelvis Right  11/08/2015  CLINICAL DATA:  IM nail of right hip EXAM: OPERATIVE right HIP (WITH PELVIS IF PERFORMED) 2 VIEWS TECHNIQUE: Fluoroscopic spot image(s) were submitted for interpretation post-operatively.  COMPARISON:  11/04/2015 FINDINGS: Hip screw an IM nail reduces the intertrochanteric fracture involving the proximal right femur. The fracture fragments and hardware components are in anatomic alignment. No complications identified. IMPRESSION: 1. Status post ORIF of proximal femur fracture. Electronically Signed   By: Kerby Moors M.D.   On: 11/08/2015 11:04   Dg Hip Unilat With Pelvis 2-3 Views Right  11/08/2015  CLINICAL DATA:  Postop right hip pinning.  Confusion. EXAM: DG HIP (WITH OR WITHOUT PELVIS) 2-3V RIGHT COMPARISON:  Intraoperative fluoroscopic spot images from earlier same day. Comparison also made to earlier plain film of the right hip dated 11/04/2015. FINDINGS: Intra medullary rod with associated dynamic hip screw traverses the right femoral neck fracture site. Hardware appears intact and well aligned. Osseous alignment is anatomic. No surgical complicating features seen. IMPRESSION: IMPRESSION Status post fixation of the right femoral neck fracture. Hardware appears intact and well aligned. No surgical complicating feature. Electronically Signed   By: Franki Cabot M.D.   On: 11/08/2015 12:02    Cardiac Studies:  Assessment/Plan:  Right hip Complex fracture / pubic rami / right-sided sacral fracture status post fall status post intramedullary nail Recurrent paroxysmal A. fib flutter with 2-1 block converted to sinus rhythm chadsvac score of 8 Postop anemia Hypertrophic cardiomyopathy with SAM Severe mitral regurgitation and moderate tricuspid regurgitation Hypertension History of CVA in the past Hyperlipidemia Bilateral carotid artery disease Adenocarcinoma of the left lung Acute on chronc renal insufficiency seconary to hypotension Plan Lopressor 2.5 mg IV every 4-6 hours when necessary for heart rate above 100 Restart by mouth Lopressor and  amiodarone once more awake Agree with packed RBCs  LOS: 5 days    Charolette Forward 11/09/2015, 12:51 PM

## 2015-11-09 NOTE — Progress Notes (Signed)
   Subjective:  No events. Was spitting out meds yesterday.  Objective:   VITALS:   Filed Vitals:   11/09/15 0645 11/09/15 0700 11/09/15 0705 11/09/15 0706  BP:  119/67  120/59  Pulse:   100   Temp: 98.1 F (36.7 C)  98 F (36.7 C)   TempSrc: Axillary  Axillary   Resp: '26 16  28  '$ Height:      Weight:      SpO2:        Patient aphasic ABD soft Intact pulses distally Dorsiflexion/Plantar flexion intact Incision: dressing C/D/I Compartment soft Unable to test sensation due to MS  Lab Results  Component Value Date   WBC 7.6 11/09/2015   HGB 5.9* 11/09/2015   HCT 17.7* 11/09/2015   MCV 98.9 11/09/2015   PLT 155 11/09/2015   BMET    Component Value Date/Time   NA 140 11/09/2015 0351   K 4.0 11/09/2015 0351   CL 113* 11/09/2015 0351   CO2 19* 11/09/2015 0351   GLUCOSE 109* 11/09/2015 0351   BUN 17 11/09/2015 0351   CREATININE 1.27* 11/09/2015 0351   CALCIUM 7.8* 11/09/2015 0351   GFRNONAA 41* 11/09/2015 0351   GFRAA 47* 11/09/2015 0351     Assessment/Plan: 1 Day Post-Op   Principal Problem:   Hip fracture, right (HCC) Active Problems:   Adenocarcinoma of left lung, stage 1 (HCC)   Bilateral carotid artery disease (HCC)   Hyperlipidemia   Hypotension   CKD (chronic kidney disease), stage III   Sacral fracture, closed ([place])   Closed right hip fracture (HCC)   Atrial fibrillation (HCC)   right superior and inferior pubic rami   Fracture, intertrochanteric, right femur ([place])   TDWB RLE PT/OT for bed to chair xfers DVT ppx: ASA, cont plavix, SCDs PO pain control IV vanco x23 hrs SNF planning   Allye Hoyos, Horald Pollen 11/09/2015, 7:42 AM   Rod Can, MD Cell [phone]

## 2015-11-09 NOTE — Progress Notes (Signed)
PT Cancellation Note  Patient Details Name: Stephanie [name] MRN: [id number] DOB: 07/01/1941   Cancelled Treatment:    Reason Eval/Treat Not Completed: Medical issues which prohibited therapy (latest Hgb 5.9.  Pt receiving blood transfusion.)  PT will continue to follow acutely and will attempt to see pt again later today, time permitting.  Joslyn Hy PT, DPT [phone] Pager: [phone] 11/09/2015, 10:57 AM

## 2015-11-09 NOTE — Progress Notes (Signed)
[facility] PROGRESS NOTE   Stephanie [name] KDX:[id number] DOB: Jan 11, 1941 DOA: 11/04/2015 PCP: [name], MD  Summary 75 year old aphasic white female from skilled nursing facility who fell out of wheelchair sustained a right hip fracture and was admitted to [facility] on 1/4. She was also noted to have new A. fib with RVR. She was started on a Cardizem drip and briefly became hypotensive. Drip was subsequently stopped. Dr. Aline Brochure, orthopedics was consulted and felt that patient needed transfer to a higher level of care for anesthesia and cardiology coverage.  Hospitalist at [facility] felt she was medically optimized. She has no previous history of fracture nor cardiac issues. At baseline, she is able to maneuver fairly well in a wheelchair at skilled nursing facility.  Assessment/Plan: Principal Problem:   Hip fracture, right (HCC) Active Problems:   Adenocarcinoma of left lung, stage 1 (HCC)   Bilateral carotid artery disease (HCC)   Hyperlipidemia   Hypotension   CKD (chronic kidney disease), stage III   Sacral fracture, closed (HCC)   Closed right hip fracture (HCC)   Atrial fibrillation (HCC)   right superior and inferior pubic rami   Fracture, intertrochanteric, right femur (HCC)   Right hip Fracture S/p IM nail. Currently very sedated after receiving multiple doses dilaudid overnight. Will discontinue and avoid oversedation. As patient unable to take aspirin and Plavix given level of sedation, consider Lovenox, defer to Dr. Delfino Lovett. SCDs ordered, but patient reportedly too agitated earlier to keep them on.  Right sacral fracture/Right superior and inferior pubic rami fx seen on CT  New onset of paroxysmal A. 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 not alert enough to take pills. Dr. Terrence Dupont ordered IV metoprolol for now. CHADSVASC 5. Eventual anticoagulation per cardiology  Acute postoperative blood loss anemia: Getting second unit of  packed blood cells.  will also Hemoccult stools.   Severe MR  HOCM  TIA/stroke -Has history of stroke in 2012, recent history of TIA in December 2016. -Patient is on Plavix, held for the upcoming surgery Aphasic since 2/12  Acute renal failure on CKD stage III.  creatinine stable  History of lung cancer stage I History of lung cancer diagnosed January 2013 with surgical resection, was T2a, pN0. Continue Iressa, per last nephrology note question of progression of adenocarcinoma but not proven by biopsy.  HLD -Continue with statin.  HTN  Code Status: DNR Family Communication: multiple family at bedside  Disposition Plan: Back to skilled nursing facility after surgery. Diet: D1  Consultants:  Ortho  cardiology  Procedures:  None  Antibiotics:  None  Subjective: Unable.   Objective: Filed Vitals:   11/09/15 0932 11/09/15 0933  BP: 116/47   Pulse:  103  Temp:  99 F (37.2 C)  Resp: 20 22    Intake/Output Summary (Last 24 hours) at 11/09/15 1326 Last data filed at 11/09/15 0933  Gross per 24 hour  Intake   2050 ml  Output   1350 ml  Net    700 ml   Filed Weights   11/05/15 0110 11/05/15 0500 11/05/15 2224  Weight: 65.7 kg (144 lb 13.5 oz) 65.7 kg (144 lb 13.5 oz) 70 kg (154 lb 5.2 oz)    telemetry: NSR  Exam: General: somnolent. Briefly arousable. 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 No results for input(s): AMMONIA in the last 168 hours. CBC:  Recent Labs Lab 11/03/15 1113 11/04/15 2000 11/09/15 0351  WBC 6.1 14.2* 7.6  NEUTROABS 4.3 12.3*  --   HGB 12.6 12.1 5.9*  HCT 38.2 35.9* 17.7*  MCV 99.7 97.6 98.9  PLT 205 224 155   Cardiac Enzymes: No results for input(s): CKTOTAL, CKMB, CKMBINDEX, TROPONINI in the last 168 hours. BNP (last 3 results) No results for input(s): BNP in the last 8760 hours.  ProBNP (last 3 results) No results for input(s): PROBNP in the last 8760 hours.  CBG: No results for input(s): GLUCAP in the last 168 hours.  Micro No results found for this or any previous visit (from the past 240 hour(s)).   Studies: Dg Hip Operative Unilat With Pelvis Right  11/08/2015  CLINICAL DATA:  IM nail of right hip EXAM: OPERATIVE right HIP (WITH PELVIS IF PERFORMED) 2 VIEWS TECHNIQUE: Fluoroscopic spot image(s) were submitted for interpretation post-operatively. COMPARISON:  11/04/2015 FINDINGS: Hip screw an IM nail reduces the intertrochanteric fracture involving the proximal right femur. The fracture fragments and hardware components are in anatomic alignment. No complications identified. IMPRESSION: 1. Status post ORIF of proximal femur fracture. Electronically Signed   By: Kerby Moors M.D.   On: 11/08/2015 11:04   Dg Hip Unilat With Pelvis 2-3 Views Right  11/08/2015  CLINICAL DATA:  Postop right hip pinning.  Confusion. EXAM: DG HIP (WITH OR WITHOUT PELVIS) 2-3V RIGHT COMPARISON:  Intraoperative fluoroscopic spot images from earlier same day. Comparison also made to earlier plain film of the right hip dated 11/04/2015. FINDINGS: Intra  medullary rod with associated dynamic hip screw traverses the right femoral neck fracture site. Hardware appears intact and well aligned. Osseous alignment is anatomic. No surgical complicating features seen. IMPRESSION: IMPRESSION Status post fixation of the right femoral neck fracture. Hardware appears intact and well aligned. No surgical complicating feature. Electronically Signed   By: Franki Cabot M.D.   On: 11/08/2015 12:02    Scheduled Meds: . amiodarone  400 mg Oral Daily  . antiseptic oral rinse  7 mL Mouth Rinse BID  . aspirin EC  325 mg Oral Q breakfast  . Chlorhexidine Gluconate Cloth  6 each Topical Q0600  . clopidogrel  75 mg Oral Daily  . DULoxetine  60 mg Oral Daily  . lamoTRIgine  25 mg Oral Daily  . metoprolol  2.5 mg Intravenous 4 times per day  . mupirocin ointment  1 application Nasal BID  . pantoprazole  40 mg Oral Daily  . simvastatin  20 mg Oral QPM  . sodium chloride  3 mL Intravenous Q12H   Continuous Infusions: . sodium chloride 100 mL/hr at  11/08/15 0056   Time spent: 25 minutes  [facility] [email or web address], password [facility] 11/09/2015, 1:26 PM  LOS: 5 days

## 2015-11-09 NOTE — Care Management Note (Addendum)
Case Management Note  Patient Details  Name: Stephanie [name] MRN: [id number] Date of Birth: August 29, 1941  Subjective/Objective:                 Admitted from skilled nursing facility who fell out of wheelchair sustained a right hip fracture. She was also noted to have new A. fib with RVR. . At baseline, she is able to maneuver fairly well in a wheelchair at skilled nursing facility.   S/p IM nail 11/08/15.  Action/Plan: Return to SNF-rehab.when medically stable. CM to f/u with discharge needs. Expected Discharge Date:                 Expected Discharge Plan:  [place] [facility])  In-House Referral:  Clinical Social Work  Discharge planning Services  CM Consult  Post Acute Care Choice:    Choice offered to:     DME Arranged:    DME Agency:     HH Arranged:    HH Agency:     Status of Service:  In process, will continue to follow  Medicare Important Message Given:    Date Medicare IM Given:    Medicare IM give by:    Date Additional Medicare IM Given:    Additional Medicare Important Message give by:     If discussed at [place] of Stay Meetings, dates discussed:    Additional Comments:  1/ 12 /17 Palliative care consult to establish goals of care  [name] (Daughter)[phone],  [facility] (Daughter) [phone]  [name], [place] [phone] 11/09/2015, 4:10 PM

## 2015-11-10 LAB — TYPE AND SCREEN
ABO/RH(D): O POS
ANTIBODY SCREEN: NEGATIVE
Unit division: 0
Unit division: 0

## 2015-11-10 LAB — BASIC METABOLIC PANEL
Anion gap: 12 (ref 5–15)
BUN: 17 mg/dL (ref 6–20)
CALCIUM: 8.1 mg/dL — AB (ref 8.9–10.3)
CHLORIDE: 113 mmol/L — AB (ref 101–111)
CO2: 21 mmol/L — ABNORMAL LOW (ref 22–32)
CREATININE: 1.2 mg/dL — AB (ref 0.44–1.00)
GFR calc Af Amer: 51 mL/min — ABNORMAL LOW (ref >60)
GFR, EST NON AFRICAN AMERICAN: 44 mL/min — AB (ref >60)
Glucose, Bld: 98 mg/dL (ref 65–99)
Potassium: 4.1 mmol/L (ref 3.5–5.1)
SODIUM: 146 mmol/L — AB (ref 135–145)

## 2015-11-10 LAB — CBC
HCT: 31 % — ABNORMAL LOW (ref 36.0–46.0)
Hemoglobin: 10.3 g/dL — ABNORMAL LOW (ref 12.0–15.0)
MCH: 31.6 pg (ref 26.0–34.0)
MCHC: 33.2 g/dL (ref 30.0–36.0)
MCV: 95.1 fL (ref 78.0–100.0)
PLATELETS: 161 10*3/uL (ref 150–400)
RBC: 3.26 MIL/uL — ABNORMAL LOW (ref 3.87–5.11)
RDW: 17.2 % — AB (ref 11.5–15.5)
WBC: 9.7 10*3/uL (ref 4.0–10.5)

## 2015-11-10 MED ORDER — CLOPIDOGREL BISULFATE 75 MG PO TABS
75.0000 mg | ORAL_TABLET | Freq: Every day | ORAL | Status: DC
Start: 2015-11-11 — End: 2015-11-11
  Administered 2015-11-11: 75 mg
  Filled 2015-11-10: qty 1

## 2015-11-10 MED ORDER — METOPROLOL TARTRATE 1 MG/ML IV SOLN
5.0000 mg | Freq: Once | INTRAVENOUS | Status: AC
  Administered 2015-11-10: 5 mg via INTRAVENOUS
  Filled 2015-11-10: qty 5

## 2015-11-10 MED ORDER — SODIUM CHLORIDE 0.45 % IV SOLN
INTRAVENOUS | Status: DC
  Administered 2015-11-10 – 2015-11-11 (×3): via INTRAVENOUS

## 2015-11-10 MED ORDER — METOPROLOL TARTRATE 25 MG PO TABS
25.0000 mg | ORAL_TABLET | Freq: Two times a day (BID) | ORAL | Status: DC
  Administered 2015-11-11: 25 mg
  Filled 2015-11-10: qty 1

## 2015-11-10 MED ORDER — METOPROLOL TARTRATE 25 MG PO TABS: 25.0000 mg | ORAL_TABLET | Freq: Two times a day (BID) | ORAL | Status: DC

## 2015-11-10 MED ORDER — SODIUM CHLORIDE 0.9 % IV SOLN
Freq: Once | INTRAVENOUS | Status: AC
  Administered 2015-11-11: 23:00:00 via INTRAVENOUS

## 2015-11-10 MED ORDER — METOPROLOL TARTRATE 1 MG/ML IV SOLN
5.0000 mg | Freq: Four times a day (QID) | INTRAVENOUS | Status: DC | PRN
  Administered 2015-11-10 – 2015-11-11 (×3): 5 mg via INTRAVENOUS
  Filled 2015-11-10 (×4): qty 5

## 2015-11-10 MED ORDER — WHITE PETROLATUM GEL
Status: AC
  Filled 2015-11-10: qty 1

## 2015-11-10 MED ORDER — ASPIRIN 325 MG PO TABS
325.0000 mg | ORAL_TABLET | Freq: Every day | ORAL | Status: DC
  Administered 2015-11-11: 325 mg
  Filled 2015-11-10: qty 1

## 2015-11-10 MED ORDER — LORAZEPAM 2 MG/ML IJ SOLN
0.5000 mg | Freq: Once | INTRAMUSCULAR | Status: AC
  Administered 2015-11-10: 0.5 mg via INTRAVENOUS
  Filled 2015-11-10: qty 1

## 2015-11-10 MED ORDER — AMIODARONE HCL 200 MG PO TABS
400.0000 mg | ORAL_TABLET | Freq: Every day | ORAL | Status: DC
  Administered 2015-11-11: 400 mg
  Filled 2015-11-10: qty 2

## 2015-11-10 MED ORDER — JEVITY 1.2 CAL PO LIQD
1000.0000 mL | ORAL | Status: DC
  Filled 2015-11-10 (×3): qty 1000

## 2015-11-10 NOTE — Progress Notes (Signed)
[facility] PROGRESS NOTE   Stephanie [name] [id number] DOB: May 22, 1941 DOA: 11/04/2015 PCP: [name], MD  Summary 75 year old aphasic white female from skilled nursing facility who fell out of wheelchair sustained a right hip fracture and was admitted to [facility] on 1/4. She was also noted to have new A. fib with RVR. She was started on a Cardizem drip and briefly became hypotensive. Drip was subsequently stopped. Dr. Aline Brochure, orthopedics was consulted and felt that patient needed transfer to a higher level of care for anesthesia and cardiology coverage.  Transferred to [facility]. Cardiology consulted and had IM Nail 1/9  Assessment/Plan: Principal Problem:   Hip fracture, right (HCC) Active Problems:   Adenocarcinoma of left lung, stage 1 ([place])   Bilateral carotid artery disease (HCC)   Hyperlipidemia   Hypotension   CKD (chronic kidney disease), stage III   Sacral fracture, closed (HCC)   Closed right hip fracture (HCC)   Atrial fibrillation (HCC)   right superior and inferior pubic rami   Fracture, intertrochanteric, right femur (HCC)   Postoperative anemia due to acute blood loss   Right hip Fracture S/p IM nail.   Right sacral fracture/Right superior and inferior pubic rami fx seen on CT  New onset of paroxysmal A. 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 CHADSVASC 5. Eventual anticoagulation per cardiology. Will order prn labetalol  Acute postoperative blood loss anemia: s.p 2 units prbc   Severe MR  HOCM  TIA/stroke -Has history of stroke in 2012, recent history of TIA in December 2016. -Patient is on Plavix, held for the upcoming surgery Aphasic since 2/12  Acute renal failure on CKD stage III.  creatinine stable  History of lung cancer stage I History of lung cancer diagnosed January 2013 with surgical resection, was T2a, pN0. Continue Iressa, per last nephrology note question of progression of adenocarcinoma but not proven  by biopsy.  HLD -Continue with statin.  HTN  Dysphagia. Failed swallow eval. Will minimize sedating meds. Place NGT for TF and meds. Left multiple messages with family  Code Status: DNR Family Communication: left message Disposition Plan: Back to skilled nursing facility once stable  Consultants:  Ortho  cardiology  Procedures:  IM nail  Antibiotics:  None  Subjective: Unable.   Objective: Filed Vitals:   11/10/15 0815 11/10/15 1052  BP: 151/65   Pulse:    Temp: 99 F (37.2 C) 98.6 F (37 C)  Resp:      Intake/Output Summary (Last 24 hours) at 11/10/15 1214 Last data filed at 11/10/15 1052  Gross per 24 hour  Intake   1715 ml  Output   3350 ml  Net  -1635 ml   Filed Weights   11/05/15 0110 11/05/15 0500 11/05/15 2224  Weight: 65.7 kg (144 lb 13.5 oz) 65.7 kg (144 lb 13.5 oz) 70 kg (154 lb 5.2 oz)    telemetry: NSR  Exam: General: somnolent. arousable HEENT edentulous. 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 No results for input(s): AMMONIA in the last 168 hours. CBC:  Recent Labs Lab 11/04/15 2000  11/09/15 0351 11/10/15 0423  WBC 14.2* 7.6 9.7  NEUTROABS 12.3*  --   --   HGB 12.1 5.9* 10.3*  HCT 35.9* 17.7* 31.0*  MCV 97.6 98.9 95.1  PLT 224 155 161   Cardiac Enzymes: No results for input(s): CKTOTAL, CKMB, CKMBINDEX, TROPONINI in the last 168 hours. BNP (last 3 results) No results for input(s): BNP in the last 8760 hours.  ProBNP (last 3 results) No results for input(s): PROBNP in the last 8760 hours.  CBG: No results for input(s): GLUCAP in the last 168 hours.  Micro No results found for this or any previous visit (from the past 240 hour(s)).   Studies: No results found.  Scheduled Meds: . sodium chloride   Intravenous Once  . amiodarone  400 mg Oral Daily  . antiseptic oral rinse  7 mL Mouth Rinse BID  . aspirin EC  325 mg Oral Q breakfast  . clopidogrel  75 mg Oral Daily  . DULoxetine  60 mg Oral Daily  . lamoTRIgine  25 mg Oral Daily  . metoprolol tartrate  25 mg Oral BID  . mupirocin ointment  1 application Nasal BID  . pantoprazole  40 mg Oral Daily  . simvastatin  20 mg Oral QPM  . sodium chloride  3 mL Intravenous Q12H   Continuous Infusions: . sodium chloride 50 mL/hr at 11/10/15 4696   Time spent: 25 minutes  [facility] [email or web address], password [facility] 11/10/2015, 12:14 PM  LOS: 6 days

## 2015-11-10 NOTE — Progress Notes (Signed)
Subjective:  Awake in no acute distress Presently in sinus rhythm but  had paroxysmal A. fibillaton withRVR  Objective:  Vital Signs in the last 24 hours: Temp:  [98.5 F (36.9 C)-99.3 F (37.4 C)] 98.6 F (37 C) (01/10 1052) Pulse Rate:  [35-129] 99 (01/10 0400) Resp:  [14-25] 21 (01/10 0400) BP: (92-163)/(36-100) 151/65 mmHg (01/10 0815) SpO2:  [93 %-100 %] 93 % (01/10 0400) Arterial Line BP: (144)/(49) 144/49 mmHg (01/09 1325)  Intake/Output from previous day: 01/09 0701 - 01/10 0700 In: 1930.8 [I.V.:1270.8; Blood:660] Out: 1600 [Urine:1600] Intake/Output from this shift: Total I/O In: 109.2 [I.V.:109.2] Out: 2050 [Urine:2050]  Physical Exam: Neck: no adenopathy, no carotid bruit, no JVD and supple, symmetrical, trachea midline Lungs:  clear to auscultation anteriorly Heart: regular rate and rhythm, S1, S2 normal and  2/6 systolic murmur noted Abdomen: soft, non-tender; bowel sounds normal; no masses,  no organomegaly Extremities: extremities normal, atraumatic, no cyanosis or edema and surgical site dry  and soft  Lab Results:  Recent Labs  11/09/15 0351 11/10/15 0423  WBC 7.6 9.7  HGB 5.9* 10.3*  PLT 155 161    Recent Labs  11/09/15 0351 11/10/15 0423  NA 140 146*  K 4.0 4.1  CL 113* 113*  CO2 19* 21*  GLUCOSE 109* 98  BUN 17 17  CREATININE 1.27* 1.20*   No results for input(s): TROPONINI in the last 72 hours.  Invalid input(s): CK, MB Hepatic Function Panel No results for input(s): PROT, ALBUMIN, AST, ALT, ALKPHOS, BILITOT, BILIDIR, IBILI in the last 72 hours. No results for input(s): CHOL in the last 72 hours. No results for input(s): PROTIME in the last 72 hours.  Imaging: Imaging results have been reviewed and Dg Hip Unilat With Pelvis 2-3 Views Right  11/08/2015  CLINICAL DATA:  Postop right hip pinning.  Confusion. EXAM: DG HIP (WITH OR WITHOUT PELVIS) 2-3V RIGHT COMPARISON:  Intraoperative fluoroscopic spot images from earlier same day.  Comparison also made to earlier plain film of the right hip dated 11/04/2015. FINDINGS: Intra medullary rod with associated dynamic hip screw traverses the right femoral neck fracture site. Hardware appears intact and well aligned. Osseous alignment is anatomic. No surgical complicating features seen. IMPRESSION: IMPRESSION Status post fixation of the right femoral neck fracture. Hardware appears intact and well aligned. No surgical complicating feature. Electronically Signed   By: Franki Cabot M.D.   On: 11/08/2015 12:02    Cardiac Studies:  Assessment/Plan:  Right hip Complex fracture / pubic rami / right-sided sacral fracture status post fall status post intramedullary nail Recurrent paroxysmal A. fib flutter with 2-1 block converted to sinus rhythm chadsvac score of 8 Postop anemia Hypertrophic cardiomyopathy with SAM Severe mitral regurgitation and moderate tricuspid regurgitation Hypertension History of CVA in the past Hyperlipidemia Bilateral carotid artery disease Adenocarcinoma of the left lung Acute on chronc renal insufficiency seconary to hypotension  plan restart Lopressor 25 mg twice daily   LOS: 6 days    Charolette Forward 11/10/2015, 11:41 AM

## 2015-11-10 NOTE — Evaluation (Signed)
Clinical/Bedside Swallow Evaluation Patient Details  Name: Stephanie [name] MRN: [id number] Date of Birth: [birth year]  Today's Date: 11/10/2015 Time: SLP Start Time (ACUTE ONLY): 1345 SLP Stop Time (ACUTE ONLY): 1404 SLP Time Calculation (min) (ACUTE ONLY): 19 min  Past Medical History:  Past Medical History  Diagnosis Date  . CVA (cerebral infarction)     speech difficulities  . Stroke (HCC)   . Hypercholesteremia     Takes Zocor daily  . Hypertension     takes Atenolol and Lisinopril daily  . Lung mass     left upper lobe  . Arthritis     hands  . TIA (transient ischemic attack)     Sat before thanksgiving;impaired speech  . Pneumothorax   . Lung cancer (HCC)   . CKD (chronic kidney disease), stage III    Past Surgical History:  Past Surgical History  Procedure Laterality Date  . No past surgeries    . Lung biopsy  10/13/11    left  . Abdominal hysterectomy  1975  . Varicose vein surgery  33yrs ago  . Video assisted thoracoscopy  11/18/2011    Procedure: VIDEO ASSISTED THORACOSCOPY;  Surgeon: [name], MD;  Location: [facility] OR;  Service: Thoracic;  Laterality: Left;  . Lobectomy  11/18/2011    Procedure: LOBECTOMY;  Surgeon: [name], MD;  Location: [facility] OR;  Service: Thoracic;  Laterality: Left;  LEFT UPPER LOBECTOMY  . Breast biopsy  early 2000  . 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 Admitted from skilled nursing facility who fell out of wheelchair sustained a right hip fracture and was admitted to [facility] on 1/4. She was also noted to have new A. fib with RVR. She was started on a Cardizem drip and briefly became  hypotensive. Drip was subsequently stopped. Dr. Romeo Apple, orthopedics was consulted and felt that patient needed transfer to a higher level of care for anesthesia and cardiology coverage. 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 Pt has been seen by SLP in the past, recommended to consume a dys 3 diet and thin liquids, no significant dysphagia. Able to masticate without dentition.    Assessment / Plan / Recommendation Clinical Impression  Pt demonstrates severe dysphagia secondary to AMS. Pt is awake and moaning, but does not demonstrate any awareness of surroundings or therapeutic interventions. PO trials administered by spoon elicited no oral manipulation and were suctioned from mouth. Also attempted hand over hand assist bringing cup to lips, though pt did not pucker lips or respond. Pt is not appropriate for PO at this time and should be NPO until mentation improves. Given SLE assessment recommending Dys 3 solids and thin liquids in December at [facility], expect improvement in function when pt is alert. Suggest short term method of nutrition.     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 Admitted from skilled nursing facility who fell out of wheelchair sustained a right hip fracture and was admitted to [facility] on 1/4. She was also noted to have new A. fib with RVR. She was started on  a Cardizem drip and briefly became hypotensive. Drip was subsequently stopped. Dr. Romeo Apple, orthopedics was consulted and felt that patient needed transfer to a higher level of care for anesthesia and cardiology coverage. 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 Pt has been seen by SLP in the past, recommended to consume a dys 3 diet and thin liquids, no significant dysphagia. Able to masticate without dentition.  Type of Study: Bedside Swallow Evaluation Previous Swallow Assessment: none Diet Prior to this Study: Dysphagia 1 (puree);Thin liquids Temperature Spikes Noted: No Respiratory Status: Room air History of Recent Intubation: No Behavior/Cognition: Alert;Cooperative;Pleasant mood Oral Cavity Assessment: Dry Oral Care Completed by SLP: Yes Oral Cavity - Dentition: Edentulous Vision: Functional for self-feeding Self-Feeding Abilities: Total assist Patient Positioning: Upright in bed Baseline Vocal Quality: Normal (while moaning) Volitional Cough: Cognitively unable to elicit Volitional Swallow: Unable to elicit    Oral/Motor/Sensory Function Overall Oral Motor/Sensory Function: Other (comment) (uanble to follow commands)   Ice Chips     Thin Liquid Thin Liquid: Impaired Presentation: Cup Oral Phase Impairments: Poor awareness of bolus    Nectar Thick Nectar Thick Liquid: Impaired Presentation: Spoon Oral Phase Impairments: Poor awareness of bolus   Honey Thick Honey Thick Liquid: Not tested   Puree Puree: Impaired Presentation: Spoon Oral Phase Impairments: Poor awareness of bolus   Solid   GO    Solid: Not tested       Valentin Benney, Riley Nearing 11/10/2015,2:22 PM

## 2015-11-10 NOTE — Progress Notes (Addendum)
Contacted [facility]; RD states no placements are made after 4pm. This patient is on the list for Wed am. Family  updated on new plan.

## 2015-11-11 ENCOUNTER — Encounter (HOSPITAL_COMMUNITY)
Admission: AD | Admit: 2015-11-11 | Discharge: 2015-11-11 | Disposition: A | Discharge status: Home / Self Care | Payer: Medicare Other | Source: Skilled Nursing Facility | Attending: Internal Medicine | Admitting: Internal Medicine

## 2015-11-11 ENCOUNTER — Inpatient Hospital Stay (HOSPITAL_COMMUNITY): Payer: Medicare Other

## 2015-11-11 DIAGNOSIS — C3492 Malignant neoplasm of unspecified part of left bronchus or lung: Secondary | ICD-10-CM

## 2015-11-11 DIAGNOSIS — S72001A Fracture of unspecified part of neck of right femur, initial encounter for closed fracture: Secondary | ICD-10-CM

## 2015-11-11 DIAGNOSIS — N183 Chronic kidney disease, stage 3 (moderate): Secondary | ICD-10-CM

## 2015-11-11 LAB — BASIC METABOLIC PANEL
ANION GAP: 12 (ref 5–15)
BUN: 16 mg/dL (ref 6–20)
CHLORIDE: 110 mmol/L (ref 101–111)
CO2: 22 mmol/L (ref 22–32)
Calcium: 8.5 mg/dL — ABNORMAL LOW (ref 8.9–10.3)
Creatinine, Ser: 1.1 mg/dL — ABNORMAL HIGH (ref 0.44–1.00)
GFR calc non Af Amer: 49 mL/min — ABNORMAL LOW (ref >60)
GFR, EST AFRICAN AMERICAN: 56 mL/min — AB (ref >60)
Glucose, Bld: 124 mg/dL — ABNORMAL HIGH (ref 65–99)
Potassium: 3.4 mmol/L — ABNORMAL LOW (ref 3.5–5.1)
SODIUM: 144 mmol/L (ref 135–145)

## 2015-11-11 LAB — GLUCOSE, CAPILLARY
GLUCOSE-CAPILLARY: 132 mg/dL — AB (ref 65–99)
GLUCOSE-CAPILLARY: 118 mg/dL — AB (ref 65–99)
GLUCOSE-CAPILLARY: 135 mg/dL — AB (ref 65–99)
Glucose-Capillary: 110 mg/dL — ABNORMAL HIGH (ref 65–99)
Glucose-Capillary: 115 mg/dL — ABNORMAL HIGH (ref 65–99)

## 2015-11-11 LAB — TROPONIN I
TROPONIN I: 0.04 ng/mL — AB (ref <0.031)
TROPONIN I: 0.04 ng/mL — AB (ref <0.031)
Troponin I: 0.04 ng/mL — ABNORMAL HIGH (ref <0.031)
Troponin I: 0.06 ng/mL — ABNORMAL HIGH (ref <0.031)

## 2015-11-11 LAB — CBC
HEMATOCRIT: 31.8 % — AB (ref 36.0–46.0)
Hemoglobin: 10.7 g/dL — ABNORMAL LOW (ref 12.0–15.0)
MCH: 30.9 pg (ref 26.0–34.0)
MCHC: 33.6 g/dL (ref 30.0–36.0)
MCV: 91.9 fL (ref 78.0–100.0)
Platelets: 226 10*3/uL (ref 150–400)
RBC: 3.46 MIL/uL — ABNORMAL LOW (ref 3.87–5.11)
RDW: 16.5 % — ABNORMAL HIGH (ref 11.5–15.5)
WBC: 11.3 10*3/uL — ABNORMAL HIGH (ref 4.0–10.5)

## 2015-11-11 MED ORDER — METOPROLOL TARTRATE 50 MG PO TABS
50.0000 mg | ORAL_TABLET | Freq: Two times a day (BID) | ORAL | Status: DC
  Administered 2015-11-11 – 2015-11-17 (×12): 50 mg
  Filled 2015-11-11 (×13): qty 1

## 2015-11-11 MED ORDER — AMIODARONE HCL 200 MG PO TABS
400.0000 mg | ORAL_TABLET | Freq: Two times a day (BID) | ORAL | Status: DC
  Administered 2015-11-11 – 2015-11-17 (×12): 400 mg
  Filled 2015-11-11 (×12): qty 2

## 2015-11-11 MED ORDER — POTASSIUM CHLORIDE 10 MEQ/100ML IV SOLN
10.0000 meq | INTRAVENOUS | Status: AC
  Administered 2015-11-11 (×2): 10 meq via INTRAVENOUS
  Filled 2015-11-11: qty 100

## 2015-11-11 MED ORDER — JEVITY 1.2 CAL PO LIQD
1000.0000 mL | ORAL | Status: DC
  Administered 2015-11-11 – 2015-11-13 (×3): 1000 mL
  Filled 2015-11-11 (×10): qty 1000

## 2015-11-11 MED ORDER — PRO-STAT SUGAR FREE PO LIQD
30.0000 mL | Freq: Every day | ORAL | Status: DC
  Administered 2015-11-11 – 2015-11-16 (×5): 30 mL
  Filled 2015-11-11 (×6): qty 30

## 2015-11-11 MED ORDER — APIXABAN 5 MG PO TABS
5.0000 mg | ORAL_TABLET | Freq: Two times a day (BID) | ORAL | Status: DC
  Administered 2015-11-11 – 2015-11-17 (×12): 5 mg via ORAL
  Filled 2015-11-11 (×12): qty 1

## 2015-11-11 MED ORDER — MORPHINE SULFATE (PF) 2 MG/ML IV SOLN
0.5000 mg | INTRAVENOUS | Status: DC | PRN
  Administered 2015-11-11 – 2015-11-12 (×3): 0.5 mg via INTRAVENOUS
  Filled 2015-11-11 (×3): qty 1

## 2015-11-11 NOTE — Progress Notes (Addendum)
ANTICOAGULATION CONSULT NOTE - Initial Consult  Pharmacy Consult for apixaban Indication: atrial fibrillation  No Known Allergies  Patient Measurements: Height: '5\' 5"'$  (165.1 cm) Weight: 149 lb 7.6 oz (67.8 kg) IBW/kg (Calculated) : 57  Vital Signs: Temp: 98.3 F (36.8 C) (01/11 1200) Temp Source: Axillary (01/11 1200) BP: 186/113 mmHg (01/11 1200) Pulse Rate: 60 (01/11 1200)  Labs:  Recent Labs  11/09/15 0351 11/10/15 0423 11/11/15 0102 11/11/15 0554 11/11/15 1300  HGB 5.9* 10.3* 10.7*  --   --   HCT 17.7* 31.0* 31.8*  --   --   PLT 155 161 226  --   --   CREATININE 1.27* 1.20* 1.10*  --   --   TROPONINI  --   --  0.04* 0.06* 0.04*    Estimated Creatinine Clearance: 41 mL/min (by C-G formula based on Cr of 1.1).  Assessment: Patient with a fib. No AC PTA Renal fx stable  Recent surgery with some post-op anemia  Plan:  Apixaban 5 mg BID Monitor for s/sx of bleeding  Pharmacy will sign off but are available as needed.  Levester Fresh, PharmD, BCPS, [facility] Clinical Pharmacist Pager [phone] 11/11/2015 2:45 PM

## 2015-11-11 NOTE — Progress Notes (Signed)
   Subjective:  Failed swallow eval. NGT placement pending.  Objective:   VITALS:   Filed Vitals:   11/11/15 0000 11/11/15 0100 11/11/15 0248 11/11/15 0400  BP: 183/109   193/127  Pulse: 100 54  111  Temp:   97.6 F (36.4 C)   TempSrc:   Axillary   Resp: '18 23  24  '$ Height:      Weight:      SpO2: 99% 98%  100%    Patient aphasic ABD soft Intact pulses distally Dorsiflexion/Plantar flexion intact Incision: dressing C/D/I Compartment soft Unable to test sensation due to MS  Lab Results  Component Value Date   WBC 11.3* 11/11/2015   HGB 10.7* 11/11/2015   HCT 31.8* 11/11/2015   MCV 91.9 11/11/2015   PLT 226 11/11/2015   BMET    Component Value Date/Time   NA 144 11/11/2015 0102   K 3.4* 11/11/2015 0102   CL 110 11/11/2015 0102   CO2 22 11/11/2015 0102   GLUCOSE 124* 11/11/2015 0102   BUN 16 11/11/2015 0102   CREATININE 1.10* 11/11/2015 0102   CALCIUM 8.5* 11/11/2015 0102   GFRNONAA 49* 11/11/2015 0102   GFRAA 56* 11/11/2015 0102     Assessment/Plan: 3 Days Post-Op   Principal Problem:   Hip fracture, right (HCC) Active Problems:   Adenocarcinoma of left lung, stage 1 (HCC)   Bilateral carotid artery disease (HCC)   Hyperlipidemia   Hypotension   Dysphagia   CKD (chronic kidney disease), stage III   Sacral fracture, closed (HCC)   Closed right hip fracture (HCC)   Atrial fibrillation (HCC)   right superior and inferior pubic rami   Fracture, intertrochanteric, right femur (HCC)   Postoperative anemia due to acute blood loss   S/p IM nail R pertrochanteric femur fx R LC1 pelvis fx  TDWB RLE PT/OT for bed to chair xfers DVT ppx: ASA, cont plavix, SCDs PO pain control SNF planning   Nayali Talerico, Horald Pollen 11/11/2015, 7:44 AM   Rod Can, MD Cell [phone]

## 2015-11-11 NOTE — Progress Notes (Signed)
Subjective:  Drowsy and sleepy, in no acute distress.   Had atrial flutter with rapid ventricular response last night.  Back now in sinus rhythm  Objective:  Vital Signs in the last 24 hours: Temp:  [97.6 F (36.4 C)-98.5 F (36.9 C)] 98.3 F (36.8 C) (01/11 1200) Pulse Rate:  [48-117] 60 (01/11 1200) Resp:  [15-24] 15 (01/11 1200) BP: (136-193)/(98-127) 186/113 mmHg (01/11 1200) SpO2:  [98 %-100 %] 100 % (01/11 1200) Weight:  [65.7 kg (144 lb 13.5 oz)-67.8 kg (149 lb 7.6 oz)] 67.8 kg (149 lb 7.6 oz) (01/11 0500)  Intake/Output from previous day: 01/10 0701 - 01/11 0700 In: 950 [I.V.:950] Out: 3900 [Urine:3900] Intake/Output from this shift: Total I/O In: -  Out: 750 [Urine:750]  Physical Exam: Neck: no adenopathy, no carotid bruit, no JVD and supple, symmetrical, trachea midline Lungs: clear to auscultation anteriorly Heart: regular rate and rhythm, S1, S2 normal and 2/6 systolic murmur noted Abdomen: soft, non-tender; bowel sounds normal; no masses,  no organomegaly Extremities: extremities normal, atraumatic, no cyanosis or edema and right hip surgical site.  Soft  Lab Results:  Recent Labs  11/10/15 0423 11/11/15 0102  WBC 9.7 11.3*  HGB 10.3* 10.7*  PLT 161 226    Recent Labs  11/10/15 0423 11/11/15 0102  NA 146* 144  K 4.1 3.4*  CL 113* 110  CO2 21* 22  GLUCOSE 98 124*  BUN 17 16  CREATININE 1.20* 1.10*    Recent Labs  11/11/15 0554 11/11/15 1300  TROPONINI 0.06* 0.04*   Hepatic Function Panel No results for input(s): PROT, ALBUMIN, AST, ALT, ALKPHOS, BILITOT, BILIDIR, IBILI in the last 72 hours. No results for input(s): CHOL in the last 72 hours. No results for input(s): PROTIME in the last 72 hours.  Imaging: Imaging results have been reviewed and Dg Abd 1 View  11/11/2015  CLINICAL DATA:  Evaluate NG tube placed EXAM: ABDOMEN - 1 VIEW COMPARISON:  08/23/14 FINDINGS: A weighted feeding tube is identified. The tip is in the expected location  of the distal stomach or proximal duodenum. The bowel gas pattern is on unremarkable. No dilated loops of small bowel identified. IMPRESSION: 1. Weighted feeding tube is either in the distal stomach or proximal duodenum. Electronically Signed   By: Kerby Moors M.D.   On: 11/11/2015 11:31    Cardiac Studies:  Assessment/Plan:  Right hip Complex fracture / pubic rami / right-sided sacral fracture status post fall status post intramedullary nail Recurrent paroxysmal A. fib flutter with 2-1 block converted to sinus rhythm chadsvac score of 8 Postop anemia Hypertrophic cardiomyopathy with SAM Severe mitral regurgitation and moderate tricuspid regurgitation Hypertension History of CVA in the past Hyperlipidemia Bilateral carotid artery disease Adenocarcinoma of the left lung Plan Increase amiodarone to 400 mg twice daily. Increase Lopressor to 50 mg twice daily. DC Plavix. Start Eliquis per pharmacy  LOS: 7 days    Stephanie [name] 11/11/2015, 2:36 PM

## 2015-11-11 NOTE — Progress Notes (Signed)
Pt had tachycardia this shift ranging from ST to Aflutter. Can not take scheduled Metoprolol due to being NPO. Has Metoprolol IV ordered prn for tachycardia. Metoprolol given and HR has been normal since then. Troponins cycling given arrhythmia. 1st troponin .04.  KJKG, NP

## 2015-11-11 NOTE — Progress Notes (Signed)
Initial Nutrition Assessment  DOCUMENTATION CODES:   Non-severe (moderate) malnutrition in context of chronic illness  INTERVENTION:    Initiate TF via Cortrak tube with Jevity 1.2 at 20 ml/h, increase by 10 ml every 4 hours to goal rate of 55 ml/h with Prostat 30 ml once daily to provide 1684 kcals, 88 gm protein, 1069 ml free water daily.  NUTRITION DIAGNOSIS:   Inadequate oral intake related to lethargy/confusion as evidenced by meal completion < 25%.  GOAL:   Patient will meet greater than or equal to 90% of their needs  MONITOR:   PO intake, Labs, Weight trends, TF tolerance, I & O's  REASON FOR ASSESSMENT:   Consult Enteral/tube feeding initiation and management  ASSESSMENT:   75 year old aphasic white female from skilled nursing facility who fell out of wheelchair sustained a right hip fracture and was admitted to [facility] on 1/4. She was also noted to have new A. fib with RVR. Dr. Aline Brochure, orthopedics was consulted and felt that patient needed transfer to a higher level of care for anesthesia and cardiology coverage. Transferred to [facility]. Cardiology consulted and had IM Nail 1/9.   Labs reviewed: potassium slightly low at 3.4.  Nutrition-Focused physical exam completed. Findings are mild fat depletion, mild muscle depletion, and no edema. Patient moaning and crying during RD visit. Patient is currently NPO. Previously on a dysphagia 1 diet with thin liquids. SLP following. Patient has not been alert enough to take any PO's. Cortrak tube was placed this AM for initiation of TF. Palliative Care consult has also been placed for goals of care. Received MD Consult for TF initiation and management.  Diet Order:   NPO  Skin:  Reviewed, no issues  Last BM:  unknown  Height:   Ht Readings from Last 1 Encounters:  11/05/15 '5\' 5"'$  (1.651 m)    Weight:   Wt Readings from Last 1 Encounters:  11/11/15 149 lb 7.6 oz (67.8 kg)    Ideal Body Weight:  56.8  kg  BMI:  Body mass index is 24.87 kg/(m^2).  Estimated Nutritional Needs:   Kcal:  1600-1800  Protein:  85-100 gm  Fluid:  1.6-1.8 L  EDUCATION NEEDS:   No education needs identified at this time  Molli Barrows, [place], [place], [place] Pager [phone] After Hours Pager [phone]

## 2015-11-11 NOTE — Progress Notes (Signed)
Speech Language Pathology Treatment: Dysphagia  Patient Details Name: Stephanie [name] MRN: [id number] DOB: November 05, 1940 Today's Date: 11/11/2015 Time: 1191-4782 SLP Time Calculation (min) (ACUTE ONLY): 11 min  Assessment / Plan / Recommendation Clinical Impression  SLP provided max verbal and tactile cues to improve awareness and oral manipulation of PO. Pt awake but made no attempt to gather nectar thick teaspoon bolus for intake. Pt did respond to hand over hand cueing, lifting cup to mouth, but still no oral manipulation. Continue to recommend alternate feeding. SLP will follow for readiness.    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 Admitted from skilled nursing facility who fell out of wheelchair sustained a right hip fracture and was admitted to [facility] on 1/4. She was also noted to have new A. fib with RVR. She was started on a Cardizem drip and briefly became hypotensive. Drip was subsequently stopped. Dr. Aline Brochure, orthopedics was consulted and felt that patient needed transfer to a higher level of care for anesthesia and cardiology coverage. 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 Pt has been seen by SLP in the past, recommended to consume a dys 3 diet and thin liquids, no significant dysphagia. Able to masticate without dentition.       SLP Plan  Continue with current plan of care     Recommendations  Diet recommendations: NPO              Plan: Continue with current plan of care   Kushal Saunders, Katherene Ponto 11/11/2015, 11:23 AM

## 2015-11-11 NOTE — Progress Notes (Signed)
[facility] PROGRESS NOTE   Stephanie [name] GMW:[id number] DOB: 1941-02-03 DOA: 11/04/2015 PCP: [name], MD  Summary 75 year old aphasic white female from skilled nursing facility who fell out of wheelchair sustained a right hip fracture and was admitted to [facility] on 1/4. She was also noted to have new A. fib with RVR. She was started on a Cardizem drip and briefly became hypotensive. Drip was subsequently stopped. Dr. Aline Brochure, orthopedics was consulted and felt that patient needed transfer to a higher level of care for anesthesia and cardiology coverage.  Transferred to [facility]. Cardiology consulted and had IM Nail 1/9.  Failed swallow eval 1/10.  NG tube placed  Assessment/Plan: Principal Problem:   Hip fracture, right ([place]) Active Problems:   Adenocarcinoma of left lung, stage 1 (HCC)   Bilateral carotid artery disease (HCC)   Hyperlipidemia   Hypotension   Dysphagia   CKD (chronic kidney disease), stage III   Sacral fracture, closed (HCC)   Closed right hip fracture (HCC)   Atrial fibrillation (HCC)   right superior and inferior pubic rami   Fracture, intertrochanteric, right femur (HCC)   Postoperative anemia due to acute blood loss   Right hip Fracture S/p IM nail.   Right sacral fracture/Right superior and inferior pubic rami fx seen on CT PT eval -from SNF  New onset of paroxysmal A. 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 CHADSVASC 5. Eventual anticoagulation per cardiology. Will order prn labetalol -scheduled metoprolol -cards following  Acute postoperative blood loss anemia: s.p 2 units prbc   Severe MR  HOCM  TIA/stroke -Has history of stroke in 2012, recent history of TIA in December 2016. -Patient is on Plavix Aphasic since 2/12  Acute renal failure on CKD stage III.  creatinine stable  History of lung cancer stage I History of lung cancer diagnosed January 2013 with surgical resection, was T2a, pN0. Continue  Iressa, per last oncology note question of progression of adenocarcinoma but not proven by biopsy.  HLD -Continue with statin.  HTN  NGT never placed- await this for meds  Palliative care cosult   Code Status: DNR Family Communication: spoke with daughter- [name] Disposition Plan: Back to skilled nursing facility once stable  Consultants:  Ortho  cardiology  Procedures:  IM nail  Antibiotics:  None  Subjective: Moans when sternal rubbed  Objective: Filed Vitals:   11/11/15 0248 11/11/15 0400  BP:  193/127  Pulse:  111  Temp: 97.6 F (36.4 C)   Resp:  24    Intake/Output Summary (Last 24 hours) at 11/11/15 0816 Last data filed at 11/11/15 0253  Gross per 24 hour  Intake 840.83 ml  Output   2400 ml  Net -1559.17 ml   Filed Weights   11/05/15 2224 11/10/15 1537 11/11/15 0500  Weight: 70 kg (154 lb 5.2 oz) 65.7 kg (144 lb 13.5 oz) 67.8 kg (149 lb 7.6 oz)    telemetry: NSR  Exam: General: somnolent. arousable HEENT edentulous. Dry MM CVS: RRR with 2/6 murmur Chest: clear to auscultation bilaterally, no wheezing, rales or rhonchi Abdomen: soft nontender, nondistended, normal bowel sounds, no organomegaly Extremities: no cyanosis, clubbing or edema noted bilaterally, tender at right hip   Data Reviewed: Basic Metabolic Panel:  Recent Labs Lab 11/07/15 1120 11/08/15 0539 11/09/15 0351 11/10/15 0423 11/11/15 0102  NA 140 140 140 146* 144  K 4.6 4.2 4.0 4.1 3.4*  CL 113* 113* 113* 113* 110  CO2 23 21* 19* 21*  22  GLUCOSE 124* 112* 109* 98 124*  BUN 27* 21* '17 17 16  '$ CREATININE 1.64* 1.38* 1.27* 1.20* 1.10*  CALCIUM 8.1* 8.3* 7.8* 8.1* 8.5*   Liver Function Tests:  Recent Labs Lab 11/04/15 2000  AST 27  ALT 19  ALKPHOS 96  BILITOT 0.8  PROT 7.5  ALBUMIN 3.5   No results for input(s): LIPASE, AMYLASE in the last 168 hours. No results for input(s): AMMONIA in the last 168 hours. CBC:  Recent Labs Lab 11/04/15 2000  11/09/15 0351 11/10/15 0423 11/11/15 0102  WBC 14.2* 7.6 9.7 11.3*  NEUTROABS 12.3*  --   --   --   HGB 12.1 5.9* 10.3* 10.7*  HCT 35.9* 17.7* 31.0* 31.8*  MCV 97.6 98.9 95.1 91.9  PLT 224 155 161 226   Cardiac Enzymes:  Recent Labs Lab 11/11/15 0102 11/11/15 0554  TROPONINI 0.04* 0.06*   BNP (last 3 results) No results for input(s): BNP in the last 8760 hours.  ProBNP (last 3 results) No results for input(s): PROBNP in the last 8760 hours.  CBG: No results for input(s): GLUCAP in the last 168 hours.  Micro No results found for this or any previous visit (from the past 240 hour(s)).   Studies: No results found.  Scheduled Meds: . sodium chloride   Intravenous Once  . amiodarone  400 mg Per Tube Daily  . antiseptic oral rinse  7 mL Mouth Rinse BID  . aspirin  325 mg Per Tube Daily  . clopidogrel  75 mg Per Tube Daily  . metoprolol tartrate  25 mg Per Tube BID  . mupirocin ointment  1 application Nasal BID  . sodium chloride  3 mL Intravenous Q12H   Continuous Infusions: . sodium chloride 50 mL/hr at 11/11/15 0307  . feeding supplement (JEVITY 1.2 CAL)     Time spent: 35 minutes  [facility] [email or web address], password [facility] 11/11/2015, 8:16 AM  LOS: 7 days

## 2015-11-12 ENCOUNTER — Inpatient Hospital Stay (HOSPITAL_COMMUNITY): Payer: Medicare Other

## 2015-11-12 ENCOUNTER — Encounter (HOSPITAL_COMMUNITY): Payer: Self-pay | Admitting: Orthopedic Surgery

## 2015-11-12 DIAGNOSIS — D62 Acute posthemorrhagic anemia: Secondary | ICD-10-CM

## 2015-11-12 DIAGNOSIS — E440 Moderate protein-calorie malnutrition: Secondary | ICD-10-CM | POA: Insufficient documentation

## 2015-11-12 DIAGNOSIS — Z515 Encounter for palliative care: Secondary | ICD-10-CM

## 2015-11-12 DIAGNOSIS — Z66 Do not resuscitate: Secondary | ICD-10-CM | POA: Diagnosis present

## 2015-11-12 DIAGNOSIS — I4891 Unspecified atrial fibrillation: Secondary | ICD-10-CM

## 2015-11-12 LAB — GLUCOSE, CAPILLARY
GLUCOSE-CAPILLARY: 157 mg/dL — AB (ref 65–99)
GLUCOSE-CAPILLARY: 136 mg/dL — AB (ref 65–99)
GLUCOSE-CAPILLARY: 146 mg/dL — AB (ref 65–99)
GLUCOSE-CAPILLARY: 100 mg/dL — AB (ref 65–99)
Glucose-Capillary: 140 mg/dL — ABNORMAL HIGH (ref 65–99)
Glucose-Capillary: 128 mg/dL — ABNORMAL HIGH (ref 65–99)

## 2015-11-12 MED ORDER — MORPHINE SULFATE (PF) 2 MG/ML IV SOLN
INTRAVENOUS | Status: AC
  Administered 2015-11-12: 1 mg via INTRAVENOUS
  Filled 2015-11-12: qty 1

## 2015-11-12 MED ORDER — ALPRAZOLAM 0.25 MG PO TABS: 0.2500 mg | ORAL_TABLET | Freq: Three times a day (TID) | ORAL | Status: DC | PRN

## 2015-11-12 MED ORDER — ACETAMINOPHEN 160 MG/5ML PO SOLN
500.0000 mg | Freq: Three times a day (TID) | ORAL | Status: DC
  Administered 2015-11-12 – 2015-11-16 (×7): 500 mg via ORAL
  Filled 2015-11-12 (×9): qty 20.3

## 2015-11-12 MED ORDER — TRAMADOL HCL 50 MG PO TABS
50.0000 mg | ORAL_TABLET | Freq: Four times a day (QID) | ORAL | Status: DC | PRN
Start: 2015-11-12 — End: 2015-11-16
  Administered 2015-11-12 – 2015-11-15 (×7): 50 mg via ORAL
  Filled 2015-11-12 (×8): qty 1

## 2015-11-12 MED ORDER — MORPHINE SULFATE (PF) 2 MG/ML IV SOLN
1.0000 mg | INTRAVENOUS | Status: DC | PRN
  Administered 2015-11-12 – 2015-11-14 (×7): 1 mg via INTRAVENOUS
  Filled 2015-11-12 (×6): qty 1

## 2015-11-12 MED ORDER — ALPRAZOLAM 0.25 MG PO TABS
0.2500 mg | ORAL_TABLET | Freq: Every day | ORAL | Status: DC | PRN
  Administered 2015-11-12: 0.25 mg via ORAL
  Filled 2015-11-12: qty 1

## 2015-11-12 MED ORDER — ALPRAZOLAM 0.25 MG PO TABS: 0.2500 mg | ORAL_TABLET | Freq: Two times a day (BID) | ORAL | Status: DC | PRN

## 2015-11-12 NOTE — Progress Notes (Signed)
Subjective: Patient is awake moaning in no acute distress. Remains in sinus rhythm. Heart rate better controlled.   Objective:  Vital Signs in the last 24 hours: Temp:  [97.9 F (36.6 C)-98.9 F (37.2 C)] 98.8 F (37.1 C) (01/12 0700) Pulse Rate:  [47-99] 88 (01/12 0800) Resp:  [13-27] 22 (01/12 0800) BP: (117-207)/(67-113) 150/74 mmHg (01/12 0800) SpO2:  [98 %-100 %] 100 % (01/12 0800) Weight:  [66.8 kg (147 lb 4.3 oz)] 66.8 kg (147 lb 4.3 oz) (01/12 0329)  Intake/Output from previous day: 01/11 0701 - 01/12 0700 In: 1020 [I.V.:600; NG/GT:420] Out: 1400 [Urine:1400] Intake/Output from this shift:    Physical Exam: Neck: no adenopathy, no carotid bruit, no JVD and supple, symmetrical, trachea midline Lungs: Clear to auscultation anteriorly Heart: regular rate and rhythm, S1, S2 normal and 2/6 systolic murmur noted Abdomen: soft, non-tender; bowel sounds normal; no masses,  no organomegaly Extremities: extremities normal, atraumatic, no cyanosis or edema and Right hip surgical site soft  Lab Results:  Recent Labs  11/10/15 0423 11/11/15 0102  WBC 9.7 11.3*  HGB 10.3* 10.7*  PLT 161 226    Recent Labs  11/10/15 0423 11/11/15 0102  NA 146* 144  K 4.1 3.4*  CL 113* 110  CO2 21* 22  GLUCOSE 98 124*  BUN 17 16  CREATININE 1.20* 1.10*    Recent Labs  11/11/15 1300 11/11/15 1956  TROPONINI 0.04* 0.04*   Hepatic Function Panel No results for input(s): PROT, ALBUMIN, AST, ALT, ALKPHOS, BILITOT, BILIDIR, IBILI in the last 72 hours. No results for input(s): CHOL in the last 72 hours. No results for input(s): PROTIME in the last 72 hours.  Imaging: Imaging results have been reviewed and Dg Abd 1 View  11/11/2015  CLINICAL DATA:  Evaluate NG tube placed EXAM: ABDOMEN - 1 VIEW COMPARISON:  08/23/14 FINDINGS: A weighted feeding tube is identified. The tip is in the expected location of the distal stomach or proximal duodenum. The bowel gas pattern is on unremarkable.  No dilated loops of small bowel identified. IMPRESSION: 1. Weighted feeding tube is either in the distal stomach or proximal duodenum. Electronically Signed   By: Kerby Moors M.D.   On: 11/11/2015 11:31    Cardiac Studies:  Assessment/Plan:  Right hip Complex fracture / pubic rami / right-sided sacral fracture status post fall status post intramedullary nail Recurrent paroxysmal A. fib flutter with 2-1 block converted to sinus rhythm chadsvac score of 8 Postop anemia Hypertrophic cardiomyopathy with SAM Severe mitral regurgitation and moderate tricuspid regurgitation Hypertension History of CVA in the past Hyperlipidemia Bilateral carotid artery disease Adenocarcinoma of the left lung Plan   continue present management Check EKG in a.m. Will adjust dose of amiodarone tomorrow   LOS: 8 days    Charolette Forward 11/12/2015, 10:22 AM

## 2015-11-12 NOTE — Progress Notes (Signed)
Tape on patient's feeding tube attached to her nose came loose, I re-tape with a piece of tape and notified Production manager.  Patient's nurse

## 2015-11-12 NOTE — Progress Notes (Signed)
UR COMPLETED  

## 2015-11-12 NOTE — Progress Notes (Signed)
[facility] PROGRESS NOTE   Stephanie [name] CVE:[id number] DOB: January 29, 1941 DOA: 11/04/2015 PCP: Stephanie Lange, MD  Summary 75 year old aphasic white female from skilled nursing facility who fell out of wheelchair sustained a right hip fracture and was admitted to [facility] on 1/4. She was also noted to have new A. fib with RVR. She was started on a Cardizem drip and briefly became hypotensive. Drip was subsequently stopped. Dr. Aline [name], orthopedics was consulted and felt that patient needed transfer to a higher level of care for anesthesia and cardiology coverage.  Transferred to [facility]. Cardiology consulted and had IM Nail 1/9.  Failed swallow eval 1/10.  NG tube placed  Assessment/Plan: Principal Problem:   Hip fracture, right ([place]) Active Problems:   Adenocarcinoma of left lung, stage 1 (HCC)   Bilateral carotid artery disease (HCC)   Hyperlipidemia   Hypotension   Dysphagia   CKD (chronic kidney disease), stage III   Sacral fracture, closed (HCC)   Closed right hip fracture (HCC)   Atrial fibrillation (HCC)   right superior and inferior pubic rami   Fracture, intertrochanteric, right femur (HCC)   Postoperative anemia due to acute blood loss   Malnutrition of moderate degree   Right hip Fracture S/p IM nail.   Right sacral fracture/Right superior and inferior pubic rami fx seen on CT PT eval -from SNF  New onset of paroxysmal A. 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 CHADSVASC 5.  eliquis started  Will order prn labetalol -scheduled metoprolol -cards following  Acute postoperative blood loss anemia: s.p 2 units prbc   Severe MR  HOCM  TIA/stroke -Has history of stroke in 2012, recent history of TIA in December 2016. -Patient is on Plavix Aphasic since 2/12  Acute renal failure on CKD stage III.  creatinine stable  History of lung cancer stage I History of lung cancer diagnosed January 2013 with surgical resection, was T2a,  pN0. Continue Iressa, per last oncology note question of progression of adenocarcinoma but not proven by biopsy.  HLD -Continue with statin.  HTN  NG Tube- -SLP following  Palliative care cosult   Code Status: DNR Family Communication: spoke with daughter- Stephanie [name] 1/11 Disposition Plan: Back to skilled nursing facility once stable?  Consultants:  Ortho  Cardiology  Palliative care  Procedures:  IM nail  Antibiotics:  None  Subjective: Moaning Will not follow commands  Objective: Filed Vitals:   11/12/15 0329 11/12/15 0335  BP: 173/93 130/74  Pulse: 94 86  Temp: 98.8 F (37.1 C)   Resp: 25 27    Intake/Output Summary (Last 24 hours) at 11/12/15 0800 Last data filed at 11/12/15 0600  Gross per 24 hour  Intake   1020 ml  Output   1225 ml  Net   -205 ml   Filed Weights   11/10/15 1537 11/11/15 0500 11/12/15 0329  Weight: 65.7 kg (144 lb 13.5 oz) 67.8 kg (149 lb 7.6 oz) 66.8 kg (147 lb 4.3 oz)    telemetry: NSR  Exam: General: somnolent. arousable HEENT edentulous. Dry MM CVS: RRR with 2/6 murmur Chest: clear to auscultation bilaterally, no wheezing, rales or rhonchi Abdomen: soft nontender, nondistended, normal bowel sounds, no organomegaly Extremities: no cyanosis, clubbing or edema noted bilaterally, tender at right hip   Data Reviewed: Basic Metabolic Panel:  Recent Labs Lab 11/07/15 1120 11/08/15 0539 11/09/15 0351 11/10/15 0423 11/11/15 0102  NA 140 140 140 146* 144  K 4.6 4.2 4.0 4.1 3.4*  CL 113* 113* 113* 113* 110  CO2 23 21* 19* 21* 22  GLUCOSE 124* 112* 109* 98 124*  BUN 27* 21* '17 17 16  '$ CREATININE 1.64* 1.38* 1.27* 1.20* 1.10*  CALCIUM 8.1* 8.3* 7.8* 8.1* 8.5*   Liver Function Tests: No results for input(s): AST, ALT, ALKPHOS, BILITOT, PROT, ALBUMIN in the last 168 hours. No results for input(s): LIPASE, AMYLASE in the last 168 hours. No results for input(s): AMMONIA in the last 168 hours. CBC:  Recent Labs Lab  11/09/15 0351 11/10/15 0423 11/11/15 0102  WBC 7.6 9.7 11.3*  HGB 5.9* 10.3* 10.7*  HCT 17.7* 31.0* 31.8*  MCV 98.9 95.1 91.9  PLT 155 161 226   Cardiac Enzymes:  Recent Labs Lab 11/11/15 0102 11/11/15 0554 11/11/15 1300 11/11/15 1956  TROPONINI 0.04* 0.06* 0.04* 0.04*   BNP (last 3 results) No results for input(s): BNP in the last 8760 hours.  ProBNP (last 3 results) No results for input(s): PROBNP in the last 8760 hours.  CBG:  Recent Labs Lab 11/11/15 1233 11/11/15 1545 11/11/15 2003 11/11/15 2322 11/12/15 0328  GLUCAP 115* 132* 118* 135* 140*    Micro No results found for this or any previous visit (from the past 240 hour(s)).   Studies: Dg Abd 1 View  11/11/2015  CLINICAL DATA:  Evaluate NG tube placed EXAM: ABDOMEN - 1 VIEW COMPARISON:  08/23/14 FINDINGS: A weighted feeding tube is identified. The tip is in the expected location of the distal stomach or proximal duodenum. The bowel gas pattern is on unremarkable. No dilated loops of small bowel identified. IMPRESSION: 1. Weighted feeding tube is either in the distal stomach or proximal duodenum. Electronically Signed   By: Kerby Moors M.D.   On: 11/11/2015 11:31    Scheduled Meds: . amiodarone  400 mg Per Tube BID  . antiseptic oral rinse  7 mL Mouth Rinse BID  . apixaban  5 mg Oral BID  . feeding supplement (PRO-STAT SUGAR FREE 64)  30 mL Per Tube Daily  . metoprolol tartrate  50 mg Per Tube BID  . sodium chloride  3 mL Intravenous Q12H   Continuous Infusions: . sodium chloride 50 mL/hr at 11/11/15 2239  . feeding supplement (JEVITY 1.2 CAL) 1,000 mL (11/12/15 0617)   Time spent: 35 minutes  [facility] [email or web address], password [facility] 11/12/2015, 8:00 AM  LOS: 8 days

## 2015-11-12 NOTE — Progress Notes (Signed)
Physical Therapy Treatment Patient Details Name: Stephanie [name] MRN: [id number] DOB: 04-26-1941 Today's Date: 11/12/2015    History of Present Illness Pt was admitted with R hip fx after falling from w/c at her SNF. S/p IM nail with post op anemia and afib with RVR complicating hospital course. 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    PT Comments    Pt more alert this session but demonstrates increased fatigue, requiring assist to maintain upright sitting EOB.  Therapeutic exercises completed sitting EOB w/ assist before assisted back to bed.  Follow Up Recommendations  SNF;Supervision/Assistance - 24 hour     Equipment Recommendations  Other (comment) (TBD at next venue of care)    Recommendations for Other Services       Precautions / Restrictions Precautions Precautions: Fall Restrictions Weight Bearing Restrictions: Yes RLE Weight Bearing: Partial weight bearing RLE Partial Weight Bearing Percentage or Pounds: 30    Mobility  Bed Mobility Overal bed mobility: Needs Assistance Bed Mobility: Rolling;Sidelying to Sit;Sit to Supine Rolling: +2 for physical assistance;Total assist Sidelying to sit: +2 for physical assistance;Total assist   Sit to supine: +2 for physical assistance;Total assist   General bed mobility comments: assist w/ use of bed pad, management of Bil LEs and support to trunk   Transfers                 General transfer comment: Did not attempt as pt was unable to maintain upright posture   Ambulation/Gait                 Stairs            Wheelchair Mobility    Modified Rankin (Stroke Patients Only)       Balance Overall balance assessment: Needs assistance Sitting-balance support: Feet supported;Single extremity supported Sitting balance-Leahy Scale: Poor Sitting balance - Comments: min guard to max assist x 10 minutes.  Pt requiring assist majority of the time Postural  control: Posterior lean;Left lateral lean                          Cognition Arousal/Alertness: Awake/alert Behavior During Therapy: Flat affect Overall Cognitive Status: History of cognitive impairments - at baseline                      Exercises General Exercises - Lower Extremity Ankle Circles/Pumps: PROM;Both;10 reps;Seated Long Arc Quad: PROM;10 reps;Seated;AROM;Right;Left    General Comments General comments (skin integrity, edema, etc.): Pt demonstrating Rt hip ER upon PT arrival lying supine in bed.  Positioned Bil LEs in neutral position, boosting w/ pillows and blankets.  RN and family educated on proper positioning.      Pertinent Vitals/Pain Pain Assessment: Faces Faces Pain Scale: Hurts even more Pain Location: Rt hip Pain Descriptors / Indicators: Moaning Pain Intervention(s): Limited activity within patient's tolerance;Monitored during session;Repositioned    Home Living                      Prior Function            PT Goals (current goals can now be found in the care plan section) Acute Rehab PT Goals Patient Stated Goal: daughter would like pt to return to her PLOF PT Goal Formulation: With family Time For Goal Achievement: 11/30/15 Potential to Achieve Goals: Fair Progress towards PT goals: Not progressing toward goals -  comment (limited by fatigue and pain)    Frequency  Min 2X/week    PT Plan Current plan remains appropriate    Co-evaluation             End of Session   Activity Tolerance: Patient limited by pain;Patient limited by fatigue Patient left: in bed;with call bell/phone within reach;with family/visitor present;with SCD's reapplied     Time: 7096-2836 PT Time Calculation (min) (ACUTE ONLY): 31 min  Charges:  $Therapeutic Exercise: 8-22 mins $Therapeutic Activity: 8-22 mins                    G Codes:      Joslyn Hy PT, [place] [phone] Pager: [phone] 11/12/2015, 1:38 PM

## 2015-11-12 NOTE — Progress Notes (Signed)
Patient as the day has progressed has been able to respond more and has followed some simple commands. Pt was able to squeeze hands and lifted her head when asked to adjust her pillow.

## 2015-11-12 NOTE — Consult Note (Signed)
Consultation Note Date: 11/12/2015   Patient Name: Stephanie [name]  DOB: 01-03-1941  MRN: [id number]  Age / Sex: 75 y.o., female  PCP: Kathyrn Drown, MD Referring Physician: Geradine Girt, DO  Reason for Consultation: Establishing goals of care, Non pain symptom management, Pain control and Psychosocial/spiritual support   Clinical Assessment/Narrative: 75 yo female with CVA/Aphasia, CKD stage III, hx of Lung Ca stage 1, from [facility], admitted with right hip fracture and new onset afib with RVR. The patient is a DNR.  She is currently in step down with an N/G in place for feeding.  She is s/p IM nailing of her right hip on 1/8.   Her heart rhythm has been in an out of atrial flutter and she has been started on Eliquis. Speech therapy recommends NPO.    Per her daughter Stephanie [name], two weeks ago the patient was primarily wheel chair bound but able to take small steps and transfer with assistance.  Her mother was able to speak some words, but experienced expressive aphasia - often saying "yes" when she meant "no".  Per her daughter Stephanie [name], her mother is sharp as a tack.  She was eating hot dogs and going out to restaurants with the family prior to her hip fracture.  [name] has 2 daughters and a son. Per her daughters she loves to eat, and be with family. Her daughters are very supportive and check on her multiple times a day at [facility]. Her daughters report that they noticed an acute change postop day 2. Their mother became less responsive and unable to take oral nutrition. I discussed this with Dr. Eliseo Squires, and we will order another CT head to rule out a new acute stroke.  The daughter's report that their mother would not want artificial life support, including a  PEG tube. Their goal is to have her return to [facility], be up to chair and able to take oral nutrition once again. 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 When she fell and broke her hip, her underlying heart disease became evident in the form of A. fib with RVR. 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  Contacts/Participants in Discussion: Daughter's Stephanie [name] and Sissy Primary Decision Maker: Stephanie [name] Relationship to Patient Daughter HCPOA: 3 children, Stephanie [name] and Son, Stephanie [name]   SUMMARY OF RECOMMENDATIONS  DNR / DNI, No artificial life support, No PEG  Balance pain control with ability to function - will schedule tylenol and order PRN tramadol for moderate pain.  Leave 1 mg morphine IV for severe pain.  Goal is to remove the N/G feeding tube and eat with the safest textures possible  Return to SNF [facility]   If patient can not support her nutritional needs family understands that Hospice services will be available, but they will need more information on these at the appropriate time.    Code Status/Advance Care Planning: DNR    Code Status Orders        Start     Ordered   11/08/15 1407  Do not attempt resuscitation (DNR)   Continuous    Question Answer Comment  In the event of cardiac or respiratory ARREST Do not call a "code blue"   In  the event of cardiac or respiratory ARREST Do not perform Intubation, CPR, defibrillation or ACLS   In the event of cardiac or respiratory ARREST Use medication by any route, position, wound care, and other measures to relive pain and suffering. May use oxygen, suction and manual treatment of airway obstruction as needed for comfort.      11/08/15 1406    Code Status History    Date Active Date Inactive Code Status Order ID Comments User Context   11/05/2015 12:55 AM 11/08/2015  2:06 PM DNR [id number]  Orvan Falconer, MD Inpatient   11/04/2015 10:44 PM 11/05/2015 12:55 AM DNR  [id number]  Orvan Falconer, MD ED   10/23/2015  7:27 AM 10/23/2015  4:53 PM DNR [id number]  Orvan Falconer, MD Inpatient   10/22/2015  4:04 PM 10/23/2015  7:27 AM Full Code [id number]  Orvan Falconer, MD Inpatient   10/22/2015  2:48 PM 10/22/2015  4:04 PM DNR [id number]  Orvan Falconer, MD ED   03/16/2015  4:18 PM 03/17/2015  3:54 PM DNR [id number]  Radene Gunning, NP ED   10/15/2014 11:43 PM 10/17/2014  9:00 PM DNR [id number]  Oswald Hillock, MD Inpatient   11/18/2011  5:55 PM 11/23/2011  2:17 PM Full Code [id number]  Stephanie Burkitt, RN Inpatient   10/14/2011  9:21 PM 10/19/2011 12:55 PM Full Code [id number]  Stephanie Fanny, RN Inpatient    Advance Directive Documentation        Most Recent Value   Type of Advance Directive  Out of facility DNR (pink MOST or yellow form)   Pre-existing out of facility DNR order (yellow form or pink MOST form)  Physician notified to receive inpatient order, Yellow form placed in chart (order not valid for inpatient use)   "MOST" Form in Place?        Other Directives:None  Symptom Management:   Scheduled tylenol for pain  PRN tramadol for moderate pain,  PRN morphine for severe pain.  Palliative Prophylaxis:   Aspiration, Delirium Protocol, Frequent Pain Assessment and Turn Reposition  Additional Recommendations (Limitations, Scope, Preferences):  Full Scope Treatment -  She is DNR , DNI.  Family says she would not want a PEG.   Psycho-social/Spiritual:  Support System: Strong Desire for further Chaplaincy support:no Additional Recommendations: Caregiving  Support/Resources  Prognosis:  Given multiple CVAs, possible HOCM with severe mitral regurg, afib, CKD III, dysphagia with a questionable ability to sustain her nutritional needs via PO intake - the patient's prognosis is likely less than 6 months.  If she is unable to eat and drink she may be eligible for residential hospice (and have only a few weeks)  Discharge Planning:  Return to [facility].      Chief Complaint/  Primary Diagnoses: Present on Admission:  . Hip fracture, right ([place]) . Adenocarcinoma of left lung, stage 1 ([place]) . Bilateral carotid artery disease ([place]) . Hyperlipidemia . CKD (chronic kidney disease), stage III . Sacral fracture, closed ([place]) . Closed right hip fracture ([place]) . Atrial fibrillation ([place]) . right superior and inferior pubic rami . Hypotension . Fracture, intertrochanteric, right femur ([place]) . DNR (do not resuscitate)  I have reviewed the medical record, interviewed the patient and family, and examined the patient. The following aspects are pertinent.  Past Medical History  Diagnosis Date  . CVA (cerebral infarction)     speech difficulities  . Stroke ([place])   . Hypercholesteremia     Takes Zocor daily  . Hypertension  takes Atenolol and Lisinopril daily  . Lung mass     left upper lobe  . Arthritis     hands  . TIA (transient ischemic attack)     Sat before thanksgiving;impaired speech  . Pneumothorax   . Lung cancer ([place])   . CKD (chronic kidney disease), stage III    Social History   Social History  . Marital Status: Widowed    Spouse Name: N/A  . Number of Children: N/A  . Years of Education: N/A   Social History Main Topics  . Smoking status: Never Smoker   . Smokeless tobacco: Never Used  . Alcohol Use: No  . Drug Use: No  . Sexual Activity: Not Currently    Birth Control/ Protection: Post-menopausal, Surgical   Other Topics Concern  . None   Social History Narrative   Family History  Problem Relation Age of Onset  . Stroke Mother   . Stroke Sister   . Anesthesia problems Neg Hx   . Hypotension Neg Hx   . Malignant hyperthermia Neg Hx   . Pseudochol deficiency Neg Hx   . Diabetes Brother   . Cancer Brother   . Stroke Brother    Scheduled Meds: . acetaminophen (TYLENOL) oral liquid 160 mg/5 mL  500 mg Oral Q8H  . amiodarone  400 mg Per Tube BID  . antiseptic oral rinse  7 mL Mouth Rinse BID  . apixaban  5 mg Oral BID    . feeding supplement (PRO-STAT SUGAR FREE 64)  30 mL Per Tube Daily  . metoprolol tartrate  50 mg Per Tube BID  . sodium chloride  3 mL Intravenous Q12H   Continuous Infusions: . feeding supplement (JEVITY 1.2 CAL) 1,000 mL (11/12/15 1650)   PRN Meds:.acetaminophen **OR** acetaminophen, menthol-cetylpyridinium **OR** phenol, metoprolol, morphine injection, ondansetron **OR** ondansetron (ZOFRAN) IV, senna-docusate, traMADol Medications Prior to Admission:  Prior to Admission medications   Medication Sig Start Date End Date Taking? Authorizing Provider  acetaminophen (TYLENOL) 325 MG tablet Take 650 mg by mouth every 6 (six) hours as needed for mild pain or moderate pain.    Yes Historical Provider, MD  ALPRAZolam Duanne Moron) 0.25 MG tablet Take one tablet by mouth three times daily as needed for anxiety. Monitor for sedation Patient taking differently: Take 0.25 mg by mouth 3 (three) times daily as needed for anxiety.  10/19/15  Yes Tiffany L Reed, DO  alum & mag hydroxide-simeth (MAALOX/MYLANTA) 200-200-20 MG/5 SUSP Apply 1 application topically as needed. Patient taking differently: Apply 1 application topically every 6 (six) hours as needed (for indigestion).  10/30/15  Yes Merrily Pew, MD  aspirin EC 81 MG tablet Take 81 mg by mouth every morning. Reported on 11/04/2015   Yes Historical Provider, MD  atenolol (TENORMIN) 25 MG tablet Take 25 mg by mouth daily.   Yes Historical Provider, MD  clopidogrel (PLAVIX) 75 MG tablet Take 75 mg by mouth daily.   Yes Historical Provider, MD  diphenoxylate-atropine ([name]) 2.5-0.025 MG tablet Take 1 tablet by mouth 4 (four) times daily as needed for diarrhea or loose stools.    Yes Historical Provider, MD  Doxepin HCl 3 MG TABS Take 1 tablet (3 mg total) by mouth at bedtime. 02/18/15  Yes Baird Cancer, PA-C  DULoxetine (CYMBALTA) 60 MG capsule Take 60 mg by mouth daily.   Yes Historical Provider, MD  gefitinib (IRESSA) 250 MG tablet Take 1 tablet (250  mg total) by mouth daily. 10/01/15  Yes Manon Hilding  Kefalas, PA-C  guaiFENesin-dextromethorphan (ROBITUSSIN DM) 100-10 MG/5ML syrup Take 5 mLs by mouth every 4 (four) hours as needed for cough.   Yes Historical Provider, MD  lamoTRIgine (LAMICTAL) 25 MG tablet Take 25 mg by mouth daily.   Yes Historical Provider, MD  metroNIDAZOLE (METROGEL) 0.75 % gel Apply 1 application topically daily. **Thin layer applied to the right cheek area   Yes Historical Provider, MD  pantoprazole (PROTONIX) 20 MG tablet Take 1 tablet (20 mg total) by mouth 2 (two) times daily. 10/30/15  Yes Merrily Pew, MD  senna-docusate (SENOKOT-S) 8.6-50 MG per tablet Take 1 tablet by mouth at bedtime as needed for mild constipation. 03/17/15  Yes Lezlie Octave Black, NP  simvastatin (ZOCOR) 20 MG tablet Take 1 tablet (20 mg total) by mouth daily. Patient taking differently: Take 20 mg by mouth every evening.  08/27/14  Yes Kathyrn Drown, MD  traMADol (ULTRAM) 50 MG tablet Take 1 tablet (50 mg total) by mouth every 6 (six) hours as needed. 04/11/15  Yes Fredia Sorrow, MD   No Known Allergies  Review of Systems  Unable to perform ROS   Physical Exam  Elderly female, lying in bed, follows commands from her children to raise her head or squeeze her hand CV:  Irregular rhythm, rate controlled, 3/6 systolic murmur Pulmonary: No increased work of breathing, no wheezes crackles or rales. Patient did not inspire deeply Abdomen: Soft, nontender, nondistended, positive bowel sounds Extremities: Multiple bruises, no ankle edema   Vital Signs: BP 128/65 mmHg  Pulse 84  Temp(Src) 96.9 F (36.1 C) (Axillary)  Resp 19  Ht '5\' 7"'$  (1.702 m)  Wt 66.8 kg (147 lb 4.3 oz)  BMI 23.06 kg/m2  SpO2 97%  SpO2: SpO2: 97 % O2 Device:SpO2: 97 % O2 Flow Rate: .O2 Flow Rate (L/min): 2 L/min  IO: Intake/output summary:   Intake/Output Summary (Last 24 hours) at 11/12/15 2120 Last data filed at 11/12/15 2000  Gross per 24 hour  Intake    890 ml    Output    875 ml  Net     15 ml    LBM: Last BM Date:  (unknown) Baseline Weight: Weight: 63.504 kg (140 lb) Most recent weight: Weight: 66.8 kg (147 lb 4.3 oz)      Palliative Assessment/Data:  Flowsheet Rows        Most Recent Value   Intake Tab    Referral Department  Hospitalist   Unit at Time of Referral  Intermediate Care Unit   Palliative Care Primary Diagnosis  Pain   Date Notified  11/11/15   Palliative Care Type  New Palliative care   Reason for referral  Clarify Goals of Care, Pain   Date of Admission  11/04/15   Date first seen by Palliative Care  11/12/15   # of days Palliative referral response time  1 Day(s)   # of days IP prior to Palliative referral  7   Clinical Assessment    Palliative Performance Scale Score  20%   Pain Max last 24 hours  10   Pain Min Last 24 hours  3   Anxiety Max Last 24 Hours  8   Anxiety Min Last 24 Hours  3   Psychosocial & Spiritual Assessment    Social Work Plan of Care  Clarified patient/family wishes with healthcare team   Palliative Care Outcomes    Patient/Family meeting held?  Yes   Who was at the meeting?  daughters Stephanie [name] and  Sissy   Palliative Care Outcomes  Improved pain interventions, Clarified goals of care   Patient/Family wishes: Interventions discontinued/not started   Mechanical Ventilation, PEG   Palliative Care follow-up planned  Yes, Facility      Additional Data Reviewed:  CBC:    Component Value Date/Time   WBC 11.3* 11/11/2015 0102   HGB 10.7* 11/11/2015 0102   HCT 31.8* 11/11/2015 0102   PLT 226 11/11/2015 0102   MCV 91.9 11/11/2015 0102   NEUTROABS 12.3* 11/04/2015 2000   LYMPHSABS 1.0 11/04/2015 2000   MONOABS 0.7 11/04/2015 2000   EOSABS 0.2 11/04/2015 2000   BASOSABS 0.0 11/04/2015 2000   Comprehensive Metabolic Panel:    Component Value Date/Time   NA 144 11/11/2015 0102   K 3.4* 11/11/2015 0102   CL 110 11/11/2015 0102   CO2 22 11/11/2015 0102   BUN 16 11/11/2015 0102   CREATININE  1.10* 11/11/2015 0102   GLUCOSE 124* 11/11/2015 0102   CALCIUM 8.5* 11/11/2015 0102   AST 27 11/04/2015 2000   ALT 19 11/04/2015 2000   ALKPHOS 96 11/04/2015 2000   BILITOT 0.8 11/04/2015 2000   PROT 7.5 11/04/2015 2000   ALBUMIN 3.5 11/04/2015 2000     Time In: 3:30 pm Time Out: 5:45 pm Time Total: 135 min. Greater than 50%  of this time was spent counseling and coordinating care related to the above assessment and plan. Spoke with Dr. Eliseo Squires as well as my attending physician, Dr. Rowe Pavy.  Dr. Rowe Pavy will follow on 1/13.   Signed by:  Melton Alar, PA-C  11/12/2015, 9:20 PM  Please contact Palliative Medicine Team phone at [phone] for questions and concerns.

## 2015-11-13 DIAGNOSIS — R1310 Dysphagia, unspecified: Secondary | ICD-10-CM

## 2015-11-13 DIAGNOSIS — Z66 Do not resuscitate: Secondary | ICD-10-CM

## 2015-11-13 LAB — BASIC METABOLIC PANEL
Anion gap: 7 (ref 5–15)
BUN: 30 mg/dL — AB (ref 6–20)
CALCIUM: 8.1 mg/dL — AB (ref 8.9–10.3)
CHLORIDE: 110 mmol/L (ref 101–111)
CO2: 26 mmol/L (ref 22–32)
CREATININE: 1.28 mg/dL — AB (ref 0.44–1.00)
GFR calc Af Amer: 47 mL/min — ABNORMAL LOW (ref >60)
GFR, EST NON AFRICAN AMERICAN: 40 mL/min — AB (ref >60)
Glucose, Bld: 138 mg/dL — ABNORMAL HIGH (ref 65–99)
Potassium: 3.6 mmol/L (ref 3.5–5.1)
SODIUM: 143 mmol/L (ref 135–145)

## 2015-11-13 LAB — GLUCOSE, CAPILLARY
GLUCOSE-CAPILLARY: 136 mg/dL — AB (ref 65–99)
GLUCOSE-CAPILLARY: 126 mg/dL — AB (ref 65–99)
GLUCOSE-CAPILLARY: 116 mg/dL — AB (ref 65–99)
Glucose-Capillary: 118 mg/dL — ABNORMAL HIGH (ref 65–99)
Glucose-Capillary: 159 mg/dL — ABNORMAL HIGH (ref 65–99)
Glucose-Capillary: 106 mg/dL — ABNORMAL HIGH (ref 65–99)

## 2015-11-13 LAB — CBC
HCT: 26.8 % — ABNORMAL LOW (ref 36.0–46.0)
HEMOGLOBIN: 8.9 g/dL — AB (ref 12.0–15.0)
MCH: 31.9 pg (ref 26.0–34.0)
MCHC: 33.2 g/dL (ref 30.0–36.0)
MCV: 96.1 fL (ref 78.0–100.0)
PLATELETS: 216 10*3/uL (ref 150–400)
RBC: 2.79 MIL/uL — ABNORMAL LOW (ref 3.87–5.11)
RDW: 16.5 % — ABNORMAL HIGH (ref 11.5–15.5)
WBC: 7.6 10*3/uL (ref 4.0–10.5)

## 2015-11-13 NOTE — Progress Notes (Signed)
Attempted to get report. No answer when called.

## 2015-11-13 NOTE — Progress Notes (Signed)
Subjective:  More alert and awake today.  Remains in sinus rhythm  Objective:  Vital Signs in the last 24 hours: Temp:  [96.9 F (36.1 C)-98.4 F (36.9 C)] 97.9 F (36.6 C) (01/13 0800) Pulse Rate:  [64-89] 89 (01/13 0800) Resp:  [18-23] 23 (01/13 0800) BP: (124-150)/(58-91) 142/75 mmHg (01/13 0800) SpO2:  [94 %-100 %] 94 % (01/13 0800) Weight:  [68.402 kg (150 lb 12.8 oz)] 68.402 kg (150 lb 12.8 oz) (01/13 0330)  Intake/Output from previous day: 01/12 0701 - 01/13 0700 In: 660 [NG/GT:660] Out: 450 [Urine:450] Intake/Output from this shift:    Physical Exam: Neck: no adenopathy, no carotid bruit, no JVD and supple, symmetrical, trachea midline Lungs: clear to auscultation bilaterally Heart: regular rate and rhythm, S1, S2 normal and 2/6 systolic murmur noted Abdomen: soft, non-tender; bowel sounds normal; no masses,  no organomegaly Extremities: extremities normal, atraumatic, no cyanosis or edema and surgical site dry  Lab Results:  Recent Labs  11/11/15 0102 11/13/15 0537  WBC 11.3* 7.6  HGB 10.7* 8.9*  PLT 226 216    Recent Labs  11/11/15 0102 11/13/15 0537  NA 144 143  K 3.4* 3.6  CL 110 110  CO2 22 26  GLUCOSE 124* 138*  BUN 16 30*  CREATININE 1.10* 1.28*    Recent Labs  11/11/15 1300 11/11/15 1956  TROPONINI 0.04* 0.04*   Hepatic Function Panel No results for input(s): PROT, ALBUMIN, AST, ALT, ALKPHOS, BILITOT, BILIDIR, IBILI in the last 72 hours. No results for input(s): CHOL in the last 72 hours. No results for input(s): PROTIME in the last 72 hours.  Imaging: Imaging results have been reviewed and Dg Abd 1 View  11/11/2015  CLINICAL DATA:  Evaluate NG tube placed EXAM: ABDOMEN - 1 VIEW COMPARISON:  08/23/14 FINDINGS: A weighted feeding tube is identified. The tip is in the expected location of the distal stomach or proximal duodenum. The bowel gas pattern is on unremarkable. No dilated loops of small bowel identified. IMPRESSION: 1. Weighted  feeding tube is either in the distal stomach or proximal duodenum. Electronically Signed   By: Kerby Moors M.D.   On: 11/11/2015 11:31   Ct Head Wo Contrast  11/12/2015  CLINICAL DATA:  Initial evaluation for recent acute mental status change. Recent stroke. EXAM: CT HEAD WITHOUT CONTRAST TECHNIQUE: Contiguous axial images were obtained from the base of the skull through the vertex without intravenous contrast. COMPARISON:  Previous MRI and CT from 10/22/2015. FINDINGS: Study is somewhat limited by patient positioning. Diffuse prominence of the CSF containing spaces is compatible with generalized age-related cerebral atrophy. Patchy and confluent hypodensity within the periventricular and deep white matter most compatible with chronic small vessel ischemic disease. Small vessel type changes within the central pons. Intracranial vascular calcifications noted. Remote lacunar infarcts within the bilateral basal ganglia. These changes are stable relative to prior study. No evidence for acute large vessel territory infarct. No intracranial hemorrhage. No mass lesion, mass effect, or midline shift. Ventricular prominence related to global parenchymal volume loss without hydrocephalus. No extra-axial fluid collection. Scalp soft tissues demonstrate no acute abnormality. Globes and orbits within normal limits. Paranasal sinuses are clear. NG tube in place. No mastoid effusion. Calvarium intact IMPRESSION: 1. No acute intracranial process. Specifically, no evidence for acute or recent ischemia. 2. Stable atrophy with chronic microvascular ischemic disease and remote bilateral lacunar infarcts. Electronically Signed   By: Jeannine Boga M.D.   On: 11/12/2015 20:47   Dg Abd Portable 1v  11/12/2015  CLINICAL DATA:  NG tube placement. EXAM: PORTABLE ABDOMEN - 1 VIEW COMPARISON:  Abdominal radiograph obtained yesterday. FINDINGS: Tip of the weighted enteric tube is unchanged in position in the right upper quadrant,  in the region of the distal stomach or proximal duodenum. Nonobstructive bowel gas pattern. Moderate stool in the right colon. IMPRESSION: Tip of the weighted enteric tube unchanged in position, either in the distal stomach or proximal duodenum. Electronically Signed   By: Jeb Levering M.D.   On: 11/12/2015 21:46    Cardiac Studies:  Assessment/Plan:  Right hip Complex fracture / pubic rami / right-sided sacral fracture status post fall status post intramedullary nail Recurrent paroxysmal A. fib flutter with 2-1 block converted to sinus rhythm chadsvac score of 8 Postop anemia Hypertrophic cardiomyopathy with SAM Severe mitral regurgitation and moderate tricuspid regurgitation Hypertension History of CVA in the past Hyperlipidemia Bilateral carotid artery disease Adenocarcinoma of the left lung Plan Reduce amiodarone to 200 mg twice daily. Increase metoprolol to 50 mg 3 times a day. I will sign off.  Please call if needed  LOS: 9 days    Charolette Forward 11/13/2015, 9:51 AM

## 2015-11-13 NOTE — Progress Notes (Signed)
Speech Language Pathology Treatment: Dysphagia  Patient Details Name: YURI FLENER MRN: [id number] DOB: 19-Feb-1941 Today's Date: 11/13/2015 Time: 0630-1601 SLP Time Calculation (min) (ACUTE ONLY): 45 min  Assessment / Plan / Recommendation Clinical Impression  Pt seen with daughter present. Pt much more alert today, responsive to commands with a delayed response. SLP provided PO trials with max tactile cues to increase pts awareness of bolus. Progressed from ice chips with dry spoon to elicit swallow, finally to consecutive straw sips taken with daughter feeding pt. Pt also tolerating pudding/puree well, but pt could not fully attend to mastication and transit of a soft biscuit. Extensive education provided to daughter and discussed plan with RN.   Pt may initiate a dys 1 (puree) diet and thin liquids, but will need to keep feeding tube to establish adequate and consistent oral intake with meals and meds. It pt does well over 24 hours, consider removing tube. Daughter in agreement with plan. Expect resolution of dysphagia as mentation continues to improve to baseline function. SLP will continue to follow for diet upgrades, but daughter is permitted to try foods from home of choice as mentation improves.     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 Admitted from skilled nursing facility who fell out of wheelchair sustained a right hip fracture and was admitted to [facility] on 1/4. She was also noted to have new A. fib with RVR. She was started on a Cardizem drip and briefly became hypotensive. Drip was subsequently stopped. Dr. Aline Brochure, orthopedics was consulted and felt that patient needed transfer to a higher level of care for anesthesia and cardiology coverage. CXR 11/04/15: Appearance of the lungs is stable from the prior study, reflecting diffuse interstitial lung disease  with fibrotic change, worse at the left lung base. Pt has been seen by SLP in the past, recommended to consume a dys 3 diet and thin liquids, no significant dysphagia. Able to masticate without dentition.       SLP Plan  Continue with current plan of care     Recommendations  Diet recommendations: Dysphagia 1 (puree);Thin liquid Liquids provided via: Straw;Teaspoon Medication Administration: Crushed with puree (ice cream chocolate pudding) Supervision: Staff to assist with self feeding;Trained caregiver to feed patient;Full supervision/cueing for compensatory strategies Compensations: Slow rate;Small sips/bites;Minimize environmental distractions;Monitor for anterior loss Postural Changes and/or Swallow Maneuvers: Seated upright 90 degrees             Oral Care Recommendations: Oral care BID Follow up Recommendations: 24 hour supervision/assistance Plan: Continue with current plan of care     GO                Tangee Marszalek, Katherene Ponto 11/13/2015, 10:40 AM

## 2015-11-13 NOTE — Progress Notes (Signed)
[facility] PROGRESS NOTE   Stephanie [name] QIW:[id number] DOB: 02/03/1941 DOA: 11/04/2015 PCP: [name], MD  Summary 75 year old aphasic white female from skilled nursing facility who fell out of wheelchair sustained a right hip fracture and was admitted to [facility] on 1/4. She was also noted to have new A. fib with RVR. She was started on a Cardizem drip and briefly became hypotensive. Drip was subsequently stopped. Dr. Aline Brochure, orthopedics was consulted and felt that patient needed transfer to a higher level of care for anesthesia and cardiology coverage.  Transferred to [facility]. Cardiology consulted and had IM Nail 1/9.  Failed swallow eval 1/10.  NG tube placed  Assessment/Plan: Principal Problem:   Hip fracture, right ([place]) Active Problems:   Adenocarcinoma of left lung, stage 1 (HCC)   Bilateral carotid artery disease (HCC)   Hyperlipidemia   Hypotension   Dysphagia   CKD (chronic kidney disease), stage III   Sacral fracture, closed (HCC)   Closed right hip fracture (HCC)   Atrial fibrillation (HCC)   right superior and inferior pubic rami   Fracture, intertrochanteric, right femur (HCC)   Postoperative anemia due to acute blood loss   Malnutrition of moderate degree   DNR (do not resuscitate)   Palliative care encounter   Right hip Fracture S/p IM nail.   Right sacral fracture/Right superior and inferior pubic rami fx seen on CT PT eval -from SNF  New onset of paroxysmal A. 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 CHADSVASC 5.  eliquis started -scheduled metoprolol -cards following  Acute postoperative blood loss anemia: s/p 2 units prbc  -trend  Severe MR  HOCM  TIA/stroke -Has history of stroke in 2012, recent history of TIA in December 2016. Dysarthric speech since 2/12  Acute renal failure on CKD stage III.  creatinine stable  History of lung cancer stage I History of lung cancer diagnosed January 2013 with surgical  resection, was T2a, pN0. Continue Iressa, per last oncology note question of progression of adenocarcinoma but not proven by biopsy.  HLD -Continue with statin.  HTN  NG Tube- -SLP following  Palliative care consult- appreciate help with  goals   Code Status: DNR Family Communication: spoke with daughter- [name] 1/11 Disposition Plan: Back to skilled nursing facility once stable?-- tx to floor today  Consultants:  Ortho  Cardiology  Palliative care  Procedures:  IM nail  Antibiotics:  None  Subjective: Eyes open today--- more responsive  Objective: Filed Vitals:   11/12/15 2312 11/13/15 0330  BP: 150/88 125/91  Pulse: 86 74  Temp: 97.3 F (36.3 C) 98.4 F (36.9 C)  Resp: 20 21    Intake/Output Summary (Last 24 hours) at 11/13/15 0802 Last data filed at 11/13/15 0600  Gross per 24 hour  Intake    660 ml  Output    450 ml  Net    210 ml   Filed Weights   11/11/15 0500 11/12/15 0329 11/13/15 0330  Weight: 67.8 kg (149 lb 7.6 oz) 66.8 kg (147 lb 4.3 oz) 68.402 kg (150 lb 12.8 oz)    telemetry: NSR  Exam: General: awake, moving in bed CVS: RRR with 2/6 murmur Chest: clear to auscultation bilaterally, no wheezing, rales or rhonchi Abdomen: soft nontender, nondistended, normal bowel sounds, no organomegaly Extremities: no cyanosis, clubbing or edema noted bilaterally   Data Reviewed: Basic Metabolic Panel:  Recent Labs Lab 11/08/15 0539 11/09/15 0351 11/10/15 0423 11/11/15 0102 11/13/15 0537  NA  140 140 146* 144 143  K 4.2 4.0 4.1 3.4* 3.6  CL 113* 113* 113* 110 110  CO2 21* 19* 21* 22 26  GLUCOSE 112* 109* 98 124* 138*  BUN 21* '17 17 16 '$ 30*  CREATININE 1.38* 1.27* 1.20* 1.10* 1.28*  CALCIUM 8.3* 7.8* 8.1* 8.5* 8.1*   Liver Function Tests: No results for input(s): AST, ALT, ALKPHOS, BILITOT, PROT, ALBUMIN in the last 168 hours. No results for input(s): LIPASE, AMYLASE in the last 168 hours. No results for input(s): AMMONIA in the  last 168 hours. CBC:  Recent Labs Lab 11/09/15 0351 11/10/15 0423 11/11/15 0102 11/13/15 0537  WBC 7.6 9.7 11.3* 7.6  HGB 5.9* 10.3* 10.7* 8.9*  HCT 17.7* 31.0* 31.8* 26.8*  MCV 98.9 95.1 91.9 96.1  PLT 155 161 226 216   Cardiac Enzymes:  Recent Labs Lab 11/11/15 0102 11/11/15 0554 11/11/15 1300 11/11/15 1956  TROPONINI 0.04* 0.06* 0.04* 0.04*   BNP (last 3 results) No results for input(s): BNP in the last 8760 hours.  ProBNP (last 3 results) No results for input(s): PROBNP in the last 8760 hours.  CBG:  Recent Labs Lab 11/12/15 1153 11/12/15 1607 11/12/15 1920 11/12/15 2311 11/13/15 0327  GLUCAP 128* 136* 146* 100* 136*    Micro No results found for this or any previous visit (from the past 240 hour(s)).   Studies: Dg Abd 1 View  11/11/2015  CLINICAL DATA:  Evaluate NG tube placed EXAM: ABDOMEN - 1 VIEW COMPARISON:  08/23/14 FINDINGS: A weighted feeding tube is identified. The tip is in the expected location of the distal stomach or proximal duodenum. The bowel gas pattern is on unremarkable. No dilated loops of small bowel identified. IMPRESSION: 1. Weighted feeding tube is either in the distal stomach or proximal duodenum. Electronically Signed   By: Kerby Moors M.D.   On: 11/11/2015 11:31   Ct Head Wo Contrast  11/12/2015  CLINICAL DATA:  Initial evaluation for recent acute mental status change. Recent stroke. EXAM: CT HEAD WITHOUT CONTRAST TECHNIQUE: Contiguous axial images were obtained from the base of the skull through the vertex without intravenous contrast. COMPARISON:  Previous MRI and CT from 10/22/2015. FINDINGS: Study is somewhat limited by patient positioning. Diffuse prominence of the CSF containing spaces is compatible with generalized age-related cerebral atrophy. Patchy and confluent hypodensity within the periventricular and deep white matter most compatible with chronic small vessel ischemic disease. Small vessel type changes within the  central pons. Intracranial vascular calcifications noted. Remote lacunar infarcts within the bilateral basal ganglia. These changes are stable relative to prior study. No evidence for acute large vessel territory infarct. No intracranial hemorrhage. No mass lesion, mass effect, or midline shift. Ventricular prominence related to global parenchymal volume loss without hydrocephalus. No extra-axial fluid collection. Scalp soft tissues demonstrate no acute abnormality. Globes and orbits within normal limits. Paranasal sinuses are clear. NG tube in place. No mastoid effusion. Calvarium intact IMPRESSION: 1. No acute intracranial process. Specifically, no evidence for acute or recent ischemia. 2. Stable atrophy with chronic microvascular ischemic disease and remote bilateral lacunar infarcts. Electronically Signed   By: Jeannine Boga M.D.   On: 11/12/2015 20:47   Dg Abd Portable 1v  11/12/2015  CLINICAL DATA:  NG tube placement. EXAM: PORTABLE ABDOMEN - 1 VIEW COMPARISON:  Abdominal radiograph obtained yesterday. FINDINGS: Tip of the weighted enteric tube is unchanged in position in the right upper quadrant, in the region of the distal stomach or proximal duodenum. Nonobstructive bowel gas  pattern. Moderate stool in the right colon. IMPRESSION: Tip of the weighted enteric tube unchanged in position, either in the distal stomach or proximal duodenum. Electronically Signed   By: Jeb Levering M.D.   On: 11/12/2015 21:46    Scheduled Meds: . acetaminophen (TYLENOL) oral liquid 160 mg/5 mL  500 mg Oral Q8H  . amiodarone  400 mg Per Tube BID  . antiseptic oral rinse  7 mL Mouth Rinse BID  . apixaban  5 mg Oral BID  . feeding supplement (PRO-STAT SUGAR FREE 64)  30 mL Per Tube Daily  . metoprolol tartrate  50 mg Per Tube BID  . sodium chloride  3 mL Intravenous Q12H   Continuous Infusions: . feeding supplement (JEVITY 1.2 CAL) 1,000 mL (11/12/15 1650)   Time spent: 25 minutes  [facility] [email or web address], password [facility] 11/13/2015, 8:02 AM  LOS: 9 days

## 2015-11-13 NOTE — Progress Notes (Signed)
Patient seems to wax and wane on what she can do with eating and drinking. I gave patient's PRN tramadol crushed and in pudding but patient had difficulty swallowing. Pt was unable to suck on the straw when attempting to drink her favorite drink Coke. Patient needed the cola to be spoon fed to her. Pt also coughed while trying to drink.

## 2015-11-14 LAB — GLUCOSE, CAPILLARY
GLUCOSE-CAPILLARY: 129 mg/dL — AB (ref 65–99)
GLUCOSE-CAPILLARY: 100 mg/dL — AB (ref 65–99)
Glucose-Capillary: 102 mg/dL — ABNORMAL HIGH (ref 65–99)
Glucose-Capillary: 157 mg/dL — ABNORMAL HIGH (ref 65–99)
Glucose-Capillary: 141 mg/dL — ABNORMAL HIGH (ref 65–99)

## 2015-11-14 MED ORDER — MORPHINE SULFATE (PF) 2 MG/ML IV SOLN
1.0000 mg | INTRAVENOUS | Status: DC | PRN
  Administered 2015-11-15 (×2): 2 mg via INTRAVENOUS
  Filled 2015-11-14 (×2): qty 1

## 2015-11-14 MED ORDER — MORPHINE SULFATE (PF) 4 MG/ML IV SOLN
4.0000 mg | Freq: Once | INTRAVENOUS | Status: AC
  Administered 2015-11-14: 4 mg via INTRAVENOUS
  Filled 2015-11-14: qty 1

## 2015-11-14 NOTE — Progress Notes (Signed)
Daily Progress Note   Patient Name: Stephanie [name]       Date: 11/14/2015 DOB: June 06, 1941  Age: 75 y.o. MRN#: [id number] Attending Physician: Geradine Girt, DO Primary Care Physician: [name], MD Admit Date: 11/04/2015  Reason for Consultation/Follow-up: Establishing goals of care  Subjective:  NGT D/C Is awake, family states more alert, in no distress.   Interval Events:  Met with patient's 2 daughters and brother present in the room. Discussed current hospital course. Family is encouraged that the NG tube is discontinued. They state that patient pulled it out herself. They are continuing with recommended diet of nectar thick liquids as prescribed by speech language pathology recommendations. They state that the patient has had reasonable oral intake. They have not  Witnessed any significant coughing or choking spells. They hope that the patient will continue to rehabilitate and be able to be transferred to [facility] towards the end of this hospitalization.  Palliative will follow hospital course and help guide with appropriate decision-making. Continue current course for now. Length of Stay: 10 days  Current Medications: Scheduled Meds:  . acetaminophen (TYLENOL) oral liquid 160 mg/5 mL  500 mg Oral Q8H  . amiodarone  400 mg Per Tube BID  . antiseptic oral rinse  7 mL Mouth Rinse BID  . apixaban  5 mg Oral BID  . feeding supplement (PRO-STAT SUGAR FREE 64)  30 mL Per Tube Daily  . metoprolol tartrate  50 mg Per Tube BID    Continuous Infusions: . feeding supplement (JEVITY 1.2 CAL) 1,000 mL (11/13/15 1800)    PRN Meds: acetaminophen **OR** acetaminophen, menthol-cetylpyridinium **OR** phenol, morphine injection, ondansetron **OR** ondansetron (ZOFRAN) IV, senna-docusate,  traMADol  Physical Exam: Physical Exam             Frail weak pale appearing lady She is able to say yes/no Following some commands Tracks me in the room Lungs clear anteriorly S1 S21 No edema  Vital Signs: BP 118/52 mmHg  Pulse 72  Temp(Src) 98.2 F (36.8 C) (Oral)  Resp 19  Ht 5' 7"  (1.702 m)  Wt 68.402 kg (150 lb 12.8 oz)  BMI 23.61 kg/m2  SpO2 94% SpO2: SpO2: 94 % O2 Device: O2 Device: Not Delivered O2 Flow Rate: O2 Flow Rate (L/min): 2 L/min  Intake/output summary:  Intake/Output  Summary (Last 24 hours) at 11/14/15 1639 Last data filed at 11/14/15 5784  Gross per 24 hour  Intake      0 ml  Output      0 ml  Net      0 ml   LBM: Last BM Date: 11/13/15 Baseline Weight: Weight: 63.504 kg (140 lb) Most recent weight: Weight: 68.402 kg (150 lb 12.8 oz)       Palliative Assessment/Data: Flowsheet Rows        Most Recent Value   Intake Tab    Referral Department  Hospitalist   Unit at Time of Referral  Intermediate Care Unit   Palliative Care Primary Diagnosis  Pain   Date Notified  11/11/15   Palliative Care Type  New Palliative care   Reason for referral  Clarify Goals of Care, Pain   Date of Admission  11/04/15   Date first seen by Palliative Care  11/12/15   # of days Palliative referral response time  1 Day(s)   # of days IP prior to Palliative referral  7   Clinical Assessment    Palliative Performance Scale Score  20%   Pain Max last 24 hours  10   Pain Min Last 24 hours  3   Anxiety Max Last 24 Hours  8   Anxiety Min Last 24 Hours  3   Psychosocial & Spiritual Assessment    Social Work Plan of Care  Clarified patient/family wishes with healthcare team   Palliative Care Outcomes    Patient/Family meeting held?  Yes   Who was at the meeting?  daughters [name] and [place] Outcomes  Improved pain interventions, Clarified goals of care   Patient/Family wishes: Interventions discontinued/not started   Mechanical Ventilation, PEG    Palliative Care follow-up planned  Yes, Facility      Additional Data Reviewed: CBC    Component Value Date/Time   WBC 7.6 11/13/2015 0537   RBC 2.79* 11/13/2015 0537   HGB 8.9* 11/13/2015 0537   HCT 26.8* 11/13/2015 0537   PLT 216 11/13/2015 0537   MCV 96.1 11/13/2015 0537   MCH 31.9 11/13/2015 0537   MCHC 33.2 11/13/2015 0537   RDW 16.5* 11/13/2015 0537   LYMPHSABS 1.0 11/04/2015 2000   MONOABS 0.7 11/04/2015 2000   EOSABS 0.2 11/04/2015 2000   BASOSABS 0.0 11/04/2015 2000    CMP     Component Value Date/Time   NA 143 11/13/2015 0537   K 3.6 11/13/2015 0537   CL 110 11/13/2015 0537   CO2 26 11/13/2015 0537   GLUCOSE 138* 11/13/2015 0537   BUN 30* 11/13/2015 0537   CREATININE 1.28* 11/13/2015 0537   CALCIUM 8.1* 11/13/2015 0537   PROT 7.5 11/04/2015 2000   ALBUMIN 3.5 11/04/2015 2000   AST 27 11/04/2015 2000   ALT 19 11/04/2015 2000   ALKPHOS 96 11/04/2015 2000   BILITOT 0.8 11/04/2015 2000   GFRNONAA 40* 11/13/2015 0537   GFRAA 47* 11/13/2015 0537       Problem List:  Patient Active Problem List   Diagnosis Date Noted  . Malnutrition of moderate degree 11/12/2015  . DNR (do not resuscitate) 11/12/2015  . Palliative care encounter 11/12/2015  . Postoperative anemia due to acute blood loss 11/09/2015  . Fracture, intertrochanteric, right femur ([place]) 11/08/2015  . right superior and inferior pubic rami 11/07/2015  . Closed right hip fracture ([place])   . Atrial fibrillation ([place])   . Hip fracture, right (  [place]) 11/04/2015  . CKD (chronic kidney disease), stage III 11/04/2015  . Sacral fracture, closed ([place]) 11/04/2015  . Chest pain 10/31/2015  . TIA (transient ischemic attack) 10/22/2015  . Dysphagia 10/22/2015  . Rash and nonspecific skin eruption 09/22/2015  . Cellulitis 07/31/2015  . Low back pain 07/22/2015  . Back pain 04/14/2015  . UTI (urinary tract infection) 04/14/2015  . History of CVA (cerebrovascular accident) 04/14/2015  . Mental status  change 03/16/2015  . Hypotension 03/16/2015  . Acute encephalopathy 03/16/2015  . Encephalopathy acute 03/16/2015  . Altered mental status   . Arterial hypotension   . Cough 11/10/2014  . Anemia 11/10/2014  . Dyspnea 10/16/2014  . Acute on chronic renal failure ([place]) 10/16/2014  . Bronchitis 10/15/2014  . Diastolic CHF, acute ([place]) 10/15/2014  . Hyperlipidemia 05/20/2014  . Lack of coordination 02/19/2013  . Muscle weakness (generalized) 02/19/2013  . Dysarthria as late effect of stroke 02/04/2013  . Bilateral carotid artery disease ([place]) 02/04/2013  . Other and unspecified hyperlipidemia 02/04/2013  . Adenocarcinoma of left lung, stage 1 ([place]) 10/19/2011  . Pneumothorax of left lung after biopsy 10/14/2011    Class: Acute  . CVA (cerebral infarction) 09/17/2011  . HTN (hypertension) 09/17/2011     Palliative Care Assessment & Plan    1.Code Status:  DNR    Code Status Orders        Start     Ordered   11/08/15 1407  Do not attempt resuscitation (DNR)   Continuous    Question Answer Comment  In the event of cardiac or respiratory ARREST Do not call a "code blue"   In the event of cardiac or respiratory ARREST Do not perform Intubation, CPR, defibrillation or ACLS   In the event of cardiac or respiratory ARREST Use medication by any route, position, wound care, and other measures to relive pain and suffering. May use oxygen, suction and manual treatment of airway obstruction as needed for comfort.      11/08/15 1406    Code Status History    Date Active Date Inactive Code Status Order ID Comments User Context   11/05/2015 12:55 AM 11/08/2015  2:06 PM DNR [id number]  Orvan Falconer, MD Inpatient   11/04/2015 10:44 PM 11/05/2015 12:55 AM DNR [id number]  Orvan Falconer, MD ED   10/23/2015  7:27 AM 10/23/2015  4:53 PM DNR [id number]  Orvan Falconer, MD Inpatient   10/22/2015  4:04 PM 10/23/2015  7:27 AM Full Code [id number]  Orvan Falconer, MD Inpatient   10/22/2015  2:48 PM 10/22/2015  4:04 PM DNR  [id number]  Orvan Falconer, MD ED   03/16/2015  4:18 PM 03/17/2015  3:54 PM DNR [id number]  Radene Gunning, NP ED   10/15/2014 11:43 PM 10/17/2014  9:00 PM DNR [id number]  Oswald Hillock, MD Inpatient   11/18/2011  5:55 PM 11/23/2011  2:17 PM Full Code [id number]  Connye Burkitt, RN Inpatient   10/14/2011  9:21 PM 10/19/2011 12:55 PM Full Code [id number]  Beau Fanny, RN Inpatient    Advance Directive Documentation        Most Recent Value   Type of Advance Directive  Out of facility DNR (pink MOST or yellow form)   Pre-existing out of facility DNR order (yellow form or pink MOST form)  Physician notified to receive inpatient order, Yellow form placed in chart (order not valid for inpatient use)   "MOST" Form in Place?         2.  Goals of Care/Additional Recommendations:     Limitations on Scope of Treatment:  continue current scope treatment  Desire for further Chaplaincy support:no  Psycho-social Needs: Caregiving  Support/Resources  3. Symptom Management:      1. As above  4. Palliative Prophylaxis:   Delirium Protocol  5. Prognosis: < 4 weeks  6. Discharge Planning:   [place] was discussed with  Patient, 2 daughters and patient's brother at bedside.   Thank you for allowing the Palliative Medicine Team to assist in the care of this patient.   Time In: 1500 Time Out: 1530 Total Time 30 Prolonged Time Billed  no        [phone] Loistine Chance, MD  11/14/2015, 4:39 PM  Please contact Palliative Medicine Team phone at [phone] for questions and concerns.

## 2015-11-14 NOTE — Progress Notes (Signed)
Speech Language Pathology Treatment: Dysphagia  Patient Details Name: Stephanie [name] MRN: [id number] DOB: 10-22-1941 Today's Date: 11/14/2015 Time: 2353-6144 SLP Time Calculation (min) (ACUTE ONLY): 28 min  Assessment / Plan / Recommendation Clinical Impression  Since last SLP visit, daughter reports pt pulled out NG tube this morning. Per epic, pt with documented coughing episodes with thin liquids since diet implementation and also displayed consistent coughing this date with thin liquid PO trials. Note impuslivity in sip size however coughing persisted with smaller boluses via straw, cup, and teaspoon sip, concerning for poor airway protection. Pt without overt signs or symptoms of aspiration with nectar thick liquid trials. Pt with reduced bolus cohesion when given mechanical soft PO texture with remaining oral residuals.  Daughter present during session for extensive swallow edcuation including pts aspiration risk and safe swallow strategies. Daughter in agreement for diet downgrade to nectar thick liquids, however pallative involvement noted. If pts PO intake reduces with diet modification, comfort feeding may be indicated including liberalization of diet. ST to follow up.     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 Admitted from skilled nursing facility who fell out of wheelchair sustained a right hip fracture and was admitted to [facility] on 1/4. She was also noted to have new A. fib with RVR. She was started on a Cardizem drip and briefly became hypotensive. Drip was subsequently stopped. Dr. Aline Brochure, orthopedics was consulted and felt that patient needed transfer to a higher level of care for anesthesia and cardiology coverage. CXR 11/04/15: Appearance of the lungs is stable from the prior study, reflecting diffuse interstitial lung disease with fibrotic change, worse at  the left lung base.      SLP Plan  Continue with current plan of care     Recommendations  Diet recommendations: Nectar-thick liquid;Dysphagia 1 (puree) Liquids provided via: Straw;Teaspoon Medication Administration: Crushed with puree Supervision: Staff to assist with self feeding;Trained caregiver to feed patient;Full supervision/cueing for compensatory strategies Compensations: Slow rate;Small sips/bites Postural Changes and/or Swallow Maneuvers: Seated upright 90 degrees             Oral Care Recommendations: Oral care BID Follow up Recommendations: Skilled Nursing facility;24 hour supervision/assistance Plan: Continue with current plan of care     [place] MA, [place] Pathologist    Levi Aland 11/14/2015, 12:47 PM

## 2015-11-14 NOTE — Progress Notes (Signed)
[facility] PROGRESS NOTE   Stephanie [name] [id number]:[id number] DOB: Nov 22, 1940 DOA: 11/04/2015 PCP: [name], MD  Summary 75 year old aphasic white female from skilled nursing facility who fell out of wheelchair sustained a right hip fracture and was admitted to [facility] on 1/4. She was also noted to have new A. fib with RVR. She was started on a Cardizem drip and briefly became hypotensive. Drip was subsequently stopped. Dr. Aline Brochure, orthopedics was consulted and felt that patient needed transfer to a higher level of care for anesthesia and cardiology coverage.  Transferred to [facility]. Cardiology consulted and had IM Nail 1/9.  Failed swallow eval 1/10.  NG tube placed  Assessment/Plan: Principal Problem:   Hip fracture, right ([place]) Active Problems:   Adenocarcinoma of left lung, stage 1 (HCC)   Bilateral carotid artery disease (HCC)   Hyperlipidemia   Hypotension   Dysphagia   CKD (chronic kidney disease), stage III   Sacral fracture, closed (HCC)   Closed right hip fracture (HCC)   Atrial fibrillation (HCC)   right superior and inferior pubic rami   Fracture, intertrochanteric, right femur (HCC)   Postoperative anemia due to acute blood loss   Malnutrition of moderate degree   DNR (do not resuscitate)   Palliative care encounter   Right hip Fracture S/p IM nail.   Right sacral fracture/Right superior and inferior pubic rami fx seen on CT PT eval -from SNF  New onset of paroxysmal A. 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 CHADSVASC 5.  eliquis started -scheduled metoprolol -cards following  Acute postoperative blood loss anemia: s/p 2 units prbc  -trend  Severe MR  HOCM  TIA/stroke -Has history of stroke in 2012, recent history of TIA in December 2016. Dysarthric speech since 2/12  Acute renal failure on CKD stage III.  creatinine stable  History of lung cancer stage I History of lung cancer diagnosed January 2013 with surgical  resection, was T2a, pN0. Continue Iressa, per last oncology note question of progression of adenocarcinoma but not proven by biopsy.  HLD -Continue with statin.  HTN  NG Tube- -SLP following  Palliative care consult- appreciate help with  goals   Code Status: DNR Family Communication: spoke with daughter- [name]  Disposition Plan: Back to skilled nursing facility once stable  Consultants:  Ortho  Cardiology  Palliative care  Procedures:  IM nail  Antibiotics:  None  Subjective: Pulled NG tube- ate 100% of breakfast  Objective: Filed Vitals:   11/14/15 0520 11/14/15 1250  BP: 156/89 118/52  Pulse: 82 72  Temp:    Resp: 19     Intake/Output Summary (Last 24 hours) at 11/14/15 1609 Last data filed at 11/14/15 0923  Gross per 24 hour  Intake      0 ml  Output      0 ml  Net      0 ml   Filed Weights   11/11/15 0500 11/12/15 0329 11/13/15 0330  Weight: 67.8 kg (149 lb 7.6 oz) 66.8 kg (147 lb 4.3 oz) 68.402 kg (150 lb 12.8 oz)    telemetry: NSR  Exam: General: awake, moving in bed CVS: RRR with 2/6 murmur Chest: clear to auscultation bilaterally, no wheezing, rales or rhonchi Abdomen: soft nontender, nondistended, normal bowel sounds, no organomegaly Extremities: no cyanosis, clubbing or edema noted bilaterally   Data Reviewed: Basic Metabolic Panel:  Recent Labs Lab 11/08/15 0539 11/09/15 0351 11/10/15 0423 11/11/15 0102 11/13/15 0537  NA 140 140  146* 144 143  K 4.2 4.0 4.1 3.4* 3.6  CL 113* 113* 113* 110 110  CO2 21* 19* 21* 22 26  GLUCOSE 112* 109* 98 124* 138*  BUN 21* '17 17 16 '$ 30*  CREATININE 1.38* 1.27* 1.20* 1.10* 1.28*  CALCIUM 8.3* 7.8* 8.1* 8.5* 8.1*   Liver Function Tests: No results for input(s): AST, ALT, ALKPHOS, BILITOT, PROT, ALBUMIN in the last 168 hours. No results for input(s): LIPASE, AMYLASE in the last 168 hours. No results for input(s): AMMONIA in the last 168 hours. CBC:  Recent Labs Lab 11/09/15 0351  11/10/15 0423 11/11/15 0102 11/13/15 0537  WBC 7.6 9.7 11.3* 7.6  HGB 5.9* 10.3* 10.7* 8.9*  HCT 17.7* 31.0* 31.8* 26.8*  MCV 98.9 95.1 91.9 96.1  PLT 155 161 226 216   Cardiac Enzymes:  Recent Labs Lab 11/11/15 0102 11/11/15 0554 11/11/15 1300 11/11/15 1956  TROPONINI 0.04* 0.06* 0.04* 0.04*   BNP (last 3 results) No results for input(s): BNP in the last 8760 hours.  ProBNP (last 3 results) No results for input(s): PROBNP in the last 8760 hours.  CBG:  Recent Labs Lab 11/13/15 1936 11/13/15 2258 11/14/15 0448 11/14/15 0800 11/14/15 1203  GLUCAP 159* 106* 129* 102* 157*    Micro No results found for this or any previous visit (from the past 240 hour(s)).   Studies: Ct Head Wo Contrast  11/12/2015  CLINICAL DATA:  Initial evaluation for recent acute mental status change. Recent stroke. EXAM: CT HEAD WITHOUT CONTRAST TECHNIQUE: Contiguous axial images were obtained from the base of the skull through the vertex without intravenous contrast. COMPARISON:  Previous MRI and CT from 10/22/2015. FINDINGS: Study is somewhat limited by patient positioning. Diffuse prominence of the CSF containing spaces is compatible with generalized age-related cerebral atrophy. Patchy and confluent hypodensity within the periventricular and deep white matter most compatible with chronic small vessel ischemic disease. Small vessel type changes within the central pons. Intracranial vascular calcifications noted. Remote lacunar infarcts within the bilateral basal ganglia. These changes are stable relative to prior study. No evidence for acute large vessel territory infarct. No intracranial hemorrhage. No mass lesion, mass effect, or midline shift. Ventricular prominence related to global parenchymal volume loss without hydrocephalus. No extra-axial fluid collection. Scalp soft tissues demonstrate no acute abnormality. Globes and orbits within normal limits. Paranasal sinuses are clear. NG tube in  place. No mastoid effusion. Calvarium intact IMPRESSION: 1. No acute intracranial process. Specifically, no evidence for acute or recent ischemia. 2. Stable atrophy with chronic microvascular ischemic disease and remote bilateral lacunar infarcts. Electronically Signed   By: Jeannine Boga M.D.   On: 11/12/2015 20:47   Dg Abd Portable 1v  11/12/2015  CLINICAL DATA:  NG tube placement. EXAM: PORTABLE ABDOMEN - 1 VIEW COMPARISON:  Abdominal radiograph obtained yesterday. FINDINGS: Tip of the weighted enteric tube is unchanged in position in the right upper quadrant, in the region of the distal stomach or proximal duodenum. Nonobstructive bowel gas pattern. Moderate stool in the right colon. IMPRESSION: Tip of the weighted enteric tube unchanged in position, either in the distal stomach or proximal duodenum. Electronically Signed   By: Jeb Levering M.D.   On: 11/12/2015 21:46    Scheduled Meds: . acetaminophen (TYLENOL) oral liquid 160 mg/5 mL  500 mg Oral Q8H  . amiodarone  400 mg Per Tube BID  . antiseptic oral rinse  7 mL Mouth Rinse BID  . apixaban  5 mg Oral BID  . feeding supplement (  PRO-STAT SUGAR FREE 64)  30 mL Per Tube Daily  . metoprolol tartrate  50 mg Per Tube BID   Continuous Infusions: . feeding supplement (JEVITY 1.2 CAL) 1,000 mL (11/13/15 1800)   Time spent: 25 minutes  [place] [facility] [email or web address], password [facility] 11/14/2015, 4:09 PM  LOS: 10 days

## 2015-11-15 LAB — GLUCOSE, CAPILLARY
GLUCOSE-CAPILLARY: 127 mg/dL — AB (ref 65–99)
GLUCOSE-CAPILLARY: 103 mg/dL — AB (ref 65–99)
GLUCOSE-CAPILLARY: 119 mg/dL — AB (ref 65–99)
Glucose-Capillary: 107 mg/dL — ABNORMAL HIGH (ref 65–99)
Glucose-Capillary: 79 mg/dL (ref 65–99)

## 2015-11-15 MED ORDER — RESOURCE THICKENUP CLEAR PO POWD
ORAL | Status: DC | PRN
  Filled 2015-11-15: qty 125

## 2015-11-15 NOTE — Progress Notes (Signed)
[facility] PROGRESS NOTE   EDILIA GHUMAN SWF:[id number] DOB: 1941-08-21 DOA: 11/04/2015 PCP: [name], MD  Summary 75 year old aphasic white female from skilled nursing facility who fell out of wheelchair sustained a right hip fracture and was admitted to [facility] on 1/4. She was also noted to have new A. fib with RVR. She was started on a Cardizem drip and briefly became hypotensive. Drip was subsequently stopped. Dr. Aline Brochure, orthopedics was consulted and felt that patient needed transfer to a higher level of care for anesthesia and cardiology coverage.  Transferred to [facility]. Cardiology consulted and had IM Nail 1/9.  Failed swallow eval 1/10.  NG tube placed  Assessment/Plan: Principal Problem:   Hip fracture, right ([place]) Active Problems:   Adenocarcinoma of left lung, stage 1 (HCC)   Bilateral carotid artery disease (HCC)   Hyperlipidemia   Hypotension   Dysphagia   CKD (chronic kidney disease), stage III   Sacral fracture, closed (HCC)   Closed right hip fracture (HCC)   Atrial fibrillation (HCC)   right superior and inferior pubic rami   Fracture, intertrochanteric, right femur (HCC)   Postoperative anemia due to acute blood loss   Malnutrition of moderate degree   DNR (do not resuscitate)   Palliative care encounter   Right hip Fracture S/p IM nail.   Right sacral fracture/Right superior and inferior pubic rami fx seen on CT PT eval -from SNF  New onset of paroxysmal A. 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 CHADSVASC 5.  eliquis started -scheduled metoprolol -cards following  Acute postoperative blood loss anemia: s/p 2 units prbc  -trend  Severe MR  HOCM  TIA/stroke -Has history of stroke in 2012, recent history of TIA in December 2016. Dysarthric speech since 2/12  Acute renal failure on CKD stage III.  creatinine stable  History of lung cancer stage I History of lung cancer diagnosed January 2013 with surgical  resection, was T2a, pN0. Continue Iressa, per last oncology note question of progression of adenocarcinoma but not proven by biopsy.  HLD -Continue with statin.  HTN  NG Tube- -SLP following  Palliative care consult- appreciate help with  goals   Code Status: DNR Family Communication: spoke with daughter- [name] 1/14 Disposition Plan: Back to skilled nursing facility once stable  Consultants:  Ortho  Cardiology  Palliative care  Procedures:  IM nail  Antibiotics:  None  Subjective: Like ice cream-- more communicative  Objective: Filed Vitals:   11/15/15 0500 11/15/15 1126  BP: 130/57 112/42  Pulse: 73 70  Temp: 97.5 F (36.4 C)   Resp: 18    No intake or output data in the 24 hours ending 11/15/15 1406 Filed Weights   11/12/15 0329 11/13/15 0330 11/15/15 0730  Weight: 66.8 kg (147 lb 4.3 oz) 68.402 kg (150 lb 12.8 oz) 70.5 kg (155 lb 6.8 oz)     Exam: General: awake, moving in bed CVS: RRR with 2/6 murmur Chest: clear to auscultation bilaterally, no wheezing, rales or rhonchi Abdomen: soft nontender, nondistended, normal bowel sounds, no organomegaly Extremities: no cyanosis, clubbing or edema noted bilaterally   Data Reviewed: Basic Metabolic Panel:  Recent Labs Lab 11/09/15 0351 11/10/15 0423 11/11/15 0102 11/13/15 0537  NA 140 146* 144 143  K 4.0 4.1 3.4* 3.6  CL 113* 113* 110 110  CO2 19* 21* 22 26  GLUCOSE 109* 98 124* 138*  BUN '17 17 16 '$ 30*  CREATININE 1.27* 1.20* 1.10* 1.28*  CALCIUM  7.8* 8.1* 8.5* 8.1*   Liver Function Tests: No results for input(s): AST, ALT, ALKPHOS, BILITOT, PROT, ALBUMIN in the last 168 hours. No results for input(s): LIPASE, AMYLASE in the last 168 hours. No results for input(s): AMMONIA in the last 168 hours. CBC:  Recent Labs Lab 11/09/15 0351 11/10/15 0423 11/11/15 0102 11/13/15 0537  WBC 7.6 9.7 11.3* 7.6  HGB 5.9* 10.3* 10.7* 8.9*  HCT 17.7* 31.0* 31.8* 26.8*  MCV 98.9 95.1 91.9 96.1   PLT 155 161 226 216   Cardiac Enzymes:  Recent Labs Lab 11/11/15 0102 11/11/15 0554 11/11/15 1300 11/11/15 1956  TROPONINI 0.04* 0.06* 0.04* 0.04*   BNP (last 3 results) No results for input(s): BNP in the last 8760 hours.  ProBNP (last 3 results) No results for input(s): PROBNP in the last 8760 hours.  CBG:  Recent Labs Lab 11/14/15 1700 11/14/15 2024 11/15/15 0014 11/15/15 0757 11/15/15 1152  GLUCAP 100* 141* 79 127* 103*    Micro No results found for this or any previous visit (from the past 240 hour(s)).   Studies: No results found.  Scheduled Meds: . acetaminophen (TYLENOL) oral liquid 160 mg/5 mL  500 mg Oral Q8H  . amiodarone  400 mg Per Tube BID  . antiseptic oral rinse  7 mL Mouth Rinse BID  . apixaban  5 mg Oral BID  . feeding supplement (PRO-STAT SUGAR FREE 64)  30 mL Per Tube Daily  . metoprolol tartrate  50 mg Per Tube BID   Continuous Infusions: . feeding supplement (JEVITY 1.2 CAL) 1,000 mL (11/13/15 1800)   Time spent: 25 minutes  [place] [facility] [email or web address], password [facility] 11/15/2015, 2:06 PM  LOS: 11 days

## 2015-11-16 LAB — GLUCOSE, CAPILLARY
GLUCOSE-CAPILLARY: 106 mg/dL — AB (ref 65–99)
GLUCOSE-CAPILLARY: 102 mg/dL — AB (ref 65–99)
GLUCOSE-CAPILLARY: 130 mg/dL — AB (ref 65–99)
Glucose-Capillary: 101 mg/dL — ABNORMAL HIGH (ref 65–99)
Glucose-Capillary: 110 mg/dL — ABNORMAL HIGH (ref 65–99)

## 2015-11-16 MED ORDER — MORPHINE SULFATE (PF) 2 MG/ML IV SOLN
INTRAVENOUS | Status: AC
  Filled 2015-11-16: qty 1

## 2015-11-16 MED ORDER — HYDROCODONE-ACETAMINOPHEN 5-325 MG PO TABS: 1.0000 | ORAL_TABLET | ORAL | Status: DC | PRN

## 2015-11-16 MED ORDER — OXYCODONE HCL 5 MG PO TABS
5.0000 mg | ORAL_TABLET | ORAL | Status: DC | PRN
  Administered 2015-11-16: 5 mg via ORAL
  Administered 2015-11-17: 10 mg via ORAL
  Administered 2015-11-17 (×2): 5 mg via ORAL
  Filled 2015-11-16: qty 2
  Filled 2015-11-16 (×3): qty 1

## 2015-11-16 MED ORDER — ACETAMINOPHEN 500 MG PO TABS
500.0000 mg | ORAL_TABLET | Freq: Three times a day (TID) | ORAL | Status: DC
Start: 2015-11-16 — End: 2015-11-17
  Administered 2015-11-16 – 2015-11-17 (×4): 500 mg via ORAL
  Filled 2015-11-16 (×4): qty 1

## 2015-11-16 MED ORDER — MORPHINE SULFATE (PF) 2 MG/ML IV SOLN
INTRAVENOUS | Status: AC
  Administered 2015-11-16: 2 mg via INTRAVENOUS
  Filled 2015-11-16: qty 1

## 2015-11-16 MED ORDER — MORPHINE SULFATE (PF) 2 MG/ML IV SOLN
2.0000 mg | Freq: Once | INTRAVENOUS | Status: AC
  Administered 2015-11-16: 2 mg via INTRAVENOUS

## 2015-11-16 NOTE — Progress Notes (Signed)
Speech Language Pathology Treatment: Dysphagia  Patient Details Name: KEYANAH KOZICKI MRN: [id number] DOB: 02-Nov-1940 Today's Date: 11/16/2015 Time: 0940-1000 SLP Time Calculation (min) (ACUTE ONLY): 20 min  Assessment / Plan / Recommendation Clinical Impression  Pt demonstrates stable function as seen in prior session, still requiring max tactile cueing for oral closure on cup. Some wet vocal quality noted at end of session, pt cleared throat and swallowed again with verbal cues. Verbal instruction provided to staff as pt is challenging to feed. Oral care is needed prior to PO due to open mouth breathing and dry lingual residuals. Pt is also much more responsive to sweet flavors. Will continue to follow for tolerance of dys 1/nectar thick liquids.    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 Admitted from skilled nursing facility who fell out of wheelchair sustained a right hip fracture and was admitted to [facility] on 1/4. She was also noted to have new A. fib with RVR. She was started on a Cardizem drip and briefly became hypotensive. Drip was subsequently stopped. Dr. Aline Brochure, orthopedics was consulted and felt that patient needed transfer to a higher level of care for anesthesia and cardiology coverage. 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      SLP Plan  Continue with current plan of care     Recommendations  Diet recommendations: Dysphagia 1 (puree);Nectar-thick liquid Liquids provided via: Straw;Teaspoon Medication Administration: Crushed with puree Supervision: Staff to assist with self feeding;Trained caregiver to feed patient;Full supervision/cueing for compensatory strategies Compensations: Slow rate;Small sips/bites Postural Changes  and/or Swallow Maneuvers: Seated upright 90 degrees             Oral Care Recommendations: Oral care BID Follow up Recommendations: Skilled Nursing facility;24 hour supervision/assistance Plan: Continue with current plan of care     [place] Recendez, MA CCC-SLP [phone]  Lynann Beaver 11/16/2015, 11:57 AM

## 2015-11-16 NOTE — Care Management Note (Signed)
Case Management Note  Patient Details  Name: Stephanie [name] MRN: [id number] Date of Birth: April 24, 1941  Subjective/Objective:                 Patient admitted from [facility] after she fell out of her wheelchair.    Action/Plan:  Anticipate DC to [facility] as facilitated through [place] with in 24 hours.  Expected Discharge Date:  11/07/15               Expected Discharge Plan:  [facility]  In-House Referral:  Clinical Social Work  Discharge planning Services  CM Consult  Post Acute Care Choice:    Choice offered to:     DME Arranged:    DME Agency:     HH Arranged:    HH Agency:     Status of Service:  In process, will continue to follow  Medicare Important Message Given:    Date Medicare IM Given:    Medicare IM give by:    Date Additional Medicare IM Given:    Additional Medicare Important Message give by:     If discussed at [place] of Stay Meetings, dates discussed:    Additional Comments:  Carles Collet, RN 11/16/2015, 4:22 PM

## 2015-11-16 NOTE — Progress Notes (Signed)
[facility] PROGRESS NOTE   Stephanie [name] VQQ:[id number] DOB: 22-Mar-1941 DOA: 11/04/2015 PCP: Stephanie Lange, MD  Summary 75 year old aphasic white female from skilled nursing facility who fell out of wheelchair sustained a right hip fracture and was admitted to [facility] on 1/4. She was also noted to have new A. fib with RVR. She was started on a Cardizem drip and briefly became hypotensive. Drip was subsequently stopped. Dr. Aline [name], orthopedics was consulted and felt that patient needed transfer to a higher level of care for anesthesia and cardiology coverage.  Transferred to [facility]. Cardiology consulted and had IM Nail 1/9.  Failed swallow eval 1/10.  NG tube placed  Assessment/Plan: Principal Problem:   Hip fracture, right ([place]) Active Problems:   Adenocarcinoma of left lung, stage 1 (HCC)   Bilateral carotid artery disease (HCC)   Hyperlipidemia   Hypotension   Dysphagia   CKD (chronic kidney disease), stage III   Sacral fracture, closed (HCC)   Closed right hip fracture (HCC)   Atrial fibrillation (HCC)   right superior and inferior pubic rami   Fracture, intertrochanteric, right femur (HCC)   Postoperative anemia due to acute blood loss   Malnutrition of moderate degree   DNR (do not resuscitate)   Palliative care encounter   Right hip Fracture S/p IM nail.   Right sacral fracture/Right superior and inferior pubic rami fx seen on CT PT eval -from SNF  New onset of paroxysmal A. 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 CHADSVASC 5.  eliquis started -scheduled metoprolol -cards following  Acute postoperative blood loss anemia: s/p 2 units prbc  -trend  Severe MR  HOCM  TIA/stroke -Has history of stroke in 2012, recent history of TIA in December 2016. Dysarthric speech since 2/12  Acute renal failure on CKD stage III.  creatinine stable  History of lung cancer stage I History of lung cancer diagnosed January 2013 with surgical  resection, was T2a, pN0. Continue Iressa, per last oncology note question of progression of adenocarcinoma but not proven by biopsy.  HLD -Continue with statin.  HTN   Palliative care consult- appreciate help with  goals   Code Status: DNR Family Communication: spoke with daughter- Mrs Stephanie [name] 1/16 Disposition Plan: Back to skilled nursing facility in AM  Consultants:  Ortho  Cardiology  Palliative care  Procedures:  IM nail  Antibiotics:  None  Subjective: Cries out when bed wet  Objective: Filed Vitals:   11/16/15 0822 11/16/15 1347  BP: 168/96 105/39  Pulse: 88 60  Temp: 98.4 F (36.9 C) 98.5 F (36.9 C)  Resp: 20 20    Intake/Output Summary (Last 24 hours) at 11/16/15 1647 Last data filed at 11/16/15 0949  Gross per 24 hour  Intake    480 ml  Output      0 ml  Net    480 ml   Filed Weights   11/13/15 0330 11/15/15 0730 11/16/15 0541  Weight: 68.402 kg (150 lb 12.8 oz) 70.5 kg (155 lb 6.8 oz) 66 kg (145 lb 8.1 oz)     Exam: General: awake, more eye contact CVS: RRR with 2/6 murmur Chest: clear to auscultation bilaterally, no wheezing, rales or rhonchi Abdomen: soft nontender, nondistended, normal bowel sounds, no organomegaly Extremities: no cyanosis, clubbing or edema noted bilaterally   Data Reviewed: Basic Metabolic Panel:  Recent Labs Lab 11/10/15 0423 11/11/15 0102 11/13/15 0537  NA 146* 144 143  K 4.1 3.4* 3.6  CL 113* 110  110  CO2 21* 22 26  GLUCOSE 98 124* 138*  BUN 17 16 30*  CREATININE 1.20* 1.10* 1.28*  CALCIUM 8.1* 8.5* 8.1*   Liver Function Tests: No results for input(s): AST, ALT, ALKPHOS, BILITOT, PROT, ALBUMIN in the last 168 hours. No results for input(s): LIPASE, AMYLASE in the last 168 hours. No results for input(s): AMMONIA in the last 168 hours. CBC:  Recent Labs Lab 11/10/15 0423 11/11/15 0102 11/13/15 0537  WBC 9.7 11.3* 7.6  HGB 10.3* 10.7* 8.9*  HCT 31.0* 31.8* 26.8*  MCV 95.1 91.9 96.1  PLT  161 226 216   Cardiac Enzymes:  Recent Labs Lab 11/11/15 0102 11/11/15 0554 11/11/15 1300 11/11/15 1956  TROPONINI 0.04* 0.06* 0.04* 0.04*   BNP (last 3 results) No results for input(s): BNP in the last 8760 hours.  ProBNP (last 3 results) No results for input(s): PROBNP in the last 8760 hours.  CBG:  Recent Labs Lab 11/15/15 1746 11/15/15 2014 11/15/15 2359 11/16/15 0531 11/16/15 0811  GLUCAP 107* 119* 110* 106* 102*    Micro No results found for this or any previous visit (from the past 240 hour(s)).   Studies: No results found.  Scheduled Meds: . acetaminophen  500 mg Oral 3 times per day  . amiodarone  400 mg Per Tube BID  . antiseptic oral rinse  7 mL Mouth Rinse BID  . apixaban  5 mg Oral BID  . feeding supplement (PRO-STAT SUGAR FREE 64)  30 mL Per Tube Daily  . metoprolol tartrate  50 mg Per Tube BID   Continuous Infusions: . feeding supplement (JEVITY 1.2 CAL) 1,000 mL (11/13/15 1800)   Time spent: 25 minutes  [place] [facility] [email or web address], password [facility] 11/16/2015, 4:47 PM  LOS: 12 days

## 2015-11-17 LAB — GLUCOSE, CAPILLARY
Glucose-Capillary: 100 mg/dL — ABNORMAL HIGH (ref 65–99)
Glucose-Capillary: 115 mg/dL — ABNORMAL HIGH (ref 65–99)

## 2015-11-17 MED ORDER — RESOURCE THICKENUP CLEAR PO POWD: ORAL | Status: AC

## 2015-11-17 MED ORDER — AMIODARONE HCL 400 MG PO TABS: 400.0000 mg | ORAL_TABLET | Freq: Two times a day (BID) | ORAL | Status: AC

## 2015-11-17 MED ORDER — APIXABAN 5 MG PO TABS: 5.0000 mg | ORAL_TABLET | Freq: Two times a day (BID) | ORAL | Status: DC

## 2015-11-17 MED ORDER — ACETAMINOPHEN 500 MG PO TABS: 500.0000 mg | ORAL_TABLET | Freq: Three times a day (TID) | ORAL | Status: AC

## 2015-11-17 MED ORDER — METOPROLOL TARTRATE 50 MG PO TABS: 50.0000 mg | ORAL_TABLET | Freq: Two times a day (BID) | ORAL | Status: AC

## 2015-11-17 MED ORDER — OXYCODONE HCL 5 MG PO TABS: 5.0000 mg | ORAL_TABLET | Freq: Four times a day (QID) | ORAL | Status: DC | PRN

## 2015-11-17 NOTE — Care Management Important Message (Signed)
Important Message  Patient Details  Name: LAVILLA DELAMORA MRN: [id number] Date of Birth: June 14, 1941   Medicare Important Message Given:  Yes    Carles Collet, RN 11/17/2015, 11:25 AMImportant Message  Patient Details  Name: SHAE HINNENKAMP MRN: [id number] Date of Birth: 1940/12/16   Medicare Important Message Given:  Yes    Carles Collet, RN 11/17/2015, 11:25 AM

## 2015-11-17 NOTE — Progress Notes (Signed)
Report called to SNF

## 2015-11-17 NOTE — Discharge Summary (Signed)
Physician Discharge Summary  Stephanie [name] VZC:[id number] DOB: Dec 01, 1940 DOA: 11/04/2015  PCP: [name], MD  Admit date: 11/04/2015 Discharge date: 11/17/2015  Time spent: 35 minutes  Recommendations for Outpatient Follow-up:  1. Palliative to follow at SNF 2. DYS diet   Discharge Diagnoses:  Principal Problem:   Hip fracture, right ([place]) Active Problems:   Adenocarcinoma of left lung, stage 1 (HCC)   Bilateral carotid artery disease (HCC)   Hyperlipidemia   Hypotension   Dysphagia   CKD (chronic kidney disease), stage III   Sacral fracture, closed (HCC)   Closed right hip fracture (HCC)   Atrial fibrillation (HCC)   right superior and inferior pubic rami   Fracture, intertrochanteric, right femur (HCC)   Postoperative anemia due to acute blood loss   Malnutrition of moderate degree   DNR (do not resuscitate)   Palliative care encounter   Discharge Condition: improved  Diet recommendation: DYS 1 nectar thick  Filed Weights   11/15/15 0730 11/16/15 0541 11/17/15 0443  Weight: 70.5 kg (155 lb 6.8 oz) 66 kg (145 lb 8.1 oz) 66.044 kg (145 lb 9.6 oz)    History of present illness:  Stephanie [name] is an 74 y.o. female whom I took care of previously for TIA, with hx of prior CVA, HLD, HTN, lung cancer, prior pneumothorax, CKD3, SNF resident with DNR code status, presented to the ER after falling and right hip pain. She had a mechanical fall and Xray showed right intertrochanteric Fx. In the ER, she developed new onset of afib with RVR, requiring IV Cardiazem bolus and drip. She was given IV pain meds. Dr Aline Brochure was consulted, and planned to have ORIF Friday. He ordered neurology consultation and bilateral carotid US. She had recently been switched from ASA to Plavix.   Hospital Course:  Right hip Fracture S/p IM nail.   Right sacral fracture/Right superior and inferior pubic rami fx seen on CT PT eval -from SNF  New onset of paroxysmal A. 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 CHADSVASC 5.  eliquis started -scheduled metoprolol -cards following  Acute postoperative blood loss anemia: s/p 2 units prbc  -trend  Severe MR  HOCM  TIA/stroke -Has history of stroke in 2012, recent history of TIA in December 2016. Dysarthric speech since 2/12  Acute renal failure on CKD stage III. creatinine stable  History of lung cancer stage I History of lung cancer diagnosed January 2013 with surgical resection, was T2a, pN0. Continue Iressa, per last oncology note question of progression of adenocarcinoma but not proven by biopsy.  HLD -Continue with statin.  HTN   Procedures:  Repair of hip fx  Consultations:  Palliative  cards  Discharge Exam: Filed Vitals:   11/16/15 2329 11/17/15 0443  BP: 167/63 115/45  Pulse: 93 73  Temp:  97.4 F (36.3 C)  Resp:  16    General: awake Cardiovascular: rrr Respiratory: clear  Discharge Instructions   Discharge Instructions    Discharge instructions    Complete by:  As directed   DYS 1 nectar thick liquids     Increase activity slowly    Complete by:  As directed           Current Discharge Medication List    START taking these medications   Details  amiodarone (PACERONE) 400 MG tablet Take 1 tablet (400 mg total) by mouth 2 (two) times daily.    apixaban (ELIQUIS) 5 MG TABS tablet Take 1 tablet (  5 mg total) by mouth 2 (two) times daily. Qty: 60 tablet    Maltodextrin-Xanthan Gum (RESOURCE THICKENUP CLEAR) POWD As needed    metoprolol (LOPRESSOR) 50 MG tablet Take 1 tablet (50 mg total) by mouth 2 (two) times daily.    oxyCODONE (OXY IR/ROXICODONE) 5 MG immediate release tablet Take 1 tablet (5 mg total) by mouth every 6 (six) hours as needed for moderate pain or severe pain. Qty: 15 tablet, Refills: 0      CONTINUE these medications which have CHANGED   Details  acetaminophen (TYLENOL) 500 MG tablet Take 1 tablet (500 mg total) by mouth every 8  (eight) hours. Qty: 30 tablet, Refills: 0      CONTINUE these medications which have NOT CHANGED   Details  DULoxetine (CYMBALTA) 60 MG capsule Take 60 mg by mouth daily.    pantoprazole (PROTONIX) 20 MG tablet Take 1 tablet (20 mg total) by mouth 2 (two) times daily. Qty: 60 tablet, Refills: 0    senna-docusate (SENOKOT-S) 8.6-50 MG per tablet Take 1 tablet by mouth at bedtime as needed for mild constipation.      STOP taking these medications     ALPRAZolam (XANAX) 0.25 MG tablet      alum & mag hydroxide-simeth (MAALOX/MYLANTA) 200-200-20 MG/5 SUSP      aspirin EC 81 MG tablet      atenolol (TENORMIN) 25 MG tablet      clopidogrel (PLAVIX) 75 MG tablet      diphenoxylate-atropine ([name]) 2.5-0.025 MG tablet      Doxepin HCl 3 MG TABS      gefitinib (IRESSA) 250 MG tablet      guaiFENesin-dextromethorphan (ROBITUSSIN DM) 100-10 MG/5ML syrup      lamoTRIgine (LAMICTAL) 25 MG tablet      metroNIDAZOLE (METROGEL) 0.75 % gel      simvastatin (ZOCOR) 20 MG tablet      traMADol (ULTRAM) 50 MG tablet        No Known Allergies Follow-up Information    Follow up with Swinteck, Horald Pollen, MD. Schedule an appointment as soon as possible for a visit in 2 weeks.   Specialty:  Orthopedic Surgery   Why:  For wound re-check   Contact information:   [address] [address] [phone]        The results of significant diagnostics from this hospitalization (including imaging, microbiology, ancillary and laboratory) are listed below for reference.    Significant Diagnostic Studies: Dg Abd 1 View  11/11/2015  CLINICAL DATA:  Evaluate NG tube placed EXAM: ABDOMEN - 1 VIEW COMPARISON:  08/23/14 FINDINGS: A weighted feeding tube is identified. The tip is in the expected location of the distal stomach or proximal duodenum. The bowel gas pattern is on unremarkable. No dilated loops of small bowel identified. IMPRESSION: 1. Weighted feeding tube  is either in the distal stomach or proximal duodenum. Electronically Signed   By: Kerby Moors M.D.   On: 11/11/2015 11:31   Ct Head Wo Contrast  11/12/2015  CLINICAL DATA:  Initial evaluation for recent acute mental status change. Recent stroke. EXAM: CT HEAD WITHOUT CONTRAST TECHNIQUE: Contiguous axial images were obtained from the base of the skull through the vertex without intravenous contrast. COMPARISON:  Previous MRI and CT from 10/22/2015. FINDINGS: Study is somewhat limited by patient positioning. Diffuse prominence of the CSF containing spaces is compatible with generalized age-related cerebral atrophy. Patchy and confluent hypodensity within the periventricular and deep white matter most  compatible with chronic small vessel ischemic disease. Small vessel type changes within the central pons. Intracranial vascular calcifications noted. Remote lacunar infarcts within the bilateral basal ganglia. These changes are stable relative to prior study. No evidence for acute large vessel territory infarct. No intracranial hemorrhage. No mass lesion, mass effect, or midline shift. Ventricular prominence related to global parenchymal volume loss without hydrocephalus. No extra-axial fluid collection. Scalp soft tissues demonstrate no acute abnormality. Globes and orbits within normal limits. Paranasal sinuses are clear. NG tube in place. No mastoid effusion. Calvarium intact IMPRESSION: 1. No acute intracranial process. Specifically, no evidence for acute or recent ischemia. 2. Stable atrophy with chronic microvascular ischemic disease and remote bilateral lacunar infarcts. Electronically Signed   By: Jeannine Boga M.D.   On: 11/12/2015 20:47   Ct Head Wo Contrast  10/22/2015  CLINICAL DATA:  Code stroke EXAM: CT HEAD WITHOUT CONTRAST TECHNIQUE: Contiguous axial images were obtained from the base of the skull through the vertex without intravenous contrast. COMPARISON:  03/16/2015 FINDINGS: There is  prominence of the sulci and ventricles identified compatible with brain atrophy. Chronic lacunar infarct within the right centrum semiovale is identified and appears similar to previous exam. Mild low attenuation throughout the subcortical and periventricular white matter is identified compatible with chronic microvascular disease. No evidence for acute intracranial hemorrhage, cortical infarct or mass. No abnormal extra-axial fluid collections noted. The paranasal sinuses and the mastoid air cells are clear. The calvarium is intact. IMPRESSION: 1. No acute intracranial abnormalities. 2. Chronic small vessel ischemic disease and brain atrophy. These results were called by telephone at the time of interpretation on 10/22/2015 at 9:32 am to Dr. [name] , who verbally acknowledged these results. Electronically Signed   By: Kerby Moors M.D.   On: 10/22/2015 09:32   Ct Angio Chest Pe W/cm &/or Wo Cm  10/30/2015  CLINICAL DATA:  75 year old female with history of chest pain. History of lung cancer status post lobectomy in 2013. EXAM: CT ANGIOGRAPHY CHEST WITH CONTRAST TECHNIQUE: Multidetector CT imaging of the chest was performed using the standard protocol during bolus administration of intravenous contrast. Multiplanar CT image reconstructions and MIPs were obtained to evaluate the vascular anatomy. CONTRAST:  119m OMNIPAQUE IOHEXOL 350 MG/ML SOLN COMPARISON:  Chest CT 09/02/2015. FINDINGS: Comment: Study is severely limited by a large amount of patient respiratory motion. Because of this, accurate assessment for distal segmental and subsegmental sized emboli is not possible. Mediastinum/Lymph Nodes: Despite the limitations of today's examination, there are no central, lobar or proximal segmental sized filling defects within the pulmonary arteries to suggest pulmonary embolism. Distal segmental and subsegmental sized vessels cannot be accurately assessed secondary to extensive patient respiratory motion.  Heart size is normal. There is no significant pericardial fluid, thickening or pericardial calcification. There is atherosclerosis of the thoracic aorta, the great vessels of the mediastinum and the coronary arteries, including calcified atherosclerotic plaque in the left main, left anterior descending, left circumflex and right coronary arteries. Severe calcifications of the mitral annulus. Shift of cardiomediastinal structures in the left hemithorax related to prior left upper lobectomy and associated volume loss. Multiple borderline enlarged and minimally enlarged mediastinal and hilar lymph nodes are noted, measuring up to 11 mm in short axis in the right paratracheal nodal station. Esophagus is unremarkable in appearance. No axillary lymphadenopathy. Lungs/Pleura: Assessment for small pulmonary nodules is not possible secondary to extensive respiratory motion on today's examination. There are several small pulmonary nodules which are generally unchanged compared to  prior examinations, however, the largest pulmonary nodule in the inferior aspect of the right upper lobe associated with the minor fissure (image 43 of series 6) has enlarged compared to prior studies and is more solid in appearance, currently measuring 1.0 x 1.3 cm (previously 12 x 9 mm on 09/02/2015, but 6 mm on 02/12/2014). There is again a background of patchy areas of ground-glass attenuation, septal thickening and cylindrical/ varicose bronchiectasis throughout these lungs bilaterally, most evident in the lower lobes (particularly in the left lower lobe). No associated honeycombing confidently identified at this time. No acute consolidative airspace disease. No pleural effusions. Upper Abdomen: Unremarkable. Musculoskeletal/Soft Tissues: There are no aggressive appearing lytic or blastic lesions noted in the visualized portions of the skeleton. Multiple old healed right-sided rib fractures incidentally noted. Review of the MIP images confirms  the above findings. IMPRESSION: 1. Limited study demonstrating no central, lobar or proximal segmental sized pulmonary embolism. More distal segmental and subsegmental sized emboli cannot be excluded secondary to extensive patient respiratory motion. 2. Multiple small pulmonary nodules again noted, generally stable compared to prior studies with exception of mild enlargement of a 10 x 13 mm right upper lobe nodule, as discussed above. 3. Findings in the lungs again suggestive of underlying interstitial lung disease. 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 4. Atherosclerosis, including left main and 3 vessel coronary artery disease. Assessment for potential risk factor modification, dietary therapy or pharmacologic therapy may be warranted, if clinically indicated. 5. Status post left upper lobectomy. Electronically Signed   By: Vinnie Langton M.D.   On: 10/30/2015 18:32   Mr Brain Wo Contrast  10/22/2015  CLINICAL DATA:  Acute onset of left-sided weakness.  Stroke. EXAM: MRI HEAD WITHOUT CONTRAST TECHNIQUE: Multiplanar, multiecho pulse sequences of the brain and surrounding structures were obtained without intravenous contrast. COMPARISON:  Head CT same day.  MRI 09/19/2011. FINDINGS: The study does suffer from motion degradation. Diffusion imaging does not show any acute or subacute infarction. There is generalized brain atrophy. There chronic small-vessel changes affecting the pons. No cerebellar insult. The cerebral hemispheres show moderate changes of chronic small vessel disease within the deep and subcortical white matter. No large vessel territory infarction. No mass lesion, hemorrhage, hydrocephalus or extra-axial collection. No pituitary mass. No inflammatory sinus disease. No skull or skullbase lesion. Electronically Signed   By: Nelson Chimes M.D.   On: 10/22/2015 17:43   Ct Hip Right Wo  Contrast  11/06/2015  CLINICAL DATA:  [place] 2 days ago.  Deformity of the right leg. EXAM: CT OF THE RIGHT HIP WITHOUT CONTRAST TECHNIQUE: Multidetector CT imaging of the right hip was performed according to the standard protocol. Multiplanar CT image reconstructions were also generated. COMPARISON:  Radiographs 11/03/2005 FINDINGS: There is a severely displaced complex right hip fracture. This is an intertrochanteric fracture with extension into the base of the cervical neck. The greater and lesser trochanter fractures are displaced and comminuted. The femoral head is normally located. The acetabulum is intact. There are superior and inferior pubic rami fractures on the right side also. Significant surrounding hematoma. No intrapelvic hematoma. The pubic symphysis and right SI joint are intact. Right-sided sacral fractures are also noted. IMPRESSION: 1. Severely displaced and comminuted complex right hip fracture with both intertrochanteric and basicervical neck components. 2. Superior and inferior pubic rami fractures on the right side and right-sided sacral fractures. Electronically Signed   By: Ricky Stabs.D.  On: 11/06/2015 17:02   US Carotid Bilateral  11/05/2015  CLINICAL DATA:  Recent right femoral fracture and history of TIAs EXAM: BILATERAL CAROTID DUPLEX ULTRASOUND TECHNIQUE: Pearline Cables scale imaging, color Doppler and duplex ultrasound were performed of bilateral carotid and vertebral arteries in the neck. COMPARISON:  02/06/2013 FINDINGS: Criteria: Quantification of carotid stenosis is based on velocity parameters that correlate the residual internal carotid diameter with NASCET-based stenosis levels, using the diameter of the distal internal carotid lumen as the denominator for stenosis measurement. The following velocity measurements were obtained: RIGHT ICA:  118/20 cm/sec CCA:  831/51 cm/sec SYSTOLIC ICA/CCA RATIO:  0.6 DIASTOLIC ICA/CCA RATIO:  0.8 ECA:  102 cm/sec LEFT ICA:  141/25 cm/sec CCA:   761/60 cm/sec SYSTOLIC ICA/CCA RATIO:  1.3 DIASTOLIC ICA/CCA RATIO:  1.5 ECA:  119 cm/sec RIGHT CAROTID ARTERY: Grayscale images demonstrate intimal thickening within the common carotid artery as well as plaque formation in the carotid bulb and extending into the proximal internal carotid artery. The waveforms, velocities and flow velocity ratios show no evidence of focal hemodynamically significant stenosis. RIGHT VERTEBRAL ARTERY:  Antegrade in nature. LEFT CAROTID ARTERY: Intimal thickening is noted within the common carotid artery on the left. Plaque formation in the carotid bulb and extending into the proximal internal carotid artery is noted. The waveforms, velocities and flow velocity ratios again demonstrate a stenosis in the 50-69% range likely in the lower end of that spectrum. LEFT VERTEBRAL ARTERY:  Antegrade in nature. 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 No significant stenosis on the right is noted. Electronically Signed   By: Inez Catalina M.D.   On: 11/05/2015 11:50   Dg Chest Portable 1 View  11/04/2015  CLINICAL DATA:  Status post fall from wheelchair. Right shoulder pain. Personal history of lung cancer status post left upper lobectomy. Initial encounter. EXAM: PORTABLE CHEST 1 VIEW COMPARISON:  Chest radiograph and CTA of the chest performed 10/30/2015 FINDINGS: The appearance of the lungs is stable from the prior study, reflecting diffuse interstitial lung disease with fibrotic change, worse at the left lung base. Known small pulmonary nodules are not well characterized on radiograph. No definite pleural effusion or pneumothorax is seen. The cardiomediastinal silhouette is borderline normal in size. No acute osseous abnormalities are seen. IMPRESSION: 1. Appearance of the lungs is stable from the prior study, reflecting diffuse interstitial lung disease with fibrotic change, worse at the left lung base. Known small pulmonary  nodules are not well characterized on radiograph. 2. No displaced rib fracture seen. Electronically Signed   By: Garald Balding M.D.   On: 11/04/2015 23:50   Dg Chest Port 1 View  10/30/2015  CLINICAL DATA:  75 year old female with chest pain for 2 days. EXAM: PORTABLE CHEST 1 VIEW COMPARISON:  05/16/2015 and prior exams. FINDINGS: The cardiomediastinal silhouette is unchanged. Diffuse chronic interstitial opacities are again identified, possibly slightly increased from the prior study. There is no evidence pneumothorax or pleural effusion. Left lung volume loss again noted. No acute bony abnormalities identified. IMPRESSION: Interstitial opacities which may be slightly increased the prior study. This may represent acute infection/edema superimposed on chronic fibrosis. Electronically Signed   By: Margarette Canada M.D.   On: 10/30/2015 15:30   Dg Shoulder Right Port  11/04/2015  CLINICAL DATA:  Fall from wheelchair.  RIGHT shoulder pain EXAM: PORTABLE RIGHT SHOULDER - 2+ VIEW COMPARISON:  None. FINDINGS: Glenohumeral joint is intact. No evidence of scapular fracture or  humeral fracture. The acromioclavicular joint is intact. IMPRESSION: No fracture or dislocation. Electronically Signed   By: Suzy Bouchard M.D.   On: 11/04/2015 23:49   Dg Knee Complete 4 Views Right  11/04/2015  CLINICAL DATA:  Fall from wheelchair at nursing home prior to arrival, now with right lower extremity pain. Right hip pain radiating to the knee. Initial encounter. EXAM: RIGHT KNEE - COMPLETE 4+ VIEW COMPARISON:  None. FINDINGS: Note fracture or dislocation. 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 Detailed evaluation partially obscured related to positioning. No large joint effusion. Vascular calcifications are seen. IMPRESSION: 1. No acute fracture or dislocation. 2. 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 Electronically Signed   By: Jeb Levering M.D.    On: 11/04/2015 19:19   Dg Abd Portable 1v  11/12/2015  CLINICAL DATA:  NG tube placement. EXAM: PORTABLE ABDOMEN - 1 VIEW COMPARISON:  Abdominal radiograph obtained yesterday. FINDINGS: Tip of the weighted enteric tube is unchanged in position in the right upper quadrant, in the region of the distal stomach or proximal duodenum. Nonobstructive bowel gas pattern. Moderate stool in the right colon. IMPRESSION: Tip of the weighted enteric tube unchanged in position, either in the distal stomach or proximal duodenum. Electronically Signed   By: Jeb Levering M.D.   On: 11/12/2015 21:46   Dg Hip Operative Unilat With Pelvis Right  11/08/2015  CLINICAL DATA:  IM nail of right hip EXAM: OPERATIVE right HIP (WITH PELVIS IF PERFORMED) 2 VIEWS TECHNIQUE: Fluoroscopic spot image(s) were submitted for interpretation post-operatively. COMPARISON:  11/04/2015 FINDINGS: Hip screw an IM nail reduces the intertrochanteric fracture involving the proximal right femur. The fracture fragments and hardware components are in anatomic alignment. No complications identified. IMPRESSION: 1. Status post ORIF of proximal femur fracture. Electronically Signed   By: Kerby Moors M.D.   On: 11/08/2015 11:04   Dg Hip Unilat With Pelvis 2-3 Views Right  11/08/2015  CLINICAL DATA:  Postop right hip pinning.  Confusion. EXAM: DG HIP (WITH OR WITHOUT PELVIS) 2-3V RIGHT COMPARISON:  Intraoperative fluoroscopic spot images from earlier same day. Comparison also made to earlier plain film of the right hip dated 11/04/2015. FINDINGS: Intra medullary rod with associated dynamic hip screw traverses the right femoral neck fracture site. Hardware appears intact and well aligned. Osseous alignment is anatomic. No surgical complicating features seen. IMPRESSION: IMPRESSION Status post fixation of the right femoral neck fracture. Hardware appears intact and well aligned. No surgical complicating feature. Electronically Signed   By: Franki Cabot  M.D.   On: 11/08/2015 12:02   Dg Hip Unilat With Pelvis 2-3 Views Right  11/04/2015  CLINICAL DATA:  Pt fell from wheelchair at nursing home prior to arrival to Radiology. Severe pain in Rt hip with radiation into Rt knee. EXAM: DG HIP (WITH OR WITHOUT PELVIS) 2-3V RIGHT COMPARISON:  None. FINDINGS: Intertrochanteric fracture of the proximal RIGHT femur with varus angulation. There is superior migration of the distal fracture fragment. No dislocation. IMPRESSION: RIGHT intertrochanteric femur fracture. Electronically Signed   By: Suzy Bouchard M.D.   On: 11/04/2015 19:20    Microbiology: No results found for this or any previous visit (from the past 240 hour(s)).   Labs: Basic Metabolic Panel:  Recent Labs Lab 11/11/15 0102 11/13/15 0537  NA 144 143  K 3.4* 3.6  CL 110 110  CO2 22 26  GLUCOSE 124* 138*  BUN 16 30*  CREATININE 1.10* 1.28*  CALCIUM 8.5* 8.1*   Liver  Function Tests: No results for input(s): AST, ALT, ALKPHOS, BILITOT, PROT, ALBUMIN in the last 168 hours. No results for input(s): LIPASE, AMYLASE in the last 168 hours. No results for input(s): AMMONIA in the last 168 hours. CBC:  Recent Labs Lab 11/11/15 0102 11/13/15 0537  WBC 11.3* 7.6  HGB 10.7* 8.9*  HCT 31.8* 26.8*  MCV 91.9 96.1  PLT 226 216   Cardiac Enzymes:  Recent Labs Lab 11/11/15 0102 11/11/15 0554 11/11/15 1300 11/11/15 1956  TROPONINI 0.04* 0.06* 0.04* 0.04*   BNP: BNP (last 3 results) No results for input(s): BNP in the last 8760 hours.  ProBNP (last 3 results) No results for input(s): PROBNP in the last 8760 hours.  CBG:  Recent Labs Lab 11/16/15 0811 11/16/15 1725 11/16/15 2014 11/17/15 0019 11/17/15 0441  GLUCAP 102* 130* 101* 115* 100*       Signed:  JESSICA U VANN DO.  [facility] 11/17/2015, 1:09 PM

## 2015-11-17 NOTE — NC FL2 (Signed)
[place] MEDICAID FL2 LEVEL OF CARE SCREENING TOOL     IDENTIFICATION  Patient Name: Stephanie [name] Birthdate: 1941-06-11 Sex: female Admission Date (Current Location): 11/04/2015  [facility] and [place] Number:  Producer, television/film/video and Address:  [facility] [place], [address], [place], [place] [address]      Provider Number: [id number]  Attending Physician Name and Address:  Joseph Art, DO  Relative Name and Phone Number:  Selena Batten daughter, [phone]    Current Level of Care: Hospital Recommended Level of Care: Skilled Nursing Facility Prior Approval Number:    Date Approved/Denied:   PASRR Number:    Discharge Plan: SNF    Current Diagnoses: Patient Active Problem List   Diagnosis Date Noted  . Malnutrition of moderate degree 11/12/2015  . DNR (do not resuscitate) 11/12/2015  . Palliative care encounter 11/12/2015  . Postoperative anemia due to acute blood loss 11/09/2015  . Fracture, intertrochanteric, right femur (HCC) 11/08/2015  . right superior and inferior pubic rami 11/07/2015  . Closed right hip fracture (HCC)   . Atrial fibrillation (HCC)   . Hip fracture, right (HCC) 11/04/2015  . CKD (chronic kidney disease), stage III 11/04/2015  . Sacral fracture, closed (HCC) 11/04/2015  . Chest pain 10/31/2015  . TIA (transient ischemic attack) 10/22/2015  . Dysphagia 10/22/2015  . Rash and nonspecific skin eruption 09/22/2015  . Cellulitis 07/31/2015  . Low back pain 07/22/2015  . Back pain 04/14/2015  . UTI (urinary tract infection) 04/14/2015  . History of CVA (cerebrovascular accident) 04/14/2015  . Mental status change 03/16/2015  . Hypotension 03/16/2015  . Acute encephalopathy 03/16/2015  . Encephalopathy acute 03/16/2015  . Altered mental status   . Arterial hypotension   . Cough 11/10/2014  . Anemia 11/10/2014  . Dyspnea 10/16/2014  . Acute on chronic renal failure (HCC) 10/16/2014  . Bronchitis 10/15/2014  . Diastolic CHF,  acute (HCC) 10/15/2014  . Hyperlipidemia 05/20/2014  . Lack of coordination 02/19/2013  . Muscle weakness (generalized) 02/19/2013  . Dysarthria as late effect of stroke 02/04/2013  . Bilateral carotid artery disease (HCC) 02/04/2013  . Other and unspecified hyperlipidemia 02/04/2013  . Adenocarcinoma of left lung, stage 1 (HCC) 10/19/2011  . Pneumothorax of left lung after biopsy 10/14/2011    Class: Acute  . CVA (cerebral infarction) 09/17/2011  . HTN (hypertension) 09/17/2011    Orientation RESPIRATION BLADDER Height & Weight    Self  Normal Incontinent   145 lbs.  BEHAVIORAL SYMPTOMS/MOOD NEUROLOGICAL BOWEL NUTRITION STATUS  Other (Comment) (Cries out frequently)   Incontinent  (Please see DC summary)  AMBULATORY STATUS COMMUNICATION OF NEEDS Skin   Extensive Assist Non-Verbally (Has trouble speaking) Surgical wounds (Closed hip incision)                       Personal Care Assistance Level of Assistance  Bathing, Feeding, Dressing Bathing Assistance: Maximum assistance Feeding assistance: Limited assistance Dressing Assistance: Maximum assistance     Functional Limitations Info  Speech     Speech Info: Impaired    SPECIAL CARE FACTORS FREQUENCY                       Contractures      Additional Factors Info  Code Status, Allergies Code Status Info: DNR Allergies Info: NKA           Current Medications (11/17/2015):  This is the current hospital active medication list  Current Facility-Administered Medications  Medication Dose Route Frequency Provider Last Rate Last Dose  . acetaminophen (TYLENOL) tablet 650 mg  650 mg Oral Q6H PRN Samson Frederic, MD   650 mg at 11/12/15 4132   Or  . acetaminophen (TYLENOL) suppository 650 mg  650 mg Rectal Q6H PRN Samson Frederic, MD   650 mg at 11/15/15 0324  . acetaminophen (TYLENOL) tablet 500 mg  500 mg Oral 3 times per day Ilda Basset, RPH   500 mg at 11/17/15 0534  . amiodarone (PACERONE) tablet  400 mg  400 mg Per Tube BID Rinaldo Cloud, MD   400 mg at 11/16/15 2329  . antiseptic oral rinse (CPC / CETYLPYRIDINIUM CHLORIDE 0.05%) solution 7 mL  7 mL Mouth Rinse BID Clydia Llano, MD   7 mL at 11/16/15 2341  . apixaban (ELIQUIS) tablet 5 mg  5 mg Oral BID Bertram Millard, RPH   5 mg at 11/16/15 2330  . feeding supplement (JEVITY 1.2 CAL) liquid 1,000 mL  1,000 mL Per Tube Continuous Idell Pickles, RD 55 mL/hr at 11/13/15 1800 1,000 mL at 11/13/15 1800  . feeding supplement (PRO-STAT SUGAR FREE 64) liquid 30 mL  30 mL Per Tube Daily Idell Pickles, RD   30 mL at 11/16/15 1039  . menthol-cetylpyridinium (CEPACOL) lozenge 3 mg  1 lozenge Oral PRN Samson Frederic, MD       Or  . phenol (CHLORASEPTIC) mouth spray 1 spray  1 spray Mouth/Throat PRN Samson Frederic, MD      . metoprolol (LOPRESSOR) tablet 50 mg  50 mg Per Tube BID Rinaldo Cloud, MD   50 mg at 11/16/15 2330  . ondansetron (ZOFRAN) tablet 4 mg  4 mg Oral Q6H PRN Samson Frederic, MD       Or  . ondansetron [facility]) injection 4 mg  4 mg Intravenous Q6H PRN Samson Frederic, MD   4 mg at 11/10/15 1031  . oxyCODONE (Oxy IR/ROXICODONE) immediate release tablet 5-10 mg  5-10 mg Oral Q4H PRN Joseph Art, DO   5 mg at 11/17/15 0107  . RESOURCE THICKENUP CLEAR   Oral PRN Joseph Art, DO      . senna-docusate (Senokot-S) tablet 1 tablet  1 tablet Oral QHS PRN Houston Siren, MD         Discharge Medications: Please see discharge summary for a list of discharge medications.  Relevant Imaging Results:  Relevant Lab Results:   Additional Information    Mearl Latin, LCSWA

## 2015-11-17 NOTE — Progress Notes (Signed)
Patient will DC to: [place] date: 11/17/15 Family notified: [name], daughter Transport by: PTAR  CSW signing off.  Cedric Fishman, [place] Social Worker [phone]

## 2015-11-17 NOTE — Progress Notes (Signed)
Physical Therapy Treatment Patient Details Name: ADLEIGH MCMASTERS MRN: [id number] DOB: Aug 17, 1941 Today's Date: 11/17/2015    History of Present Illness Pt was admitted with R hip fx after falling from w/c at her SNF. S/p IM nail with post op anemia and afib with RVR complicating hospital course. 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    PT Comments    Pt was seen for evaluation of her mobility with family (daughter) in attendance.  Daughter has concerns that pt be challenged to move again, and is interested in whatever rehab can offer her.  Was in agreement with getting pt up in chair and talked with CNA while daughter in about how to transition pt back to bed.  Follow Up Recommendations  SNF     Equipment Recommendations  None recommended by PT (allow SNF to decide)    Recommendations for Other Services       Precautions / Restrictions Precautions Precautions: Fall Restrictions Weight Bearing Restrictions: Yes RLE Weight Bearing: Partial weight bearing RLE Partial Weight Bearing Percentage or Pounds: 30    Mobility  Bed Mobility Overal bed mobility: Needs Assistance Bed Mobility: Supine to Sit Rolling: Mod assist   Supine to sit: Mod assist     General bed mobility comments: Pt is unable to reach for bedrails or to get legs moving to side of bed  Transfers Overall transfer level: Needs assistance Equipment used: 1 person hand held assist (second person to stand by for safety) Transfers: Squat Pivot Transfers     Squat pivot transfers: Mod assist;Max assist;+2 physical assistance;+2 safety/equipment;From elevated surface        Ambulation/Gait             General Gait Details: unable   Stairs            Wheelchair Mobility    Modified Rankin (Stroke Patients Only)       Balance     Sitting balance-Leahy Scale: Poor                              Cognition Arousal/Alertness:  Awake/alert Behavior During Therapy: Flat affect Overall Cognitive Status: History of cognitive impairments - at baseline       Memory: Decreased recall of precautions;Decreased short-term memory              Exercises General Exercises - Lower Extremity Ankle Circles/Pumps: PROM;Both;5 reps Long Arc Quad: PROM;Both;10 reps    General Comments General comments (skin integrity, edema, etc.): Pt worked with PT on sitting control and finally was more evenly able to sit on B hips.  Loses her focus and lets herself lean backward but can control it with min assist      Pertinent Vitals/Pain Pain Assessment: Faces Faces Pain Scale: Hurts even more Pain Location: R hip Pain Descriptors / Indicators: Other (Comment) (moaning but per daughter is common for pt) Pain Intervention(s): Monitored during session;Repositioned    Home Living Family/patient expects to be discharged to:: Skilled nursing facility                    Prior Function            PT Goals (current goals can now be found in the care plan section) Acute Rehab PT Goals Patient Stated Goal: daughter would like pt to return to her PLOF Progress towards PT goals: Progressing toward  goals    Frequency  Min 2X/week    PT Plan Current plan remains appropriate    Co-evaluation             End of Session   Activity Tolerance: Patient limited by fatigue;Patient limited by pain;Treatment limited secondary to medical complications (Comment) (30% WB on RLE) Patient left: in chair;with call bell/phone within reach;with nursing/sitter in room;with family/visitor present     Time: 8280-0349 PT Time Calculation (min) (ACUTE ONLY): 29 min  Charges:  $Therapeutic Activity: 23-37 mins                    G Codes:      Ramond Dial 2015/11/20, 1:44 PM   Mee Hives, PT MS Acute Rehab Dept. Number: [facility] [phone] and [place] [phone]

## 2015-11-18 ENCOUNTER — Non-Acute Institutional Stay (SKILLED_NURSING_FACILITY): Payer: Medicare Other | Admitting: Internal Medicine

## 2015-11-18 ENCOUNTER — Other Ambulatory Visit: Payer: Self-pay | Admitting: *Deleted

## 2015-11-18 DIAGNOSIS — I69359 Hemiplegia and hemiparesis following cerebral infarction affecting unspecified side: Secondary | ICD-10-CM

## 2015-11-18 DIAGNOSIS — F482 Pseudobulbar affect: Secondary | ICD-10-CM | POA: Diagnosis not present

## 2015-11-18 DIAGNOSIS — I480 Paroxysmal atrial fibrillation: Secondary | ICD-10-CM | POA: Diagnosis not present

## 2015-11-18 DIAGNOSIS — I340 Nonrheumatic mitral (valve) insufficiency: Secondary | ICD-10-CM

## 2015-11-18 DIAGNOSIS — S72141A Displaced intertrochanteric fracture of right femur, initial encounter for closed fracture: Secondary | ICD-10-CM | POA: Diagnosis not present

## 2015-11-18 MED ORDER — OXYCODONE HCL 5 MG PO TABS: 5.0000 mg | ORAL_TABLET | Freq: Four times a day (QID) | ORAL | Status: DC | PRN

## 2015-11-24 ENCOUNTER — Non-Acute Institutional Stay (SKILLED_NURSING_FACILITY): Payer: Medicare Other | Admitting: Internal Medicine

## 2015-11-24 DIAGNOSIS — F411 Generalized anxiety disorder: Secondary | ICD-10-CM | POA: Diagnosis not present

## 2015-11-24 DIAGNOSIS — R400 Somnolence: Secondary | ICD-10-CM

## 2015-11-24 DIAGNOSIS — S72001D Fracture of unspecified part of neck of right femur, subsequent encounter for closed fracture with routine healing: Secondary | ICD-10-CM | POA: Diagnosis not present

## 2015-11-24 NOTE — Progress Notes (Signed)
Patient ID: Stephanie [name], female   DOB: Jun 26, 1941, 75 y.o.   MRN: [id number]                   Facility; [facility] This is an acute visit  Chief complaint acute visit secondary to pain management-agitation-sedation question anxiety  HPI---; this is a patient who is admitted herea year ago. she had beenadmitted to the hospital  with shortness of breath cough, a CT scan of her chests suggested nodules in the right lung, chronic fibrotic change questionable mild CHF.Her major disability is dysarthriaand right arm greater than leg weakness felt to be secondary to a dominant hemisphere stroke some 2 years before she came here.Normally the patient can communicate around most thingsst recently she was sent out with left-sided weakness. MRI showed no acute stroke and she appeared to come back to her normal status- Most recent issue was a right hip fracture sustained after an apparent fall the facility-she did receive an ORIF.  Patient continues to have a very challenging behavioral type presentation-she does have crying episodes-however she also has periods of what appears to be sedation.  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  Amiodarone was started in response to apparently atrial fibrillation she developed during her most recent hospitalization.  Regards to anxiety she is on Klonopin 5 mg daily at bedtime-she has gotten an couple extra doses when necessary overnight but apparently there are concerns that these have sedated her somewhat.  In regards to pain she has orders for Norco every 6 hours as well as oxycodone 5 mg every 6 hours when necessary-talking with nursing apparently she is only receiving the [place] speaking with her nurse who is well familiar with her she feels the tramadol has been as effective if not more effective in the  past.  Currently patient is somewhat somnolent but arousable she does attempt to speak but does not speak much.           Past Medical History  Diagnosis Date  . CVA (cerebral infarction)     speech difficulities  . Stroke ([name])   . Hypercholesteremia     Takes Zocor daily  . Hypertension     takes Atenolol and Lisinopril daily  . Lung mass     left upper lobe  . Arthritis     hands  . TIA (transient ischemic attack)     Sat before thanksgiving;impaired speech  . Pneumothorax   . Lung cancer ([place])   . CKD (chronic kidney disease), stage III    Past Surgical History  Procedure Laterality Date  . No past surgeries    . Lung biopsy  10/13/11    left  . Abdominal hysterectomy  1975  . Varicose vein surgery  85yr ago  . Video assisted thoracoscopy  11/18/2011    Procedure: VIDEO ASSISTED THORACOSCOPY;  Surgeon: SMelrose Nakayama MD;  Location: [facility]  Service: Thoracic;  Laterality: Left;  . Lobectomy  11/18/2011    Procedure: LOBECTOMY;  Surgeon: SMelrose Nakayama MD;  [facility]  Service: Thoracic;  Laterality: Left;  LEFT UPPER LOBECTOMY  . Breast biopsy  early 2000     Medications.  Tylenol 500 mg every 8 hours.  Amiodarone 4 mg twice a day.  Cymbalta 60 mg daily.  Glucose 5 mg twice a day.  Norco-03/02/2024 milligrams one tablet  every 6 hours when necessary.  Lopressor 50 mg twice a day.  Oxycodone 5 mg every 6 hours when necessary.  Senokot when necessary.  Klonopin 0.5 mg daily at bedtime.  Prilosec 20 mg daily.  Zocor 20 mg daily.                                      30 tablet 5  .    30 tablet 2  .      Marland Kitchen   30 tablet 6  .      .      .      .   30 tablet 5  .   15 tablet 0     Social is noted the patient is a chronic patient here. Her speech is normally dysarthric but she can often make her self understood  She is a full code  Family History  Problem Relation Age of Onset  . Stroke Mother   . Stroke  Sister   . Anesthesia problems Neg Hx   . Hypotension Neg Hx   . Malignant hyperthermia Neg Hx   . Pseudochol deficiency Neg Hx   . Diabetes Brother   . Cancer Brother   . Stroke Brother     Review of systems; not really possible     Physical examination  T 98.2 pulse 59 respirations 20 blood pressure 140/92 In general this is a very frail elderly female who is somnolent but  arousable.   Her skin is dry and warm HEENT; probable cataractsbilaterally Respiratory poor respiratory effort but I could not appreciate any overt congestion there is no labored breathing Cardiac; grade 3/6 pan systolic ejection murmur on the right sternal border. 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 Right arm weaknessgreater than right leg. This is  not new. I. Her speech is dysarthric, halting. She is not speaking as much as I have seen previously.     Labs.  Nov 13 2015.  WBC 7.6 hemoglobin 8.9 platelets 216.  Sodium 143 potassium 3.6 BUN 30 creatinine 1.28 CO2 level XXVI  10/02/2015.  WBC 4.7 hemoglobin 12.5 platelets 159.  Sodium 137 potassium 4.2 BUN 17 creatinine 1.23.  Albumin 3.2 otherwise liver function tests within normal limits.   Impression/plan  #1-history of right hip fracture-pain management issues-per discussion with nursing will discontinue the Norco and oxycodone-and start tramadol 50 mg every 6 hours when necessary-she also continues on Tylenol routinely at this point will monitor  -this is complicated with a history of anxiety-as well as agitation-challenging situation concerns for sedation will discontinue the when necessary Klonopin and continue the routine Klonopin at night-she also has a  psych consult pending. At this point will monitor-again today she appears to have sedation which would make one hesitant to be t aggressive with her anti-anxiety medicines although again this will have to be watched  Also will update a CBC and CMP tomorrow  [id number]         BMP Latest Ref Rng 10/22/2015 10/22/2015 09/16/2015  Glucose 65 - 99 mg/dL 122(H) 127(H) 116(H)  BUN 6 - 20 mg/dL 16 17 21(H)  Creatinine 0.44 - 1.00 mg/dL 1.20(H)  1.23(H) 1.21(H)  Sodium 135 - 145 mmol/L 140 137 139  Potassium 3.5 - 5.1 mmol/L 4.1 4.2 3.9  Chloride 101 - 111 mmol/L 104 105 106  CO2 22 - 32 mmol/L - 27 26  Calcium 8.9 - 10.3 mg/dL - 8.8(L) 9.3    CBC Latest Ref Rng 10/22/2015 10/22/2015 09/16/2015  WBC 4.0 - 10.5 K/uL - 4.7 5.8  Hemoglobin 12.0 - 15.0 g/dL 12.6 12.5 14.2  Hematocrit 36.0 - 46.0 % 37.0 36.9 42.2  Platelets 150 - 400 K/uL - 159 235

## 2015-11-24 NOTE — Progress Notes (Addendum)
Patient ID: Stephanie [name], female   DOB: Aug 16, 1941, 75 y.o.   MRN: [id number]                HISTORY & PHYSICAL  DATE:  11/18/2015            FACILITY: [facility]                        LEVEL OF CARE:   SNF   CHIEF COMPLAINT:  Readmission to facility, post stay at [facility], 11/04/2015 through 11/17/2015.    HISTORY OF PRESENT ILLNESS:  This is a patient who came to [place] in late 4696, having diastolic heart failure.     She is a patient with a complex medical history, including a history of a dominant hemisphere stroke sometime before her arrival here.    She also is felt to have an adenocarcinoma of the left lung stage I, followed by Oncology but not biopsy-proven.    I had really expected her to go home shortly after her arrival here.  However, she became a long-term patient.    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    On this occasion, she apparently fell out of her wheelchair while trying to pick something up off the floor.  She suffered a right hip fracture.  She underwent ORIF.    In the hospital, she developed new onset of AFib.  This ultimately was controlled with amiodarone and metoprolol.  Because of this, I think, she was discharged on Eliquis.    PAST MEDICAL HISTORY/PROBLEM LIST:   Past Medical History  Diagnosis Date  . CVA (cerebral infarction)     speech difficulities  . Stroke ([place])   . Hypercholesteremia     Takes Zocor daily  . Hypertension     takes Atenolol and Lisinopril daily  . Lung mass     left upper lobe  . Arthritis     hands  . TIA (transient ischemic attack)     Sat before thanksgiving;impaired speech  . Pneumothorax   . Lung cancer ([place])   . CKD (chronic kidney disease), stage III     PAST SURGICAL HISTORY:   Past Surgical History  Procedure Laterality Date  . No past surgeries    . Lung biopsy  10/13/11    left    . Abdominal hysterectomy  1975  . Varicose vein surgery  36yr ago  . Video assisted thoracoscopy  11/18/2011    Procedure: VIDEO ASSISTED THORACOSCOPY;  Surgeon: SMelrose Nakayama MD;  [place] [facility]  Service: Thoracic;  Laterality: Left;  . Lobectomy  11/18/2011    Procedure: LOBECTOMY;  Surgeon: SMelrose Nakayama MD;  [place] [facility]  Service: Thoracic;  Laterality: Left;  LEFT UPPER LOBECTOMY  . Breast biopsy  early 2000  . Intramedullary (im) nail intertrochanteric Right 11/08/2015    Procedure: INTRAMEDULLARY (IM) NAIL INTERTROCHANTERIC;  Surgeon: BRod Can MD;  [facility]  Service: Orthopedics;  Laterality: Right;      CURRENT MEDICATIONS:  Medication list is reviewed.  Discharge medications include:    Amiodarone 400 b.i.d.      Eliquis 5 mg b.i.d.     Metoprolol 50 b.i.d.      Oxycodone 5 mg q.6 hours p.r.n.     Tylenol 500 mg every 8 hours.     Cymbalta 60  q.d.      Protonix 20 q.d.      Senokot S 8.6/50, 1 tablet at bedtime.    *A number of medications were stopped, including:    Xanax.    Enteric-coated aspirin.    Plavix.    Atenolol.     Doxepin.    Lamictal.     Simvastatin.    SOCIAL HISTORY:                   HOUSING:  As mentioned, the patient has been a long-term patient here.   ADVANCED DIRECTIVES:  No advanced directives.    FAMILY HISTORY:   Reviewed.      REVIEW OF SYSTEMS:  Really  not possible from the patient.  However, staff report:   GENERAL:  She is crying excessively, her roommate says all night long.    PHYSICAL EXAMINATION:   VITAL SIGNS:     PULSE:  78.   RESPIRATIONS:  18 and unlabored.   02 SATURATIONS:  95% on room air.    GENERAL APPEARANCE:   The patient looks somewhat delirious, restless.  Alternates between yelling, crying, and then during my exam fell asleep.   CHEST/RESPIRATORY:  Coarse bilateral crackles.  She has no clubbing.  Her work of breathing is normal.   CARDIOVASCULAR:   CARDIAC:   Heart sounds are normal.  There is a 2/6 pansystolic murmur at the lower left sternal border.  This does not radiate.     GASTROINTESTINAL:   ABDOMEN:  No masses.    LIVER/SPLEEN/KIDNEY:   No liver, no spleen.   GENITOURINARY:   BLADDER:  Not distended.  There is no CVA tenderness.     NEUROLOGICAL:   SENSATION/STRENGTH:  She has right-sided weakness versus the left.  I think all of this is chronic.   DEEP TENDON REFLEXES:  She does, however, have bilateral Hoffman's reflexes.  Both toes are downgoing.  She is bilaterally pathologically brisk both in the arms and legs.   PSYCHIATRIC:   MENTAL STATUS:  Her speech is difficult to understand.   I think there is some degree of motor and sensory aphasia.  I also do not think that this is new.  She can, however, answer some questions quite clearly.   For instance, relative in the building, she was able to identify.   Her affect is generally depressed.  There is compulsive crying, which I think is probably pseudobulbar affect.  The patient is otherwise restless, anxious.    ASSESSMENT/PLAN:            Right hip fracture.  Her incision is well healed.    Complex mental status.  Some combination of pseudobulbar affect,dyspasia depression, and anxiety.  It is not easy to sort all of this out.    Late-effect dominant hemisphere CVA.  She had a recent MRI on 10/22/2015.  Chronic small vessel disease involving the pons, moderate changes of small vessel disease within the deep and subcortical white matter bilaterally.  No large vessel territory infarction, which is surprising.  The report does not really compare this to a previous MRI, however.    New atrial fibrillation.  On Eliquis.  Her heart rate is controlled.    Mitral regurgitation, which is listed as severe.  She also has a moderately dilated left atrium.  I find it a bit surprising that her pulse is regular today.  Her ejection fraction is normal.    The most problematic area here seems to  be her  mental status.  I think she is going to need to go back on long-acting benzodiazepine, perhaps Klonopin.  At some point, I think this woman is going to require Neudexta.    Interestingly, her MRI did not suggest a large cortical stroke.  However, she has severe bilateral small vessel disease.

## 2015-11-25 ENCOUNTER — Encounter (HOSPITAL_COMMUNITY)
Admission: AD | Admit: 2015-11-25 | Discharge: 2015-11-25 | Disposition: A | Discharge status: Home / Self Care | Payer: Medicare Other | Source: Skilled Nursing Facility | Attending: Internal Medicine | Admitting: Internal Medicine

## 2015-11-25 LAB — COMPREHENSIVE METABOLIC PANEL
ALT: 17 U/L (ref 14–54)
AST: 22 U/L (ref 15–41)
Albumin: 2.6 g/dL — ABNORMAL LOW (ref 3.5–5.0)
Alkaline Phosphatase: 191 U/L — ABNORMAL HIGH (ref 38–126)
Anion gap: 5 (ref 5–15)
BUN: 30 mg/dL — AB (ref 6–20)
CHLORIDE: 104 mmol/L (ref 101–111)
CO2: 27 mmol/L (ref 22–32)
CREATININE: 1.58 mg/dL — AB (ref 0.44–1.00)
Calcium: 8.7 mg/dL — ABNORMAL LOW (ref 8.9–10.3)
GFR calc Af Amer: 36 mL/min — ABNORMAL LOW (ref >60)
GFR calc non Af Amer: 31 mL/min — ABNORMAL LOW (ref >60)
Glucose, Bld: 98 mg/dL (ref 65–99)
Potassium: 4.8 mmol/L (ref 3.5–5.1)
SODIUM: 136 mmol/L (ref 135–145)
Total Bilirubin: 0.7 mg/dL (ref 0.3–1.2)
Total Protein: 6.6 g/dL (ref 6.5–8.1)

## 2015-11-25 LAB — CBC
HCT: 30.5 % — ABNORMAL LOW (ref 36.0–46.0)
Hemoglobin: 9.7 g/dL — ABNORMAL LOW (ref 12.0–15.0)
MCH: 31.8 pg (ref 26.0–34.0)
MCHC: 31.8 g/dL (ref 30.0–36.0)
MCV: 100 fL (ref 78.0–100.0)
PLATELETS: 332 10*3/uL (ref 150–400)
RBC: 3.05 MIL/uL — AB (ref 3.87–5.11)
RDW: 15.5 % (ref 11.5–15.5)
WBC: 6.1 10*3/uL (ref 4.0–10.5)

## 2015-11-27 ENCOUNTER — Encounter (HOSPITAL_COMMUNITY)
Admission: AD | Admit: 2015-11-27 | Discharge: 2015-11-27 | Disposition: A | Discharge status: Home / Self Care | Payer: Medicare Other | Source: Skilled Nursing Facility | Attending: Internal Medicine | Admitting: Internal Medicine

## 2015-11-27 LAB — BASIC METABOLIC PANEL
Anion gap: 7 (ref 5–15)
BUN: 14 mg/dL (ref 6–20)
CO2: 27 mmol/L (ref 22–32)
CREATININE: 0.95 mg/dL (ref 0.44–1.00)
Calcium: 8.8 mg/dL — ABNORMAL LOW (ref 8.9–10.3)
Chloride: 103 mmol/L (ref 101–111)
GFR, EST NON AFRICAN AMERICAN: 58 mL/min — AB (ref >60)
Glucose, Bld: 174 mg/dL — ABNORMAL HIGH (ref 65–99)
POTASSIUM: 4 mmol/L (ref 3.5–5.1)
SODIUM: 137 mmol/L (ref 135–145)

## 2015-11-28 ENCOUNTER — Non-Acute Institutional Stay (SKILLED_NURSING_FACILITY): Payer: Medicare Other | Admitting: Internal Medicine

## 2015-11-28 DIAGNOSIS — I69359 Hemiplegia and hemiparesis following cerebral infarction affecting unspecified side: Secondary | ICD-10-CM | POA: Diagnosis not present

## 2015-11-28 DIAGNOSIS — F482 Pseudobulbar affect: Secondary | ICD-10-CM

## 2015-11-29 ENCOUNTER — Emergency Department (HOSPITAL_COMMUNITY): Payer: Medicare Other

## 2015-11-29 ENCOUNTER — Encounter (HOSPITAL_COMMUNITY): Payer: Self-pay | Admitting: Emergency Medicine

## 2015-11-29 ENCOUNTER — Inpatient Hospital Stay (HOSPITAL_COMMUNITY): Payer: Medicare Other

## 2015-11-29 ENCOUNTER — Inpatient Hospital Stay (HOSPITAL_COMMUNITY)
Admission: EM | Admit: 2015-11-29 | Discharge: 2015-12-03 | DRG: 377 | Disposition: A | Discharge status: Hospice | Payer: Medicare Other | Attending: Internal Medicine | Admitting: Internal Medicine

## 2015-11-29 DIAGNOSIS — Z90710 Acquired absence of both cervix and uterus: Secondary | ICD-10-CM | POA: Diagnosis not present

## 2015-11-29 DIAGNOSIS — K5289 Other specified noninfective gastroenteritis and colitis: Secondary | ICD-10-CM | POA: Diagnosis present

## 2015-11-29 DIAGNOSIS — F419 Anxiety disorder, unspecified: Secondary | ICD-10-CM | POA: Diagnosis present

## 2015-11-29 DIAGNOSIS — N183 Chronic kidney disease, stage 3 (moderate): Secondary | ICD-10-CM | POA: Diagnosis present

## 2015-11-29 DIAGNOSIS — K625 Hemorrhage of anus and rectum: Secondary | ICD-10-CM | POA: Diagnosis present

## 2015-11-29 DIAGNOSIS — Z833 Family history of diabetes mellitus: Secondary | ICD-10-CM

## 2015-11-29 DIAGNOSIS — I129 Hypertensive chronic kidney disease with stage 1 through stage 4 chronic kidney disease, or unspecified chronic kidney disease: Secondary | ICD-10-CM | POA: Diagnosis present

## 2015-11-29 DIAGNOSIS — N179 Acute kidney failure, unspecified: Secondary | ICD-10-CM | POA: Diagnosis present

## 2015-11-29 DIAGNOSIS — E785 Hyperlipidemia, unspecified: Secondary | ICD-10-CM | POA: Diagnosis present

## 2015-11-29 DIAGNOSIS — J449 Chronic obstructive pulmonary disease, unspecified: Secondary | ICD-10-CM | POA: Diagnosis present

## 2015-11-29 DIAGNOSIS — E7800 Pure hypercholesterolemia, unspecified: Secondary | ICD-10-CM | POA: Diagnosis present

## 2015-11-29 DIAGNOSIS — Z96641 Presence of right artificial hip joint: Secondary | ICD-10-CM | POA: Diagnosis present

## 2015-11-29 DIAGNOSIS — N189 Chronic kidney disease, unspecified: Secondary | ICD-10-CM | POA: Diagnosis not present

## 2015-11-29 DIAGNOSIS — I69320 Aphasia following cerebral infarction: Secondary | ICD-10-CM | POA: Diagnosis not present

## 2015-11-29 DIAGNOSIS — Z7189 Other specified counseling: Secondary | ICD-10-CM | POA: Insufficient documentation

## 2015-11-29 DIAGNOSIS — D649 Anemia, unspecified: Secondary | ICD-10-CM | POA: Diagnosis present

## 2015-11-29 DIAGNOSIS — K529 Noninfective gastroenteritis and colitis, unspecified: Secondary | ICD-10-CM | POA: Diagnosis present

## 2015-11-29 DIAGNOSIS — I959 Hypotension, unspecified: Secondary | ICD-10-CM | POA: Diagnosis present

## 2015-11-29 DIAGNOSIS — E43 Unspecified severe protein-calorie malnutrition: Secondary | ICD-10-CM | POA: Diagnosis present

## 2015-11-29 DIAGNOSIS — Z85118 Personal history of other malignant neoplasm of bronchus and lung: Secondary | ICD-10-CM | POA: Diagnosis not present

## 2015-11-29 DIAGNOSIS — Z6823 Body mass index (BMI) 23.0-23.9, adult: Secondary | ICD-10-CM | POA: Diagnosis not present

## 2015-11-29 DIAGNOSIS — Z809 Family history of malignant neoplasm, unspecified: Secondary | ICD-10-CM | POA: Diagnosis not present

## 2015-11-29 DIAGNOSIS — Z515 Encounter for palliative care: Secondary | ICD-10-CM | POA: Diagnosis present

## 2015-11-29 DIAGNOSIS — R627 Adult failure to thrive: Secondary | ICD-10-CM | POA: Diagnosis not present

## 2015-11-29 DIAGNOSIS — Z7901 Long term (current) use of anticoagulants: Secondary | ICD-10-CM | POA: Diagnosis not present

## 2015-11-29 DIAGNOSIS — Z902 Acquired absence of lung [part of]: Secondary | ICD-10-CM

## 2015-11-29 DIAGNOSIS — Z66 Do not resuscitate: Secondary | ICD-10-CM | POA: Diagnosis present

## 2015-11-29 DIAGNOSIS — K922 Gastrointestinal hemorrhage, unspecified: Secondary | ICD-10-CM | POA: Diagnosis present

## 2015-11-29 DIAGNOSIS — I480 Paroxysmal atrial fibrillation: Secondary | ICD-10-CM | POA: Diagnosis present

## 2015-11-29 DIAGNOSIS — Z823 Family history of stroke: Secondary | ICD-10-CM

## 2015-11-29 DIAGNOSIS — R0602 Shortness of breath: Secondary | ICD-10-CM

## 2015-11-29 HISTORY — DX: Unspecified fall, initial encounter: W19.XXXA

## 2015-11-29 HISTORY — DX: Dysphagia, unspecified: R13.10

## 2015-11-29 HISTORY — DX: Abnormal posture: R29.3

## 2015-11-29 HISTORY — DX: Muscle weakness (generalized): M62.81

## 2015-11-29 HISTORY — DX: Repeated falls: R29.6

## 2015-11-29 HISTORY — DX: Fracture of unspecified part of neck of unspecified femur, initial encounter for closed fracture: S72.009A

## 2015-11-29 HISTORY — DX: Heart failure, unspecified: I50.9

## 2015-11-29 HISTORY — DX: Chronic obstructive pulmonary disease, unspecified: J44.9

## 2015-11-29 LAB — CBC WITH DIFFERENTIAL/PLATELET
Basophils Absolute: 0 10*3/uL (ref 0.0–0.1)
Basophils Relative: 0 %
EOS ABS: 0.4 10*3/uL (ref 0.0–0.7)
EOS PCT: 5 %
HCT: 29.1 % — ABNORMAL LOW (ref 36.0–46.0)
Hemoglobin: 9.3 g/dL — ABNORMAL LOW (ref 12.0–15.0)
LYMPHS ABS: 1.1 10*3/uL (ref 0.7–4.0)
LYMPHS PCT: 12 %
MCH: 32.1 pg (ref 26.0–34.0)
MCHC: 32 g/dL (ref 30.0–36.0)
MCV: 100.3 fL — AB (ref 78.0–100.0)
MONO ABS: 1.1 10*3/uL — AB (ref 0.1–1.0)
MONOS PCT: 12 %
Neutro Abs: 6.5 10*3/uL (ref 1.7–7.7)
Neutrophils Relative %: 71 %
PLATELETS: 249 10*3/uL (ref 150–400)
RBC: 2.9 MIL/uL — AB (ref 3.87–5.11)
RDW: 15.6 % — ABNORMAL HIGH (ref 11.5–15.5)
WBC: 9.1 10*3/uL (ref 4.0–10.5)

## 2015-11-29 LAB — HEPATIC FUNCTION PANEL
ALK PHOS: 184 U/L — AB (ref 38–126)
ALT: 17 U/L (ref 14–54)
AST: 25 U/L (ref 15–41)
Albumin: 2.6 g/dL — ABNORMAL LOW (ref 3.5–5.0)
BILIRUBIN INDIRECT: 0.9 mg/dL (ref 0.3–0.9)
BILIRUBIN TOTAL: 1.1 mg/dL (ref 0.3–1.2)
Bilirubin, Direct: 0.2 mg/dL (ref 0.1–0.5)
TOTAL PROTEIN: 6.4 g/dL — AB (ref 6.5–8.1)

## 2015-11-29 LAB — TYPE AND SCREEN
ABO/RH(D): O POS
Antibody Screen: NEGATIVE

## 2015-11-29 LAB — HEMOGLOBIN AND HEMATOCRIT, BLOOD
HCT: 33.7 % — ABNORMAL LOW (ref 36.0–46.0)
Hemoglobin: 10.4 g/dL — ABNORMAL LOW (ref 12.0–15.0)

## 2015-11-29 LAB — SEDIMENTATION RATE: Sed Rate: 95 mm/hr — ABNORMAL HIGH (ref 0–22)

## 2015-11-29 MED ORDER — ACETAMINOPHEN 500 MG PO TABS: 500.0000 mg | ORAL_TABLET | Freq: Four times a day (QID) | ORAL | Status: DC | PRN

## 2015-11-29 MED ORDER — ALPRAZOLAM 0.25 MG PO TABS
0.2500 mg | ORAL_TABLET | Freq: Three times a day (TID) | ORAL | Status: DC | PRN
  Administered 2015-11-29 – 2015-12-02 (×4): 0.25 mg via ORAL
  Filled 2015-11-29 (×5): qty 1

## 2015-11-29 MED ORDER — TRAZODONE HCL 50 MG PO TABS
25.0000 mg | ORAL_TABLET | Freq: Every evening | ORAL | Status: DC | PRN
  Filled 2015-11-29: qty 1

## 2015-11-29 MED ORDER — CLONAZEPAM 0.5 MG PO TABS
0.5000 mg | ORAL_TABLET | Freq: Two times a day (BID) | ORAL | Status: DC
  Administered 2015-11-29 – 2015-11-30 (×2): 0.5 mg via ORAL
  Filled 2015-11-29 (×2): qty 1

## 2015-11-29 MED ORDER — AMIODARONE HCL 200 MG PO TABS
400.0000 mg | ORAL_TABLET | Freq: Two times a day (BID) | ORAL | Status: DC
  Administered 2015-11-29 – 2015-12-03 (×5): 400 mg via ORAL
  Filled 2015-11-29 (×5): qty 2

## 2015-11-29 MED ORDER — SODIUM CHLORIDE 0.9% FLUSH
3.0000 mL | Freq: Two times a day (BID) | INTRAVENOUS | Status: DC
  Administered 2015-11-29 – 2015-12-01 (×4): 3 mL via INTRAVENOUS

## 2015-11-29 MED ORDER — ONDANSETRON HCL 4 MG PO TABS: 4.0000 mg | ORAL_TABLET | Freq: Four times a day (QID) | ORAL | Status: DC | PRN

## 2015-11-29 MED ORDER — IOHEXOL 300 MG/ML  SOLN
100.0000 mL | Freq: Once | INTRAMUSCULAR | Status: AC | PRN
  Administered 2015-11-29: 100 mL via INTRAVENOUS

## 2015-11-29 MED ORDER — SODIUM CHLORIDE 0.9 % IV BOLUS (SEPSIS)
1000.0000 mL | Freq: Once | INTRAVENOUS | Status: AC
Start: 2015-11-29 — End: 2015-11-29
  Administered 2015-11-29: 1000 mL via INTRAVENOUS

## 2015-11-29 MED ORDER — ONDANSETRON HCL 4 MG/2ML IJ SOLN: 4.0000 mg | Freq: Four times a day (QID) | INTRAMUSCULAR | Status: DC | PRN

## 2015-11-29 MED ORDER — SIMVASTATIN 20 MG PO TABS
20.0000 mg | ORAL_TABLET | Freq: Every day | ORAL | Status: DC
  Administered 2015-11-30 – 2015-12-02 (×2): 20 mg via ORAL
  Filled 2015-11-29 (×2): qty 1

## 2015-11-29 MED ORDER — METRONIDAZOLE IN NACL 5-0.79 MG/ML-% IV SOLN
500.0000 mg | Freq: Once | INTRAVENOUS | Status: AC
  Administered 2015-11-29: 500 mg via INTRAVENOUS

## 2015-11-29 MED ORDER — CIPROFLOXACIN IN D5W 400 MG/200ML IV SOLN
400.0000 mg | Freq: Once | INTRAVENOUS | Status: AC
  Administered 2015-11-29: 400 mg via INTRAVENOUS
  Filled 2015-11-29: qty 200

## 2015-11-29 MED ORDER — TRAMADOL HCL 50 MG PO TABS: 50.0000 mg | ORAL_TABLET | Freq: Four times a day (QID) | ORAL | Status: DC | PRN

## 2015-11-29 MED ORDER — MORPHINE SULFATE (PF) 2 MG/ML IV SOLN
1.0000 mg | INTRAVENOUS | Status: DC | PRN
  Administered 2015-11-30 – 2015-12-02 (×14): 1 mg via INTRAVENOUS
  Filled 2015-11-29 (×14): qty 1

## 2015-11-29 MED ORDER — PANTOPRAZOLE SODIUM 40 MG IV SOLR
40.0000 mg | Freq: Two times a day (BID) | INTRAVENOUS | Status: DC
  Administered 2015-11-29 – 2015-11-30 (×2): 40 mg via INTRAVENOUS
  Filled 2015-11-29 (×2): qty 40

## 2015-11-29 MED ORDER — METRONIDAZOLE IN NACL 5-0.79 MG/ML-% IV SOLN
500.0000 mg | Freq: Once | INTRAVENOUS | Status: DC
  Administered 2015-11-29: 500 mg via INTRAVENOUS
  Filled 2015-11-29: qty 100

## 2015-11-29 MED ORDER — DULOXETINE HCL 60 MG PO CPEP
60.0000 mg | ORAL_CAPSULE | Freq: Every day | ORAL | Status: DC
  Administered 2015-11-30 – 2015-12-03 (×2): 60 mg via ORAL
  Filled 2015-11-29 (×3): qty 1

## 2015-11-29 NOTE — H&P (Signed)
[facility] History and Physical  SHANTY GINTY SJG:[id number] DOB: 1941/06/11 DOA: 11/29/2015  Referring physician: ED PCP: [name], MD   Chief Complaint: Rectal bleeding   HPI:  Stephanie [name] is a 75 year old female with a past medical history of CVA, hypertension, hyperlipidemia, CKD stage 3, atrial fibrillation on Eliquis, lung cancer, s/p right hip replacement on 1/8; who presents from skilled nursing facility with complaints of rectal bleeding. Patient's history is obtained from the family, and they state no previous history of bleeding previously. Patient complains of right hip pain. However they've also noticed that her blood pressure has been intermittently low in recent weeks. Family states patient is a DO NOT RESUSCITATE. They deny any NSAID use and patient is on Eliquis for history of atrial fibrillation. Patient did not try anything that made symptoms better or worse.  On admission patient was evaluated and seen to have signs of a colitis of the descending colon on CT of the abdomen and pelvis. There was questionable right kidney involvement. Urinalysis had not been collected at that time however.      Review of Systems  Constitutional: Negative for fever, chills, malaise/fatigue and diaphoresis.  HENT: Negative for hearing loss and tinnitus.   Eyes: Negative for double vision and photophobia.  Respiratory: Positive for shortness of breath. Negative for hemoptysis.   Cardiovascular: Negative for chest pain and palpitations.  Gastrointestinal: Positive for blood in stool. Negative for vomiting and abdominal pain.  Genitourinary: Negative for frequency and hematuria.  Musculoskeletal: Positive for joint pain. Negative for falls.  Skin: Negative for itching and rash.  Neurological: Negative for seizures.  Endo/Heme/Allergies: Negative for environmental allergies. Bruises/bleeds easily.  Psychiatric/Behavioral: Positive for memory loss.       Past Medical History   Diagnosis Date  . CVA (cerebral infarction)     speech difficulities  . Stroke ([place])   . Hypercholesteremia     Takes Zocor daily  . Hypertension     takes Atenolol and Lisinopril daily  . Lung mass     left upper lobe  . Arthritis     hands  . TIA (transient ischemic attack)     Sat before thanksgiving;impaired speech  . Pneumothorax   . Lung cancer ([place])   . CKD (chronic kidney disease), stage III   . Fracture, hip ([place])   . CHF (congestive heart failure) ([place])   . Muscle weakness (generalized)   . Dysphagia   . COPD (chronic obstructive pulmonary disease) ([place])   . Falls   . Abnormal posture      Past Surgical History  Procedure Laterality Date  . No past surgeries    . Lung biopsy  10/13/11    left  . Abdominal hysterectomy  1975  . Varicose vein surgery  38yr ago  . Video assisted thoracoscopy  11/18/2011    Procedure: VIDEO ASSISTED THORACOSCOPY;  Surgeon: SMelrose Nakayama MD;  [facility]  Service: Thoracic;  Laterality: Left;  . Lobectomy  11/18/2011    Procedure: LOBECTOMY;  Surgeon: SMelrose Nakayama MD;  Location: [facility]  Service: Thoracic;  Laterality: Left;  LEFT UPPER LOBECTOMY  . Breast biopsy  early 2000  . Intramedullary (im) nail intertrochanteric Right 11/08/2015    Procedure: INTRAMEDULLARY (IM) NAIL INTERTROCHANTERIC;  Surgeon: BRod Can MD;  [facility]  Service: Orthopedics;  Laterality: Right;      Social History:  reports that she has never smoked. She has never used smokeless tobacco. She reports that  she does not drink alcohol or use illicit drugs. Where does patient live-- SNF  Can patient participate in ADLs? Needs assistance  No Known Allergies  Family History  Problem Relation Age of Onset  . Stroke Mother   . Stroke Sister   . Anesthesia problems Neg Hx   . Hypotension Neg Hx   . Malignant hyperthermia Neg Hx   . Pseudochol deficiency Neg Hx   . Diabetes Brother   . Cancer Brother   . Stroke Brother          Prior to Admission medications   Medication Sig Start Date End Date Taking? Authorizing Provider  acetaminophen (TYLENOL) 500 MG tablet Take 1 tablet (500 mg total) by mouth every 8 (eight) hours. 11/17/15  Yes Geradine Girt, DO  ALPRAZolam (XANAX) 0.25 MG tablet Take 0.25 mg by mouth 3 (three) times daily as needed for anxiety.   Yes Historical Provider, MD  amiodarone (PACERONE) 400 MG tablet Take 1 tablet (400 mg total) by mouth 2 (two) times daily. 11/17/15  Yes Geradine Girt, DO  apixaban (ELIQUIS) 5 MG TABS tablet Take 1 tablet (5 mg total) by mouth 2 (two) times daily. 11/17/15  Yes Geradine Girt, DO  clonazePAM (KLONOPIN) 0.5 MG tablet Take 0.5 mg by mouth 2 (two) times daily.   Yes Historical Provider, MD  DULoxetine (CYMBALTA) 60 MG capsule Take 60 mg by mouth daily.   Yes Historical Provider, MD  metoprolol (LOPRESSOR) 50 MG tablet Take 1 tablet (50 mg total) by mouth 2 (two) times daily. 11/17/15  Yes Geradine Girt, DO  omeprazole (PRILOSEC) 20 MG capsule Take 20 mg by mouth daily.   Yes Historical Provider, MD  simvastatin (ZOCOR) 20 MG tablet Take 20 mg by mouth daily.   Yes Historical Provider, MD  traMADol (ULTRAM) 50 MG tablet Take 50 mg by mouth every 6 (six) hours as needed for moderate pain.    Yes Historical Provider, MD  traZODone (DESYREL) 25 mg TABS tablet Take 25 mg by mouth at bedtime as needed for sleep.   Yes Historical Provider, MD  Maltodextrin-Xanthan Gum (RESOURCE THICKENUP CLEAR) POWD As needed 11/17/15   Geradine Girt, DO  pantoprazole (PROTONIX) 20 MG tablet Take 1 tablet (20 mg total) by mouth 2 (two) times daily. 10/30/15   Merrily Pew, MD  senna-docusate (SENOKOT-S) 8.6-50 MG per tablet Take 1 tablet by mouth at bedtime as needed for mild constipation. 03/17/15   Radene Gunning, NP     Physical Exam: Filed Vitals:   11/29/15 1945 11/29/15 2000 11/29/15 2016 11/29/15 2022  BP:  88/51 96/52   Pulse: 47 48 49 50  Temp:      TempSrc:      Resp:  18 13  11   SpO2: 100% 100% 100% 100%     Constitutional: Vital signs reviewed. Patient is chronically ill-appearing, but appears to be in no acute distress Head: Normocephalic and atraumatic  Ear: TM normal bilaterally  Mouth: no erythema or exudates, MMM  Eyes: PERRL, EOMI, conjunctivae normal, No scleral icterus.  Neck: Supple, Trachea midline normal ROM, No JVD, mass, thyromegaly, or carotid bruit present.  Cardiovascular: Bradycardia, S1 normal, S2 normal, no MRG, pulses symmetric and intact bilaterally  Pulmonary/Chest: CTAB, no wheezes, rales, or rhonchi  Abdominal: Soft. Non-tender, non-distended, bowel sounds are normal, no masses, organomegaly, or guarding present.  GU: no CVA tenderness dark bloody stools present on patient's bed sheets when rolled to the side  Musculoskeletal:  Status post right hip replacement with pain with movement of the affected extremity  Ext: no edema and no cyanosis, pulses palpable bilaterally (DP and PT)  Hematology: no cervical, inginal, or axillary adenopathy.  Neurological: A&O x3, Strenght is normal and symmetric bilaterally, cranial nerve II-XII are grossly intact, no focal motor deficit, sensory intact to light touch bilaterally.  Skin: Warm, dry and intact. No rash, cyanosis, or clubbing.  Surgical scars healing well from right hip surgery.  Psychiatric: Normal mood and affect. speech and behavior is normal. Judgment and thought content normal. Cognition and memory are normal.      Data Review   Micro Results No results found for this or any previous visit (from the past 240 hour(s)).  Radiology Reports Dg Abd 1 View  11/11/2015  CLINICAL DATA:  Evaluate NG tube placed EXAM: ABDOMEN - 1 VIEW COMPARISON:  08/23/14 FINDINGS: A weighted feeding tube is identified. The tip is in the expected location of the distal stomach or proximal duodenum. The bowel gas pattern is on unremarkable. No dilated loops of small bowel identified. IMPRESSION: 1.  Weighted feeding tube is either in the distal stomach or proximal duodenum. Electronically Signed   By: Signa Kell M.D.   On: 11/11/2015 11:31   Ct Head Wo Contrast  11/12/2015  CLINICAL DATA:  Initial evaluation for recent acute mental status change. Recent stroke. EXAM: CT HEAD WITHOUT CONTRAST TECHNIQUE: Contiguous axial images were obtained from the base of the skull through the vertex without intravenous contrast. COMPARISON:  Previous MRI and CT from 10/22/2015. FINDINGS: Study is somewhat limited by patient positioning. Diffuse prominence of the CSF containing spaces is compatible with generalized age-related cerebral atrophy. Patchy and confluent hypodensity within the periventricular and deep white matter most compatible with chronic small vessel ischemic disease. Small vessel type changes within the central pons. Intracranial vascular calcifications noted. Remote lacunar infarcts within the bilateral basal ganglia. These changes are stable relative to prior study. No evidence for acute large vessel territory infarct. No intracranial hemorrhage. No mass lesion, mass effect, or midline shift. Ventricular prominence related to global parenchymal volume loss without hydrocephalus. No extra-axial fluid collection. Scalp soft tissues demonstrate no acute abnormality. Globes and orbits within normal limits. Paranasal sinuses are clear. NG tube in place. No mastoid effusion. Calvarium intact IMPRESSION: 1. No acute intracranial process. Specifically, no evidence for acute or recent ischemia. 2. Stable atrophy with chronic microvascular ischemic disease and remote bilateral lacunar infarcts. Electronically Signed   By: Rise Mu M.D.   On: 11/12/2015 20:47   Ct Abdomen Pelvis W Contrast  11/29/2015  CLINICAL DATA:  Dark red blood in adult diaper today. On 11/06/2015, with IM nail placed on 11/08/2015. History of lung cancer and hypertension. EXAM: CT ABDOMEN AND PELVIS WITH CONTRAST TECHNIQUE:  Multidetector CT imaging of the abdomen and pelvis was performed using the standard protocol following bolus administration of intravenous contrast. CONTRAST:  OMNIPAQUE IOHEXOL 300 MG/ML  SOLN COMPARISON:  Multiple exams, including 04/11/2015 FINDINGS: Lower chest: Interstitial and airspace opacities in both lower lobes into a lesser degree in the right middle lobe. Coronary atherosclerosis and mild cardiomegaly. Mitral annular calcification. Hepatobiliary: Mildly prominent caudate lobe and lateral segment, possibly an early morphologic finding of cirrhosis. Dependent density in the gallbladder may reflect sludge or small gallstones. Mildly distended gallbladder. No biliary dilatation. No significant abnormal focal hepatic lesion. Pancreas: Unremarkable Spleen: Unremarkable Adrenals/Urinary Tract: Mild bilateral renal atrophy. Bilateral peripelvic cysts. Several small hypodense lesions of the  right kidney are statistically likely to represent cysts although it is technically nonspecific due to small size. In addition, there is a 1.6 cm right mid kidney region of hypoenhancement on urographic phase images. There may be some overlying scarring in this vicinity on portal venous phase images. No findings of leak from the urinary bladder. Stomach/Bowel: Sigmoid colon diverticulosis. Wall thickening in the descending colon appears abnormal. Mild pericolic stranding in the descending colon. Vascular/Lymphatic: Aortoiliac atherosclerotic vascular disease. 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 This has not changed appreciably from 04/11/2015. The celiac trunk, SMA, and IMA appear patent. Reproductive: Uterus absent. Left ovary unremarkable, probable small right ovarian remnant likewise unremarkable. Other: If No supplemental non-categorized findings. Musculoskeletal: Right sacral alar fracture again noted. Fractures of the right superior pubic ramus, right pubic body, and right  inferior pubic ramus show early healing response. Comminuted left proximal femoral fracture involving the femoral neck and intertrochanteric region observe for thigh and nail in place, no obvious complicating feature. There still some hematoma and edema in the vicinity of the fractures. Anterior wedging at L1 is likely chronic. There is mild left foraminal stenosis at L4-5 as well as moderate left and prominent right foraminal stenosis at L5-S1 due to disc uncovering, disc bulge, facet arthropathy, and right foraminal disc protrusion at L5-S1. IMPRESSION: 1. There is abnormal wall thickening in the descending colon with surrounding stranding. I am suspicious for infectious colitis. This could be a cause for rectal bleeding. 2. On urographic phase images there is indistinct hypodensity in the right mid kidney. This is technically nonspecific but could reflect a small region of pyelonephritis. Correlate with urine analysis. 3. Healing fractures of the right pelvis. Right hip IM nail in place traversing comminuted fracture without complicating feature observed. 4. Bibasilar interstitial and airspace opacities, possibly from edema or less likely pneumonia. 5. Other imaging findings of potential clinical significance: Coronary atherosclerosis and mild cardiomegaly. Mitral annular calcification. Suspected early morphologic signs of hepatic cirrhosis. Possible small layering gallstones in the gallbladder. The gallbladder is mildly distended but not thick-walled. Mild bilateral renal atrophy. Bilateral peripelvic cysts. Small right renal cysts. Sigmoid colon diverticulosis. Aortoiliac atherosclerotic vascular disease. Foraminal impingement at L4-5 and L5-S1. Electronically Signed   By: Van Clines M.D.   On: 11/29/2015 19:26   Ct Hip Right Wo Contrast  11/06/2015  CLINICAL DATA:  [place] 2 days ago.  Deformity of the right leg. EXAM: CT OF THE RIGHT HIP WITHOUT CONTRAST TECHNIQUE: Multidetector CT imaging of the right  hip was performed according to the standard protocol. Multiplanar CT image reconstructions were also generated. COMPARISON:  Radiographs 11/03/2005 FINDINGS: There is a severely displaced complex right hip fracture. This is an intertrochanteric fracture with extension into the base of the cervical neck. The greater and lesser trochanter fractures are displaced and comminuted. The femoral head is normally located. The acetabulum is intact. There are superior and inferior pubic rami fractures on the right side also. Significant surrounding hematoma. No intrapelvic hematoma. The pubic symphysis and right SI joint are intact. Right-sided sacral fractures are also noted. IMPRESSION: 1. Severely displaced and comminuted complex right hip fracture with both intertrochanteric and basicervical neck components. 2. Superior and inferior pubic rami fractures on the right side and right-sided sacral fractures. Electronically Signed   By: Marijo Sanes M.D.   On: 11/06/2015 17:02   US Carotid Bilateral  11/05/2015  CLINICAL DATA:  Recent right femoral fracture and history of TIAs EXAM: BILATERAL  CAROTID DUPLEX ULTRASOUND TECHNIQUE: Pearline Cables scale imaging, color Doppler and duplex ultrasound were performed of bilateral carotid and vertebral arteries in the neck. COMPARISON:  02/06/2013 FINDINGS: Criteria: Quantification of carotid stenosis is based on velocity parameters that correlate the residual internal carotid diameter with NASCET-based stenosis levels, using the diameter of the distal internal carotid lumen as the denominator for stenosis measurement. The following velocity measurements were obtained: RIGHT ICA:  118/20 cm/sec CCA:  174/94 cm/sec SYSTOLIC ICA/CCA RATIO:  0.6 DIASTOLIC ICA/CCA RATIO:  0.8 ECA:  102 cm/sec LEFT ICA:  141/25 cm/sec CCA:  496/75 cm/sec SYSTOLIC ICA/CCA RATIO:  1.3 DIASTOLIC ICA/CCA RATIO:  1.5 ECA:  119 cm/sec RIGHT CAROTID ARTERY: Grayscale images demonstrate intimal thickening within the common  carotid artery as well as plaque formation in the carotid bulb and extending into the proximal internal carotid artery. The waveforms, velocities and flow velocity ratios show no evidence of focal hemodynamically significant stenosis. RIGHT VERTEBRAL ARTERY:  Antegrade in nature. LEFT CAROTID ARTERY: Intimal thickening is noted within the common carotid artery on the left. Plaque formation in the carotid bulb and extending into the proximal internal carotid artery is noted. The waveforms, velocities and flow velocity ratios again demonstrate a stenosis in the 50-69% range likely in the lower end of that spectrum. LEFT VERTEBRAL ARTERY:  Antegrade in nature. 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 No significant stenosis on the right is noted. Electronically Signed   By: Inez Catalina M.D.   On: 11/05/2015 11:50   Dg Chest Portable 1 View  11/04/2015  CLINICAL DATA:  Status post fall from wheelchair. Right shoulder pain. Personal history of lung cancer status post left upper lobectomy. Initial encounter. EXAM: PORTABLE CHEST 1 VIEW COMPARISON:  Chest radiograph and CTA of the chest performed 10/30/2015 FINDINGS: The appearance of the lungs is stable from the prior study, reflecting diffuse interstitial lung disease with fibrotic change, worse at the left lung base. Known small pulmonary nodules are not well characterized on radiograph. No definite pleural effusion or pneumothorax is seen. The cardiomediastinal silhouette is borderline normal in size. No acute osseous abnormalities are seen. IMPRESSION: 1. Appearance of the lungs is stable from the prior study, reflecting diffuse interstitial lung disease with fibrotic change, worse at the left lung base. Known small pulmonary nodules are not well characterized on radiograph. 2. No displaced rib fracture seen. Electronically Signed   By: Garald Balding M.D.   On: 11/04/2015 23:50   Dg Shoulder Right  Port  11/04/2015  CLINICAL DATA:  Fall from wheelchair.  RIGHT shoulder pain EXAM: PORTABLE RIGHT SHOULDER - 2+ VIEW COMPARISON:  None. FINDINGS: Glenohumeral joint is intact. No evidence of scapular fracture or humeral fracture. The acromioclavicular joint is intact. IMPRESSION: No fracture or dislocation. Electronically Signed   By: Suzy Bouchard M.D.   On: 11/04/2015 23:49   Dg Knee Complete 4 Views Right  11/04/2015  CLINICAL DATA:  Fall from wheelchair at nursing home prior to arrival, now with right lower extremity pain. Right hip pain radiating to the knee. Initial encounter. EXAM: RIGHT KNEE - COMPLETE 4+ VIEW COMPARISON:  None. FINDINGS: Note fracture or dislocation. 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 Detailed evaluation partially obscured related to positioning. No large joint effusion. Vascular calcifications are seen. IMPRESSION: 1. No acute fracture or dislocation. 2. 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 Electronically Signed   By: Fonnie Birkenhead.D.  On: 11/04/2015 19:19   Dg Abd Portable 1v  11/12/2015  CLINICAL DATA:  NG tube placement. EXAM: PORTABLE ABDOMEN - 1 VIEW COMPARISON:  Abdominal radiograph obtained yesterday. FINDINGS: Tip of the weighted enteric tube is unchanged in position in the right upper quadrant, in the region of the distal stomach or proximal duodenum. Nonobstructive bowel gas pattern. Moderate stool in the right colon. IMPRESSION: Tip of the weighted enteric tube unchanged in position, either in the distal stomach or proximal duodenum. Electronically Signed   By: Jeb Levering M.D.   On: 11/12/2015 21:46   Dg Hip Operative Unilat With Pelvis Right  11/08/2015  CLINICAL DATA:  IM nail of right hip EXAM: OPERATIVE right HIP (WITH PELVIS IF PERFORMED) 2 VIEWS TECHNIQUE: Fluoroscopic spot image(s) were submitted for interpretation post-operatively. COMPARISON:  11/04/2015  FINDINGS: Hip screw an IM nail reduces the intertrochanteric fracture involving the proximal right femur. The fracture fragments and hardware components are in anatomic alignment. No complications identified. IMPRESSION: 1. Status post ORIF of proximal femur fracture. Electronically Signed   By: Kerby Moors M.D.   On: 11/08/2015 11:04   Dg Hip Unilat With Pelvis 2-3 Views Right  11/08/2015  CLINICAL DATA:  Postop right hip pinning.  Confusion. EXAM: DG HIP (WITH OR WITHOUT PELVIS) 2-3V RIGHT COMPARISON:  Intraoperative fluoroscopic spot images from earlier same day. Comparison also made to earlier plain film of the right hip dated 11/04/2015. FINDINGS: Intra medullary rod with associated dynamic hip screw traverses the right femoral neck fracture site. Hardware appears intact and well aligned. Osseous alignment is anatomic. No surgical complicating features seen. IMPRESSION: IMPRESSION Status post fixation of the right femoral neck fracture. Hardware appears intact and well aligned. No surgical complicating feature. Electronically Signed   By: Franki Cabot M.D.   On: 11/08/2015 12:02   Dg Hip Unilat With Pelvis 2-3 Views Right  11/04/2015  CLINICAL DATA:  Pt fell from wheelchair at nursing home prior to arrival to Radiology. Severe pain in Rt hip with radiation into Rt knee. EXAM: DG HIP (WITH OR WITHOUT PELVIS) 2-3V RIGHT COMPARISON:  None. FINDINGS: Intertrochanteric fracture of the proximal RIGHT femur with varus angulation. There is superior migration of the distal fracture fragment. No dislocation. IMPRESSION: RIGHT intertrochanteric femur fracture. Electronically Signed   By: Suzy Bouchard M.D.   On: 11/04/2015 19:20     CBC  Recent Labs Lab 11/25/15 0730 11/29/15 1828  WBC 6.1 9.1  HGB 9.7* 9.3*  HCT 30.5* 29.1*  PLT 332 249  MCV 100.0 100.3*  MCH 31.8 32.1  MCHC 31.8 32.0  RDW 15.5 15.6*  LYMPHSABS  --  1.1  MONOABS  --  1.1*  EOSABS  --  0.4  BASOSABS  --  0.0     Chemistries   Recent Labs Lab 11/25/15 0735 11/27/15 0719 11/29/15 1828  NA 136 137  --   K 4.8 4.0  --   CL 104 103  --   CO2 27 27  --   GLUCOSE 98 174*  --   BUN 30* 14  --   CREATININE 1.58* 0.95  --   CALCIUM 8.7* 8.8*  --   AST 22  --  25  ALT 17  --  17  ALKPHOS 191*  --  184*  BILITOT 0.7  --  1.1   ------------------------------------------------------------------------------------------------------------------ estimated creatinine clearance is 51.3 mL/min (by C-G formula based on Cr of 0.95). ------------------------------------------------------------------------------------------------------------------ No results for input(s): HGBA1C in the last 72 hours. ------------------------------------------------------------------------------------------------------------------  No results for input(s): CHOL, HDL, LDLCALC, TRIG, CHOLHDL, LDLDIRECT in the last 72 hours. ------------------------------------------------------------------------------------------------------------------ No results for input(s): TSH, T4TOTAL, T3FREE, THYROIDAB in the last 72 hours.  Invalid input(s): FREET3 ------------------------------------------------------------------------------------------------------------------ No results for input(s): VITAMINB12, FOLATE, FERRITIN, TIBC, IRON, RETICCTPCT in the last 72 hours.  Coagulation profile No results for input(s): INR, PROTIME in the last 168 hours.  No results for input(s): DDIMER in the last 72 hours.  Cardiac Enzymes No results for input(s): CKMB, TROPONINI, MYOGLOBIN in the last 168 hours.  Invalid input(s): CK ------------------------------------------------------------------------------------------------------------------ Invalid input(s): POCBNP   CBG: No results for input(s): GLUCAP in the last 168 hours.     EKG: Independently reviewed. Normal sinus rhythm   Assessment/Plan Principal Problem: GI bleed: Acute. Patient  was found to have dark red blood in bowel movements from the nursing facility. Suspect most likely lower GI bleed given the acute findings on a CT scan suggestive of colitis - Admit to telemetry bed - Consult gastroenterology in a.m. - T&S for blood products - IV fluids and normal saline at 75 mL per hour - Protonix 40 mg IV q 12 hours - npo  Colitis: Acute. As seenseen on CT of abdomen and pelvis of the descending colon.  - Check C-reactive protein and ESR - Metronidazole and ciprofloxacin  Suspect acute blood loss anemia: Hemoglobin on admission 9.3, but suspect that patient's actual hemoglobin level could be lower. - Check h&h every  4 hours - Transfuse packed red blood cells if hemoglobin drops less than 8    Hypotension: Acute on chronic. Patient has a history of hypertension for which she is on medications such as metoprolol. - Hold metoprolol for now secondary to intermittent hypotension  Paroxysmal atrial fibrillation on anticoagulation of Eliquis: Patient was found to be in sinus rhythm on admission. Chadsvasc score 5.  - Held Eliquis due to concern for possible acute bleed - Continue amiodarone    Acute kidney injury superimposed on chronic kidney disease ([place]): Acute. Patient with baseline creatinine of 0.95 , but acutely elevated at 1.8 - IV fluids of normal saline at 75 mL per hour - Recheck BMP in a.m.  Hyperlipidemia -Continue simvastatin  Anxiety -Continue Xanax as needed  and Klonopin   DVT prophylaxis SCDs  Code Status:  DO NOT RESUSCITATE Family Communication: bedside Disposition Plan: admit   Total time spent 55 minutes.Greater than 50% of this time was spent in counseling, explanation of diagnosis, planning of further management, and coordination of care  [facility] Pager [phone]  If 7PM-7AM, please contact night-coverage [email or web address] Password [facility] 11/29/2015, 8:23 PM

## 2015-11-29 NOTE — ED Notes (Signed)
Pt from [facility]. Per EMS, tech went to change pt and when he pulled down her diaper, "dark red blood poured out." Pt had a recent hip fracture. Pt is at baseline with her mental status per facility.

## 2015-11-29 NOTE — ED Notes (Signed)
Attempted to call report to receiving nurse but nurse unable and will call back

## 2015-11-29 NOTE — ED Provider Notes (Signed)
CSN: [id number]     Arrival date & time 11/29/15  1731 History   First MD Initiated Contact with Patient 11/29/15 1746     Chief Complaint  Patient presents with  . Rectal Bleeding     (Consider location/radiation/quality/duration/timing/severity/associated sxs/prior Treatment) Patient is a 75 y.o. female presenting with hematochezia. The history is provided by the nursing home (Patient started having dark red blood per rectum today).  Rectal Bleeding Quality:  [place] Timing:  Constant Progression:  Worsening Chronicity:  New Context: not anal fissures   Similar prior episodes: no   Relieved by:  Nothing   Past Medical History  Diagnosis Date  . CVA (cerebral infarction)     speech difficulities  . Stroke ([place])   . Hypercholesteremia     Takes Zocor daily  . Hypertension     takes Atenolol and Lisinopril daily  . Lung mass     left upper lobe  . Arthritis     hands  . TIA (transient ischemic attack)     Sat before thanksgiving;impaired speech  . Pneumothorax   . Lung cancer ([place])   . CKD (chronic kidney disease), stage III   . Fracture, hip ([place])   . CHF (congestive heart failure) ([place])   . Muscle weakness (generalized)   . Dysphagia   . COPD (chronic obstructive pulmonary disease) ([place])   . Falls   . Abnormal posture    Past Surgical History  Procedure Laterality Date  . No past surgeries    . Lung biopsy  10/13/11    left  . Abdominal hysterectomy  1975  . Varicose vein surgery  68yr ago  . Video assisted thoracoscopy  11/18/2011    Procedure: VIDEO ASSISTED THORACOSCOPY;  Surgeon: SMelrose Nakayama MD;  [facility]  Service: Thoracic;  Laterality: Left;  . Lobectomy  11/18/2011    Procedure: LOBECTOMY;  Surgeon: SMelrose Nakayama MD;  [facility]  Service: Thoracic;  Laterality: Left;  LEFT UPPER LOBECTOMY  . Breast biopsy  early 2000  . Intramedullary (im) nail intertrochanteric Right 11/08/2015    Procedure: INTRAMEDULLARY (IM) NAIL  INTERTROCHANTERIC;  Surgeon: BRod Can MD;  [facility]  Service: Orthopedics;  Laterality: Right;   Family History  Problem Relation Age of Onset  . Stroke Mother   . Stroke Sister   . Anesthesia problems Neg Hx   . Hypotension Neg Hx   . Malignant hyperthermia Neg Hx   . Pseudochol deficiency Neg Hx   . Diabetes Brother   . Cancer Brother   . Stroke Brother    Social History  Substance Use Topics  . Smoking status: Never Smoker   . Smokeless tobacco: Never Used  . Alcohol Use: No   OB History    No data available     Review of Systems  Unable to perform ROS: Dementia  Gastrointestinal: Positive for hematochezia.      Allergies  Review of patient's allergies indicates no known allergies.  Home Medications   Prior to Admission medications   Medication Sig Start Date End Date Taking? Authorizing Provider  acetaminophen (TYLENOL) 500 MG tablet Take 1 tablet (500 mg total) by mouth every 8 (eight) hours. 11/17/15  Yes JGeradine Girt DO  ALPRAZolam (XANAX) 0.25 MG tablet Take 0.25 mg by mouth 3 (three) times daily as needed for anxiety.   Yes Historical Provider, MD  amiodarone (PACERONE) 400 MG tablet Take 1 tablet (400 mg total) by mouth 2 (two) times  daily. 11/17/15  Yes Geradine Girt, DO  apixaban (ELIQUIS) 5 MG TABS tablet Take 1 tablet (5 mg total) by mouth 2 (two) times daily. 11/17/15  Yes Geradine Girt, DO  clonazePAM (KLONOPIN) 0.5 MG tablet Take 0.5 mg by mouth 2 (two) times daily.   Yes Historical Provider, MD  DULoxetine (CYMBALTA) 60 MG capsule Take 60 mg by mouth daily.   Yes Historical Provider, MD  metoprolol (LOPRESSOR) 50 MG tablet Take 1 tablet (50 mg total) by mouth 2 (two) times daily. 11/17/15  Yes Geradine Girt, DO  omeprazole (PRILOSEC) 20 MG capsule Take 20 mg by mouth daily.   Yes Historical Provider, MD  simvastatin (ZOCOR) 20 MG tablet Take 20 mg by mouth daily.   Yes Historical Provider, MD  traMADol (ULTRAM) 50 MG tablet Take 50 mg  by mouth every 6 (six) hours as needed for moderate pain.    Yes Historical Provider, MD  traZODone (DESYREL) 25 mg TABS tablet Take 25 mg by mouth at bedtime as needed for sleep.   Yes Historical Provider, MD  Maltodextrin-Xanthan Gum (RESOURCE THICKENUP CLEAR) POWD As needed 11/17/15   Geradine Girt, DO  pantoprazole (PROTONIX) 20 MG tablet Take 1 tablet (20 mg total) by mouth 2 (two) times daily. 10/30/15   Merrily Pew, MD  senna-docusate (SENOKOT-S) 8.6-50 MG per tablet Take 1 tablet by mouth at bedtime as needed for mild constipation. 03/17/15   Radene Gunning, NP   BP 108/54 mmHg  Pulse 47  Temp(Src) 97.4 F (36.3 C) (Oral)  Resp 18  SpO2 100% Physical Exam  Constitutional:  Cachectic  HENT:  Head: Normocephalic.  Eyes: Conjunctivae and EOM are normal. No scleral icterus.  Neck: Neck supple. No thyromegaly present.  Cardiovascular: Normal rate and regular rhythm.  Exam reveals no gallop and no friction rub.   No murmur heard. Pulmonary/Chest: No stridor. She has no wheezes. She has no rales. She exhibits no tenderness.  Abdominal: She exhibits no distension. There is no tenderness. There is no rebound.  Genitourinary:  Rectal exam dark red blood heme positive  Musculoskeletal:  Scars and right hip from hip surgery 3 weeks ago  Lymphadenopathy:    She has no cervical adenopathy.  Neurological: She exhibits normal muscle tone. Coordination normal.  Oriented to person only  Skin: No rash noted. No erythema.    ED Course  Procedures (including critical care time) Labs Review Labs Reviewed  CBC WITH DIFFERENTIAL/PLATELET - Abnormal; Notable for the following:    RBC 2.90 (*)    Hemoglobin 9.3 (*)    HCT 29.1 (*)    MCV 100.3 (*)    RDW 15.6 (*)    Monocytes Absolute 1.1 (*)    All other components within normal limits  HEPATIC FUNCTION PANEL - Abnormal; Notable for the following:    Total Protein 6.4 (*)    Albumin 2.6 (*)    Alkaline Phosphatase 184 (*)    All other  components within normal limits  I-STAT CHEM 8, ED  TYPE AND SCREEN    Imaging Review Ct Abdomen Pelvis W Contrast  11/29/2015  CLINICAL DATA:  Dark red blood in adult diaper today. On 11/06/2015, with IM nail placed on 11/08/2015. History of lung cancer and hypertension. EXAM: CT ABDOMEN AND PELVIS WITH CONTRAST TECHNIQUE: Multidetector CT imaging of the abdomen and pelvis was performed using the standard protocol following bolus administration of intravenous contrast. CONTRAST:  162m OMNIPAQUE IOHEXOL 300 MG/ML  SOLN COMPARISON:  Multiple exams, including 04/11/2015 FINDINGS: Lower chest: Interstitial and airspace opacities in both lower lobes into a lesser degree in the right middle lobe. Coronary atherosclerosis and mild cardiomegaly. Mitral annular calcification. Hepatobiliary: Mildly prominent caudate lobe and lateral segment, possibly an early morphologic finding of cirrhosis. Dependent density in the gallbladder may reflect sludge or small gallstones. Mildly distended gallbladder. No biliary dilatation. No significant abnormal focal hepatic lesion. Pancreas: Unremarkable Spleen: Unremarkable Adrenals/Urinary Tract: Mild bilateral renal atrophy. Bilateral peripelvic cysts. Several small hypodense lesions of the right kidney are statistically likely to represent cysts although it is technically nonspecific due to small size. In addition, there is a 1.6 cm right mid kidney region of hypoenhancement on urographic phase images. There may be some overlying scarring in this vicinity on portal venous phase images. No findings of leak from the urinary bladder. Stomach/Bowel: Sigmoid colon diverticulosis. Wall thickening in the descending colon appears abnormal. Mild pericolic stranding in the descending colon. Vascular/Lymphatic: Aortoiliac atherosclerotic vascular disease. 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 This has not changed appreciably from 04/11/2015. The celiac  trunk, SMA, and IMA appear patent. Reproductive: Uterus absent. Left ovary unremarkable, probable small right ovarian remnant likewise unremarkable. Other: If No supplemental non-categorized findings. Musculoskeletal: Right sacral alar fracture again noted. Fractures of the right superior pubic ramus, right pubic body, and right inferior pubic ramus show early healing response. Comminuted left proximal femoral fracture involving the femoral neck and intertrochanteric region observe for thigh and nail in place, no obvious complicating feature. There still some hematoma and edema in the vicinity of the fractures. Anterior wedging at L1 is likely chronic. There is mild left foraminal stenosis at L4-5 as well as moderate left and prominent right foraminal stenosis at L5-S1 due to disc uncovering, disc bulge, facet arthropathy, and right foraminal disc protrusion at L5-S1. IMPRESSION: 1. There is abnormal wall thickening in the descending colon with surrounding stranding. I am suspicious for infectious colitis. This could be a cause for rectal bleeding. 2. On urographic phase images there is indistinct hypodensity in the right mid kidney. This is technically nonspecific but could reflect a small region of pyelonephritis. Correlate with urine analysis. 3. Healing fractures of the right pelvis. Right hip IM nail in place traversing comminuted fracture without complicating feature observed. 4. Bibasilar interstitial and airspace opacities, possibly from edema or less likely pneumonia. 5. Other imaging findings of potential clinical significance: Coronary atherosclerosis and mild cardiomegaly. Mitral annular calcification. Suspected early morphologic signs of hepatic cirrhosis. Possible small layering gallstones in the gallbladder. The gallbladder is mildly distended but not thick-walled. Mild bilateral renal atrophy. Bilateral peripelvic cysts. Small right renal cysts. Sigmoid colon diverticulosis. Aortoiliac  atherosclerotic vascular disease. Foraminal impingement at L4-5 and L5-S1. Electronically Signed   By: Van Clines M.D.   On: 11/29/2015 19:26   I have personally reviewed and evaluated these images and lab results as part of my medical decision-making.   EKG Interpretation None     Lab results sodium 138 chloride 105 potassium 4.8 CO2 24 BUN 38 creatinine 1.8 glucose 99 MDM   Final diagnoses:  Rectal bleeding    Patient will be admitted for colitis rectal bleeding    Milton Ferguson, MD 11/29/15 2014

## 2015-11-29 NOTE — ED Notes (Signed)
Pt incont of stool, stool loose and brown in color, foul smelling. Linens changed and perineal cleaned, pt changed into gown as well

## 2015-11-30 ENCOUNTER — Encounter (HOSPITAL_COMMUNITY): Payer: Self-pay | Admitting: Gastroenterology

## 2015-11-30 DIAGNOSIS — K529 Noninfective gastroenteritis and colitis, unspecified: Secondary | ICD-10-CM

## 2015-11-30 DIAGNOSIS — K922 Gastrointestinal hemorrhage, unspecified: Principal | ICD-10-CM

## 2015-11-30 LAB — POCT I-STAT, CHEM 8
BUN: 36 mg/dL — AB (ref 6–20)
CHLORIDE: 105 mmol/L (ref 101–111)
Calcium, Ion: 1.16 mmol/L (ref 1.13–1.30)
Creatinine, Ser: 1.8 mg/dL — ABNORMAL HIGH (ref 0.44–1.00)
Glucose, Bld: 99 mg/dL (ref 65–99)
HCT: 30 % — ABNORMAL LOW (ref 36.0–46.0)
Hemoglobin: 10.2 g/dL — ABNORMAL LOW (ref 12.0–15.0)
POTASSIUM: 4.8 mmol/L (ref 3.5–5.1)
SODIUM: 138 mmol/L (ref 135–145)
TCO2: 24 mmol/L (ref 0–100)

## 2015-11-30 LAB — HEMOGLOBIN AND HEMATOCRIT, BLOOD
HCT: 29 % — ABNORMAL LOW (ref 36.0–46.0)
HCT: 29.2 % — ABNORMAL LOW (ref 36.0–46.0)
HCT: 28.4 % — ABNORMAL LOW (ref 36.0–46.0)
HEMOGLOBIN: 9.1 g/dL — AB (ref 12.0–15.0)
HEMOGLOBIN: 9.5 g/dL — AB (ref 12.0–15.0)
Hemoglobin: 9.2 g/dL — ABNORMAL LOW (ref 12.0–15.0)

## 2015-11-30 LAB — MRSA PCR SCREENING: MRSA BY PCR: NEGATIVE

## 2015-11-30 LAB — C-REACTIVE PROTEIN: CRP: 12.4 mg/dL — AB (ref <1.0)

## 2015-11-30 MED ORDER — SODIUM CHLORIDE 0.9 % IV SOLN
INTRAVENOUS | Status: DC
  Administered 2015-11-30 – 2015-12-02 (×4): via INTRAVENOUS

## 2015-11-30 MED ORDER — ENSURE ENLIVE PO LIQD
237.0000 mL | ORAL | Status: DC
  Administered 2015-12-01: 237 mL via ORAL

## 2015-11-30 MED ORDER — PANTOPRAZOLE SODIUM 40 MG PO TBEC
40.0000 mg | DELAYED_RELEASE_TABLET | Freq: Every day | ORAL | Status: DC
  Administered 2015-12-02: 40 mg via ORAL
  Filled 2015-11-30: qty 1

## 2015-11-30 NOTE — Progress Notes (Signed)
Initial Nutrition Assessment  DOCUMENTATION CODES:   Non-severe (moderate) malnutrition in context of chronic illness  INTERVENTION:   Magic Cup TID with meals, each supplement provides 290 calories and 9 grams of protein.  Ensure Enlive 1x/day, each supplement provides 350 calories and 20 grams of protein.   NUTRITION DIAGNOSIS:   Inadequate oral intake related to lethargy/confusion, poor appetite as evidenced by meal completion < 25%, per patient/family report, percent weight loss.  GOAL:   Patient will meet greater than or equal to 90% of their needs  MONITOR:   PO intake, Supplement acceptance, Labs, Weight trends  REASON FOR ASSESSMENT:   Low [place]   ASSESSMENT:   75 year old female with a past medical history of CVA, hypertension, hyperlipidemia, CKD stage 3, atrial fibrillation on Eliquis, lung cancer, s/p right hip replacement on 1/8; who presents from skilled nursing facility with complaints of rectal bleeding. Patient's history is obtained from the family, and they state no previous history of bleeding previously. Patient complains of right hip pain. However they've also noticed that her blood pressure has been intermittently low in recent weeks.    Patient sleeping at the time of visit.  Two daughters were present and provided information on patients nutrition.  Prior to hip fracture about 1 month ago, patient had a good appetite.  Since the hip fracture patient has had a decreased appetite, taking only a few bits of food provided.  Family members stated that they must feed her if she is going to eat anything.  Noted that lunch tray was still in the room untouched.  Family member had brought some biscuits and gravy for the patient once she wakes up.    Daughters expressed concern for the patients nutritional status.  They noted that her clothes were a lot looser than they had been previously.  Nutrition Focused Physical Exam was completed. Findings were mild fat and  muscle depletion. RD encouraged family to continue encouraging pt to eat/drink food and supplements to meet calorie and protein needs.  Family reports that when patient was at [facility] she enjoyed the [employer]  RD to order Magic Cup to be delivered with each meal.  Will also order Ensure [facility] 1x/day for patient to try.      Diet Order:  Diet Heart Room service appropriate?: Yes; Fluid consistency:: Thin  Skin:  Reviewed, no issues  Last BM:  11/29/15  Height:   Ht Readings from Last 1 Encounters:  11/29/15 '5\' 5"'$  (1.651 m)   Weight:   Wt Readings from Last 1 Encounters:  11/29/15 138 lb 10.7 oz (62.9 kg)   Ideal Body Weight:  56.8 kg  BMI:  Body mass index is 23.08 kg/(m^2).  Estimated Nutritional Needs:   Kcal:  1600/1800  Protein:  85 - 100 grams  Fluid:  1.6 - 1.8 L  EDUCATION NEEDS:   No education needs identified at this time  Veronda Prude, Dietetic Intern Pager: [phone]

## 2015-11-30 NOTE — Consult Note (Signed)
Referring Provider: Dr. Tamala Julian  Primary Care Physician:  [name], MD Primary Gastroenterologist:  Dr. Gala Romney   Date of Admission: 11/29/15 Date of Consultation: 11/30/15  Reason for Consultation:  Rectal bleeding   HPI:  Stephanie [name] is a 75 year old female who was actually just discharged several weeks ago after admission for fall with right intertrochanteric fracture, new onset afib with RVR. Underwent IM nail of right femur 11/08/15. Due to new onset afib, she was started on Eliquis. History of CVA in 2012, with TIA in Dec 2016, with aphasia since 2012. She is a resident of [facility]. Brother at bedside. Presented to the ED yesterday with reports from nursing home of dark red blood per rectum. Rectal exam completed in ED with dark red blood noted and heme positive. Admitting Hgb 9.3 Appears she has been trending in the 9-10 range.     Brother states had a "good amount" in diaper. Tapered off overnight. No diarrhea. Blood thinner has been held as of admission. No complaints of abdominal pain. No nausea/vomiting. Loose stool reported but none documented in epic. Exposure to antibiotics at time of hip repair .  Moaning. Brother states this has been going on since before the hip. Waxes and wanes. Poor appetite. Will eat some foods such as chicken and dumplings, gravy biscuit, but only small amounts. Prior to hip repair, had a good appetite. No prior colonoscopy that they are aware. No fever.   Past Medical History  Diagnosis Date  . CVA (cerebral infarction)     speech difficulities  . Stroke ([place])   . Hypercholesteremia     Takes Zocor daily  . Hypertension     takes Atenolol and Lisinopril daily  . Lung mass     left upper lobe  . Arthritis     hands  . TIA (transient ischemic attack)     Sat before thanksgiving;impaired speech  . Pneumothorax   . Lung cancer ([place])   . CKD (chronic kidney disease), stage III   . Fracture, hip ([place])   . CHF (congestive heart failure) ([place])   .  Muscle weakness (generalized)   . Dysphagia   . COPD (chronic obstructive pulmonary disease) ([place])   . Falls   . Abnormal posture     Past Surgical History  Procedure Laterality Date  . Lung biopsy  10/13/11    left  . Abdominal hysterectomy  1975  . Varicose vein surgery  2yr ago  . Video assisted thoracoscopy  11/18/2011    Procedure: VIDEO ASSISTED THORACOSCOPY;  Surgeon: SMelrose Nakayama MD;  [facility]  Service: Thoracic;  Laterality: Left;  . Lobectomy  11/18/2011    Procedure: LOBECTOMY;  Surgeon: SMelrose Nakayama MD;  [facility]  Service: Thoracic;  Laterality: Left;  LEFT UPPER LOBECTOMY  . Breast biopsy  early 2000  . Intramedullary (im) nail intertrochanteric Right 11/08/2015    Procedure: INTRAMEDULLARY (IM) NAIL INTERTROCHANTERIC;  Surgeon: BRod Can MD;  [facility]  Service: Orthopedics;  Laterality: Right;    Prior to Admission medications   Medication Sig Start Date End Date Taking? Authorizing Provider  acetaminophen (TYLENOL) 500 MG tablet Take 1 tablet (500 mg total) by mouth every 8 (eight) hours. 11/17/15  Yes JGeradine Girt DO  ALPRAZolam (XANAX) 0.25 MG tablet Take 0.25 mg by mouth 3 (three) times daily as needed for anxiety.   Yes Historical Provider, MD  amiodarone (PACERONE) 400 MG tablet Take 1 tablet (400 mg total)  by mouth 2 (two) times daily. 11/17/15  Yes Geradine Girt, DO  apixaban (ELIQUIS) 5 MG TABS tablet Take 1 tablet (5 mg total) by mouth 2 (two) times daily. 11/17/15  Yes Geradine Girt, DO  clonazePAM (KLONOPIN) 0.5 MG tablet Take 0.5 mg by mouth 2 (two) times daily.   Yes Historical Provider, MD  DULoxetine (CYMBALTA) 60 MG capsule Take 60 mg by mouth daily.   Yes Historical Provider, MD  metoprolol (LOPRESSOR) 50 MG tablet Take 1 tablet (50 mg total) by mouth 2 (two) times daily. 11/17/15  Yes Geradine Girt, DO  omeprazole (PRILOSEC) 20 MG capsule Take 20 mg by mouth daily.   Yes Historical Provider, MD   simvastatin (ZOCOR) 20 MG tablet Take 20 mg by mouth daily.   Yes Historical Provider, MD  traMADol (ULTRAM) 50 MG tablet Take 50 mg by mouth every 6 (six) hours as needed for moderate pain.    Yes Historical Provider, MD  traZODone (DESYREL) 25 mg TABS tablet Take 25 mg by mouth at bedtime as needed for sleep.   Yes Historical Provider, MD  Maltodextrin-Xanthan Gum (RESOURCE THICKENUP CLEAR) POWD As needed 11/17/15   Geradine Girt, DO  pantoprazole (PROTONIX) 20 MG tablet Take 1 tablet (20 mg total) by mouth 2 (two) times daily. 10/30/15   Merrily Pew, MD  senna-docusate (SENOKOT-S) 8.6-50 MG per tablet Take 1 tablet by mouth at bedtime as needed for mild constipation. 03/17/15   Radene Gunning, NP    Current Facility-Administered Medications  Medication Dose Route Frequency Provider Last Rate Last Dose  . 0.9 %  sodium chloride infusion   Intravenous Continuous Domenic Polite, MD 75 mL/hr at 11/30/15 0810    . acetaminophen (TYLENOL) tablet 500 mg  500 mg Oral Q6H PRN Norval Morton, MD      . ALPRAZolam Duanne Moron) tablet 0.25 mg  0.25 mg Oral TID PRN Norval Morton, MD   0.25 mg at 11/29/15 2232  . amiodarone (PACERONE) tablet 400 mg  400 mg Oral BID Norval Morton, MD   400 mg at 11/29/15 2233  . clonazePAM (KLONOPIN) tablet 0.5 mg  0.5 mg Oral BID Norval Morton, MD   0.5 mg at 11/29/15 2233  . DULoxetine (CYMBALTA) DR capsule 60 mg  60 mg Oral Daily Rondell A Tamala Julian, MD      . morphine 2 MG/ML injection 1 mg  1 mg Intravenous Q2H PRN Norval Morton, MD   1 mg at 11/30/15 0843  . ondansetron (ZOFRAN) tablet 4 mg  4 mg Oral Q6H PRN Norval Morton, MD       Or  . ondansetron (ZOFRAN) injection 4 mg  4 mg Intravenous Q6H PRN Rondell A Tamala Julian, MD      . pantoprazole (PROTONIX) injection 40 mg  40 mg Intravenous Q12H Norval Morton, MD   40 mg at 11/29/15 2232  . simvastatin (ZOCOR) tablet 20 mg  20 mg Oral Daily Rondell A Tamala Julian, MD      . sodium chloride flush (NS) 0.9 % injection 3 mL  3  mL Intravenous Q12H Norval Morton, MD   3 mL at 11/29/15 2233  . traMADol (ULTRAM) tablet 50 mg  50 mg Oral Q6H PRN Norval Morton, MD      . traZODone (DESYREL) tablet 25 mg  25 mg Oral QHS PRN Norval Morton, MD        Allergies as of 11/29/2015  . (No  Known Allergies)    Family History  Problem Relation Age of Onset  . Stroke Mother   . Stroke Sister   . Anesthesia problems Neg Hx   . Hypotension Neg Hx   . Malignant hyperthermia Neg Hx   . Pseudochol deficiency Neg Hx   . Diabetes Brother   . Cancer Brother   . Stroke Brother   . Colon cancer Neg Hx     Social History   Social History  . Marital Status: Widowed    Spouse Name: N/A  . Number of Children: N/A  . Years of Education: N/A   Occupational History  . Not on file.   Social History Main Topics  . Smoking status: Never Smoker   . Smokeless tobacco: Never Used  . Alcohol Use: No  . Drug Use: No  . Sexual Activity: Not Currently    Birth Control/ Protection: Post-menopausal, Surgical   Other Topics Concern  . Not on file   Social History Narrative    Review of Systems: Unable to obtain due to cognitive status.   Physical Exam: Vital signs in last 24 hours: Temp:  [97.4 F (36.3 C)-97.5 F (36.4 C)] 97.5 F (36.4 C) (01/30 0559) Pulse Rate:  [46-58] 58 (01/30 0559) Resp:  [11-20] 20 (01/30 0559) BP: (88-117)/(49-59) 108/50 mmHg (01/30 0559) SpO2:  [95 %-100 %] 95 % (01/29 2202) Weight:  [138 lb 10.7 oz (62.9 kg)] 138 lb 10.7 oz (62.9 kg) (01/29 2202) Last BM Date: 11/29/15 General:   Resting with eyes closed but opens to verbal stimuli. Moaning. Unable to assess orientation Head:  Normocephalic and atraumatic. Eyes:  Sclera clear, no icterus.   Conjunctiva pink. Lungs:  Clear throughout to auscultation.   No wheezes, crackles, or rhonchi. No acute distress. Heart:  S1 S2 present with systolic murmur  Abdomen:  Soft, nontender and nondistended. No masses, hepatosplenomegaly or hernias  noted.  Rectal:  Deferred  Msk:  Right leg bent in contracted position, dressing on right hip Extremities:  Without edema. Neurologic:  Eyes closed but awakens to verbal stimuli. Unable to assess orientation Psych:  Moaning and crying   Intake/Output from previous day: 01/29 0701 - 01/30 0700 In: 1000 [I.V.:1000] Out: -  Intake/Output this shift:    Lab Results:  Recent Labs  11/29/15 1828  11/29/15 2232 11/30/15 0249 11/30/15 0704  WBC 9.1  --   --   --   --   HGB 9.3*  < > 10.4* 9.1* 9.5*  HCT 29.1*  < > 33.7* 29.0* 29.2*  PLT 249  --   --   --   --   < > = values in this interval not displayed. BMET  Recent Labs  11/29/15 1840  NA 138  K 4.8  CL 105  GLUCOSE 99  BUN 36*  CREATININE 1.80*   LFT  Recent Labs  11/29/15 1828  PROT 6.4*  ALBUMIN 2.6*  AST 25  ALT 17  ALKPHOS 184*  BILITOT 1.1  BILIDIR 0.2  IBILI 0.9    Studies/Results: Ct Abdomen Pelvis W Contrast  11/29/2015  CLINICAL DATA:  Dark red blood in adult diaper today. On 11/06/2015, with IM nail placed on 11/08/2015. History of lung cancer and hypertension. EXAM: CT ABDOMEN AND PELVIS WITH CONTRAST TECHNIQUE: Multidetector CT imaging of the abdomen and pelvis was performed using the standard protocol following bolus administration of intravenous contrast. CONTRAST:  133m OMNIPAQUE IOHEXOL 300 MG/ML  SOLN COMPARISON:  Multiple exams, including 04/11/2015 FINDINGS:  Lower chest: Interstitial and airspace opacities in both lower lobes into a lesser degree in the right middle lobe. Coronary atherosclerosis and mild cardiomegaly. Mitral annular calcification. Hepatobiliary: Mildly prominent caudate lobe and lateral segment, possibly an early morphologic finding of cirrhosis. Dependent density in the gallbladder may reflect sludge or small gallstones. Mildly distended gallbladder. No biliary dilatation. No significant abnormal focal hepatic lesion. Pancreas: Unremarkable Spleen: Unremarkable  Adrenals/Urinary Tract: Mild bilateral renal atrophy. Bilateral peripelvic cysts. Several small hypodense lesions of the right kidney are statistically likely to represent cysts although it is technically nonspecific due to small size. In addition, there is a 1.6 cm right mid kidney region of hypoenhancement on urographic phase images. There may be some overlying scarring in this vicinity on portal venous phase images. No findings of leak from the urinary bladder. Stomach/Bowel: Sigmoid colon diverticulosis. Wall thickening in the descending colon appears abnormal. Mild pericolic stranding in the descending colon. Vascular/Lymphatic: Aortoiliac atherosclerotic vascular disease. 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 This has not changed appreciably from 04/11/2015. The celiac trunk, SMA, and IMA appear patent. Reproductive: Uterus absent. Left ovary unremarkable, probable small right ovarian remnant likewise unremarkable. Other: If No supplemental non-categorized findings. Musculoskeletal: Right sacral alar fracture again noted. Fractures of the right superior pubic ramus, right pubic body, and right inferior pubic ramus show early healing response. Comminuted left proximal femoral fracture involving the femoral neck and intertrochanteric region observe for thigh and nail in place, no obvious complicating feature. There still some hematoma and edema in the vicinity of the fractures. Anterior wedging at L1 is likely chronic. There is mild left foraminal stenosis at L4-5 as well as moderate left and prominent right foraminal stenosis at L5-S1 due to disc uncovering, disc bulge, facet arthropathy, and right foraminal disc protrusion at L5-S1. IMPRESSION: 1. There is abnormal wall thickening in the descending colon with surrounding stranding. I am suspicious for infectious colitis. This could be a cause for rectal bleeding. 2. On urographic phase images there is indistinct hypodensity in  the right mid kidney. This is technically nonspecific but could reflect a small region of pyelonephritis. Correlate with urine analysis. 3. Healing fractures of the right pelvis. Right hip IM nail in place traversing comminuted fracture without complicating feature observed. 4. Bibasilar interstitial and airspace opacities, possibly from edema or less likely pneumonia. 5. Other imaging findings of potential clinical significance: Coronary atherosclerosis and mild cardiomegaly. Mitral annular calcification. Suspected early morphologic signs of hepatic cirrhosis. Possible small layering gallstones in the gallbladder. The gallbladder is mildly distended but not thick-walled. Mild bilateral renal atrophy. Bilateral peripelvic cysts. Small right renal cysts. Sigmoid colon diverticulosis. Aortoiliac atherosclerotic vascular disease. Foraminal impingement at L4-5 and L5-S1. Electronically Signed   By: Van Clines M.D.   On: 11/29/2015 19:26   Dg Chest Port 1 View  11/29/2015  CLINICAL DATA:  Shortness of breath. EXAM: PORTABLE CHEST 1 VIEW COMPARISON:  11/04/2015 FINDINGS: Increase in diffuse lung opacity. There is a background of low volumes and interstitial lung disease. Chronic cardiomegaly, distorted by leftward rotation and left-sided volume loss. No gross effusion. No pneumothorax. IMPRESSION: 1. Lung opacity above baseline with diffuse appearance suggesting edema. 2. Background interstitial lung disease with fibrosis. Electronically Signed   By: Monte Fantasia M.D.   On: 11/29/2015 22:57    Impression: 75 year old female with an extensive medical history, presenting with rectal bleeding in the setting of Eliquis, which has been held now. Does not appear to be  hemodynamically significant, with Hgb remaining around baseline for patient. Also appears to be resolving. Reports of loose stool per brother at bedside, although this is not documented in epic. CT with abnormal wall thickening in descending  colon, with concern for infectious colitis. Stool studies have not been obtained thus far. Differentials broad at this point and unable to rule out infectious colitis, ischemic colitis. As patient is unable to give history, it is unclear if she has had any other concerning GI symptoms. No prior colonoscopy per brother.   At this point, no colonoscopy indicated while inpatient unless hemodynamically significant rectal bleeding or no improvement in symptoms. Would offer supportive measures, check stool studies if any further diarrhea, and monitor H/H serially. Could consider colonoscopy in 6-8 weeks if she is clinically stable to do so. Will hold on antibiotics unless any evidence of further diarrhea.   Mildly prominent liver: could consider elastography in the future to exclude underlying cirrhosis. Non-specific mildly elevated alk phos. Recheck in am.   Plan: PPI once daily Serial H/H Check stool studies if any further diarrhea Hold on antibiotics for now  Consider colonoscopy as outpatient if clinically able to tolerate Eliquis remains on hold Consider elastography as outpatient in future Recheck HFP in am  Orvil Feil, ANP-BC [facility]    LOS: 1 day    11/30/2015, 9:58 AM

## 2015-11-30 NOTE — NC FL2 (Signed)
[place] LEVEL OF CARE SCREENING TOOL     IDENTIFICATION  Patient Name: Stephanie [name] Birthdate: [birth year] Sex: female Admission Date (Current Location): 11/29/2015  [facility] and [place] Number:  [employer] and Address:  [facility] [address], [place]      Provider Number: [id number]  Attending Physician Name and Address:  Domenic Polite, MD  Relative Name and Phone Number:  [name] daughter, [phone]    Current Level of Care: Hospital Recommended Level of Care: [place] Prior Approval Number:    Date Approved/Denied:   PASRR Number:    Discharge Plan: SNF    Current Diagnoses: Patient Active Problem List   Diagnosis Date Noted  . Rectal bleeding 11/29/2015  . Paroxysmal atrial fibrillation ([place]) 11/29/2015  . Colitis 11/29/2015  . Acute kidney injury superimposed on chronic kidney disease ([place]) 11/29/2015  . GI bleed 11/29/2015  . Malnutrition of moderate degree 11/12/2015  . DNR (do not resuscitate) 11/12/2015  . Palliative care encounter 11/12/2015  . Postoperative anemia due to acute blood loss 11/09/2015  . Fracture, intertrochanteric, right femur ([place]) 11/08/2015  . right superior and inferior pubic rami 11/07/2015  . Closed right hip fracture ([place])   . Atrial fibrillation ([place])   . Hip fracture, right ([place]) 11/04/2015  . CKD (chronic kidney disease), stage III 11/04/2015  . Sacral fracture, closed ([place]) 11/04/2015  . Chest pain 10/31/2015  . TIA (transient ischemic attack) 10/22/2015  . Dysphagia 10/22/2015  . Rash and nonspecific skin eruption 09/22/2015  . Cellulitis 07/31/2015  . Low back pain 07/22/2015  . Back pain 04/14/2015  . UTI (urinary tract infection) 04/14/2015  . History of CVA (cerebrovascular accident) 04/14/2015  . Mental status change 03/16/2015  . Hypotension 03/16/2015  . Acute encephalopathy 03/16/2015  . Encephalopathy acute 03/16/2015  . Altered mental  status   . Arterial hypotension   . Cough 11/10/2014  . Anemia 11/10/2014  . Dyspnea 10/16/2014  . Acute on chronic renal failure ([place]) 10/16/2014  . Bronchitis 10/15/2014  . Diastolic CHF, acute ([place]) 10/15/2014  . Hyperlipidemia 05/20/2014  . Lack of coordination 02/19/2013  . Muscle weakness (generalized) 02/19/2013  . Dysarthria as late effect of stroke 02/04/2013  . Bilateral carotid artery disease ([place]) 02/04/2013  . Other and unspecified hyperlipidemia 02/04/2013  . Adenocarcinoma of left lung, stage 1 ([place]) 10/19/2011  . Pneumothorax of left lung after biopsy 10/14/2011    Class: Acute  . CVA (cerebral infarction) 09/17/2011  . HTN (hypertension) 09/17/2011    Orientation RESPIRATION BLADDER Height & Weight     Self  Normal Incontinent Weight: 138 lb 10.7 oz (62.9 kg) Height:  '5\' 5"'$  (165.1 cm)  BEHAVIORAL SYMPTOMS/MOOD NEUROLOGICAL BOWEL NUTRITION STATUS      Incontinent    AMBULATORY STATUS COMMUNICATION OF NEEDS Skin   Extensive Assist Non-Verbally Surgical wounds                       Personal Care Assistance Level of Assistance  Bathing, Feeding, Dressing           Functional Limitations Info  Speech     Speech Info: Impaired    SPECIAL CARE FACTORS FREQUENCY                       Contractures      Additional Factors Info  Code Status, Allergies  Current Medications (11/30/2015):  This is the current hospital active medication list Current Facility-Administered Medications  Medication Dose Route Frequency Provider Last Rate Last Dose  . 0.9 %  sodium chloride infusion   Intravenous Continuous Domenic Polite, MD 75 mL/hr at 11/30/15 0810    . acetaminophen (TYLENOL) tablet 500 mg  500 mg Oral Q6H PRN Norval Morton, MD      . ALPRAZolam Duanne Moron) tablet 0.25 mg  0.25 mg Oral TID PRN Norval Morton, MD   0.25 mg at 11/29/15 2232  . amiodarone (PACERONE) tablet 400 mg  400 mg Oral BID Norval Morton, MD   400 mg at  11/30/15 1108  . clonazePAM (KLONOPIN) tablet 0.5 mg  0.5 mg Oral BID Norval Morton, MD   0.5 mg at 11/30/15 1108  . DULoxetine (CYMBALTA) DR capsule 60 mg  60 mg Oral Daily Norval Morton, MD   60 mg at 11/30/15 1108  . morphine 2 MG/ML injection 1 mg  1 mg Intravenous Q2H PRN Norval Morton, MD   1 mg at 11/30/15 1149  . ondansetron (ZOFRAN) tablet 4 mg  4 mg Oral Q6H PRN Norval Morton, MD       Or  . ondansetron (ZOFRAN) injection 4 mg  4 mg Intravenous Q6H PRN Rondell A Tamala Julian, MD      . pantoprazole (PROTONIX) injection 40 mg  40 mg Intravenous Q12H Norval Morton, MD   40 mg at 11/30/15 1109  . simvastatin (ZOCOR) tablet 20 mg  20 mg Oral Daily Norval Morton, MD   20 mg at 11/30/15 1108  . sodium chloride flush (NS) 0.9 % injection 3 mL  3 mL Intravenous Q12H Rondell Charmayne Sheer, MD   3 mL at 11/30/15 1000  . traMADol (ULTRAM) tablet 50 mg  50 mg Oral Q6H PRN Norval Morton, MD      . traZODone (DESYREL) tablet 25 mg  25 mg Oral QHS PRN Norval Morton, MD         Discharge Medications: Please see discharge summary for a list of discharge medications.  Relevant Imaging Results:  Relevant Lab Results:   Additional Information    Ludwig Clarks, LCSW

## 2015-11-30 NOTE — Progress Notes (Signed)
[facility] PROGRESS NOTE  Stephanie [name]:[id number] DOB: [birth year] DOA: 11/29/2015 PCP: Lilyan Punt, MD  Assessment/Plan: Lower GI bleed/Colitis:  - likely ischemic vs infectious - CT with thickening of descending colon.  - continue Metronidazole and ciprofloxacin - eliquis on hold - GI consulted -monitor Hb   Hypotension:  -due to above, improving -metoprolol on hold, resume as BP and HR tolerate  Paroxysmal atrial fibrillation -  Eliquis on hold as above, Chadsvasc score 5.  - Continue amiodarone   AKI on CKD2 -  baseline creatinine of 0.95 , but 1.8 on admission -  Continue IVF,at 75 mL per hour -  bmet in am  Hyperlipidemia -Continue simvastatin  Anxiety -Continue Xanax /Klonopin , home regimen  Debility/SNF resident  DVT prophylaxis SCDs  Code Status: DNR Family Communication: brother at bedside Disposition Plan: SNF when stable, ? 1-2days   Consultants:  GI  HPI/Subjective: Feels ok, reports of no further bleeding  Objective: Filed Vitals:   11/29/15 2202 11/30/15 0559  BP: 106/52 108/50  Pulse: 49 58  Temp: 97.5 F (36.4 C) 97.5 F (36.4 C)  Resp: 20 20    Intake/Output Summary (Last 24 hours) at 11/30/15 1414 Last data filed at 11/29/15 2023  Gross per 24 hour  Intake   1000 ml  Output      0 ml  Net   1000 ml   Filed Weights   11/29/15 2202  Weight: 62.9 kg (138 lb 10.7 oz)    Exam:   General: sleepy, just got some Iv dilaudid a little while back  Cardiovascular: S1S2/RRR  Respiratory: CTAB  Abdomen: soft, NT, BS present  Musculoskeletal: no edema c/c  Data Reviewed: Basic Metabolic Panel:  Recent Labs Lab 11/25/15 0735 11/27/15 0719 11/29/15 1840  NA 136 137 138  K 4.8 4.0 4.8  CL 104 103 105  CO2 27 27  --   GLUCOSE 98 174* 99  BUN 30* 14 36*  CREATININE 1.58* 0.95 1.80*  CALCIUM 8.7* 8.8*  --    Liver Function Tests:  Recent Labs Lab 11/25/15 0735 11/29/15 1828  AST 22 25  ALT 17 17   ALKPHOS 191* 184*  BILITOT 0.7 1.1  PROT 6.6 6.4*  ALBUMIN 2.6* 2.6*   No results for input(s): LIPASE, AMYLASE in the last 168 hours. No results for input(s): AMMONIA in the last 168 hours. CBC:  Recent Labs Lab 11/25/15 0730 11/29/15 1828 11/29/15 1840 11/29/15 2232 11/30/15 0249 11/30/15 0704 11/30/15 1027  WBC 6.1 9.1  --   --   --   --   --   NEUTROABS  --  6.5  --   --   --   --   --   HGB 9.7* 9.3* 10.2* 10.4* 9.1* 9.5* 9.2*  HCT 30.5* 29.1* 30.0* 33.7* 29.0* 29.2* 28.4*  MCV 100.0 100.3*  --   --   --   --   --   PLT 332 249  --   --   --   --   --    Cardiac Enzymes: No results for input(s): CKTOTAL, CKMB, CKMBINDEX, TROPONINI in the last 168 hours. BNP (last 3 results) No results for input(s): BNP in the last 8760 hours.  ProBNP (last 3 results) No results for input(s): PROBNP in the last 8760 hours.  CBG: No results for input(s): GLUCAP in the last 168 hours.  Recent Results (from the past 240 hour(s))  MRSA PCR Screening     Status: None  Collection Time: 11/29/15 11:09 PM  Result Value Ref Range Status   MRSA by PCR NEGATIVE NEGATIVE Final    Comment:        The GeneXpert MRSA Assay (FDA approved for NASAL specimens only), is one component of a comprehensive MRSA colonization surveillance program. It is not intended to diagnose MRSA infection nor to guide or monitor treatment for MRSA infections.      Studies: Ct Abdomen Pelvis W Contrast  11/29/2015  CLINICAL DATA:  Dark red blood in adult diaper today. On 11/06/2015, with IM nail placed on 11/08/2015. History of lung cancer and hypertension. EXAM: CT ABDOMEN AND PELVIS WITH CONTRAST TECHNIQUE: Multidetector CT imaging of the abdomen and pelvis was performed using the standard protocol following bolus administration of intravenous contrast. CONTRAST:  OMNIPAQUE IOHEXOL 300 MG/ML  SOLN COMPARISON:  Multiple exams, including 04/11/2015 FINDINGS: Lower chest: Interstitial and airspace  opacities in both lower lobes into a lesser degree in the right middle lobe. Coronary atherosclerosis and mild cardiomegaly. Mitral annular calcification. Hepatobiliary: Mildly prominent caudate lobe and lateral segment, possibly an early morphologic finding of cirrhosis. Dependent density in the gallbladder may reflect sludge or small gallstones. Mildly distended gallbladder. No biliary dilatation. No significant abnormal focal hepatic lesion. Pancreas: Unremarkable Spleen: Unremarkable Adrenals/Urinary Tract: Mild bilateral renal atrophy. Bilateral peripelvic cysts. Several small hypodense lesions of the right kidney are statistically likely to represent cysts although it is technically nonspecific due to small size. In addition, there is a 1.6 cm right mid kidney region of hypoenhancement on urographic phase images. There may be some overlying scarring in this vicinity on portal venous phase images. No findings of leak from the urinary bladder. Stomach/Bowel: Sigmoid colon diverticulosis. Wall thickening in the descending colon appears abnormal. Mild pericolic stranding in the descending colon. Vascular/Lymphatic: Aortoiliac atherosclerotic vascular disease. 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 This has not changed appreciably from 04/11/2015. The celiac trunk, SMA, and IMA appear patent. Reproductive: Uterus absent. Left ovary unremarkable, probable small right ovarian remnant likewise unremarkable. Other: If No supplemental non-categorized findings. Musculoskeletal: Right sacral alar fracture again noted. Fractures of the right superior pubic ramus, right pubic body, and right inferior pubic ramus show early healing response. Comminuted left proximal femoral fracture involving the femoral neck and intertrochanteric region observe for thigh and nail in place, no obvious complicating feature. There still some hematoma and edema in the vicinity of the fractures. Anterior wedging  at L1 is likely chronic. There is mild left foraminal stenosis at L4-5 as well as moderate left and prominent right foraminal stenosis at L5-S1 due to disc uncovering, disc bulge, facet arthropathy, and right foraminal disc protrusion at L5-S1. IMPRESSION: 1. There is abnormal wall thickening in the descending colon with surrounding stranding. I am suspicious for infectious colitis. This could be a cause for rectal bleeding. 2. On urographic phase images there is indistinct hypodensity in the right mid kidney. This is technically nonspecific but could reflect a small region of pyelonephritis. Correlate with urine analysis. 3. Healing fractures of the right pelvis. Right hip IM nail in place traversing comminuted fracture without complicating feature observed. 4. Bibasilar interstitial and airspace opacities, possibly from edema or less likely pneumonia. 5. Other imaging findings of potential clinical significance: Coronary atherosclerosis and mild cardiomegaly. Mitral annular calcification. Suspected early morphologic signs of hepatic cirrhosis. Possible small layering gallstones in the gallbladder. The gallbladder is mildly distended but not thick-walled. Mild bilateral renal atrophy. Bilateral peripelvic cysts.  Small right renal cysts. Sigmoid colon diverticulosis. Aortoiliac atherosclerotic vascular disease. Foraminal impingement at L4-5 and L5-S1. Electronically Signed   By: Gaylyn Rong M.D.   On: 11/29/2015 19:26   Dg Chest Port 1 View  11/29/2015  CLINICAL DATA:  Shortness of breath. EXAM: PORTABLE CHEST 1 VIEW COMPARISON:  11/04/2015 FINDINGS: Increase in diffuse lung opacity. There is a background of low volumes and interstitial lung disease. Chronic cardiomegaly, distorted by leftward rotation and left-sided volume loss. No gross effusion. No pneumothorax. IMPRESSION: 1. Lung opacity above baseline with diffuse appearance suggesting edema. 2. Background interstitial lung disease with fibrosis.  Electronically Signed   By: Marnee Spring M.D.   On: 11/29/2015 22:57    Scheduled Meds: . amiodarone  400 mg Oral BID  . clonazePAM  0.5 mg Oral BID  . DULoxetine  60 mg Oral Daily  . pantoprazole (PROTONIX) IV  40 mg Intravenous Q12H  . simvastatin  20 mg Oral Daily  . sodium chloride flush  3 mL Intravenous Q12H   Continuous Infusions: . sodium chloride 75 mL/hr at 11/30/15 0810   Antibiotics Given (last 72 hours)    Date/Time Action Medication Dose Rate   11/29/15 2224 Given   metroNIDAZOLE (FLAGYL) IVPB 500 mg 500 mg 100 mL/hr      Principal Problem:   GI bleed Active Problems:   Anemia   Hypotension   Rectal bleeding   Paroxysmal atrial fibrillation (HCC)   Colitis   Acute kidney injury superimposed on chronic kidney disease (HCC)    Time spent:    [facility]  [facility] Pager [phone]. If 7PM-7AM, please contact night-coverage at [email or web address], password [facility] 11/30/2015, 2:14 PM  LOS: 1 day

## 2015-12-01 ENCOUNTER — Encounter (HOSPITAL_COMMUNITY): Payer: Self-pay | Admitting: Primary Care

## 2015-12-01 LAB — BASIC METABOLIC PANEL
ANION GAP: 7 (ref 5–15)
BUN: 22 mg/dL — ABNORMAL HIGH (ref 6–20)
CO2: 22 mmol/L (ref 22–32)
Calcium: 8.5 mg/dL — ABNORMAL LOW (ref 8.9–10.3)
Chloride: 111 mmol/L (ref 101–111)
Creatinine, Ser: 1.34 mg/dL — ABNORMAL HIGH (ref 0.44–1.00)
GFR calc Af Amer: 44 mL/min — ABNORMAL LOW (ref >60)
GFR calc non Af Amer: 38 mL/min — ABNORMAL LOW (ref >60)
GLUCOSE: 100 mg/dL — AB (ref 65–99)
POTASSIUM: 4.2 mmol/L (ref 3.5–5.1)
Sodium: 140 mmol/L (ref 135–145)

## 2015-12-01 LAB — HEPATIC FUNCTION PANEL
ALBUMIN: 2.5 g/dL — AB (ref 3.5–5.0)
ALK PHOS: 183 U/L — AB (ref 38–126)
ALT: 16 U/L (ref 14–54)
AST: 23 U/L (ref 15–41)
Bilirubin, Direct: 0.2 mg/dL (ref 0.1–0.5)
Indirect Bilirubin: 0.4 mg/dL (ref 0.3–0.9)
TOTAL PROTEIN: 6.1 g/dL — AB (ref 6.5–8.1)
Total Bilirubin: 0.6 mg/dL (ref 0.3–1.2)

## 2015-12-01 LAB — CBC
HEMATOCRIT: 28.3 % — AB (ref 36.0–46.0)
Hemoglobin: 9 g/dL — ABNORMAL LOW (ref 12.0–15.0)
MCH: 31.9 pg (ref 26.0–34.0)
MCHC: 31.8 g/dL (ref 30.0–36.0)
MCV: 100.4 fL — AB (ref 78.0–100.0)
Platelets: 178 10*3/uL (ref 150–400)
RBC: 2.82 MIL/uL — AB (ref 3.87–5.11)
RDW: 15.6 % — ABNORMAL HIGH (ref 11.5–15.5)
WBC: 5.5 10*3/uL (ref 4.0–10.5)

## 2015-12-01 LAB — CALCIUM, IONIZED: CALCIUM, IONIZED, SERUM: 5.1 mg/dL (ref 4.5–5.6)

## 2015-12-01 MED ORDER — METOPROLOL TARTRATE 25 MG PO TABS
25.0000 mg | ORAL_TABLET | Freq: Two times a day (BID) | ORAL | Status: DC
  Administered 2015-12-01 – 2015-12-03 (×4): 25 mg via ORAL
  Filled 2015-12-01 (×4): qty 1

## 2015-12-01 MED ORDER — ACETAMINOPHEN 650 MG RE SUPP
650.0000 mg | Freq: Four times a day (QID) | RECTAL | Status: DC | PRN
  Administered 2015-12-01: 650 mg via RECTAL
  Filled 2015-12-01: qty 1

## 2015-12-01 NOTE — Progress Notes (Addendum)
[facility] PROGRESS NOTE  Stephanie [name] YQM:[id number] DOB: 1941-06-14 DOA: 11/29/2015 PCP: Stephanie Punt, MD   Stephanie [name] is a 75 year old chronically ill female with a past medical history of CVA, hypertension, hyperlipidemia, CKD stage 3, atrial fibrillation on Eliquis, lung cancer, s/p right hip replacement on 1/8; who presents from skilled nursing facility with complaints of rectal bleeding  Assessment/Plan: Lower GI bleed/Colitis:  - likely ischemic vs infectious - CT with thickening of descending colon.  - improved, no further bleeding - eliquis on hold, GI following, supportive care only, no plan for endoscopic workup due to overall poor prognosis - Hb stable, Abx stopped   Hypotension:  -due to above, improved -metoprolol was on hold, resume at lower dose today, titrate up as tol  Paroxysmal atrial fibrillation -  Eliquis on hold as above, Chadsvasc score 5.  - Continue amiodarone, resume metoprolol  H/o CVA, dysarthria, debility -eliquis on hold as above  Early stage Lung CA  AKI on CKD3 -  baseline creatinine of 0.95 , but 1.8 on admission -  Improved with IVF to 1.3, cut down IVF, stop tomorrow  Hyperlipidemia -Continue simvastatin  Anxiety -Continue Xanax /Klonopin per home regimen  Severe protein calorie malnutrition  Recent Hip fracture -s/p IM nail 11/08/15  ETHICS: Debility/SNF resident, with ongoing failure to thrive for months, cognitive issues, fluctuating mental status, very poor Po intake, very poor functional status, frequent hospitalization, severe protein calorie malnutrition, seen by palliative care last admission, discussed ongoing failure to thrive with daughter and recommended Palliative consult for goals of care, consideration of Hospice  DVT prophylaxis SCDs  Code Status: DNR Family Communication: brother at bedside, called and d/w daughter Stephanie [name] Disposition Plan: pending Palliative  consult   Consultants:  GI  HPI/Subjective: Feels ok, reports of no further bleeding  Objective: Filed Vitals:   11/30/15 2100 12/01/15 0451  BP: 123/57 144/49  Pulse: 67 66  Temp: 98.3 F (36.8 C) 97.6 F (36.4 C)  Resp: 20 20   No intake or output data in the 24 hours ending 12/01/15 1300 Filed Weights   11/29/15 2202  Weight: 62.9 kg (138 lb 10.7 oz)    Exam:   General: sleepy, just got some Iv dilaudid a little while back  Cardiovascular: S1S2/RRR  Respiratory: CTAB  Abdomen: soft, NT, BS present  Musculoskeletal: no edema c/c  Data Reviewed: Basic Metabolic Panel:  Recent Labs Lab 11/25/15 0735 11/27/15 0719 11/29/15 1840 12/01/15 0521  NA 136 137 138 140  K 4.8 4.0 4.8 4.2  CL 104 103 105 111  CO2 27 27  --  22  GLUCOSE 98 174* 99 100*  BUN 30* 14 36* 22*  CREATININE 1.58* 0.95 1.80* 1.34*  CALCIUM 8.7* 8.8*  --  8.5*   Liver Function Tests:  Recent Labs Lab 11/25/15 0735 11/29/15 1828 12/01/15 0521  AST 22 25 23   ALT 17 17 16   ALKPHOS 191* 184* 183*  BILITOT 0.7 1.1 0.6  PROT 6.6 6.4* 6.1*  ALBUMIN 2.6* 2.6* 2.5*   No results for input(s): LIPASE, AMYLASE in the last 168 hours. No results for input(s): AMMONIA in the last 168 hours. CBC:  Recent Labs Lab 11/25/15 0730 11/29/15 1828  11/29/15 2232 11/30/15 0249 11/30/15 0704 11/30/15 1027 12/01/15 0521  WBC 6.1 9.1  --   --   --   --   --  5.5  NEUTROABS  --  6.5  --   --   --   --   --   --  HGB 9.7* 9.3*  < > 10.4* 9.1* 9.5* 9.2* 9.0*  HCT 30.5* 29.1*  < > 33.7* 29.0* 29.2* 28.4* 28.3*  MCV 100.0 100.3*  --   --   --   --   --  100.4*  PLT 332 249  --   --   --   --   --  178  < > = values in this interval not displayed. Cardiac Enzymes: No results for input(s): CKTOTAL, CKMB, CKMBINDEX, TROPONINI in the last 168 hours. BNP (last 3 results) No results for input(s): BNP in the last 8760 hours.  ProBNP (last 3 results) No results for input(s): PROBNP in the last  8760 hours.  CBG: No results for input(s): GLUCAP in the last 168 hours.  Recent Results (from the past 240 hour(s))  MRSA PCR Screening     Status: None   Collection Time: 11/29/15 11:09 PM  Result Value Ref Range Status   MRSA by PCR NEGATIVE NEGATIVE Final    Comment:        The GeneXpert MRSA Assay (FDA approved for NASAL specimens only), is one component of a comprehensive MRSA colonization surveillance program. It is not intended to diagnose MRSA infection nor to guide or monitor treatment for MRSA infections.      Studies: Ct Abdomen Pelvis W Contrast  11/29/2015  CLINICAL DATA:  Dark red blood in adult diaper today. On 11/06/2015, with IM nail placed on 11/08/2015. History of lung cancer and hypertension. EXAM: CT ABDOMEN AND PELVIS WITH CONTRAST TECHNIQUE: Multidetector CT imaging of the abdomen and pelvis was performed using the standard protocol following bolus administration of intravenous contrast. CONTRAST:  OMNIPAQUE IOHEXOL 300 MG/ML  SOLN COMPARISON:  Multiple exams, including 04/11/2015 FINDINGS: Lower chest: Interstitial and airspace opacities in both lower lobes into a lesser degree in the right middle lobe. Coronary atherosclerosis and mild cardiomegaly. Mitral annular calcification. Hepatobiliary: Mildly prominent caudate lobe and lateral segment, possibly an early morphologic finding of cirrhosis. Dependent density in the gallbladder may reflect sludge or small gallstones. Mildly distended gallbladder. No biliary dilatation. No significant abnormal focal hepatic lesion. Pancreas: Unremarkable Spleen: Unremarkable Adrenals/Urinary Tract: Mild bilateral renal atrophy. Bilateral peripelvic cysts. Several small hypodense lesions of the right kidney are statistically likely to represent cysts although it is technically nonspecific due to small size. In addition, there is a 1.6 cm right mid kidney region of hypoenhancement on urographic phase images. There may be some  overlying scarring in this vicinity on portal venous phase images. No findings of leak from the urinary bladder. Stomach/Bowel: Sigmoid colon diverticulosis. Wall thickening in the descending colon appears abnormal. Mild pericolic stranding in the descending colon. Vascular/Lymphatic: Aortoiliac atherosclerotic vascular disease. 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 This has not changed appreciably from 04/11/2015. The celiac trunk, SMA, and IMA appear patent. Reproductive: Uterus absent. Left ovary unremarkable, probable small right ovarian remnant likewise unremarkable. Other: If No supplemental non-categorized findings. Musculoskeletal: Right sacral alar fracture again noted. Fractures of the right superior pubic ramus, right pubic body, and right inferior pubic ramus show early healing response. Comminuted left proximal femoral fracture involving the femoral neck and intertrochanteric region observe for thigh and nail in place, no obvious complicating feature. There still some hematoma and edema in the vicinity of the fractures. Anterior wedging at L1 is likely chronic. There is mild left foraminal stenosis at L4-5 as well as moderate left and prominent right foraminal stenosis at L5-S1 due to  disc uncovering, disc bulge, facet arthropathy, and right foraminal disc protrusion at L5-S1. IMPRESSION: 1. There is abnormal wall thickening in the descending colon with surrounding stranding. I am suspicious for infectious colitis. This could be a cause for rectal bleeding. 2. On urographic phase images there is indistinct hypodensity in the right mid kidney. This is technically nonspecific but could reflect a small region of pyelonephritis. Correlate with urine analysis. 3. Healing fractures of the right pelvis. Right hip IM nail in place traversing comminuted fracture without complicating feature observed. 4. Bibasilar interstitial and airspace opacities, possibly from edema or less  likely pneumonia. 5. Other imaging findings of potential clinical significance: Coronary atherosclerosis and mild cardiomegaly. Mitral annular calcification. Suspected early morphologic signs of hepatic cirrhosis. Possible small layering gallstones in the gallbladder. The gallbladder is mildly distended but not thick-walled. Mild bilateral renal atrophy. Bilateral peripelvic cysts. Small right renal cysts. Sigmoid colon diverticulosis. Aortoiliac atherosclerotic vascular disease. Foraminal impingement at L4-5 and L5-S1. Electronically Signed   By: Gaylyn Rong M.D.   On: 11/29/2015 19:26   Dg Chest Port 1 View  11/29/2015  CLINICAL DATA:  Shortness of breath. EXAM: PORTABLE CHEST 1 VIEW COMPARISON:  11/04/2015 FINDINGS: Increase in diffuse lung opacity. There is a background of low volumes and interstitial lung disease. Chronic cardiomegaly, distorted by leftward rotation and left-sided volume loss. No gross effusion. No pneumothorax. IMPRESSION: 1. Lung opacity above baseline with diffuse appearance suggesting edema. 2. Background interstitial lung disease with fibrosis. Electronically Signed   By: Marnee Spring M.D.   On: 11/29/2015 22:57    Scheduled Meds: . amiodarone  400 mg Oral BID  . clonazePAM  0.5 mg Oral BID  . DULoxetine  60 mg Oral Daily  . feeding supplement (ENSURE ENLIVE)  237 mL Oral Q24H  . pantoprazole  40 mg Oral Daily  . simvastatin  20 mg Oral Daily  . sodium chloride flush  3 mL Intravenous Q12H   Continuous Infusions: . sodium chloride 75 mL/hr at 11/30/15 2326   Antibiotics Given (last 72 hours)    Date/Time Action Medication Dose Rate   11/29/15 2224 Given   metroNIDAZOLE (FLAGYL) IVPB 500 mg 500 mg 100 mL/hr      Principal Problem:   GI bleed Active Problems:   Anemia   Hypotension   Rectal bleeding   Paroxysmal atrial fibrillation (HCC)   Colitis   Acute kidney injury superimposed on chronic kidney disease (HCC)  Time spent:   [facility]  [facility] Pager [phone]. If 7PM-7AM, please contact night-coverage at [email or web address], password [facility] 12/01/2015, 1:00 PM  LOS: 2 days

## 2015-12-01 NOTE — Plan of Care (Signed)
Call to daughter Sissy.  We plan to meet tomorrow at 3 pm.  Sissy shares that she "doesn't want her to suffer".  That her mother has been a resident of [facility] for over a year, and that she is no longer able to feed herself (refused outside food from [facility] yesterday), and is incontinent of bowel and bladder.  "She told me she didn't want to be like this".  We talk about comfort care, medications continued for symptom or HR, but no testing or further treatments. We discuss the concepts of do not treat the next infection, do not re hospitalize.  She asks, "Am I wrong?"  Sissy states sister [name] is returning from [place] today and they will meet at hospital tonight.

## 2015-12-01 NOTE — Progress Notes (Signed)
REVIEWED-NO ADDITIONAL RECOMMENDATIONS.-SLF   Subjective: Moaning today in bed. No bleeding. No diarrhea per brother.   Objective: Vital signs in last 24 hours: Temp:  [97.6 F (36.4 C)-98.3 F (36.8 C)] 97.6 F (36.4 C) (01/31 0451) Pulse Rate:  [54-67] 66 (01/31 0451) Resp:  [18-20] 20 (01/31 0451) BP: (111-144)/(49-57) 144/49 mmHg (01/31 0451) SpO2:  [97 %-100 %] 98 % (01/31 0451) Last BM Date: 12/01/15 General:   Moaning in bed. Appears uncomfortable.  Head:  Normocephalic and atraumatic. Abdomen:  Bowel sounds present, soft, non-tender, non-distended.  Extremities:  Right leg contractured-like  Neurologic: opens eyes to verbal stimuli but unable to assess orientation  Psych:  Alert and cooperative. Normal mood and affect.  Intake/Output from previous day:   Intake/Output this shift:    Lab Results:  Recent Labs  11/29/15 1828  11/30/15 0704 11/30/15 1027 12/01/15 0521  WBC 9.1  --   --   --  5.5  HGB 9.3*  < > 9.5* 9.2* 9.0*  HCT 29.1*  < > 29.2* 28.4* 28.3*  PLT 249  --   --   --  178  < > = values in this interval not displayed. BMET  Recent Labs  11/29/15 1840 12/01/15 0521  NA 138 140  K 4.8 4.2  CL 105 111  CO2  --  22  GLUCOSE 99 100*  BUN 36* 22*  CREATININE 1.80* 1.34*  CALCIUM  --  8.5*   LFT  Recent Labs  11/29/15 1828 12/01/15 0521  PROT 6.4* 6.1*  ALBUMIN 2.6* 2.5*  AST 25 23  ALT 17 16  ALKPHOS 184* 183*  BILITOT 1.1 0.6  BILIDIR 0.2 0.2  IBILI 0.9 0.4     Studies/Results: Ct Abdomen Pelvis W Contrast  11/29/2015  CLINICAL DATA:  Dark red blood in adult diaper today. On 11/06/2015, with IM nail placed on 11/08/2015. History of lung cancer and hypertension. EXAM: CT ABDOMEN AND PELVIS WITH CONTRAST TECHNIQUE: Multidetector CT imaging of the abdomen and pelvis was performed using the standard protocol following bolus administration of intravenous contrast. CONTRAST:  100mL OMNIPAQUE IOHEXOL 300 MG/ML  SOLN COMPARISON:   Multiple exams, including 04/11/2015 FINDINGS: Lower chest: Interstitial and airspace opacities in both lower lobes into a lesser degree in the right middle lobe. Coronary atherosclerosis and mild cardiomegaly. Mitral annular calcification. Hepatobiliary: Mildly prominent caudate lobe and lateral segment, possibly an early morphologic finding of cirrhosis. Dependent density in the gallbladder may reflect sludge or small gallstones. Mildly distended gallbladder. No biliary dilatation. No significant abnormal focal hepatic lesion. Pancreas: Unremarkable Spleen: Unremarkable Adrenals/Urinary Tract: Mild bilateral renal atrophy. Bilateral peripelvic cysts. Several small hypodense lesions of the right kidney are statistically likely to represent cysts although it is technically nonspecific due to small size. In addition, there is a 1.6 cm right mid kidney region of hypoenhancement on urographic phase images. There may be some overlying scarring in this vicinity on portal venous phase images. No findings of leak from the urinary bladder. Stomach/Bowel: Sigmoid colon diverticulosis. Wall thickening in the descending colon appears abnormal. Mild pericolic stranding in the descending colon. Vascular/Lymphatic: Aortoiliac atherosclerotic vascular disease. 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 This has not changed appreciably from 04/11/2015. The celiac trunk, SMA, and IMA appear patent. Reproductive: Uterus absent. Left ovary unremarkable, probable small right ovarian remnant likewise unremarkable. Other: If No supplemental non-categorized findings. Musculoskeletal: Right sacral alar fracture again noted. Fractures of the right superior pubic ramus, right pubic   body, and right inferior pubic ramus show early healing response. Comminuted left proximal femoral fracture involving the femoral neck and intertrochanteric region observe for thigh and nail in place, no obvious complicating feature.  There still some hematoma and edema in the vicinity of the fractures. Anterior wedging at L1 is likely chronic. There is mild left foraminal stenosis at L4-5 as well as moderate left and prominent right foraminal stenosis at L5-S1 due to disc uncovering, disc bulge, facet arthropathy, and right foraminal disc protrusion at L5-S1. IMPRESSION: 1. There is abnormal wall thickening in the descending colon with surrounding stranding. I am suspicious for infectious colitis. This could be a cause for rectal bleeding. 2. On urographic phase images there is indistinct hypodensity in the right mid kidney. This is technically nonspecific but could reflect a small region of pyelonephritis. Correlate with urine analysis. 3. Healing fractures of the right pelvis. Right hip IM nail in place traversing comminuted fracture without complicating feature observed. 4. Bibasilar interstitial and airspace opacities, possibly from edema or less likely pneumonia. 5. Other imaging findings of potential clinical significance: Coronary atherosclerosis and mild cardiomegaly. Mitral annular calcification. Suspected early morphologic signs of hepatic cirrhosis. Possible small layering gallstones in the gallbladder. The gallbladder is mildly distended but not thick-walled. Mild bilateral renal atrophy. Bilateral peripelvic cysts. Small right renal cysts. Sigmoid colon diverticulosis. Aortoiliac atherosclerotic vascular disease. Foraminal impingement at L4-5 and L5-S1. Electronically Signed   By: Van Clines M.D.   On: 11/29/2015 19:26   Dg Chest Port 1 View  11/29/2015  CLINICAL DATA:  Shortness of breath. EXAM: PORTABLE CHEST 1 VIEW COMPARISON:  11/04/2015 FINDINGS: Increase in diffuse lung opacity. There is a background of low volumes and interstitial lung disease. Chronic cardiomegaly, distorted by leftward rotation and left-sided volume loss. No gross effusion. No pneumothorax. IMPRESSION: 1. Lung opacity above baseline with diffuse  appearance suggesting edema. 2. Background interstitial lung disease with fibrosis. Electronically Signed   By: Monte Fantasia M.D.   On: 11/29/2015 22:57    Assessment: 75 year old female with an extensive medical history, presenting with rectal bleeding in the setting of Eliquis, which is on hold. Does not appear to be hemodynamically significant, with Hgb remaining around baseline for patient. NO further rectal bleeding. Reports of loose stool per brother at bedside, although this is not documented in epic and also appears to have resolved.  CT with abnormal wall thickening in descending colon, with concern for infectious colitis. Stool studies have not been obtained thus far. Differentials broad at this point and unable to rule out infectious colitis, ischemic colitis. As patient is unable to give history, it is unclear if she has had any other concerning GI symptoms. No prior colonoscopy per brother.   At this point, no colonoscopy indicated while inpatient unless hemodynamically significant rectal bleeding or no improvement in symptoms. Would offer supportive measures, check stool studies if any further diarrhea, and monitor H/H serially. Agree with palliative care consult. GI options limited at this point due to patient's multiple comorbidities. Hold on empiric antibiotics.   Mildly prominent liver: could consider elastography in the future to exclude underlying cirrhosis. Non-specific mildly elevated alk phos. Continue to follow.   Plan: PPI once daily Stool studies if any further diarrhea Eliquis on hold Agree with palliative care consultation Consider elastography in future as outpatient if she clinically improves Follow LFTs Will follow peripherally  Orvil Feil, ANP-BC [facility]    LOS: 2 days    12/01/2015, 8:26 AM

## 2015-12-01 NOTE — Progress Notes (Addendum)
Patient ID: Stephanie [name], female   DOB: 02-Sep-1941, 75 y.o.   MRN: [id number]                PROGRESS NOTE  DATE:  11/28/2015         FACILITY: [facility]                 LEVEL OF CARE:   SNF   Acute Visit             CHIEF COMPLAINT:  Restless, agitated behavior at night/pseudobulbar affect.      HISTORY OF PRESENT ILLNESS:  This is a patient who has a history of a dominant hemisphere stroke at some time in the past before her initial arrival here.  She had initially come to [place] in late 2015 with heart failure.  She became a long-term patient here.    She was recently in the hospital, having suffered a right hip fracture.  She underwent an ORIF.    She developed new-onset atrial fibrillation in the hospital, requiring amiodarone and metoprolol.  Because of this, she was discharged on Eliquis.    Since her return here, she has been really very emotionally upset.  Apparently, she is up all night with a combination of yelling and crying.  I had given her 0.5 of Klonopin last week.  This has not been effective.  She had a pure Klonopin that was stopped because of concerns of excess sedation.    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  Unfortunately, I do not have a great sense of what she was like before this, although I think all of this is not new but it is just worse.    PAST MEDICAL HISTORY/PROBLEM LIST:   Past Medical History  Diagnosis Date  . CVA (cerebral infarction)     speech difficulities  . Stroke ([place])   . Hypercholesteremia     Takes Zocor daily  . Hypertension     takes Atenolol and Lisinopril daily  . Lung mass     left upper lobe  . Arthritis     hands  . TIA (transient ischemic attack)     Sat before thanksgiving;impaired speech  .  Pneumothorax   . Lung cancer ([place])   . CKD (chronic kidney disease), stage III   . Fracture, hip ([place])   . CHF (congestive heart failure) ([place])   . Muscle weakness (generalized)   . Dysphagia   . COPD (chronic obstructive pulmonary disease) ([place])   . Falls   . Abnormal posture       PAST SURGICAL HISTORY:    Past Surgical History  Procedure Laterality Date  . Lung biopsy  10/13/11    left  . Abdominal hysterectomy  1975  . Varicose vein surgery  89yr ago  . Video assisted thoracoscopy  11/18/2011    Procedure: VIDEO ASSISTED THORACOSCOPY;  Surgeon: SMelrose Nakayama MD;  [place]  Service: Thoracic;  Laterality: Left;  . Lobectomy  11/18/2011    Procedure: LOBECTOMY;  Surgeon: SMelrose Nakayama MD;  [place] [facility]  Service: Thoracic;  Laterality: Left;  LEFT UPPER LOBECTOMY  . Breast biopsy  early 2000  . Intramedullary (im) nail intertrochanteric Right 11/08/2015    Procedure: INTRAMEDULLARY (  IM) NAIL INTERTROCHANTERIC;  Surgeon: Rod Can, MD;  Location: [facility] [place];  Service: Orthopedics;  Laterality: Right;       REVIEW OF SYSTEMS:  Really not possible.    PHYSICAL EXAMINATION:   GENERAL APPEARANCE:  The patient is actually lethargic at 11:30 in the morning.   CHEST/RESPIRATORY:  Exam is clear.      CARDIOVASCULAR:   CARDIAC:  Heart sounds are soft.  She has a grade 3/6 pansystolic murmur at the lower left sternal border and PMI.  This is compatible with her known severe MR.  She also has moderate tricuspid regurgitation.  Her JVP is not elevated.  She appears to be euvolemic.      GASTROINTESTINAL:   ABDOMEN:  Soft.  No masses.     LIVER/SPLEEN/KIDNEYS:  No liver, no spleen.  No tenderness.     NEUROLOGICAL:   I could not really test this as the patient really would not cooperate.      ASSESSMENT/PLAN:                   Dementia with behavior disturbance.   How severe the dementia is, it is really difficult to know.  In fact, all of this might just be  cortical language disturbance rather than dementia.  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  I would like to try Nuedexta.  However, the quinidine has a drug interaction with the amiodarone.  The recommendation is to monitor the QT interval.  I would need to discuss this with the family and also the facility on how we would possibly do this.  Interestingly, her last MRI earlier this month only showed moderate changes of chronic small vessel disease with no large vessel territory infarction.  Nevertheless, her neurologic findings are what they are.    Late-effect microvascular ischemic disease, as described above.  I also reviewed her MRI from 2012.  This showed acute nonhemorrhagic small infarcts throughout the left lenticular nucleus into the left corona radiata.  A remote right caudate infarct, moderate small vessel disease.  I think this more than likely explains her pseudobulbar affect.   Whether she has coexistent depression on top of this is virtually impossible to tell.  She is on Doxepin 3 mg at bedtime and Cymbalta 60 mg a day, as well as Lamictal, I think for her mood disorder.

## 2015-12-01 NOTE — Progress Notes (Signed)
Patient oriented to self only, moans out loudly, does not follow commands, pt has pulled off telemetry leads at least 10 x this shift, will notify MD.

## 2015-12-02 DIAGNOSIS — Z515 Encounter for palliative care: Secondary | ICD-10-CM

## 2015-12-02 DIAGNOSIS — R627 Adult failure to thrive: Secondary | ICD-10-CM

## 2015-12-02 DIAGNOSIS — Z7189 Other specified counseling: Secondary | ICD-10-CM

## 2015-12-02 MED ORDER — MORPHINE SULFATE (PF) 2 MG/ML IV SOLN
2.0000 mg | INTRAVENOUS | Status: DC | PRN
  Administered 2015-12-02 – 2015-12-03 (×7): 2 mg via INTRAVENOUS
  Filled 2015-12-02 (×7): qty 1

## 2015-12-02 MED ORDER — LORAZEPAM 2 MG/ML IJ SOLN
2.0000 mg | INTRAMUSCULAR | Status: DC | PRN
  Administered 2015-12-02 – 2015-12-03 (×5): 2 mg via INTRAVENOUS
  Filled 2015-12-02 (×5): qty 1

## 2015-12-02 MED ORDER — LORAZEPAM 2 MG/ML IJ SOLN
1.0000 mg | INTRAMUSCULAR | Status: DC | PRN
  Administered 2015-12-02: 1 mg via INTRAVENOUS
  Filled 2015-12-02: qty 1

## 2015-12-02 MED ORDER — OLANZAPINE 5 MG PO TBDP
5.0000 mg | ORAL_TABLET | Freq: Every day | ORAL | Status: DC
  Administered 2015-12-02: 5 mg via ORAL
  Filled 2015-12-02 (×2): qty 1

## 2015-12-02 NOTE — Progress Notes (Signed)
[facility] PROGRESS NOTE  Stephanie [name] FUX:[id number] DOB: 16-Sep-1941 DOA: 11/29/2015 PCP: [name], MD  Assessment/Plan: 1. Probable lower GI bleed -Patient with history of atrial fibrillation have been on chronic anticoagulation presented with bloody stools. CT scan of abdomen and pelvis revealed findings consistent with colitis. -Suspect that anticoagulation likely precipitated GI bleed. -Patient seen and evaluated by GI. -Continue supportive care.  2.  History of paroxysmal atrial fibrillation -CHADSVasc score of 5 -Anticoagulation was discontinued due to GI bleed. -Ventricular rates in the 60s to 80s -Continue metoprolol 25 mg twice a day and amiodarone 400 mg by mouth twice a day if Stephanie [name] is able to take by mouth.  3.  Acute kidney injury -Likely secondary to minimal by mouth intake as patient has had a steep decline in function in the days prior to this hospitalization -Labs on 11/29/2015 revealed a creatinine of 1.8, trended down to 1.34 on 12/01/2015 after IV fluids were given  4.  Goals of care -I spoke with her 2 daughters Sissy and [name] regarding Stephanie [name]'s medical goals of care. They expressed their concerns for Stephanie [name]'s quality-of-life and wanting to focus her care on comfort and management of symptoms. They agreed with not pursuing further invasive/burdensome treatments which would not likely change endpoint. They are interested in further discussion of end of life care and transition to full comfort. They have a palliative care meeting set up today at 3 PM. I increased her morphine from 1 mg IV to 2 mg IV q 2 hours prn asa Stephanie [name] appeared to be in discomfort during my evaluation with moaning. Stephanie [name] is not taking PO as well and will change her benzodiazepine to IV Ativan. Await further recommendations from Palliative Care  Code Status: DNR Family Communication: I spoke with Sissy and [place] Disposition Plan: Plan for Palliative Care Meeting today at 3  PM   Consultants:  Palliative Care  GI   HPI/Subjective: Stephanie [name] is a 75 year old female with multiple comorbidities including history of CVA, atrial fibrillation advanced on chronic anticoagulation, chronic kidney disease, history of lung cancer, recent hip replacement on 11/08/2015, having a decline over the past several months, admitted to the medicine service on 11/29/2015 when Stephanie [name] presented with rectal bleeding. Initial workup included a CT scan of abdomen and pelvis that showed evidence of colitis. He was started on empiric IV antibiotic therapy with ciprofloxacin and Flagyl. Family members expressing concerns for her quality of life, stating that 'Stephanie [name] would not have wanted to live like this." Palliative care was consulted.   Objective: Filed Vitals:   12/01/15 2231 12/02/15 0633  BP: 132/76 130/53  Pulse: 81   Temp: 98.4 F (36.9 C) 98.6 F (37 C)  Resp: 20 18    Intake/Output Summary (Last 24 hours) at 12/02/15 1003 Last data filed at 12/02/15 0900  Gross per 24 hour  Intake 3227.91 ml  Output      0 ml  Net 3227.91 ml   Filed Weights   11/29/15 2202  Weight: 62.9 kg (138 lb 10.7 oz)    Exam:   General:  Chronically ill appearing, nonverbal, not following commands, moaning, appears uncomfortable  Cardiovascular: 2/6 SEM, RRR  Respiratory: Lungs appeared CTA, poor inspiratory effort, pt not following commands  Abdomen: Soft, mild generalized tenderness to palpation  Musculoskeletal: No edema  Data Reviewed: Basic Metabolic Panel:  Recent Labs Lab 11/27/15 0719 11/29/15 1840 12/01/15 0521  NA 137 138 140  K 4.0 4.8 4.2  CL 103 105  111  CO2 27  --  22  GLUCOSE 174* 99 100*  BUN 14 36* 22*  CREATININE 0.95 1.80* 1.34*  CALCIUM 8.8*  --  8.5*   Liver Function Tests:  Recent Labs Lab 11/29/15 1828 12/01/15 0521  AST 25 23  ALT 17 16  ALKPHOS 184* 183*  BILITOT 1.1 0.6  PROT 6.4* 6.1*  ALBUMIN 2.6* 2.5*   No results for input(s):  LIPASE, AMYLASE in the last 168 hours. No results for input(s): AMMONIA in the last 168 hours. CBC:  Recent Labs Lab 11/29/15 1828  11/29/15 2232 11/30/15 0249 11/30/15 0704 11/30/15 1027 12/01/15 0521  WBC 9.1  --   --   --   --   --  5.5  NEUTROABS 6.5  --   --   --   --   --   --   HGB 9.3*  < > 10.4* 9.1* 9.5* 9.2* 9.0*  HCT 29.1*  < > 33.7* 29.0* 29.2* 28.4* 28.3*  MCV 100.3*  --   --   --   --   --  100.4*  PLT 249  --   --   --   --   --  178  < > = values in this interval not displayed. Cardiac Enzymes: No results for input(s): CKTOTAL, CKMB, CKMBINDEX, TROPONINI in the last 168 hours. BNP (last 3 results) No results for input(s): BNP in the last 8760 hours.  ProBNP (last 3 results) No results for input(s): PROBNP in the last 8760 hours.  CBG: No results for input(s): GLUCAP in the last 168 hours.  Recent Results (from the past 240 hour(s))  MRSA PCR Screening     Status: None   Collection Time: 11/29/15 11:09 PM  Result Value Ref Range Status   MRSA by PCR NEGATIVE NEGATIVE Final    Comment:        The GeneXpert MRSA Assay (FDA approved for NASAL specimens only), is one component of a comprehensive MRSA colonization surveillance program. It is not intended to diagnose MRSA infection nor to guide or monitor treatment for MRSA infections.      Studies: No results found.  Scheduled Meds: . amiodarone  400 mg Oral BID  . DULoxetine  60 mg Oral Daily  . feeding supplement (ENSURE ENLIVE)  237 mL Oral Q24H  . metoprolol tartrate  25 mg Oral BID  . sodium chloride flush  3 mL Intravenous Q12H   Continuous Infusions: . sodium chloride 50 mL/hr at 12/02/15 0600    Principal Problem:   GI bleed Active Problems:   Anemia   Hypotension   Rectal bleeding   Paroxysmal atrial fibrillation (HCC)   Colitis   Acute kidney injury superimposed on chronic kidney disease ([place])    Time spent: 35 min, spent 25 min in family discussions regarding her medical  goals of care    [place], [facility] Pager [phone]. If 7PM-7AM, please contact night-coverage at [email or web address], password [facility] 12/02/2015, 10:03 AM  LOS: 3 days

## 2015-12-02 NOTE — Progress Notes (Signed)
Daily Progress Note   Patient Name: Stephanie [name]       Date: 12/02/2015 DOB: 1941/06/22  Age: 75 y.o. MRN#: [id number] Attending Physician: Kelvin Cellar, MD Primary Care Physician: [name], MD Admit Date: 11/29/2015  Reason for Consultation/Follow-up: Establishing goals of care, Interfamily conflict, Psychosocial/spiritual support and Terminal Care  Subjective: Mrs. Grable is resting in bed with her family at her side. We go to my office to meet.  Daughter [name], brother Konrad Dolores and [place]. Family shares the decline over the last 2 years, 1 year and since surgery.  They feel that she should be made comfortable, but still want some treatments. Daughter [name], states she is unable to stop treating at this time.  We discuss her decreased PO intake, and likelihood of dehydration.   Daughter Sissy states "I don't want her to suffer any more", and that she would not want to live in a diaper and bedbound. Most of the family agrees but [name] makes no response.  Family decides to return to [facility] with Hospice and make choices from there.  We discuss the concepts of do not re hospitalize, do not treat the next infection.  Also, the benefits of Hospice home.   I will follow up with this family tomorrow.    Length of Stay: 3 days  Current Medications: Scheduled Meds:  . amiodarone  400 mg Oral BID  . DULoxetine  60 mg Oral Daily  . feeding supplement (ENSURE ENLIVE)  237 mL Oral Q24H  . metoprolol tartrate  25 mg Oral BID  . OLANZapine zydis  5 mg Oral QHS  . sodium chloride flush  3 mL Intravenous Q12H    Continuous Infusions: . sodium chloride 50 mL/hr at 12/02/15 0600    PRN Meds: LORazepam, morphine injection, ondansetron **OR** ondansetron (ZOFRAN) IV, traMADol  Physical  Exam: Physical Exam  Pulmonary/Chest: Effort normal.  Abdominal: Soft.  Skin: Skin is warm and dry.  Nursing note and vitals reviewed.               Vital Signs: BP 135/74 mmHg  Pulse 78  Temp(Src) 98.5 F (36.9 C) (Oral)  Resp 18  Ht '5\' 5"'$  (1.651 m)  Wt 62.9 kg (138 lb 10.7 oz)  BMI 23.08 kg/m2  SpO2 97% SpO2: SpO2: 97 % O2  Device: O2 Device: Not Delivered O2 Flow Rate: O2 Flow Rate (L/min): 2 L/min  Intake/output summary:  Intake/Output Summary (Last 24 hours) at 12/02/15 1656 Last data filed at 12/02/15 0900  Gross per 24 hour  Intake 3227.91 ml  Output      0 ml  Net 3227.91 ml   LBM: Last BM Date: 12/01/15 Baseline Weight: Weight: 62.9 kg (138 lb 10.7 oz) Most recent weight: Weight: 62.9 kg (138 lb 10.7 oz)       Palliative Assessment/Data: Flowsheet Rows        Most Recent Value   Intake Tab    Referral Department  Hospitalist   Unit at Time of Referral  Med/Surg Unit   Palliative Care Primary Diagnosis  Cardiac   Date Notified  12/01/15   Palliative Care Type  Return patient Palliative Care   Reason for referral  Clarify Goals of Care, Psychosocial or Spiritual support, End of Life Care Assistance   Date of Admission  11/29/15   Date first seen by Palliative Care  12/02/15   # of days Palliative referral response time  1 Day(s)   # of days IP prior to Palliative referral  2   Clinical Assessment    Palliative Performance Scale Score  20%   Pain Max last 24 hours  Not able to report   Pain Min Last 24 hours  Not able to report   Dyspnea Max Last 24 Hours  Not able to report   Dyspnea Min Last 24 hours  Not able to report   Psychosocial & Spiritual Assessment    Social Work Plan of Care  Staff support   Palliative Care Outcomes    Patient/Family meeting held?  Yes   Who was at the meeting?  daughter [name], brother Konrad Dolores and neice Scott  Provided end of life care assistance, Provided psychosocial or spiritual support,  Counseled regarding hospice, Clarified goals of care   Patient/Family wishes: Interventions discontinued/not started   Mechanical Ventilation   Palliative Care follow-up planned  Yes, Facility      Additional Data Reviewed: CBC    Component Value Date/Time   WBC 5.5 12/01/2015 0521   RBC 2.82* 12/01/2015 0521   HGB 9.0* 12/01/2015 0521   HCT 28.3* 12/01/2015 0521   PLT 178 12/01/2015 0521   MCV 100.4* 12/01/2015 0521   MCH 31.9 12/01/2015 0521   MCHC 31.8 12/01/2015 0521   RDW 15.6* 12/01/2015 0521   LYMPHSABS 1.1 11/29/2015 1828   MONOABS 1.1* 11/29/2015 1828   EOSABS 0.4 11/29/2015 1828   BASOSABS 0.0 11/29/2015 1828    CMP     Component Value Date/Time   NA 140 12/01/2015 0521   K 4.2 12/01/2015 0521   CL 111 12/01/2015 0521   CO2 22 12/01/2015 0521   GLUCOSE 100* 12/01/2015 0521   BUN 22* 12/01/2015 0521   CREATININE 1.34* 12/01/2015 0521   CALCIUM 8.5* 12/01/2015 0521   PROT 6.1* 12/01/2015 0521   ALBUMIN 2.5* 12/01/2015 0521   AST 23 12/01/2015 0521   ALT 16 12/01/2015 0521   ALKPHOS 183* 12/01/2015 0521   BILITOT 0.6 12/01/2015 0521   GFRNONAA 38* 12/01/2015 0521   GFRAA 44* 12/01/2015 0521       Problem List:  Patient Active Problem List   Diagnosis Date Noted  . Rectal bleeding 11/29/2015  . Paroxysmal atrial fibrillation ([place]) 11/29/2015  . Colitis 11/29/2015  . Acute kidney injury superimposed on chronic kidney  disease ([place]) 11/29/2015  . GI bleed 11/29/2015  . Malnutrition of moderate degree 11/12/2015  . DNR (do not resuscitate) 11/12/2015  . Palliative care encounter 11/12/2015  . Postoperative anemia due to acute blood loss 11/09/2015  . Fracture, intertrochanteric, right femur ([place]) 11/08/2015  . right superior and inferior pubic rami 11/07/2015  . Closed right hip fracture ([place])   . Atrial fibrillation ([place])   . Hip fracture, right ([place]) 11/04/2015  . CKD (chronic kidney disease), stage III 11/04/2015  . Sacral fracture, closed ([place])  11/04/2015  . Chest pain 10/31/2015  . TIA (transient ischemic attack) 10/22/2015  . Dysphagia 10/22/2015  . Rash and nonspecific skin eruption 09/22/2015  . Cellulitis 07/31/2015  . Low back pain 07/22/2015  . Back pain 04/14/2015  . UTI (urinary tract infection) 04/14/2015  . History of CVA (cerebrovascular accident) 04/14/2015  . Mental status change 03/16/2015  . Hypotension 03/16/2015  . Acute encephalopathy 03/16/2015  . Encephalopathy acute 03/16/2015  . Altered mental status   . Arterial hypotension   . Cough 11/10/2014  . Anemia 11/10/2014  . Dyspnea 10/16/2014  . Acute on chronic renal failure ([place]) 10/16/2014  . Bronchitis 10/15/2014  . Diastolic CHF, acute ([place]) 10/15/2014  . Hyperlipidemia 05/20/2014  . Lack of coordination 02/19/2013  . Muscle weakness (generalized) 02/19/2013  . Dysarthria as late effect of stroke 02/04/2013  . Bilateral carotid artery disease ([place]) 02/04/2013  . Other and unspecified hyperlipidemia 02/04/2013  . Adenocarcinoma of left lung, stage 1 ([place]) 10/19/2011  . Pneumothorax of left lung after biopsy 10/14/2011    Class: Acute  . CVA (cerebral infarction) 09/17/2011  . HTN (hypertension) 09/17/2011     Palliative Care Assessment & Plan    1.Code Status:  DNR we discuss the concepts of allow a natural death, do not treat the next infection.     Code Status Orders        Start     Ordered   11/29/15 2208  Do not attempt resuscitation (DNR)   Continuous    Question Answer Comment  In the event of cardiac or respiratory ARREST Do not call a "code blue"   In the event of cardiac or respiratory ARREST Do not perform Intubation, CPR, defibrillation or ACLS   In the event of cardiac or respiratory ARREST Use medication by any route, position, wound care, and other measures to relive pain and suffering. May use oxygen, suction and manual treatment of airway obstruction as needed for comfort.      11/29/15 2207    Code Status  History    Date Active Date Inactive Code Status Order ID Comments User Context   11/08/2015  2:06 PM 11/17/2015  8:28 PM DNR [id number]  Rod Can, MD Inpatient   11/05/2015 12:55 AM 11/08/2015  2:06 PM DNR [id number]  Orvan Falconer, MD Inpatient   11/04/2015 10:44 PM 11/05/2015 12:55 AM DNR [id number]  Orvan Falconer, MD ED   10/23/2015  7:27 AM 10/23/2015  4:53 PM DNR [id number]  Orvan Falconer, MD Inpatient   10/22/2015  4:04 PM 10/23/2015  7:27 AM Full Code [id number]  Orvan Falconer, MD Inpatient   10/22/2015  2:48 PM 10/22/2015  4:04 PM DNR [id number]  Orvan Falconer, MD ED   03/16/2015  4:18 PM 03/17/2015  3:54 PM DNR [id number]  Radene Gunning, NP ED   10/15/2014 11:43 PM 10/17/2014  9:00 PM DNR [id number]  Oswald Hillock, MD Inpatient   11/18/2011  5:55 PM 11/23/2011  2:17  PM Full Code [id number]  Connye Burkitt, RN Inpatient   10/14/2011  9:21 PM 10/19/2011 12:55 PM Full Code [id number]  Beau Fanny, RN Inpatient    Advance Directive Documentation        Most Recent Value   Type of Advance Directive  Out of facility DNR (pink MOST or yellow form)   Pre-existing out of facility DNR order (yellow form or pink MOST form)  Physician notified to receive inpatient order, Yellow form placed in chart (order not valid for inpatient use)   "MOST" Form in Place?         2. Goals of Care/Additional Recommendations:  Return to [facility] with Hospice.   Limitations on Scope of Treatment: we are discussing the concepts of do not treat the next infection, do not rehopitalize.  Most family is in agreement, but daughter [name] states she can not do this.   Desire for further Chaplaincy support:Not discussed today.   Psycho-social Needs: Education on Hospice  3. Symptom Management:      1.per Hospitalist.   4. Palliative Prophylaxis:   Aspiration and Turn Reposition  5. Prognosis: < 2 weeks, likely dt decreased intake.   6. Discharge Planning:  [place] with Hospice   Care plan was discussed with CM, SW and Dr.  Coralyn Pear on next rounds.   Thank you for allowing the Palliative Medicine Team to assist in the care of this patient.   Time In: 1515 Time Out: 1700 Total Time 105 minutes Prolonged Time Billed  yes         Drue Novel, NP  12/02/2015, 4:56 PM  Please contact Palliative Medicine Team phone at [phone] for questions and concerns.

## 2015-12-03 ENCOUNTER — Inpatient Hospital Stay (HOSPITAL_COMMUNITY)
Admission: RE | Admit: 2015-12-03 | Discharge: 2015-12-04 | DRG: 378 | Disposition: A | Discharge status: Hospice | Source: Hospice | Attending: Internal Medicine | Admitting: Internal Medicine

## 2015-12-03 ENCOUNTER — Encounter (HOSPITAL_COMMUNITY): Payer: Self-pay | Admitting: *Deleted

## 2015-12-03 DIAGNOSIS — Z85118 Personal history of other malignant neoplasm of bronchus and lung: Secondary | ICD-10-CM

## 2015-12-03 DIAGNOSIS — E785 Hyperlipidemia, unspecified: Secondary | ICD-10-CM | POA: Diagnosis present

## 2015-12-03 DIAGNOSIS — Z823 Family history of stroke: Secondary | ICD-10-CM

## 2015-12-03 DIAGNOSIS — J449 Chronic obstructive pulmonary disease, unspecified: Secondary | ICD-10-CM | POA: Diagnosis present

## 2015-12-03 DIAGNOSIS — Z809 Family history of malignant neoplasm, unspecified: Secondary | ICD-10-CM | POA: Diagnosis not present

## 2015-12-03 DIAGNOSIS — Z96641 Presence of right artificial hip joint: Secondary | ICD-10-CM | POA: Diagnosis present

## 2015-12-03 DIAGNOSIS — D649 Anemia, unspecified: Secondary | ICD-10-CM | POA: Diagnosis present

## 2015-12-03 DIAGNOSIS — Z8673 Personal history of transient ischemic attack (TIA), and cerebral infarction without residual deficits: Secondary | ICD-10-CM

## 2015-12-03 DIAGNOSIS — I480 Paroxysmal atrial fibrillation: Secondary | ICD-10-CM | POA: Diagnosis present

## 2015-12-03 DIAGNOSIS — K529 Noninfective gastroenteritis and colitis, unspecified: Secondary | ICD-10-CM | POA: Diagnosis present

## 2015-12-03 DIAGNOSIS — N179 Acute kidney failure, unspecified: Secondary | ICD-10-CM | POA: Diagnosis present

## 2015-12-03 DIAGNOSIS — Z833 Family history of diabetes mellitus: Secondary | ICD-10-CM

## 2015-12-03 DIAGNOSIS — Z7189 Other specified counseling: Secondary | ICD-10-CM | POA: Diagnosis not present

## 2015-12-03 DIAGNOSIS — Z902 Acquired absence of lung [part of]: Secondary | ICD-10-CM

## 2015-12-03 DIAGNOSIS — I129 Hypertensive chronic kidney disease with stage 1 through stage 4 chronic kidney disease, or unspecified chronic kidney disease: Secondary | ICD-10-CM | POA: Diagnosis present

## 2015-12-03 DIAGNOSIS — K625 Hemorrhage of anus and rectum: Principal | ICD-10-CM | POA: Diagnosis present

## 2015-12-03 DIAGNOSIS — I959 Hypotension, unspecified: Secondary | ICD-10-CM | POA: Diagnosis present

## 2015-12-03 DIAGNOSIS — Z66 Do not resuscitate: Secondary | ICD-10-CM | POA: Diagnosis present

## 2015-12-03 DIAGNOSIS — Z515 Encounter for palliative care: Secondary | ICD-10-CM | POA: Diagnosis present

## 2015-12-03 DIAGNOSIS — Z90710 Acquired absence of both cervix and uterus: Secondary | ICD-10-CM

## 2015-12-03 DIAGNOSIS — N183 Chronic kidney disease, stage 3 (moderate): Secondary | ICD-10-CM | POA: Diagnosis present

## 2015-12-03 DIAGNOSIS — E7800 Pure hypercholesterolemia, unspecified: Secondary | ICD-10-CM | POA: Diagnosis present

## 2015-12-03 DIAGNOSIS — Z7901 Long term (current) use of anticoagulants: Secondary | ICD-10-CM | POA: Diagnosis not present

## 2015-12-03 MED ORDER — MORPHINE SULFATE (CONCENTRATE) 10 MG/0.5ML PO SOLN: 2.5000 mg | ORAL | Status: AC | PRN

## 2015-12-03 MED ORDER — MORPHINE SULFATE (CONCENTRATE) 10 MG/0.5ML PO SOLN
2.5000 mg | ORAL | Status: DC
  Administered 2015-12-03: 2.6 mg via SUBLINGUAL

## 2015-12-03 MED ORDER — MORPHINE SULFATE (CONCENTRATE) 10 MG /0.5 ML PO SOLN: 10.0000 mg | ORAL | Status: DC | PRN

## 2015-12-03 MED ORDER — LORAZEPAM 2 MG/ML PO CONC: 1.0000 mg | ORAL | Status: AC | PRN

## 2015-12-03 MED ORDER — LORAZEPAM 2 MG PO TABS: 2.0000 mg | ORAL_TABLET | Freq: Three times a day (TID) | ORAL | Status: AC

## 2015-12-03 MED ORDER — MORPHINE SULFATE (CONCENTRATE) 10 MG/0.5ML PO SOLN: 2.5000 mg | ORAL | Status: AC

## 2015-12-03 MED ORDER — LORAZEPAM 1 MG PO TABS
2.0000 mg | ORAL_TABLET | Freq: Three times a day (TID) | ORAL | Status: DC
  Administered 2015-12-03: 0.5 mg via ORAL

## 2015-12-03 MED ORDER — MORPHINE SULFATE (CONCENTRATE) 10 MG/0.5ML PO SOLN
2.5000 mg | ORAL | Status: DC
  Administered 2015-12-03 – 2015-12-04 (×5): 2.6 mg via ORAL
  Filled 2015-12-03 (×6): qty 0.5

## 2015-12-03 MED ORDER — LORAZEPAM 0.5 MG PO TABS
0.5000 mg | ORAL_TABLET | Freq: Three times a day (TID) | ORAL | Status: DC
  Administered 2015-12-03 – 2015-12-04 (×2): 0.5 mg via ORAL
  Filled 2015-12-03 (×3): qty 1

## 2015-12-03 MED ORDER — MORPHINE SULFATE (CONCENTRATE) 10 MG/0.5ML PO SOLN: 2.5000 mg | ORAL | Status: DC | PRN

## 2015-12-03 MED ORDER — LORAZEPAM 0.5 MG PO TABS
0.5000 mg | ORAL_TABLET | ORAL | Status: DC | PRN
  Administered 2015-12-04: 0.5 mg via ORAL
  Filled 2015-12-03: qty 1

## 2015-12-03 MED ORDER — CETYLPYRIDINIUM CHLORIDE 0.05 % MT LIQD
7.0000 mL | Freq: Two times a day (BID) | OROMUCOSAL | Status: DC
  Administered 2015-12-03 – 2015-12-04 (×2): 7 mL via OROMUCOSAL

## 2015-12-03 MED ORDER — MORPHINE SULFATE (CONCENTRATE) 10 MG/0.5ML PO SOLN
2.5000 mg | ORAL | Status: DC | PRN
  Administered 2015-12-04 (×2): 2.6 mg via ORAL
  Filled 2015-12-03 (×2): qty 0.5

## 2015-12-03 NOTE — Clinical Social Work Note (Signed)
CSW facilitated discharge.  CSW notified patient's daughter, [name], who was at bedside that patient was being discharged today and would return to [facility].  CSW discussed Hospice following patient at the facility with Sissy.  Sissy was agreeable to patient returning to [facility] and being followed by Hospice.  CSW notified Kristin Bruins that patient was being discharged today and would be followed by Hospice. Keri advised that patient could come back to the facility under her Medicaid days. She stated that patient did not need an FL2.   CSW spoke to [facility] and advised that the referral would be sent.  CSW sent discharge clinicals via [employer].  CSW signing off.    Denyla Cortese, Clydene Pugh, LCSW

## 2015-12-03 NOTE — Discharge Summary (Addendum)
Physician Discharge Summary  Stephanie [name] ZOX:[id number] DOB: Aug 21, 1941 DOA: 11/29/2015  PCP: [name], MD  Admit date: 11/29/2015 Discharge date: 12/03/2015  Time spent: 35 minutes  Recommendations for Outpatient Follow-up:  1. Stephanie [name] will be discharged on hospice services. Family members wish to keep Stephanie [name] comfortable. Stephanie [name] was discharged on Morphine sulfate and Ativan 2.   Symptom management per hospice protocol  Discharge Diagnoses:  Principal Problem:   GI bleed Active Problems:   Anemia   Hypotension   Rectal bleeding   Paroxysmal atrial fibrillation (HCC)   Colitis   Acute kidney injury superimposed on chronic kidney disease ([place])   DNR (do not resuscitate) discussion   Discharge Condition: Stable for transport  Diet recommendation: As tolerated, comfort feeds  Filed Weights   11/29/15 2202  Weight: 62.9 kg (138 lb 10.7 oz)    History of present illness:  Stephanie [name] is a 75 year old female with a past medical history of CVA, hypertension, hyperlipidemia, CKD stage 3, atrial fibrillation on Eliquis, lung cancer, s/p right hip replacement on 1/8; who presents from skilled nursing facility with complaints of rectal bleeding. Patient's history is obtained from the family, and they state no previous history of bleeding previously. Patient complains of right hip pain. However they've also noticed that Stephanie [name] blood pressure has been intermittently low in recent weeks. Family states patient is a DO NOT RESUSCITATE. They deny any NSAID use and patient is on Eliquis for history of atrial fibrillation. Patient did not try anything that made symptoms better or worse.  On admission patient was evaluated and seen to have signs of a colitis of the descending colon on CT of the abdomen and pelvis. There was questionable right kidney involvement. Urinalysis had not been collected at that time however.   Hospital Course:  Stephanie [name] is a 75 year old female with multiple comorbidities including  history of CVA, atrial fibrillation advanced on chronic anticoagulation, chronic kidney disease, history of lung cancer, recent hip replacement on 11/08/2015, having a decline over the past several months, admitted to the medicine service on 11/29/2015 when Stephanie [name] presented with rectal bleeding. Initial workup included a CT scan of abdomen and pelvis that showed evidence of colitis. He was started on empiric IV antibiotic therapy with ciprofloxacin and Flagyl. Family members expressing concerns for Stephanie [name] quality of life, stating that 'Stephanie [name] would not have wanted to live like this." Palliative care was consulted. Palliative Care held family meeting on 12/02/2015 to discuss medical goals of care. Family members reported that Stephanie [name] would have wanted to be kept comfortable and agreed that Stephanie [name] care focus on comfort and management of symptoms. I spoke with Stephanie [name] daughters [name] and [name] on 12/03/2015 reaffrimed goals of care. I explained that life expectancy was limited and explained what changes to expect with end of life. I explained that Stephanie [name] could receive comfort feedings understanding that this could lead to aspiration. I also explained that certain medications such as statins and appetite suppressants would not be beneficial at this point and only increase the risk of adverse reactions. Will discharge on morphine sulfate and ativan.   Consultations:  Palliative Care  Discharge Exam: Filed Vitals:   12/02/15 2247 12/03/15 0452  BP: 125/40 159/72  Pulse: 68 72  Temp: 99 F (37.2 C) 97.7 F (36.5 C)  Resp: 20 20     General: Chronically ill appearing, nonverbal, not following commands, Stephanie [name] seems more comfortable today, resting comfortably  Cardiovascular: 2/6 SEM, RRR  Respiratory: Lungs appeared CTA,  poor inspiratory effort, pt not following commands  Abdomen: Soft, Nontender, nondistended  Musculoskeletal: No edema  Discharge Instructions   Discharge Instructions    Call MD for:  difficulty  breathing, headache or visual disturbances    Complete by:  As directed      Call MD for:  extreme fatigue    Complete by:  As directed      Call MD for:  hives    Complete by:  As directed      Call MD for:  persistant dizziness or light-headedness    Complete by:  As directed      Call MD for:  persistant nausea and vomiting    Complete by:  As directed      Call MD for:  redness, tenderness, or signs of infection (pain, swelling, redness, odor or green/yellow discharge around incision site)    Complete by:  As directed      Call MD for:  severe uncontrolled pain    Complete by:  As directed      Call MD for:  temperature >100.4    Complete by:  As directed      Call MD for:    Complete by:  As directed      Diet - low sodium heart healthy    Complete by:  As directed      Increase activity slowly    Complete by:  As directed           Current Discharge Medication List    START taking these medications   Details  LORazepam (ATIVAN) 2 MG/ML concentrated solution Take 0.5 mLs (1 mg total) by mouth every 4 (four) hours as needed for anxiety. Qty: 30 mL, Refills: 0    Morphine Sulfate (MORPHINE CONCENTRATE) 10 mg / 0.5 ml concentrated solution Take 0.5 mLs (10 mg total) by mouth every 3 (three) hours as needed for severe pain. Qty: 1 Bottle, Refills: 0      CONTINUE these medications which have NOT CHANGED   Details  acetaminophen (TYLENOL) 500 MG tablet Take 1 tablet (500 mg total) by mouth every 8 (eight) hours. Qty: 30 tablet, Refills: 0    amiodarone (PACERONE) 400 MG tablet Take 1 tablet (400 mg total) by mouth 2 (two) times daily.    metoprolol (LOPRESSOR) 50 MG tablet Take 1 tablet (50 mg total) by mouth 2 (two) times daily.    traZODone (DESYREL) 25 mg TABS tablet Take 25 mg by mouth at bedtime as needed for sleep.    Maltodextrin-Xanthan Gum (RESOURCE THICKENUP CLEAR) POWD As needed      STOP taking these medications     ALPRAZolam (XANAX) 0.25 MG tablet       apixaban (ELIQUIS) 5 MG TABS tablet      clonazePAM (KLONOPIN) 0.5 MG tablet      DULoxetine (CYMBALTA) 60 MG capsule      omeprazole (PRILOSEC) 20 MG capsule      simvastatin (ZOCOR) 20 MG tablet      traMADol (ULTRAM) 50 MG tablet      pantoprazole (PROTONIX) 20 MG tablet      senna-docusate (SENOKOT-S) 8.6-50 MG per tablet        No Known Allergies    The results of significant diagnostics from this hospitalization (including imaging, microbiology, ancillary and laboratory) are listed below for reference.    Significant Diagnostic Studies: Dg Abd 1 View  11/11/2015  CLINICAL DATA:  Evaluate NG tube placed EXAM: ABDOMEN -  1 VIEW COMPARISON:  08/23/14 FINDINGS: A weighted feeding tube is identified. The tip is in the expected location of the distal stomach or proximal duodenum. The bowel gas pattern is on unremarkable. No dilated loops of small bowel identified. IMPRESSION: 1. Weighted feeding tube is either in the distal stomach or proximal duodenum. Electronically Signed   By: Kerby Moors M.D.   On: 11/11/2015 11:31   Ct Head Wo Contrast  11/12/2015  CLINICAL DATA:  Initial evaluation for recent acute mental status change. Recent stroke. EXAM: CT HEAD WITHOUT CONTRAST TECHNIQUE: Contiguous axial images were obtained from the base of the skull through the vertex without intravenous contrast. COMPARISON:  Previous MRI and CT from 10/22/2015. FINDINGS: Study is somewhat limited by patient positioning. Diffuse prominence of the CSF containing spaces is compatible with generalized age-related cerebral atrophy. Patchy and confluent hypodensity within the periventricular and deep white matter most compatible with chronic small vessel ischemic disease. Small vessel type changes within the central pons. Intracranial vascular calcifications noted. Remote lacunar infarcts within the bilateral basal ganglia. These changes are stable relative to prior study. No evidence for acute large  vessel territory infarct. No intracranial hemorrhage. No mass lesion, mass effect, or midline shift. Ventricular prominence related to global parenchymal volume loss without hydrocephalus. No extra-axial fluid collection. Scalp soft tissues demonstrate no acute abnormality. Globes and orbits within normal limits. Paranasal sinuses are clear. NG tube in place. No mastoid effusion. Calvarium intact IMPRESSION: 1. No acute intracranial process. Specifically, no evidence for acute or recent ischemia. 2. Stable atrophy with chronic microvascular ischemic disease and remote bilateral lacunar infarcts. Electronically Signed   By: Jeannine Boga M.D.   On: 11/12/2015 20:47   Ct Abdomen Pelvis W Contrast  11/29/2015  CLINICAL DATA:  Dark red blood in adult diaper today. On 11/06/2015, with IM nail placed on 11/08/2015. History of lung cancer and hypertension. EXAM: CT ABDOMEN AND PELVIS WITH CONTRAST TECHNIQUE: Multidetector CT imaging of the abdomen and pelvis was performed using the standard protocol following bolus administration of intravenous contrast. CONTRAST:  135m OMNIPAQUE IOHEXOL 300 MG/ML  SOLN COMPARISON:  Multiple exams, including 04/11/2015 FINDINGS: Lower chest: Interstitial and airspace opacities in both lower lobes into a lesser degree in the right middle lobe. Coronary atherosclerosis and mild cardiomegaly. Mitral annular calcification. Hepatobiliary: Mildly prominent caudate lobe and lateral segment, possibly an early morphologic finding of cirrhosis. Dependent density in the gallbladder may reflect sludge or small gallstones. Mildly distended gallbladder. No biliary dilatation. No significant abnormal focal hepatic lesion. Pancreas: Unremarkable Spleen: Unremarkable Adrenals/Urinary Tract: Mild bilateral renal atrophy. Bilateral peripelvic cysts. Several small hypodense lesions of the right kidney are statistically likely to represent cysts although it is technically nonspecific due to small  size. In addition, there is a 1.6 cm right mid kidney region of hypoenhancement on urographic phase images. There may be some overlying scarring in this vicinity on portal venous phase images. No findings of leak from the urinary bladder. Stomach/Bowel: Sigmoid colon diverticulosis. Wall thickening in the descending colon appears abnormal. Mild pericolic stranding in the descending colon. Vascular/Lymphatic: Aortoiliac atherosclerotic vascular disease. 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 This has not changed appreciably from 04/11/2015. The celiac trunk, SMA, and IMA appear patent. Reproductive: Uterus absent. Left ovary unremarkable, probable small right ovarian remnant likewise unremarkable. Other: If No supplemental non-categorized findings. Musculoskeletal: Right sacral alar fracture again noted. Fractures of the right superior pubic ramus, right pubic body, and right inferior  pubic ramus show early healing response. Comminuted left proximal femoral fracture involving the femoral neck and intertrochanteric region observe for thigh and nail in place, no obvious complicating feature. There still some hematoma and edema in the vicinity of the fractures. Anterior wedging at L1 is likely chronic. There is mild left foraminal stenosis at L4-5 as well as moderate left and prominent right foraminal stenosis at L5-S1 due to disc uncovering, disc bulge, facet arthropathy, and right foraminal disc protrusion at L5-S1. IMPRESSION: 1. There is abnormal wall thickening in the descending colon with surrounding stranding. I am suspicious for infectious colitis. This could be a cause for rectal bleeding. 2. On urographic phase images there is indistinct hypodensity in the right mid kidney. This is technically nonspecific but could reflect a small region of pyelonephritis. Correlate with urine analysis. 3. Healing fractures of the right pelvis. Right hip IM nail in place traversing comminuted  fracture without complicating feature observed. 4. Bibasilar interstitial and airspace opacities, possibly from edema or less likely pneumonia. 5. Other imaging findings of potential clinical significance: Coronary atherosclerosis and mild cardiomegaly. Mitral annular calcification. Suspected early morphologic signs of hepatic cirrhosis. Possible small layering gallstones in the gallbladder. The gallbladder is mildly distended but not thick-walled. Mild bilateral renal atrophy. Bilateral peripelvic cysts. Small right renal cysts. Sigmoid colon diverticulosis. Aortoiliac atherosclerotic vascular disease. Foraminal impingement at L4-5 and L5-S1. Electronically Signed   By: Van Clines M.D.   On: 11/29/2015 19:26   Ct Hip Right Wo Contrast  11/06/2015  CLINICAL DATA:  [place] 2 days ago.  Deformity of the right leg. EXAM: CT OF THE RIGHT HIP WITHOUT CONTRAST TECHNIQUE: Multidetector CT imaging of the right hip was performed according to the standard protocol. Multiplanar CT image reconstructions were also generated. COMPARISON:  Radiographs 11/03/2005 FINDINGS: There is a severely displaced complex right hip fracture. This is an intertrochanteric fracture with extension into the base of the cervical neck. The greater and lesser trochanter fractures are displaced and comminuted. The femoral head is normally located. The acetabulum is intact. There are superior and inferior pubic rami fractures on the right side also. Significant surrounding hematoma. No intrapelvic hematoma. The pubic symphysis and right SI joint are intact. Right-sided sacral fractures are also noted. IMPRESSION: 1. Severely displaced and comminuted complex right hip fracture with both intertrochanteric and basicervical neck components. 2. Superior and inferior pubic rami fractures on the right side and right-sided sacral fractures. Electronically Signed   By: Marijo Sanes M.D.   On: 11/06/2015 17:02   US Carotid Bilateral  11/05/2015   CLINICAL DATA:  Recent right femoral fracture and history of TIAs EXAM: BILATERAL CAROTID DUPLEX ULTRASOUND TECHNIQUE: Pearline Cables scale imaging, color Doppler and duplex ultrasound were performed of bilateral carotid and vertebral arteries in the neck. COMPARISON:  02/06/2013 FINDINGS: Criteria: Quantification of carotid stenosis is based on velocity parameters that correlate the residual internal carotid diameter with NASCET-based stenosis levels, using the diameter of the distal internal carotid lumen as the denominator for stenosis measurement. The following velocity measurements were obtained: RIGHT ICA:  118/20 cm/sec CCA:  425/95 cm/sec SYSTOLIC ICA/CCA RATIO:  0.6 DIASTOLIC ICA/CCA RATIO:  0.8 ECA:  102 cm/sec LEFT ICA:  141/25 cm/sec CCA:  638/75 cm/sec SYSTOLIC ICA/CCA RATIO:  1.3 DIASTOLIC ICA/CCA RATIO:  1.5 ECA:  119 cm/sec RIGHT CAROTID ARTERY: Grayscale images demonstrate intimal thickening within the common carotid artery as well as plaque formation in the carotid bulb and extending into the proximal internal carotid artery. The  waveforms, velocities and flow velocity ratios show no evidence of focal hemodynamically significant stenosis. RIGHT VERTEBRAL ARTERY:  Antegrade in nature. LEFT CAROTID ARTERY: Intimal thickening is noted within the common carotid artery on the left. Plaque formation in the carotid bulb and extending into the proximal internal carotid artery is noted. The waveforms, velocities and flow velocity ratios again demonstrate a stenosis in the 50-69% range likely in the lower end of that spectrum. LEFT VERTEBRAL ARTERY:  Antegrade in nature. 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 No significant stenosis on the right is noted. Electronically Signed   By: Inez Catalina M.D.   On: 11/05/2015 11:50   Dg Chest Port 1 View  11/29/2015  CLINICAL DATA:  Shortness of breath. EXAM: PORTABLE CHEST 1 VIEW COMPARISON:  11/04/2015  FINDINGS: Increase in diffuse lung opacity. There is a background of low volumes and interstitial lung disease. Chronic cardiomegaly, distorted by leftward rotation and left-sided volume loss. No gross effusion. No pneumothorax. IMPRESSION: 1. Lung opacity above baseline with diffuse appearance suggesting edema. 2. Background interstitial lung disease with fibrosis. Electronically Signed   By: Monte Fantasia M.D.   On: 11/29/2015 22:57   Dg Chest Portable 1 View  11/04/2015  CLINICAL DATA:  Status post fall from wheelchair. Right shoulder pain. Personal history of lung cancer status post left upper lobectomy. Initial encounter. EXAM: PORTABLE CHEST 1 VIEW COMPARISON:  Chest radiograph and CTA of the chest performed 10/30/2015 FINDINGS: The appearance of the lungs is stable from the prior study, reflecting diffuse interstitial lung disease with fibrotic change, worse at the left lung base. Known small pulmonary nodules are not well characterized on radiograph. No definite pleural effusion or pneumothorax is seen. The cardiomediastinal silhouette is borderline normal in size. No acute osseous abnormalities are seen. IMPRESSION: 1. Appearance of the lungs is stable from the prior study, reflecting diffuse interstitial lung disease with fibrotic change, worse at the left lung base. Known small pulmonary nodules are not well characterized on radiograph. 2. No displaced rib fracture seen. Electronically Signed   By: Garald Balding M.D.   On: 11/04/2015 23:50   Dg Shoulder Right Port  11/04/2015  CLINICAL DATA:  Fall from wheelchair.  RIGHT shoulder pain EXAM: PORTABLE RIGHT SHOULDER - 2+ VIEW COMPARISON:  None. FINDINGS: Glenohumeral joint is intact. No evidence of scapular fracture or humeral fracture. The acromioclavicular joint is intact. IMPRESSION: No fracture or dislocation. Electronically Signed   By: Suzy Bouchard M.D.   On: 11/04/2015 23:49   Dg Knee Complete 4 Views Right  11/04/2015  CLINICAL DATA:   Fall from wheelchair at nursing home prior to arrival, now with right lower extremity pain. Right hip pain radiating to the knee. Initial encounter. EXAM: RIGHT KNEE - COMPLETE 4+ VIEW COMPARISON:  None. FINDINGS: Note fracture or dislocation. 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 Detailed evaluation partially obscured related to positioning. No large joint effusion. Vascular calcifications are seen. IMPRESSION: 1. No acute fracture or dislocation. 2. 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 Electronically Signed   By: Jeb Levering M.D.   On: 11/04/2015 19:19   Dg Abd Portable 1v  11/12/2015  CLINICAL DATA:  NG tube placement. EXAM: PORTABLE ABDOMEN - 1 VIEW COMPARISON:  Abdominal radiograph obtained yesterday. FINDINGS: Tip of the weighted enteric tube is unchanged in position in the right upper quadrant, in the region of the distal stomach or proximal duodenum. Nonobstructive bowel  gas pattern. Moderate stool in the right colon. IMPRESSION: Tip of the weighted enteric tube unchanged in position, either in the distal stomach or proximal duodenum. Electronically Signed   By: Jeb Levering M.D.   On: 11/12/2015 21:46   Dg Hip Operative Unilat With Pelvis Right  11/08/2015  CLINICAL DATA:  IM nail of right hip EXAM: OPERATIVE right HIP (WITH PELVIS IF PERFORMED) 2 VIEWS TECHNIQUE: Fluoroscopic spot image(s) were submitted for interpretation post-operatively. COMPARISON:  11/04/2015 FINDINGS: Hip screw an IM nail reduces the intertrochanteric fracture involving the proximal right femur. The fracture fragments and hardware components are in anatomic alignment. No complications identified. IMPRESSION: 1. Status post ORIF of proximal femur fracture. Electronically Signed   By: Kerby Moors M.D.   On: 11/08/2015 11:04   Dg Hip Unilat With Pelvis 2-3 Views Right  11/08/2015  CLINICAL DATA:  Postop right hip pinning.  Confusion.  EXAM: DG HIP (WITH OR WITHOUT PELVIS) 2-3V RIGHT COMPARISON:  Intraoperative fluoroscopic spot images from earlier same day. Comparison also made to earlier plain film of the right hip dated 11/04/2015. FINDINGS: Intra medullary rod with associated dynamic hip screw traverses the right femoral neck fracture site. Hardware appears intact and well aligned. Osseous alignment is anatomic. No surgical complicating features seen. IMPRESSION: IMPRESSION Status post fixation of the right femoral neck fracture. Hardware appears intact and well aligned. No surgical complicating feature. Electronically Signed   By: Franki Cabot M.D.   On: 11/08/2015 12:02   Dg Hip Unilat With Pelvis 2-3 Views Right  11/04/2015  CLINICAL DATA:  Pt fell from wheelchair at nursing home prior to arrival to Radiology. Severe pain in Rt hip with radiation into Rt knee. EXAM: DG HIP (WITH OR WITHOUT PELVIS) 2-3V RIGHT COMPARISON:  None. FINDINGS: Intertrochanteric fracture of the proximal RIGHT femur with varus angulation. There is superior migration of the distal fracture fragment. No dislocation. IMPRESSION: RIGHT intertrochanteric femur fracture. Electronically Signed   By: Suzy Bouchard M.D.   On: 11/04/2015 19:20    Microbiology: Recent Results (from the past 240 hour(s))  MRSA PCR Screening     Status: None   Collection Time: 11/29/15 11:09 PM  Result Value Ref Range Status   MRSA by PCR NEGATIVE NEGATIVE Final    Comment:        The GeneXpert MRSA Assay (FDA approved for NASAL specimens only), is one component of a comprehensive MRSA colonization surveillance program. It is not intended to diagnose MRSA infection nor to guide or monitor treatment for MRSA infections.      Labs: Basic Metabolic Panel:  Recent Labs Lab 11/27/15 0719 11/29/15 1840 12/01/15 0521  NA 137 138 140  K 4.0 4.8 4.2  CL 103 105 111  CO2 27  --  22  GLUCOSE 174* 99 100*  BUN 14 36* 22*  CREATININE 0.95 1.80* 1.34*  CALCIUM 8.8*   --  8.5*   Liver Function Tests:  Recent Labs Lab 11/29/15 1828 12/01/15 0521  AST 25 23  ALT 17 16  ALKPHOS 184* 183*  BILITOT 1.1 0.6  PROT 6.4* 6.1*  ALBUMIN 2.6* 2.5*   No results for input(s): LIPASE, AMYLASE in the last 168 hours. No results for input(s): AMMONIA in the last 168 hours. CBC:  Recent Labs Lab 11/29/15 1828  11/29/15 2232 11/30/15 0249 11/30/15 0704 11/30/15 1027 12/01/15 0521  WBC 9.1  --   --   --   --   --  5.5  NEUTROABS 6.5  --   --   --   --   --   --  HGB 9.3*  < > 10.4* 9.1* 9.5* 9.2* 9.0*  HCT 29.1*  < > 33.7* 29.0* 29.2* 28.4* 28.3*  MCV 100.3*  --   --   --   --   --  100.4*  PLT 249  --   --   --   --   --  178  < > = values in this interval not displayed. Cardiac Enzymes: No results for input(s): CKTOTAL, CKMB, CKMBINDEX, TROPONINI in the last 168 hours. BNP: BNP (last 3 results) No results for input(s): BNP in the last 8760 hours.  ProBNP (last 3 results) No results for input(s): PROBNP in the last 8760 hours.  CBG: No results for input(s): GLUCAP in the last 168 hours.     Signed:  Kelvin Cellar MD.  [facility] 12/03/2015, 9:15 AM

## 2015-12-03 NOTE — Progress Notes (Signed)
Daily Progress Note   Patient Name: Stephanie [name]       Date: 12/03/2015 DOB: [birth year]  Age: 75 y.o. MRN#: [id number] Attending Physician: Kelvin Cellar, MD Primary Care Physician: [name], MD Admit Date: 11/29/2015  Reason for Consultation/Follow-up: Disposition, Inpatient hospice referral, Pain control and Psychosocial/spiritual support  Subjective: Mrs. Matsushita continues to moan and cry out.  Her daughters are at bedside. We meet in my office with Alonna Buckler and son RK on the phone.  We talk about chronic illness trajectory, and the meeting with hospitalist this morning.  Family continues to state they don't want her to suffer and this afternoon [name] tells me that she had to put her own feelings aside and think of her mother's suffering.  RK asks of hospice, "so you're just going to drug her up" and forget her/abandon her?   I, and Kim, reassure him that the focus will be on comfort and dignity and Mrs. Thai will be provided this with Hospice care.    We talk about symptom management and the balance between comfort and wakefulness.  They are requesting [facility].  Family begins to tell stories about Mrs. Maack near the end of our meeting.       Length of Stay: 4 days  Current Medications: Scheduled Meds:  . amiodarone  400 mg Oral BID  . DULoxetine  60 mg Oral Daily  . feeding supplement (ENSURE ENLIVE)  237 mL Oral Q24H  . metoprolol tartrate  25 mg Oral BID  . OLANZapine zydis  5 mg Oral QHS  . sodium chloride flush  3 mL Intravenous Q12H    Continuous Infusions: . sodium chloride 50 mL/hr at 12/02/15 2139    PRN Meds: LORazepam, morphine injection, ondansetron **OR** ondansetron (ZOFRAN) IV, traMADol  Physical Exam: Physical Exam    Nursing note and vitals reviewed.               Vital Signs: BP 141/75 mmHg  Pulse 80  Temp(Src) 97.7 F (36.5 C) (Axillary)  Resp 20  Ht '5\' 5"'$  (1.651 m)  Wt 62.9 kg (138 lb 10.7 oz)  BMI 23.08 kg/m2  SpO2 96% SpO2: SpO2: 96 % O2 Device: O2 Device: Not Delivered O2 Flow Rate: O2 Flow Rate (L/min): 2 L/min  Intake/output  summary:  Intake/Output Summary (Last 24 hours) at 12/03/15 1423 Last data filed at 12/03/15 0600  Gross per 24 hour  Intake   1200 ml  Output      0 ml  Net   1200 ml   LBM: Last BM Date: 12/01/15 Baseline Weight: Weight: 62.9 kg (138 lb 10.7 oz) Most recent weight: Weight: 62.9 kg (138 lb 10.7 oz)       Palliative Assessment/Data: Flowsheet Rows        Most Recent Value   Intake Tab    Referral Department  Hospitalist   Unit at Time of Referral  Med/Surg Unit   Palliative Care Primary Diagnosis  Cardiac   Date Notified  12/01/15   Palliative Care Type  Return patient Palliative Care   Reason for referral  Clarify Goals of Care, Psychosocial or Spiritual support, End of Life Care Assistance   Date of Admission  11/29/15   Date first seen by Palliative Care  12/02/15   # of days Palliative referral response time  1 Day(s)   # of days IP prior to Palliative referral  2   Clinical Assessment    Palliative Performance Scale Score  20%   Pain Max last 24 hours  Not able to report   Pain Min Last 24 hours  Not able to report   Dyspnea Max Last 24 Hours  Not able to report   Dyspnea Min Last 24 hours  Not able to report   Psychosocial & Spiritual Assessment    Social Work Plan of Care  Staff support   Palliative Care Outcomes    Patient/Family meeting held?  Yes   Who was at the meeting?  daughter [name], brother Konrad Dolores and neice [place]  Provided end of life care assistance, Provided psychosocial or spiritual support, Counseled regarding hospice, Clarified goals of care   Patient/Family wishes: Interventions  discontinued/not started   Mechanical Ventilation   Palliative Care follow-up planned  Yes, Facility      Additional Data Reviewed: CBC    Component Value Date/Time   WBC 5.5 12/01/2015 0521   RBC 2.82* 12/01/2015 0521   HGB 9.0* 12/01/2015 0521   HCT 28.3* 12/01/2015 0521   PLT 178 12/01/2015 0521   MCV 100.4* 12/01/2015 0521   MCH 31.9 12/01/2015 0521   MCHC 31.8 12/01/2015 0521   RDW 15.6* 12/01/2015 0521   LYMPHSABS 1.1 11/29/2015 1828   MONOABS 1.1* 11/29/2015 1828   EOSABS 0.4 11/29/2015 1828   BASOSABS 0.0 11/29/2015 1828    CMP     Component Value Date/Time   NA 140 12/01/2015 0521   K 4.2 12/01/2015 0521   CL 111 12/01/2015 0521   CO2 22 12/01/2015 0521   GLUCOSE 100* 12/01/2015 0521   BUN 22* 12/01/2015 0521   CREATININE 1.34* 12/01/2015 0521   CALCIUM 8.5* 12/01/2015 0521   PROT 6.1* 12/01/2015 0521   ALBUMIN 2.5* 12/01/2015 0521   AST 23 12/01/2015 0521   ALT 16 12/01/2015 0521   ALKPHOS 183* 12/01/2015 0521   BILITOT 0.6 12/01/2015 0521   GFRNONAA 38* 12/01/2015 0521   GFRAA 44* 12/01/2015 0521       Problem List:  Patient Active Problem List   Diagnosis Date Noted  . DNR (do not resuscitate) discussion   . Rectal bleeding 11/29/2015  . Paroxysmal atrial fibrillation ([place]) 11/29/2015  . Colitis 11/29/2015  . Acute kidney injury superimposed on chronic kidney disease ([place]) 11/29/2015  .  GI bleed 11/29/2015  . Malnutrition of moderate degree 11/12/2015  . DNR (do not resuscitate) 11/12/2015  . Palliative care encounter 11/12/2015  . Postoperative anemia due to acute blood loss 11/09/2015  . Fracture, intertrochanteric, right femur ([place]) 11/08/2015  . right superior and inferior pubic rami 11/07/2015  . Closed right hip fracture ([place])   . Atrial fibrillation ([place])   . Hip fracture, right ([place]) 11/04/2015  . CKD (chronic kidney disease), stage III 11/04/2015  . Sacral fracture, closed ([place]) 11/04/2015  . Chest pain 10/31/2015  . TIA  (transient ischemic attack) 10/22/2015  . Dysphagia 10/22/2015  . Rash and nonspecific skin eruption 09/22/2015  . Cellulitis 07/31/2015  . Low back pain 07/22/2015  . Back pain 04/14/2015  . UTI (urinary tract infection) 04/14/2015  . History of CVA (cerebrovascular accident) 04/14/2015  . Mental status change 03/16/2015  . Hypotension 03/16/2015  . Acute encephalopathy 03/16/2015  . Encephalopathy acute 03/16/2015  . Altered mental status   . Arterial hypotension   . Cough 11/10/2014  . Anemia 11/10/2014  . Dyspnea 10/16/2014  . Acute on chronic renal failure ([place]) 10/16/2014  . Bronchitis 10/15/2014  . Diastolic CHF, acute ([place]) 10/15/2014  . Hyperlipidemia 05/20/2014  . Lack of coordination 02/19/2013  . Muscle weakness (generalized) 02/19/2013  . Dysarthria as late effect of stroke 02/04/2013  . Bilateral carotid artery disease ([place]) 02/04/2013  . Other and unspecified hyperlipidemia 02/04/2013  . Adenocarcinoma of left lung, stage 1 ([place]) 10/19/2011  . Pneumothorax of left lung after biopsy 10/14/2011    Class: Acute  . CVA (cerebral infarction) 09/17/2011  . HTN (hypertension) 09/17/2011     Palliative Care Assessment & Plan    1.Code Status:  DNR    Code Status Orders        Start     Ordered   11/29/15 2208  Do not attempt resuscitation (DNR)   Continuous    Question Answer Comment  In the event of cardiac or respiratory ARREST Do not call a "code blue"   In the event of cardiac or respiratory ARREST Do not perform Intubation, CPR, defibrillation or ACLS   In the event of cardiac or respiratory ARREST Use medication by any route, position, wound care, and other measures to relive pain and suffering. May use oxygen, suction and manual treatment of airway obstruction as needed for comfort.      11/29/15 2207    Code Status History    Date Active Date Inactive Code Status Order ID Comments User Context   11/08/2015  2:06 PM 11/17/2015  8:28 PM DNR [id number]   Rod Can, MD Inpatient   11/05/2015 12:55 AM 11/08/2015  2:06 PM DNR [id number]  Orvan Falconer, MD Inpatient   11/04/2015 10:44 PM 11/05/2015 12:55 AM DNR [id number]  Orvan Falconer, MD ED   10/23/2015  7:27 AM 10/23/2015  4:53 PM DNR [id number]  Orvan Falconer, MD Inpatient   10/22/2015  4:04 PM 10/23/2015  7:27 AM Full Code [id number]  Orvan Falconer, MD Inpatient   10/22/2015  2:48 PM 10/22/2015  4:04 PM DNR [id number]  Orvan Falconer, MD ED   03/16/2015  4:18 PM 03/17/2015  3:54 PM DNR [id number]  Radene Gunning, NP ED   10/15/2014 11:43 PM 10/17/2014  9:00 PM DNR [id number]  Oswald Hillock, MD Inpatient   11/18/2011  5:55 PM 11/23/2011  2:17 PM Full Code [id number]  Connye Burkitt, RN Inpatient   10/14/2011  9:21 PM 10/19/2011 12:55 PM Full Code  [id number]  Beau Fanny, RN Inpatient    Advance Directive Documentation        Most Recent Value   Type of Advance Directive  Out of facility DNR (pink MOST or yellow form)   Pre-existing out of facility DNR order (yellow form or pink MOST form)  Physician notified to receive inpatient order, Yellow form placed in chart (order not valid for inpatient use)   "MOST" Form in Place?         2. Goals of Care/Additional Recommendations:  Full comfort care, family request Hospice in place, with transfer to [facility] home when bed becomes available.   Limitations on Scope of Treatment: Full Comfort Care  Desire for further Chaplaincy support:yes  Psycho-social Needs: Grief/Bereavement Support  3. Symptom Management:      1 Morphine and Ativan scheduled.   4. Palliative Prophylaxis:   Aspiration, Frequent Pain Assessment, Oral Care and Turn Reposition  5. Prognosis: < 2 weeks, likely d/t decreased intake.   6. Discharge Planning:  Recommed GIP Admission (Hospice in Place)   Care plan was discussed with   Thank you for allowing the Palliative Medicine Team to assist in the care of this patient.   Time In: 1330 Time Out: 1440 Total Time 70 minutes Prolonged Time  Billed  yes         Drue Novel, NP  12/03/2015, 2:23 PM  Please contact Palliative Medicine Team phone at [phone] for questions and concerns.

## 2015-12-03 NOTE — Care Management Important Message (Signed)
Important Message  Patient Details  Name: Stephanie [name] MRN: [id number] Date of Birth: 12/26/1940   Medicare Important Message Given:  Yes    Alvie Heidelberg, RN 12/03/2015, 11:31 AM

## 2015-12-04 DIAGNOSIS — Z7189 Other specified counseling: Secondary | ICD-10-CM

## 2015-12-04 DIAGNOSIS — Z515 Encounter for palliative care: Secondary | ICD-10-CM

## 2015-12-04 NOTE — Discharge Summary (Signed)
Addendum  GI bleed could not clinically determine etiology. SHhe was transitioned to comfort and did not undergo further tests/procedures.

## 2015-12-04 NOTE — H&P (Signed)
[facility] History and Physical  Stephanie [name] FTD:[id number] DOB: Nov 02, 1940 DOA: 12/03/2015  Referring physician:  PCP: Stephanie Lange, MD   Chief Complaint: Comfort care  HPI: Stephanie [name] is a 75 y.o. female with multiple comorbidities including history of CVA, atrial fibrillation advanced on chronic anticoagulation, chronic kidney disease, history of lung cancer, recent hip replacement on 11/08/2015, having a decline over the past several months, admitted to the medicine service on 11/29/2015 when she presented with rectal bleeding. Initial workup included a CT scan of abdomen and pelvis that showed evidence of colitis. He was started on empiric IV antibiotic therapy with ciprofloxacin and Flagyl. Family members expressing concerns for her quality of life, stating that 'she would not have wanted to live like this." Palliative care was consulted. Palliative Care held family meeting on 12/02/2015 to discuss medical goals of care. Family members reported that Mrs Stephanie [name] would have wanted to be kept comfortable and agreed that her care focus on comfort and management of symptoms. I spoke with [name] and Stephanie [name] on 12/03/2015 reaffrimed goals of care. I explained that life expectancy was limited and explained what changes to expect with end of life. I explained that she could receive comfort feedings understanding that this could lead to aspiration. I also explained that certain medications such as statins and appetite suppressants would not be beneficial at this point and only increase the risk of adverse reactions.   After multiple discussions with family members, they elected for her to continue receiving care at the inpatient hospice facility rather than going to her skilled nursing facility for hospice. She will be discharged and readmitted to palliative care team until bed becomes available.    Review of Systems:  Can not obtain reliable review of systems   Past Medical History    Diagnosis Date  . CVA (cerebral infarction)     speech difficulities  . Stroke ([place])   . Hypercholesteremia     Takes Zocor daily  . Hypertension     takes Atenolol and Lisinopril daily  . Lung mass     left upper lobe  . Arthritis     hands  . TIA (transient ischemic attack)     Sat before thanksgiving;impaired speech  . Pneumothorax   . Lung cancer ([place])   . CKD (chronic kidney disease), stage III   . Fracture, hip ([place])   . CHF (congestive heart failure) ([place])   . Muscle weakness (generalized)   . Dysphagia   . COPD (chronic obstructive pulmonary disease) ([place])   . Falls   . Abnormal posture    Past Surgical History  Procedure Laterality Date  . Lung biopsy  10/13/11    left  . Abdominal hysterectomy  1975  . Varicose vein surgery  15yr ago  . Video assisted thoracoscopy  11/18/2011    Procedure: VIDEO ASSISTED THORACOSCOPY;  Surgeon: SMelrose Nakayama MD;  Location: [facility]  Service: Thoracic;  Laterality: Left;  . Lobectomy  11/18/2011    Procedure: LOBECTOMY;  Surgeon: SMelrose Nakayama MD;  [facility]  Service: Thoracic;  Laterality: Left;  LEFT UPPER LOBECTOMY  . Breast biopsy  early 2000  . Intramedullary (im) nail intertrochanteric Right 11/08/2015    Procedure: INTRAMEDULLARY (IM) NAIL INTERTROCHANTERIC;  Surgeon: BRod Can MD;  [facility]  Service: Orthopedics;  Laterality: Right;   Social History:  reports that she has never smoked. She has never used smokeless tobacco. She reports that she does  not drink alcohol or use illicit drugs.  No Known Allergies  Family History  Problem Relation Age of Onset  . Stroke Mother   . Stroke Sister   . Anesthesia problems Neg Hx   . Hypotension Neg Hx   . Malignant hyperthermia Neg Hx   . Pseudochol deficiency Neg Hx   . Diabetes Brother   . Cancer Brother   . Stroke Brother   . Colon cancer Neg Hx     Prior to Admission medications   Medication Sig Start Date End Date Taking?  Authorizing Provider  acetaminophen (TYLENOL) 500 MG tablet Take 1 tablet (500 mg total) by mouth every 8 (eight) hours. 11/17/15   Geradine Girt, DO  amiodarone (PACERONE) 400 MG tablet Take 1 tablet (400 mg total) by mouth 2 (two) times daily. 11/17/15   Geradine Girt, DO  LORazepam (ATIVAN) 2 MG tablet Take 1 tablet (2 mg total) by mouth 3 (three) times daily. 12/03/15   Kelvin Cellar, MD  LORazepam (ATIVAN) 2 MG/ML concentrated solution Take 0.5 mLs (1 mg total) by mouth every 4 (four) hours as needed for anxiety. 12/03/15   Kelvin Cellar, MD  Maltodextrin-Xanthan Gum ([place]) POWD As needed 11/17/15   Geradine Girt, DO  metoprolol (LOPRESSOR) 50 MG tablet Take 1 tablet (50 mg total) by mouth 2 (two) times daily. 11/17/15   Geradine Girt, DO  Morphine Sulfate (MORPHINE CONCENTRATE) 10 MG/0.5ML SOLN concentrated solution Place 0.13 mLs (2.6 mg total) under the tongue every 4 (four) hours. 12/03/15   Kelvin Cellar, MD  Morphine Sulfate (MORPHINE CONCENTRATE) 10 MG/0.5ML SOLN concentrated solution Take 0.13 mLs (2.6 mg total) by mouth every 2 (two) hours as needed for moderate pain or severe pain. 12/03/15   Kelvin Cellar, MD  traZODone (DESYREL) 25 mg TABS tablet Take 25 mg by mouth at bedtime as needed for sleep.    Historical Provider, MD   Physical Exam: Filed Vitals:   12/03/15 1749 12/03/15 2235 12/04/15 0622 12/04/15 0744  BP: 137/58 133/54 140/72   Pulse: 84 78 101   Temp: 98.9 F (37.2 C) 98.4 F (36.9 C) 98.2 F (36.8 C)   TempSrc: Oral Oral Oral   Resp: '20 20 18   '$ Height:      Weight:      SpO2: 97% 98% 96% 95%    Wt Readings from Last 3 Encounters:  12/03/15 63.504 kg (140 lb)  11/29/15 62.9 kg (138 lb 10.7 oz)  11/17/15 66.044 kg (145 lb 9.6 oz)    1. General: Appears uncomfortable, daughters present at bedside 2. Cardiovascular: 2/6 SEM, RRR 3. Respiratory: Lungs appeared CTA, poor inspiratory effort, pt not following commands 4. Abdomen: Soft,  mild generalized tenderness to palpation 5. Musculoskeletal: No edema          Labs on Admission:  Basic Metabolic Panel:  Recent Labs Lab 11/29/15 1840 12/01/15 0521  NA 138 140  K 4.8 4.2  CL 105 111  CO2  --  22  GLUCOSE 99 100*  BUN 36* 22*  CREATININE 1.80* 1.34*  CALCIUM  --  8.5*   Liver Function Tests:  Recent Labs Lab 11/29/15 1828 12/01/15 0521  AST 25 23  ALT 17 16  ALKPHOS 184* 183*  BILITOT 1.1 0.6  PROT 6.4* 6.1*  ALBUMIN 2.6* 2.5*   No results for input(s): LIPASE, AMYLASE in the last 168 hours. No results for input(s): AMMONIA in the last 168 hours. CBC:  Recent Labs  Lab 11/29/15 1828  11/29/15 2232 11/30/15 0249 11/30/15 0704 11/30/15 1027 12/01/15 0521  WBC 9.1  --   --   --   --   --  5.5  NEUTROABS 6.5  --   --   --   --   --   --   HGB 9.3*  < > 10.4* 9.1* 9.5* 9.2* 9.0*  HCT 29.1*  < > 33.7* 29.0* 29.2* 28.4* 28.3*  MCV 100.3*  --   --   --   --   --  100.4*  PLT 249  --   --   --   --   --  178  < > = values in this interval not displayed. Cardiac Enzymes: No results for input(s): CKTOTAL, CKMB, CKMBINDEX, TROPONINI in the last 168 hours.  BNP (last 3 results) No results for input(s): BNP in the last 8760 hours.  ProBNP (last 3 results) No results for input(s): PROBNP in the last 8760 hours.  CBG: No results for input(s): GLUCAP in the last 168 hours.  Radiological Exams on Admission: No results found.    Assessment/Plan  1.  Probable lower GI bleed -Patient with history of atrial fibrillation have been on chronic anticoagulation presented with bloody stools. CT scan of abdomen and pelvis revealed findings consistent with colitis. -Suspect that anticoagulation likely precipitated GI bleed. -Patient seen and evaluated by GI. -She is now comfort Care  2. History of paroxysmal atrial fibrillation -CHADSVasc score of 5 -Anticoagulation was discontinued due to GI bleed. -Now Comfort Care  3. Acute kidney  injury -Likely secondary to minimal by mouth intake as patient has had a steep decline in function in the days prior to this hospitalization  4. Goals of care -Patient to be discharged and readmitted to Palliative Care until inpatient hospice bed becomes available. She is Comfort Care   Code Status: DNR   Time spent:   Kelvin Cellar [facility] Pager

## 2015-12-04 NOTE — Progress Notes (Signed)
Daily Progress Note   Patient Name: Stephanie [name]       Date: 12/04/2015 DOB: Aug 25, 1941  Age: 75 y.o. MRN#: [id number] Attending Physician: Kelvin Cellar, MD Primary Care Physician: [name], MD Admit Date: 12/03/2015  Reason for Consultation/Follow-up: Disposition, Inpatient hospice referral, Pain control, Psychosocial/spiritual support and Terminal Care  Subjective: Stephanie [name] is lying in bed moaning and crying out.  [name] and Stephanie [name] are at bedside along with 2 un-named females.  I share with Stephanie [name] that dt the pelvic fracture she will cry out when moved. I encourage them to ask her to be medicated prior to moving.  We talk about what will happen at the hospice home, and one of the females grabs my arm stating, "we are not using that word", but that she will move to a larger room.  Stephanie [name] and I talk in the hall, she shares that she fears family has false hope because Stephanie [name] has eyes open now.  We talk about decreasing pain and anxiety helping her be able to interact.   I encourage her to share her concerns with hospice workers and they will help her with communication with her family about the realities of Stephanie [name]'s condition.  I encourage them to take care of one another.   Length of Stay: 1 day  Current Medications: Scheduled Meds:  . antiseptic oral rinse  7 mL Mouth Rinse BID  . LORazepam  0.5 mg Oral 3 times per day  . morphine CONCENTRATE  2.6 mg Oral 6 times per day    Continuous Infusions:    PRN Meds: LORazepam, morphine CONCENTRATE  Physical Exam: Physical Exam  Constitutional: She appears distressed.  HENT:  Head: Normocephalic and atraumatic.  Pulmonary/Chest: Effort normal.  Neurological:  Eyes open today, still moaning, no meaningful  communication.   Skin: Skin is warm and dry.  Nursing note and vitals reviewed.               Vital Signs: BP 140/72 mmHg  Pulse 101  Temp(Src) 98.2 F (36.8 C) (Oral)  Resp 18  Ht '5\' 5"'$  (1.651 m)  Wt 63.504 kg (140 lb)  BMI 23.30 kg/m2  SpO2 95% SpO2: SpO2: 95 % O2 Device: O2 Device: Not Delivered O2 Flow Rate:    Intake/output summary:  Intake/Output  Summary (Last 24 hours) at 12/04/15 1341 Last data filed at 12/04/15 1002  Gross per 24 hour  Intake      0 ml  Output      0 ml  Net      0 ml   LBM:   Baseline Weight: Weight: 63.504 kg (140 lb) Most recent weight: Weight: 63.504 kg (140 lb)       Palliative Assessment/Data:   Additional Data Reviewed: CBC    Component Value Date/Time   WBC 5.5 12/01/2015 0521   RBC 2.82* 12/01/2015 0521   HGB 9.0* 12/01/2015 0521   HCT 28.3* 12/01/2015 0521   PLT 178 12/01/2015 0521   MCV 100.4* 12/01/2015 0521   MCH 31.9 12/01/2015 0521   MCHC 31.8 12/01/2015 0521   RDW 15.6* 12/01/2015 0521   LYMPHSABS 1.1 11/29/2015 1828   MONOABS 1.1* 11/29/2015 1828   EOSABS 0.4 11/29/2015 1828   BASOSABS 0.0 11/29/2015 1828    CMP     Component Value Date/Time   NA 140 12/01/2015 0521   K 4.2 12/01/2015 0521   CL 111 12/01/2015 0521   CO2 22 12/01/2015 0521   GLUCOSE 100* 12/01/2015 0521   BUN 22* 12/01/2015 0521   CREATININE 1.34* 12/01/2015 0521   CALCIUM 8.5* 12/01/2015 0521   PROT 6.1* 12/01/2015 0521   ALBUMIN 2.5* 12/01/2015 0521   AST 23 12/01/2015 0521   ALT 16 12/01/2015 0521   ALKPHOS 183* 12/01/2015 0521   BILITOT 0.6 12/01/2015 0521   GFRNONAA 38* 12/01/2015 0521   GFRAA 44* 12/01/2015 0521       Problem List:  Patient Active Problem List   Diagnosis Date Noted  . DNR (do not resuscitate) discussion   . Rectal bleeding 11/29/2015  . Paroxysmal atrial fibrillation ([place]) 11/29/2015  . Colitis 11/29/2015  . Acute kidney injury superimposed on chronic kidney disease ([place]) 11/29/2015  . GI bleed  11/29/2015  . Malnutrition of moderate degree 11/12/2015  . DNR (do not resuscitate) 11/12/2015  . Palliative care encounter 11/12/2015  . Postoperative anemia due to acute blood loss 11/09/2015  . Fracture, intertrochanteric, right femur ([place]) 11/08/2015  . right superior and inferior pubic rami 11/07/2015  . Closed right hip fracture ([place])   . Atrial fibrillation ([place])   . Hip fracture, right ([place]) 11/04/2015  . CKD (chronic kidney disease), stage III 11/04/2015  . Sacral fracture, closed ([place]) 11/04/2015  . Chest pain 10/31/2015  . TIA (transient ischemic attack) 10/22/2015  . Dysphagia 10/22/2015  . Rash and nonspecific skin eruption 09/22/2015  . Cellulitis 07/31/2015  . Low back pain 07/22/2015  . Back pain 04/14/2015  . UTI (urinary tract infection) 04/14/2015  . History of CVA (cerebrovascular accident) 04/14/2015  . Mental status change 03/16/2015  . Hypotension 03/16/2015  . Acute encephalopathy 03/16/2015  . Encephalopathy acute 03/16/2015  . Altered mental status   . Arterial hypotension   . Cough 11/10/2014  . Anemia 11/10/2014  . Dyspnea 10/16/2014  . Acute on chronic renal failure ([place]) 10/16/2014  . Bronchitis 10/15/2014  . Diastolic CHF, acute ([place]) 10/15/2014  . Hyperlipidemia 05/20/2014  . Lack of coordination 02/19/2013  . Muscle weakness (generalized) 02/19/2013  . Dysarthria as late effect of stroke 02/04/2013  . Bilateral carotid artery disease ([place]) 02/04/2013  . Other and unspecified hyperlipidemia 02/04/2013  . Adenocarcinoma of left lung, stage 1 ([place]) 10/19/2011  . Pneumothorax of left lung after biopsy 10/14/2011    Class: Acute  . CVA (cerebral infarction)  09/17/2011  . HTN (hypertension) 09/17/2011     Palliative Care Assessment & Plan    1.Code Status:  DNR Code Status History    Date Active Date Inactive Code Status Order ID Comments User Context   11/29/2015 10:07 PM 12/03/2015  7:12 PM DNR [id number]  Norval Morton, MD Inpatient     11/08/2015  2:06 PM 11/17/2015  8:28 PM DNR [id number]  Rod Can, MD Inpatient   11/05/2015 12:55 AM 11/08/2015  2:06 PM DNR [id number]  Orvan Falconer, MD Inpatient   11/04/2015 10:44 PM 11/05/2015 12:55 AM DNR [id number]  Orvan Falconer, MD ED   10/23/2015  7:27 AM 10/23/2015  4:53 PM DNR [id number]  Orvan Falconer, MD Inpatient   10/22/2015  4:04 PM 10/23/2015  7:27 AM Full Code [id number]  Orvan Falconer, MD Inpatient   10/22/2015  2:48 PM 10/22/2015  4:04 PM DNR [id number]  Orvan Falconer, MD ED   03/16/2015  4:18 PM 03/17/2015  3:54 PM DNR [id number]  Radene Gunning, NP ED   10/15/2014 11:43 PM 10/17/2014  9:00 PM DNR [id number]  Oswald Hillock, MD Inpatient   11/18/2011  5:55 PM 11/23/2011  2:17 PM Full Code [id number]  Connye Burkitt, RN Inpatient   10/14/2011  9:21 PM 10/19/2011 12:55 PM Full Code [id number]  Beau Fanny, RN Inpatient    Questions for Most Recent Historical Code Status (Order [id number])    Question Answer Comment   In the event of cardiac or respiratory ARREST Do not call a "code blue"    In the event of cardiac or respiratory ARREST Do not perform Intubation, CPR, defibrillation or ACLS    In the event of cardiac or respiratory ARREST Use medication by any route, position, wound care, and other measures to relive pain and suffering. May use oxygen, suction and manual treatment of airway obstruction as needed for comfort.     Advance Directive Documentation        Most Recent Value   Type of Advance Directive  Out of facility DNR (pink MOST or yellow form)   Pre-existing out of facility DNR order (yellow form or pink MOST form)  Physician notified to receive inpatient order, Yellow form placed in chart (order not valid for inpatient use)   "MOST" Form in Place?         2. Goals of Care/Additional Recommendations:  Full comfort care.   Limitations on Scope of Treatment: Full Comfort Care  Desire for further Chaplaincy support:no  Psycho-social Needs: Education on Hospice  3. Symptom Management:       1.per hospice   4. Palliative Prophylaxis:   Bowel Regimen, Frequent Pain Assessment and Turn Reposition  5. Prognosis: < 2 weeks  6. Discharge Planning:  Hospice facility   Care plan was discussed with nursing staff, Cm, SW, and Dr. Coralyn Pear.   Thank you for allowing the Palliative Medicine Team to assist in the care of this patient.   Time In: 0945 Time Out: 1010 Total Time 25 minutes Prolonged Time Billed  no         Drue Novel, NP  12/04/2015, 1:41 PM  Please contact Palliative Medicine Team phone at [phone] for questions and concerns.

## 2015-12-04 NOTE — Progress Notes (Signed)
Daughters at bedside- they are accepting of plans for hospice home transfer today. Family advising patient of plans to move to a bigger room- and asked CSW, "don't call it hospice" . MD aware and to update dc summary for transfer today via EMS.   Eduard Clos, MSW, [place]

## 2015-12-04 NOTE — Discharge Summary (Addendum)
Expand All Collapse All   Physician Discharge Summary  MICAH GALENO QVZ:[id number] DOB: [birth year] DOA: 11/29/2015  PCP: [name], MD  Admit date: 11/29/2015 Discharge date: 12/04/2015  Time spent: 35 minutes  Recommendations for Outpatient Follow-up:  1. Mrs Vierra will be discharged to inpatient hospice. Family members wish to keep her comfortable. She was discharged on Morphine sulfate and Ativan 2. Symptom management per hospice protocol  Discharge Diagnoses:  Principal Problem:  GI bleed Active Problems:  Anemia  Hypotension  Rectal bleeding  Paroxysmal atrial fibrillation (HCC)  Colitis  Acute kidney injury superimposed on chronic kidney disease ([place])  DNR (do not resuscitate) discussion   Discharge Condition: Stable for transport  Diet recommendation: As tolerated, comfort feeds  Filed Weights   11/29/15 2202  Weight: 62.9 kg (138 lb 10.7 oz)    History of present illness:  Ms. Mckendree is a 75 year old female with a past medical history of CVA, hypertension, hyperlipidemia, CKD stage 3, atrial fibrillation on Eliquis, lung cancer, s/p right hip replacement on 1/8; who presents from skilled nursing facility with complaints of rectal bleeding. Patient's history is obtained from the family, and they state no previous history of bleeding previously. Patient complains of right hip pain. However they've also noticed that her blood pressure has been intermittently low in recent weeks. Family states patient is a DO NOT RESUSCITATE. They deny any NSAID use and patient is on Eliquis for history of atrial fibrillation. Patient did not try anything that made symptoms better or worse.  On admission patient was evaluated and seen to have signs of a colitis of the descending colon on CT of the abdomen and pelvis. There was questionable right kidney involvement. Urinalysis had not been collected at that time however.   Hospital Course:  Mrs Kreeger is a 75 year old  female with multiple comorbidities including history of CVA, atrial fibrillation advanced on chronic anticoagulation, chronic kidney disease, history of lung cancer, recent hip replacement on 11/08/2015, having a decline over the past several months, admitted to the medicine service on 11/29/2015 when she presented with rectal bleeding. Initial workup included a CT scan of abdomen and pelvis that showed evidence of colitis. He was started on empiric IV antibiotic therapy with ciprofloxacin and Flagyl. Family members expressing concerns for her quality of life, stating that 'she would not have wanted to live like this." Palliative care was consulted. Palliative Care held family meeting on 12/02/2015 to discuss medical goals of care. Family members reported that Mrs Schellinger would have wanted to be kept comfortable and agreed that her care focus on comfort and management of symptoms. I spoke with her daughters [name] and [name] on 12/03/2015 reaffrimed goals of care. I explained that life expectancy was limited and explained what changes to expect with end of life. I explained that she could receive comfort feedings understanding that this could lead to aspiration. I also explained that certain medications such as statins and appetite suppressants would not be beneficial at this point and only increase the risk of adverse reactions.   After multiple discussions with family members, they elected for her to continue receiving care at the inpatient hospice facility rather than going to her skilled nursing facility for hospice. She was discharged to inpatient hospice on 12/04/2015. Symptom management per hospice protocol.  Consultations:  Palliative Care  Discharge Exam: Filed Vitals:   12/02/15 2247 12/03/15 0452  BP: 125/40 159/72  Pulse: 68 72  Temp: 99 F (37.2 C) 97.7 F (36.5 C)  Resp: 20 20     General: Chronically ill appearing, nonverbal, not following commands, she seems more  comfortable today, resting comfortably  Cardiovascular: 2/6 SEM, RRR  Respiratory: Lungs appeared CTA, poor inspiratory effort, pt not following commands  Abdomen: Soft, Nontender, nondistended  Musculoskeletal: No edema  Discharge Instructions   Discharge Instructions    Call MD for: difficulty breathing, headache or visual disturbances  Complete by: As directed      Call MD for: extreme fatigue  Complete by: As directed      Call MD for: hives  Complete by: As directed      Call MD for: persistant dizziness or light-headedness  Complete by: As directed      Call MD for: persistant nausea and vomiting  Complete by: As directed      Call MD for: redness, tenderness, or signs of infection (pain, swelling, redness, odor or green/yellow discharge around incision site)  Complete by: As directed      Call MD for: severe uncontrolled pain  Complete by: As directed      Call MD for: temperature >100.4  Complete by: As directed      Call MD for:  Complete by: As directed      Diet - low sodium heart healthy  Complete by: As directed      Increase activity slowly  Complete by: As directed           Current Discharge Medication List    START taking these medications   Details  LORazepam (ATIVAN) 2 MG/ML concentrated solution Take 0.5 mLs (1 mg total) by mouth every 4 (four) hours as needed for anxiety. Qty: 30 mL, Refills: 0    Morphine Sulfate (MORPHINE CONCENTRATE) 10 mg / 0.5 ml concentrated solution Take 0.5 mLs (10 mg total) by mouth every 3 (three) hours as needed for severe pain. Qty: 1 Bottle, Refills: 0      CONTINUE these medications which have NOT CHANGED   Details  acetaminophen (TYLENOL) 500 MG tablet Take 1 tablet (500 mg total) by mouth every 8 (eight) hours. Qty: 30 tablet, Refills: 0    amiodarone (PACERONE) 400 MG tablet Take 1 tablet  (400 mg total) by mouth 2 (two) times daily.    metoprolol (LOPRESSOR) 50 MG tablet Take 1 tablet (50 mg total) by mouth 2 (two) times daily.    traZODone (DESYREL) 25 mg TABS tablet Take 25 mg by mouth at bedtime as needed for sleep.    Maltodextrin-Xanthan Gum (RESOURCE THICKENUP CLEAR) POWD As needed      STOP taking these medications     ALPRAZolam (XANAX) 0.25 MG tablet      apixaban (ELIQUIS) 5 MG TABS tablet      clonazePAM (KLONOPIN) 0.5 MG tablet      DULoxetine (CYMBALTA) 60 MG capsule      omeprazole (PRILOSEC) 20 MG capsule      simvastatin (ZOCOR) 20 MG tablet      traMADol (ULTRAM) 50 MG tablet      pantoprazole (PROTONIX) 20 MG tablet      senna-docusate (SENOKOT-S) 8.6-50 MG per tablet        No Known Allergies     The results of significant diagnostics from this hospitalization (including imaging, microbiology, ancillary and laboratory) are listed below for reference.    Significant Diagnostic Studies:  Imaging Results    Dg Abd 1 View  11/11/2015 CLINICAL DATA: Evaluate NG tube placed EXAM: ABDOMEN - 1 VIEW COMPARISON: 08/23/14  FINDINGS: A weighted feeding tube is identified. The tip is in the expected location of the distal stomach or proximal duodenum. The bowel gas pattern is on unremarkable. No dilated loops of small bowel identified. IMPRESSION: 1. Weighted feeding tube is either in the distal stomach or proximal duodenum. Electronically Signed By: Kerby Moors M.D. On: 11/11/2015 11:31   Ct Head Wo Contrast  11/12/2015 CLINICAL DATA: Initial evaluation for recent acute mental status change. Recent stroke. EXAM: CT HEAD WITHOUT CONTRAST TECHNIQUE: Contiguous axial images were obtained from the base of the skull through the vertex without intravenous contrast. COMPARISON: Previous MRI and CT from 10/22/2015. FINDINGS: Study is somewhat limited by patient positioning. Diffuse prominence  of the CSF containing spaces is compatible with generalized age-related cerebral atrophy. Patchy and confluent hypodensity within the periventricular and deep white matter most compatible with chronic small vessel ischemic disease. Small vessel type changes within the central pons. Intracranial vascular calcifications noted. Remote lacunar infarcts within the bilateral basal ganglia. These changes are stable relative to prior study. No evidence for acute large vessel territory infarct. No intracranial hemorrhage. No mass lesion, mass effect, or midline shift. Ventricular prominence related to global parenchymal volume loss without hydrocephalus. No extra-axial fluid collection. Scalp soft tissues demonstrate no acute abnormality. Globes and orbits within normal limits. Paranasal sinuses are clear. NG tube in place. No mastoid effusion. Calvarium intact IMPRESSION: 1. No acute intracranial process. Specifically, no evidence for acute or recent ischemia. 2. Stable atrophy with chronic microvascular ischemic disease and remote bilateral lacunar infarcts. Electronically Signed By: Jeannine Boga M.D. On: 11/12/2015 20:47   Ct Abdomen Pelvis W Contrast  11/29/2015 CLINICAL DATA: Dark red blood in adult diaper today. On 11/06/2015, with IM nail placed on 11/08/2015. History of lung cancer and hypertension. EXAM: CT ABDOMEN AND PELVIS WITH CONTRAST TECHNIQUE: Multidetector CT imaging of the abdomen and pelvis was performed using the standard protocol following bolus administration of intravenous contrast. CONTRAST: 126m OMNIPAQUE IOHEXOL 300 MG/ML SOLN COMPARISON: Multiple exams, including 04/11/2015 FINDINGS: Lower chest: Interstitial and airspace opacities in both lower lobes into a lesser degree in the right middle lobe. Coronary atherosclerosis and mild cardiomegaly. Mitral annular calcification. Hepatobiliary: Mildly prominent caudate lobe and lateral segment, possibly an early morphologic finding  of cirrhosis. Dependent density in the gallbladder may reflect sludge or small gallstones. Mildly distended gallbladder. No biliary dilatation. No significant abnormal focal hepatic lesion. Pancreas: Unremarkable Spleen: Unremarkable Adrenals/Urinary Tract: Mild bilateral renal atrophy. Bilateral peripelvic cysts. Several small hypodense lesions of the right kidney are statistically likely to represent cysts although it is technically nonspecific due to small size. In addition, there is a 1.6 cm right mid kidney region of hypoenhancement on urographic phase images. There may be some overlying scarring in this vicinity on portal venous phase images. No findings of leak from the urinary bladder. Stomach/Bowel: Sigmoid colon diverticulosis. Wall thickening in the descending colon appears abnormal. Mild pericolic stranding in the descending colon. Vascular/Lymphatic: Aortoiliac atherosclerotic vascular disease. 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 This has not changed appreciably from 04/11/2015. The celiac trunk, SMA, and IMA appear patent. Reproductive: Uterus absent. Left ovary unremarkable, probable small right ovarian remnant likewise unremarkable. Other: If No supplemental non-categorized findings. Musculoskeletal: Right sacral alar fracture again noted. Fractures of the right superior pubic ramus, right pubic body, and right inferior pubic ramus show early healing response. Comminuted left proximal femoral fracture involving the femoral neck and intertrochanteric region observe for thigh and nail  in place, no obvious complicating feature. There still some hematoma and edema in the vicinity of the fractures. Anterior wedging at L1 is likely chronic. There is mild left foraminal stenosis at L4-5 as well as moderate left and prominent right foraminal stenosis at L5-S1 due to disc uncovering, disc bulge, facet arthropathy, and right foraminal disc protrusion at L5-S1. IMPRESSION: 1.  There is abnormal wall thickening in the descending colon with surrounding stranding. I am suspicious for infectious colitis. This could be a cause for rectal bleeding. 2. On urographic phase images there is indistinct hypodensity in the right mid kidney. This is technically nonspecific but could reflect a small region of pyelonephritis. Correlate with urine analysis. 3. Healing fractures of the right pelvis. Right hip IM nail in place traversing comminuted fracture without complicating feature observed. 4. Bibasilar interstitial and airspace opacities, possibly from edema or less likely pneumonia. 5. Other imaging findings of potential clinical significance: Coronary atherosclerosis and mild cardiomegaly. Mitral annular calcification. Suspected early morphologic signs of hepatic cirrhosis. Possible small layering gallstones in the gallbladder. The gallbladder is mildly distended but not thick-walled. Mild bilateral renal atrophy. Bilateral peripelvic cysts. Small right renal cysts. Sigmoid colon diverticulosis. Aortoiliac atherosclerotic vascular disease. Foraminal impingement at L4-5 and L5-S1. Electronically Signed By: Van Clines M.D. On: 11/29/2015 19:26   Ct Hip Right Wo Contrast  11/06/2015 CLINICAL DATA: [place] 2 days ago. Deformity of the right leg. EXAM: CT OF THE RIGHT HIP WITHOUT CONTRAST TECHNIQUE: Multidetector CT imaging of the right hip was performed according to the standard protocol. Multiplanar CT image reconstructions were also generated. COMPARISON: Radiographs 11/03/2005 FINDINGS: There is a severely displaced complex right hip fracture. This is an intertrochanteric fracture with extension into the base of the cervical neck. The greater and lesser trochanter fractures are displaced and comminuted. The femoral head is normally located. The acetabulum is intact. There are superior and inferior pubic rami fractures on the right side also. Significant surrounding hematoma. No  intrapelvic hematoma. The pubic symphysis and right SI joint are intact. Right-sided sacral fractures are also noted. IMPRESSION: 1. Severely displaced and comminuted complex right hip fracture with both intertrochanteric and basicervical neck components. 2. Superior and inferior pubic rami fractures on the right side and right-sided sacral fractures. Electronically Signed By: Marijo Sanes M.D. On: 11/06/2015 17:02   US Carotid Bilateral  11/05/2015 CLINICAL DATA: Recent right femoral fracture and history of TIAs EXAM: BILATERAL CAROTID DUPLEX ULTRASOUND TECHNIQUE: Pearline Cables scale imaging, color Doppler and duplex ultrasound were performed of bilateral carotid and vertebral arteries in the neck. COMPARISON: 02/06/2013 FINDINGS: Criteria: Quantification of carotid stenosis is based on velocity parameters that correlate the residual internal carotid diameter with NASCET-based stenosis levels, using the diameter of the distal internal carotid lumen as the denominator for stenosis measurement. The following velocity measurements were obtained: RIGHT ICA: 118/20 cm/sec CCA: 884/16 cm/sec SYSTOLIC ICA/CCA RATIO: 0.6 DIASTOLIC ICA/CCA RATIO: 0.8 ECA: 102 cm/sec LEFT ICA: 141/25 cm/sec CCA: 606/30 cm/sec SYSTOLIC ICA/CCA RATIO: 1.3 DIASTOLIC ICA/CCA RATIO: 1.5 ECA: 119 cm/sec RIGHT CAROTID ARTERY: Grayscale images demonstrate intimal thickening within the common carotid artery as well as plaque formation in the carotid bulb and extending into the proximal internal carotid artery. The waveforms, velocities and flow velocity ratios show no evidence of focal hemodynamically significant stenosis. RIGHT VERTEBRAL ARTERY: Antegrade in nature. LEFT CAROTID ARTERY: Intimal thickening is noted within the common carotid artery on the left. Plaque formation in the carotid bulb and extending into the proximal internal carotid artery is  noted. The waveforms, velocities and flow velocity ratios again demonstrate a  stenosis in the 50-69% range likely in the lower end of that spectrum. LEFT VERTEBRAL ARTERY: Antegrade in nature. 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 No significant stenosis on the right is noted. Electronically Signed By: Inez Catalina M.D. On: 11/05/2015 11:50   Dg Chest Port 1 View  11/29/2015 CLINICAL DATA: Shortness of breath. EXAM: PORTABLE CHEST 1 VIEW COMPARISON: 11/04/2015 FINDINGS: Increase in diffuse lung opacity. There is a background of low volumes and interstitial lung disease. Chronic cardiomegaly, distorted by leftward rotation and left-sided volume loss. No gross effusion. No pneumothorax. IMPRESSION: 1. Lung opacity above baseline with diffuse appearance suggesting edema. 2. Background interstitial lung disease with fibrosis. Electronically Signed By: Monte Fantasia M.D. On: 11/29/2015 22:57   Dg Chest Portable 1 View  11/04/2015 CLINICAL DATA: Status post fall from wheelchair. Right shoulder pain. Personal history of lung cancer status post left upper lobectomy. Initial encounter. EXAM: PORTABLE CHEST 1 VIEW COMPARISON: Chest radiograph and CTA of the chest performed 10/30/2015 FINDINGS: The appearance of the lungs is stable from the prior study, reflecting diffuse interstitial lung disease with fibrotic change, worse at the left lung base. Known small pulmonary nodules are not well characterized on radiograph. No definite pleural effusion or pneumothorax is seen. The cardiomediastinal silhouette is borderline normal in size. No acute osseous abnormalities are seen. IMPRESSION: 1. Appearance of the lungs is stable from the prior study, reflecting diffuse interstitial lung disease with fibrotic change, worse at the left lung base. Known small pulmonary nodules are not well characterized on radiograph. 2. No displaced rib fracture seen. Electronically Signed By: Garald Balding M.D. On: 11/04/2015 23:50   Dg  Shoulder Right Port  11/04/2015 CLINICAL DATA: Fall from wheelchair. RIGHT shoulder pain EXAM: PORTABLE RIGHT SHOULDER - 2+ VIEW COMPARISON: None. FINDINGS: Glenohumeral joint is intact. No evidence of scapular fracture or humeral fracture. The acromioclavicular joint is intact. IMPRESSION: No fracture or dislocation. Electronically Signed By: Suzy Bouchard M.D. On: 11/04/2015 23:49   Dg Knee Complete 4 Views Right  11/04/2015 CLINICAL DATA: Fall from wheelchair at nursing home prior to arrival, now with right lower extremity pain. Right hip pain radiating to the knee. Initial encounter. EXAM: RIGHT KNEE - COMPLETE 4+ VIEW COMPARISON: None. FINDINGS: Note fracture or dislocation. 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 Detailed evaluation partially obscured related to positioning. No large joint effusion. Vascular calcifications are seen. IMPRESSION: 1. No acute fracture or dislocation. 2. 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 Electronically Signed By: Jeb Levering M.D. On: 11/04/2015 19:19   Dg Abd Portable 1v  11/12/2015 CLINICAL DATA: NG tube placement. EXAM: PORTABLE ABDOMEN - 1 VIEW COMPARISON: Abdominal radiograph obtained yesterday. FINDINGS: Tip of the weighted enteric tube is unchanged in position in the right upper quadrant, in the region of the distal stomach or proximal duodenum. Nonobstructive bowel gas pattern. Moderate stool in the right colon. IMPRESSION: Tip of the weighted enteric tube unchanged in position, either in the distal stomach or proximal duodenum. Electronically Signed By: Jeb Levering M.D. On: 11/12/2015 21:46   Dg Hip Operative Unilat With Pelvis Right  11/08/2015 CLINICAL DATA: IM nail of right hip EXAM: OPERATIVE right HIP (WITH PELVIS IF PERFORMED) 2 VIEWS TECHNIQUE: Fluoroscopic spot image(s) were submitted for interpretation post-operatively. COMPARISON:  11/04/2015 FINDINGS: Hip screw an IM nail reduces the intertrochanteric fracture involving the proximal right femur. The fracture fragments  and hardware components are in anatomic alignment. No complications identified. IMPRESSION: 1. Status post ORIF of proximal femur fracture. Electronically Signed By: Kerby Moors M.D. On: 11/08/2015 11:04   Dg Hip Unilat With Pelvis 2-3 Views Right  11/08/2015 CLINICAL DATA: Postop right hip pinning. Confusion. EXAM: DG HIP (WITH OR WITHOUT PELVIS) 2-3V RIGHT COMPARISON: Intraoperative fluoroscopic spot images from earlier same day. Comparison also made to earlier plain film of the right hip dated 11/04/2015. FINDINGS: Intra medullary rod with associated dynamic hip screw traverses the right femoral neck fracture site. Hardware appears intact and well aligned. Osseous alignment is anatomic. No surgical complicating features seen. IMPRESSION: IMPRESSION Status post fixation of the right femoral neck fracture. Hardware appears intact and well aligned. No surgical complicating feature. Electronically Signed By: Franki Cabot M.D. On: 11/08/2015 12:02   Dg Hip Unilat With Pelvis 2-3 Views Right  11/04/2015 CLINICAL DATA: Pt fell from wheelchair at nursing home prior to arrival to Radiology. Severe pain in Rt hip with radiation into Rt knee. EXAM: DG HIP (WITH OR WITHOUT PELVIS) 2-3V RIGHT COMPARISON: None. FINDINGS: Intertrochanteric fracture of the proximal RIGHT femur with varus angulation. There is superior migration of the distal fracture fragment. No dislocation. IMPRESSION: RIGHT intertrochanteric femur fracture. Electronically Signed By: Suzy Bouchard M.D. On: 11/04/2015 19:20     Microbiology: Recent Results (from the past 240 hour(s))  MRSA PCR Screening Status: None   Collection Time: 11/29/15 11:09 PM  Result Value Ref Range Status   MRSA by PCR NEGATIVE NEGATIVE Final    Comment:   The GeneXpert MRSA Assay  (FDA approved for NASAL specimens only), is one component of a comprehensive MRSA colonization surveillance program. It is not intended to diagnose MRSA infection nor to guide or monitor treatment for MRSA infections.      Labs: Basic Metabolic Panel:  Last Labs      Recent Labs Lab 11/27/15 0719 11/29/15 1840 12/01/15 0521  NA 137 138 140  K 4.0 4.8 4.2  CL 103 105 111  CO2 27 --  22  GLUCOSE 174* 99 100*  BUN 14 36* 22*  CREATININE 0.95 1.80* 1.34*  CALCIUM 8.8* --  8.5*     Liver Function Tests:  Last Labs      Recent Labs Lab 11/29/15 1828 12/01/15 0521  AST 25 23  ALT 17 16  ALKPHOS 184* 183*  BILITOT 1.1 0.6  PROT 6.4* 6.1*  ALBUMIN 2.6* 2.5*      Last Labs     No results for input(s): LIPASE, AMYLASE in the last 168 hours.    Last Labs     No results for input(s): AMMONIA in the last 168 hours.   CBC:  Last Labs      Recent Labs Lab 11/29/15 1828  11/29/15 2232 11/30/15 0249 11/30/15 0704 11/30/15 1027 12/01/15 0521  WBC 9.1 --  --  --  --  --  5.5  NEUTROABS 6.5 --  --  --  --  --  --   HGB 9.3* < > 10.4* 9.1* 9.5* 9.2* 9.0*  HCT 29.1* < > 33.7* 29.0* 29.2* 28.4* 28.3*  MCV 100.3* --  --  --  --  --  100.4*  PLT 249 --  --  --  --  --  178  < > = values in this interval not displayed.   Cardiac Enzymes:  Last Labs     No results for input(s): CKTOTAL, CKMB, CKMBINDEX, TROPONINI in the last 168 hours.  BNP: BNP (last 3 results)  Recent Labs (within last 365 days)    No results for input(s): BNP in the last 8760 hours.    ProBNP (last 3 results)  Recent Labs (within last 365 days)    No results for input(s): PROBNP in the last 8760 hours.    CBG:  Last Labs     No results for input(s): GLUCAP in the last 168 hours.       Signed:  Kelvin Cellar MD.  [facility] 12/03/2015, 9:15 AM         Revision History     Date/Time User Provider Type Action   12/03/2015 4:00 PM Kelvin Cellar, MD Physician Addend   12/03/2015 9:30 AM Kelvin Cellar, MD Physician Sign   View Details Report       Routing History     Date/Time From To Method   12/03/2015 4:00 PM Kelvin Cellar, MD Kathyrn Drown, MD In [facility]

## 2015-12-04 NOTE — Progress Notes (Signed)
[facility] PROGRESS NOTE  Stephanie [name] ZOX:[id number] DOB: [birth year] DOA: 12/03/2015 PCP: [name], MD  Assessment/Plan: 1. End of life care -After multiple discussions with family members on 12/03/2015, family elected for her care to be transferred to inpatient hospice and to focus on comfort and symptomatic management. Overall made to inpatient hospice facility. Bed was not available yesterday. Have been informed by social work that bed is available this morning.  -She appear to be in discomfort this morning for which she was given when necessary dose of Roxanol. Case discussed with palliative care, pain medications may need to be titrated in the inpatient hospice facility.  Code Status: DNR Family Communication: I spoke with Sissy and Kim Disposition Plan: Plan to transfer to inpatient hospice today.   Consultants:  Palliative Care  GI   HPI/Subjective: Stephanie [name] is a 75 year old female with multiple comorbidities including history of CVA, atrial fibrillation advanced on chronic anticoagulation, chronic kidney disease, history of lung cancer, recent hip replacement on 11/08/2015, having a decline over the past several months, admitted to the medicine service on 11/29/2015 when she presented with rectal bleeding. Initial workup included a CT scan of abdomen and pelvis that showed evidence of colitis. He was started on empiric IV antibiotic therapy with ciprofloxacin and Flagyl. Family members expressing concerns for her quality of life, stating that 'she would not have wanted to live like this." Palliative care was consulted.   Objective: Filed Vitals:   12/03/15 2235 12/04/15 0622  BP: 133/54 140/72  Pulse: 78 101  Temp: 98.4 F (36.9 C) 98.2 F (36.8 C)  Resp: 20 18    Intake/Output Summary (Last 24 hours) at 12/04/15 1022 Last data filed at 12/04/15 1002  Gross per 24 hour  Intake      0 ml  Output      0 ml  Net      0 ml   Filed Weights   12/03/15 1732   Weight: 63.504 kg (140 lb)    Exam:   General:  Appears uncomfortable, daughters present at bedside  Cardiovascular: 2/6 SEM, RRR  Respiratory: Lungs appeared CTA, poor inspiratory effort, pt not following commands  Abdomen: Soft, mild generalized tenderness to palpation  Musculoskeletal: No edema  Data Reviewed: Basic Metabolic Panel:  Recent Labs Lab 11/29/15 1840 12/01/15 0521  NA 138 140  K 4.8 4.2  CL 105 111  CO2  --  22  GLUCOSE 99 100*  BUN 36* 22*  CREATININE 1.80* 1.34*  CALCIUM  --  8.5*   Liver Function Tests:  Recent Labs Lab 11/29/15 1828 12/01/15 0521  AST 25 23  ALT 17 16  ALKPHOS 184* 183*  BILITOT 1.1 0.6  PROT 6.4* 6.1*  ALBUMIN 2.6* 2.5*   No results for input(s): LIPASE, AMYLASE in the last 168 hours. No results for input(s): AMMONIA in the last 168 hours. CBC:  Recent Labs Lab 11/29/15 1828  11/29/15 2232 11/30/15 0249 11/30/15 0704 11/30/15 1027 12/01/15 0521  WBC 9.1  --   --   --   --   --  5.5  NEUTROABS 6.5  --   --   --   --   --   --   HGB 9.3*  < > 10.4* 9.1* 9.5* 9.2* 9.0*  HCT 29.1*  < > 33.7* 29.0* 29.2* 28.4* 28.3*  MCV 100.3*  --   --   --   --   --  100.4*  PLT 249  --   --   --   --   --  178  < > = values in this interval not displayed. Cardiac Enzymes: No results for input(s): CKTOTAL, CKMB, CKMBINDEX, TROPONINI in the last 168 hours. BNP (last 3 results) No results for input(s): BNP in the last 8760 hours.  ProBNP (last 3 results) No results for input(s): PROBNP in the last 8760 hours.  CBG: No results for input(s): GLUCAP in the last 168 hours.  Recent Results (from the past 240 hour(s))  MRSA PCR Screening     Status: None   Collection Time: 11/29/15 11:09 PM  Result Value Ref Range Status   MRSA by PCR NEGATIVE NEGATIVE Final    Comment:        The GeneXpert MRSA Assay (FDA approved for NASAL specimens only), is one component of a comprehensive MRSA colonization surveillance program. It  is not intended to diagnose MRSA infection nor to guide or monitor treatment for MRSA infections.      Studies: No results found.  Scheduled Meds: . antiseptic oral rinse  7 mL Mouth Rinse BID  . LORazepam  0.5 mg Oral 3 times per day  . morphine CONCENTRATE  2.6 mg Oral 6 times per day   Continuous Infusions:    Active Problems:   * No active hospital problems. *    Time spent: 15 min    Kelvin Cellar  [facility] Pager [phone]. If 7PM-7AM, please contact night-coverage at [email or web address], password [facility] 12/04/2015, 10:22 AM  LOS: 1 day

## 2015-12-04 NOTE — Progress Notes (Signed)
EMS to transport patient.  IV access left intact to left ac.  Orzal Morphine schduled given prior to transport.  Family in room.  EMS notified patient has no clothes on per patient and family request.  Left via stretcher with EMS for [place] [facility] [place]  Report called to Butch Penny at [facility].

## 2015-12-04 NOTE — Progress Notes (Signed)
Yelling out, MD in room, Ativan and Morphine prn doses given for comfort.  Will continue to monitor.  Family in room.

## 2015-12-08 DIAGNOSIS — E43 Unspecified severe protein-calorie malnutrition: Secondary | ICD-10-CM

## 2015-12-08 NOTE — Discharge Summary (Signed)
Expand All Collapse All   Physician Discharge Summary  VERDENE CRESON FAO:[id number] DOB: [birth year] DOA: 11/29/2015  PCP: [name], MD  Admit date: 11/29/2015 Discharge date: 12/04/2015  Time spent: 35 minutes  Recommendations for Outpatient Follow-up:  1. Stephanie [name] will be discharged to inpatient hospice. Family members wish to keep her comfortable. She was discharged on Morphine sulfate and Ativan 2. Symptom management per hospice protocol  Discharge Diagnoses:  Principal Problem:  GI bleed Active Problems:  Anemia  Hypotension  Rectal bleeding  Paroxysmal atrial fibrillation (HCC)  Colitis  Acute kidney injury superimposed on chronic kidney disease ([place])  DNR (do not resuscitate) discussion Severe Protein Calorie Malnutrition  Discharge Condition: Stable for transport  Diet recommendation: As tolerated, comfort feeds  Filed Weights   11/29/15 2202  Weight: 62.9 kg (138 lb 10.7 oz)    History of present illness:  Ms. Stephanie [name] is a 75 year old female with a past medical history of CVA, hypertension, hyperlipidemia, CKD stage 3, atrial fibrillation on Eliquis, lung cancer, s/p right hip replacement on 1/8; who presents from skilled nursing facility with complaints of rectal bleeding. Patient's history is obtained from the family, and they state no previous history of bleeding previously. Patient complains of right hip pain. However they've also noticed that her blood pressure has been intermittently low in recent weeks. Family states patient is a DO NOT RESUSCITATE. They deny any NSAID use and patient is on Eliquis for history of atrial fibrillation. Patient did not try anything that made symptoms better or worse.  On admission patient was evaluated and seen to have signs of a colitis of the descending colon on CT of the abdomen and pelvis. There was questionable right kidney involvement. Urinalysis had not been collected at that time however.   Hospital  Course:  Stephanie [name] is a 75 year old female with multiple comorbidities including history of CVA, atrial fibrillation advanced on chronic anticoagulation, chronic kidney disease, history of lung cancer, recent hip replacement on 11/08/2015, having a decline over the past several months, admitted to the medicine service on 11/29/2015 when she presented with rectal bleeding. Initial workup included a CT scan of abdomen and pelvis that showed evidence of colitis. He was started on empiric IV antibiotic therapy with ciprofloxacin and Flagyl. Family members expressing concerns for her quality of life, stating that 'she would not have wanted to live like this." Palliative care was consulted. Palliative Care held family meeting on 12/02/2015 to discuss medical goals of care. Family members reported that Stephanie [name] would have wanted to be kept comfortable and agreed that her care focus on comfort and management of symptoms. I spoke with her daughters [name] and [name] on 12/03/2015 reaffrimed goals of care. I explained that life expectancy was limited and explained what changes to expect with end of life. I explained that she could receive comfort feedings understanding that this could lead to aspiration. I also explained that certain medications such as statins and appetite suppressants would not be beneficial at this point and only increase the risk of adverse reactions.   After multiple discussions with family members, they elected for her to continue receiving care at the inpatient hospice facility rather than going to her skilled nursing facility for hospice. She was discharged to inpatient hospice on 12/04/2015. Symptom management per hospice protocol.  Consultations:  Palliative Care  Discharge Exam: Filed Vitals:   12/02/15 2247 12/03/15 0452  BP: 125/40 159/72  Pulse: 68 72  Temp: 99 F (37.2 C) 97.7 F (  36.5 C)  Resp: 20 20     General: Chronically ill appearing, nonverbal, not  following commands, she seems more comfortable today, resting comfortably  Cardiovascular: 2/6 SEM, RRR  Respiratory: Lungs appeared CTA, poor inspiratory effort, pt not following commands  Abdomen: Soft, Nontender, nondistended  Musculoskeletal: No edema  Discharge Instructions   Discharge Instructions    Call MD for: difficulty breathing, headache or visual disturbances  Complete by: As directed      Call MD for: extreme fatigue  Complete by: As directed      Call MD for: hives  Complete by: As directed      Call MD for: persistant dizziness or light-headedness  Complete by: As directed      Call MD for: persistant nausea and vomiting  Complete by: As directed      Call MD for: redness, tenderness, or signs of infection (pain, swelling, redness, odor or green/yellow discharge around incision site)  Complete by: As directed      Call MD for: severe uncontrolled pain  Complete by: As directed      Call MD for: temperature >100.4  Complete by: As directed      Call MD for:  Complete by: As directed      Diet - low sodium heart healthy  Complete by: As directed      Increase activity slowly  Complete by: As directed           Current Discharge Medication List    START taking these medications   Details  LORazepam (ATIVAN) 2 MG/ML concentrated solution Take 0.5 mLs (1 mg total) by mouth every 4 (four) hours as needed for anxiety. Qty: 30 mL, Refills: 0    Morphine Sulfate (MORPHINE CONCENTRATE) 10 mg / 0.5 ml concentrated solution Take 0.5 mLs (10 mg total) by mouth every 3 (three) hours as needed for severe pain. Qty: 1 Bottle, Refills: 0      CONTINUE these medications which have NOT CHANGED   Details  acetaminophen (TYLENOL) 500 MG tablet Take 1 tablet (500 mg total) by mouth every 8 (eight) hours. Qty: 30 tablet, Refills: 0    amiodarone  (PACERONE) 400 MG tablet Take 1 tablet (400 mg total) by mouth 2 (two) times daily.    metoprolol (LOPRESSOR) 50 MG tablet Take 1 tablet (50 mg total) by mouth 2 (two) times daily.    traZODone (DESYREL) 25 mg TABS tablet Take 25 mg by mouth at bedtime as needed for sleep.    Maltodextrin-Xanthan Gum (RESOURCE THICKENUP CLEAR) POWD As needed      STOP taking these medications     ALPRAZolam (XANAX) 0.25 MG tablet      apixaban (ELIQUIS) 5 MG TABS tablet      clonazePAM (KLONOPIN) 0.5 MG tablet      DULoxetine (CYMBALTA) 60 MG capsule      omeprazole (PRILOSEC) 20 MG capsule      simvastatin (ZOCOR) 20 MG tablet      traMADol (ULTRAM) 50 MG tablet      pantoprazole (PROTONIX) 20 MG tablet      senna-docusate (SENOKOT-S) 8.6-50 MG per tablet        No Known Allergies     The results of significant diagnostics from this hospitalization (including imaging, microbiology, ancillary and laboratory) are listed below for reference.    Significant Diagnostic Studies:  Imaging Results    Dg Abd 1 View  11/11/2015 CLINICAL DATA: Evaluate NG tube placed EXAM: ABDOMEN - 1  VIEW COMPARISON: 08/23/14 FINDINGS: A weighted feeding tube is identified. The tip is in the expected location of the distal stomach or proximal duodenum. The bowel gas pattern is on unremarkable. No dilated loops of small bowel identified. IMPRESSION: 1. Weighted feeding tube is either in the distal stomach or proximal duodenum. Electronically Signed By: Kerby Moors M.D. On: 11/11/2015 11:31   Ct Head Wo Contrast  11/12/2015 CLINICAL DATA: Initial evaluation for recent acute mental status change. Recent stroke. EXAM: CT HEAD WITHOUT CONTRAST TECHNIQUE: Contiguous axial images were obtained from the base of the skull through the vertex without intravenous contrast. COMPARISON: Previous MRI and CT from 10/22/2015. FINDINGS: Study is somewhat limited by  patient positioning. Diffuse prominence of the CSF containing spaces is compatible with generalized age-related cerebral atrophy. Patchy and confluent hypodensity within the periventricular and deep white matter most compatible with chronic small vessel ischemic disease. Small vessel type changes within the central pons. Intracranial vascular calcifications noted. Remote lacunar infarcts within the bilateral basal ganglia. These changes are stable relative to prior study. No evidence for acute large vessel territory infarct. No intracranial hemorrhage. No mass lesion, mass effect, or midline shift. Ventricular prominence related to global parenchymal volume loss without hydrocephalus. No extra-axial fluid collection. Scalp soft tissues demonstrate no acute abnormality. Globes and orbits within normal limits. Paranasal sinuses are clear. NG tube in place. No mastoid effusion. Calvarium intact IMPRESSION: 1. No acute intracranial process. Specifically, no evidence for acute or recent ischemia. 2. Stable atrophy with chronic microvascular ischemic disease and remote bilateral lacunar infarcts. Electronically Signed By: Jeannine Boga M.D. On: 11/12/2015 20:47   Ct Abdomen Pelvis W Contrast  11/29/2015 CLINICAL DATA: Dark red blood in adult diaper today. On 11/06/2015, with IM nail placed on 11/08/2015. History of lung cancer and hypertension. EXAM: CT ABDOMEN AND PELVIS WITH CONTRAST TECHNIQUE: Multidetector CT imaging of the abdomen and pelvis was performed using the standard protocol following bolus administration of intravenous contrast. CONTRAST: 161m OMNIPAQUE IOHEXOL 300 MG/ML SOLN COMPARISON: Multiple exams, including 04/11/2015 FINDINGS: Lower chest: Interstitial and airspace opacities in both lower lobes into a lesser degree in the right middle lobe. Coronary atherosclerosis and mild cardiomegaly. Mitral annular calcification. Hepatobiliary: Mildly prominent caudate lobe and lateral segment,  possibly an early morphologic finding of cirrhosis. Dependent density in the gallbladder may reflect sludge or small gallstones. Mildly distended gallbladder. No biliary dilatation. No significant abnormal focal hepatic lesion. Pancreas: Unremarkable Spleen: Unremarkable Adrenals/Urinary Tract: Mild bilateral renal atrophy. Bilateral peripelvic cysts. Several small hypodense lesions of the right kidney are statistically likely to represent cysts although it is technically nonspecific due to small size. In addition, there is a 1.6 cm right mid kidney region of hypoenhancement on urographic phase images. There may be some overlying scarring in this vicinity on portal venous phase images. No findings of leak from the urinary bladder. Stomach/Bowel: Sigmoid colon diverticulosis. Wall thickening in the descending colon appears abnormal. Mild pericolic stranding in the descending colon. Vascular/Lymphatic: Aortoiliac atherosclerotic vascular disease. 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 This has not changed appreciably from 04/11/2015. The celiac trunk, SMA, and IMA appear patent. Reproductive: Uterus absent. Left ovary unremarkable, probable small right ovarian remnant likewise unremarkable. Other: If No supplemental non-categorized findings. Musculoskeletal: Right sacral alar fracture again noted. Fractures of the right superior pubic ramus, right pubic body, and right inferior pubic ramus show early healing response. Comminuted left proximal femoral fracture involving the femoral neck and intertrochanteric region observe for  thigh and nail in place, no obvious complicating feature. There still some hematoma and edema in the vicinity of the fractures. Anterior wedging at L1 is likely chronic. There is mild left foraminal stenosis at L4-5 as well as moderate left and prominent right foraminal stenosis at L5-S1 due to disc uncovering, disc bulge, facet arthropathy, and right foraminal  disc protrusion at L5-S1. IMPRESSION: 1. There is abnormal wall thickening in the descending colon with surrounding stranding. I am suspicious for infectious colitis. This could be a cause for rectal bleeding. 2. On urographic phase images there is indistinct hypodensity in the right mid kidney. This is technically nonspecific but could reflect a small region of pyelonephritis. Correlate with urine analysis. 3. Healing fractures of the right pelvis. Right hip IM nail in place traversing comminuted fracture without complicating feature observed. 4. Bibasilar interstitial and airspace opacities, possibly from edema or less likely pneumonia. 5. Other imaging findings of potential clinical significance: Coronary atherosclerosis and mild cardiomegaly. Mitral annular calcification. Suspected early morphologic signs of hepatic cirrhosis. Possible small layering gallstones in the gallbladder. The gallbladder is mildly distended but not thick-walled. Mild bilateral renal atrophy. Bilateral peripelvic cysts. Small right renal cysts. Sigmoid colon diverticulosis. Aortoiliac atherosclerotic vascular disease. Foraminal impingement at L4-5 and L5-S1. Electronically Signed By: Van Clines M.D. On: 11/29/2015 19:26   Ct Hip Right Wo Contrast  11/06/2015 CLINICAL DATA: [place] 2 days ago. Deformity of the right leg. EXAM: CT OF THE RIGHT HIP WITHOUT CONTRAST TECHNIQUE: Multidetector CT imaging of the right hip was performed according to the standard protocol. Multiplanar CT image reconstructions were also generated. COMPARISON: Radiographs 11/03/2005 FINDINGS: There is a severely displaced complex right hip fracture. This is an intertrochanteric fracture with extension into the base of the cervical neck. The greater and lesser trochanter fractures are displaced and comminuted. The femoral head is normally located. The acetabulum is intact. There are superior and inferior pubic rami fractures on the right side also.  Significant surrounding hematoma. No intrapelvic hematoma. The pubic symphysis and right SI joint are intact. Right-sided sacral fractures are also noted. IMPRESSION: 1. Severely displaced and comminuted complex right hip fracture with both intertrochanteric and basicervical neck components. 2. Superior and inferior pubic rami fractures on the right side and right-sided sacral fractures. Electronically Signed By: Marijo Sanes M.D. On: 11/06/2015 17:02   US Carotid Bilateral  11/05/2015 CLINICAL DATA: Recent right femoral fracture and history of TIAs EXAM: BILATERAL CAROTID DUPLEX ULTRASOUND TECHNIQUE: Pearline Cables scale imaging, color Doppler and duplex ultrasound were performed of bilateral carotid and vertebral arteries in the neck. COMPARISON: 02/06/2013 FINDINGS: Criteria: Quantification of carotid stenosis is based on velocity parameters that correlate the residual internal carotid diameter with NASCET-based stenosis levels, using the diameter of the distal internal carotid lumen as the denominator for stenosis measurement. The following velocity measurements were obtained: RIGHT ICA: 118/20 cm/sec CCA: 400/86 cm/sec SYSTOLIC ICA/CCA RATIO: 0.6 DIASTOLIC ICA/CCA RATIO: 0.8 ECA: 102 cm/sec LEFT ICA: 141/25 cm/sec CCA: 761/95 cm/sec SYSTOLIC ICA/CCA RATIO: 1.3 DIASTOLIC ICA/CCA RATIO: 1.5 ECA: 119 cm/sec RIGHT CAROTID ARTERY: Grayscale images demonstrate intimal thickening within the common carotid artery as well as plaque formation in the carotid bulb and extending into the proximal internal carotid artery. The waveforms, velocities and flow velocity ratios show no evidence of focal hemodynamically significant stenosis. RIGHT VERTEBRAL ARTERY: Antegrade in nature. LEFT CAROTID ARTERY: Intimal thickening is noted within the common carotid artery on the left. Plaque formation in the carotid bulb and extending into the proximal internal  carotid artery is noted. The waveforms, velocities and flow  velocity ratios again demonstrate a stenosis in the 50-69% range likely in the lower end of that spectrum. LEFT VERTEBRAL ARTERY: Antegrade in nature. 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 No significant stenosis on the right is noted. Electronically Signed By: Inez Catalina M.D. On: 11/05/2015 11:50   Dg Chest Port 1 View  11/29/2015 CLINICAL DATA: Shortness of breath. EXAM: PORTABLE CHEST 1 VIEW COMPARISON: 11/04/2015 FINDINGS: Increase in diffuse lung opacity. There is a background of low volumes and interstitial lung disease. Chronic cardiomegaly, distorted by leftward rotation and left-sided volume loss. No gross effusion. No pneumothorax. IMPRESSION: 1. Lung opacity above baseline with diffuse appearance suggesting edema. 2. Background interstitial lung disease with fibrosis. Electronically Signed By: Monte Fantasia M.D. On: 11/29/2015 22:57   Dg Chest Portable 1 View  11/04/2015 CLINICAL DATA: Status post fall from wheelchair. Right shoulder pain. Personal history of lung cancer status post left upper lobectomy. Initial encounter. EXAM: PORTABLE CHEST 1 VIEW COMPARISON: Chest radiograph and CTA of the chest performed 10/30/2015 FINDINGS: The appearance of the lungs is stable from the prior study, reflecting diffuse interstitial lung disease with fibrotic change, worse at the left lung base. Known small pulmonary nodules are not well characterized on radiograph. No definite pleural effusion or pneumothorax is seen. The cardiomediastinal silhouette is borderline normal in size. No acute osseous abnormalities are seen. IMPRESSION: 1. Appearance of the lungs is stable from the prior study, reflecting diffuse interstitial lung disease with fibrotic change, worse at the left lung base. Known small pulmonary nodules are not well characterized on radiograph. 2. No displaced rib fracture seen. Electronically Signed By: Garald Balding  M.D. On: 11/04/2015 23:50   Dg Shoulder Right Port  11/04/2015 CLINICAL DATA: Fall from wheelchair. RIGHT shoulder pain EXAM: PORTABLE RIGHT SHOULDER - 2+ VIEW COMPARISON: None. FINDINGS: Glenohumeral joint is intact. No evidence of scapular fracture or humeral fracture. The acromioclavicular joint is intact. IMPRESSION: No fracture or dislocation. Electronically Signed By: Suzy Bouchard M.D. On: 11/04/2015 23:49   Dg Knee Complete 4 Views Right  11/04/2015 CLINICAL DATA: Fall from wheelchair at nursing home prior to arrival, now with right lower extremity pain. Right hip pain radiating to the knee. Initial encounter. EXAM: RIGHT KNEE - COMPLETE 4+ VIEW COMPARISON: None. FINDINGS: Note fracture or dislocation. 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 Detailed evaluation partially obscured related to positioning. No large joint effusion. Vascular calcifications are seen. IMPRESSION: 1. No acute fracture or dislocation. 2. 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 Electronically Signed By: Jeb Levering M.D. On: 11/04/2015 19:19   Dg Abd Portable 1v  11/12/2015 CLINICAL DATA: NG tube placement. EXAM: PORTABLE ABDOMEN - 1 VIEW COMPARISON: Abdominal radiograph obtained yesterday. FINDINGS: Tip of the weighted enteric tube is unchanged in position in the right upper quadrant, in the region of the distal stomach or proximal duodenum. Nonobstructive bowel gas pattern. Moderate stool in the right colon. IMPRESSION: Tip of the weighted enteric tube unchanged in position, either in the distal stomach or proximal duodenum. Electronically Signed By: Jeb Levering M.D. On: 11/12/2015 21:46   Dg Hip Operative Unilat With Pelvis Right  11/08/2015 CLINICAL DATA: IM nail of right hip EXAM: OPERATIVE right HIP (WITH PELVIS IF PERFORMED) 2 VIEWS TECHNIQUE: Fluoroscopic spot image(s) were submitted for interpretation  post-operatively. COMPARISON: 11/04/2015 FINDINGS: Hip screw an IM nail reduces the intertrochanteric fracture involving the proximal right femur.  The fracture fragments and hardware components are in anatomic alignment. No complications identified. IMPRESSION: 1. Status post ORIF of proximal femur fracture. Electronically Signed By: Kerby Moors M.D. On: 11/08/2015 11:04   Dg Hip Unilat With Pelvis 2-3 Views Right  11/08/2015 CLINICAL DATA: Postop right hip pinning. Confusion. EXAM: DG HIP (WITH OR WITHOUT PELVIS) 2-3V RIGHT COMPARISON: Intraoperative fluoroscopic spot images from earlier same day. Comparison also made to earlier plain film of the right hip dated 11/04/2015. FINDINGS: Intra medullary rod with associated dynamic hip screw traverses the right femoral neck fracture site. Hardware appears intact and well aligned. Osseous alignment is anatomic. No surgical complicating features seen. IMPRESSION: IMPRESSION Status post fixation of the right femoral neck fracture. Hardware appears intact and well aligned. No surgical complicating feature. Electronically Signed By: Franki Cabot M.D. On: 11/08/2015 12:02   Dg Hip Unilat With Pelvis 2-3 Views Right  11/04/2015 CLINICAL DATA: Pt fell from wheelchair at nursing home prior to arrival to Radiology. Severe pain in Rt hip with radiation into Rt knee. EXAM: DG HIP (WITH OR WITHOUT PELVIS) 2-3V RIGHT COMPARISON: None. FINDINGS: Intertrochanteric fracture of the proximal RIGHT femur with varus angulation. There is superior migration of the distal fracture fragment. No dislocation. IMPRESSION: RIGHT intertrochanteric femur fracture. Electronically Signed By: Suzy Bouchard M.D. On: 11/04/2015 19:20     Microbiology: Recent Results (from the past 240 hour(s))  MRSA PCR Screening Status: None   Collection Time: 11/29/15 11:09 PM  Result Value Ref Range Status   MRSA by PCR NEGATIVE NEGATIVE Final    Comment:    The GeneXpert MRSA Assay (FDA approved for NASAL specimens only), is one component of a comprehensive MRSA colonization surveillance program. It is not intended to diagnose MRSA infection nor to guide or monitor treatment for MRSA infections.      Labs: Basic Metabolic Panel:  Last Labs      Recent Labs Lab 11/27/15 0719 11/29/15 1840 12/01/15 0521  NA 137 138 140  K 4.0 4.8 4.2  CL 103 105 111  CO2 27 --  22  GLUCOSE 174* 99 100*  BUN 14 36* 22*  CREATININE 0.95 1.80* 1.34*  CALCIUM 8.8* --  8.5*     Liver Function Tests:  Last Labs      Recent Labs Lab 11/29/15 1828 12/01/15 0521  AST 25 23  ALT 17 16  ALKPHOS 184* 183*  BILITOT 1.1 0.6  PROT 6.4* 6.1*  ALBUMIN 2.6* 2.5*      Last Labs     No results for input(s): LIPASE, AMYLASE in the last 168 hours.    Last Labs     No results for input(s): AMMONIA in the last 168 hours.   CBC:  Last Labs      Recent Labs Lab 11/29/15 1828  11/29/15 2232 11/30/15 0249 11/30/15 0704 11/30/15 1027 12/01/15 0521  WBC 9.1 --  --  --  --  --  5.5  NEUTROABS 6.5 --  --  --  --  --  --   HGB 9.3* < > 10.4* 9.1* 9.5* 9.2* 9.0*  HCT 29.1* < > 33.7* 29.0* 29.2* 28.4* 28.3*  MCV 100.3* --  --  --  --  --  100.4*  PLT 249 --  --  --  --  --  178  < > = values in this interval not displayed.   Cardiac Enzymes:  Last Labs     No results for input(s): CKTOTAL, CKMB, CKMBINDEX, TROPONINI in the last 168  hours.   BNP: BNP (last 3 results)  Recent Labs (within last 365 days)    No results for input(s): BNP in the last 8760 hours.    ProBNP (last 3 results)  Recent Labs (within last 365 days)    No results for input(s): PROBNP in the last 8760 hours.    CBG:  Last Labs     No results for input(s): GLUCAP in the last 168 hours.       Signed:  Kelvin Cellar MD.  [facility] 12/03/2015, 9:15 AM         Revision History     Date/Time User Provider Type Action   12/03/2015 4:00 PM Kelvin Cellar, MD Physician Addend   12/03/2015 9:30 AM Kelvin Cellar, MD Physician Sign   View Details Report       Routing History     Date/Time From To Method   12/03/2015 4:00 PM Kelvin Cellar, MD Kathyrn Drown, MD In [facility]

## 2015-12-30 DEATH — deceased

## 2016-01-06 ENCOUNTER — Ambulatory Visit (HOSPITAL_COMMUNITY): Payer: Medicare Other | Admitting: Hematology & Oncology

## 2017-12-15 IMAGING — MR MR HEAD W/O CM
7 of 10 series · 23 of 48 positions shown · non-contrast
Comparison: Head CT same day.  MRI 09/19/2011.

CLINICAL DATA: Acute onset of left-sided weakness.  Stroke.

EXAM:
MRI HEAD WITHOUT CONTRAST
TECHNIQUE: Multiplanar, multiecho pulse sequences of the brain and surrounding
structures were obtained without intravenous contrast.

[Series 5: T1 · sagittal · 5.0mm · 0.43mm/px · 2 of 21 slices shown (1 of 2)]
[im 1/21]
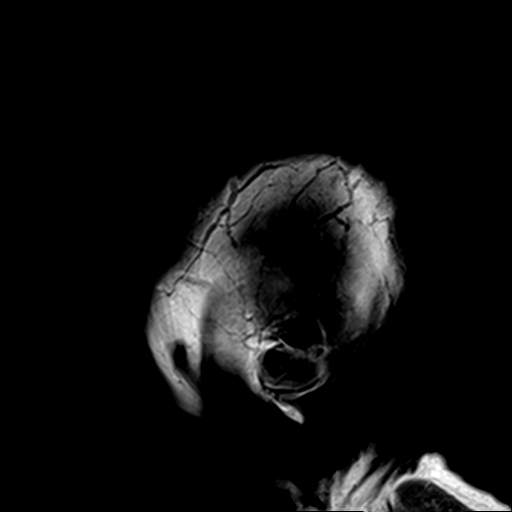
[im 21/21]
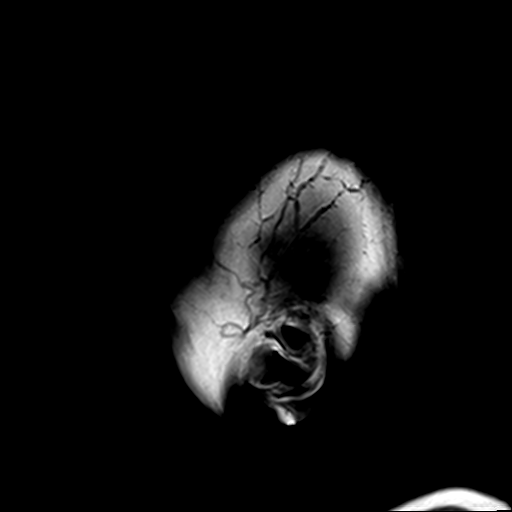

[Series 6: T2 · axial · 5.0mm · 0.47mm/px · z∈[-38,+97]mm · 2 of 23 slices shown (1 of 2)]
[im 1/23]
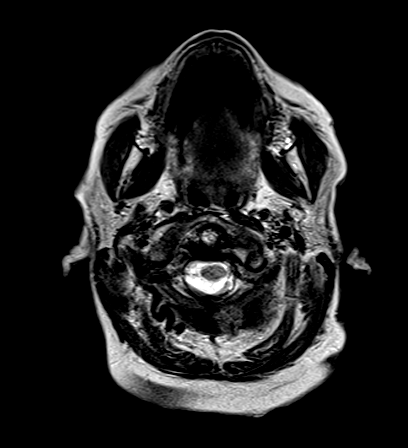
[im 23/23]
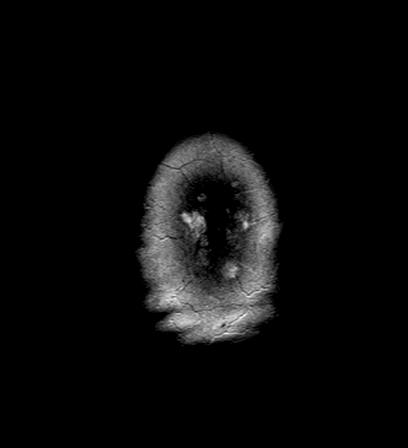

[Series 7: FLAIR · axial · 5.0mm · 0.33mm/px · z∈[-38,+97]mm · 3 of 23 slices shown]
[im 1/23]
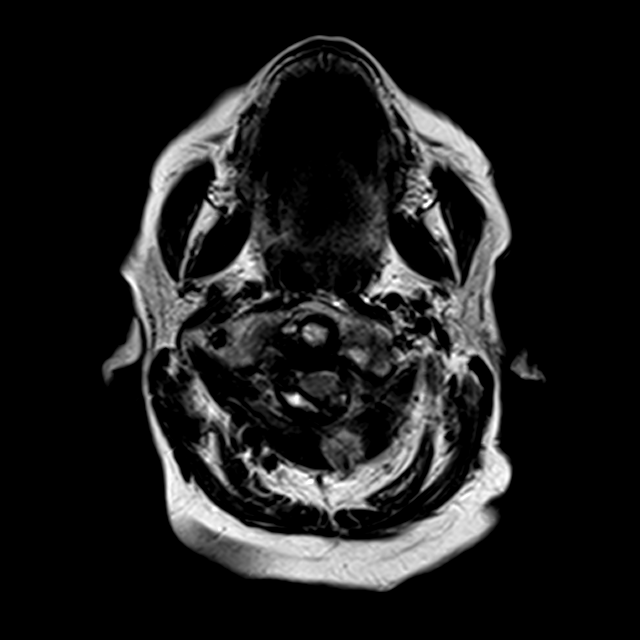
[im 12/23]
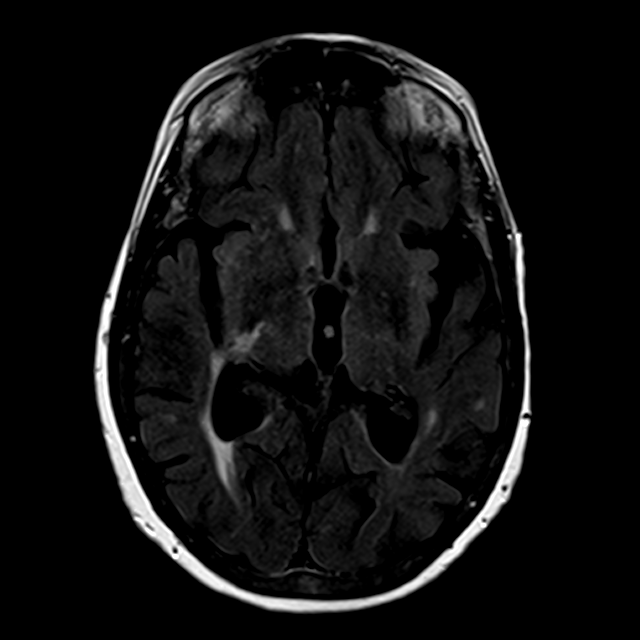
[im 23/23]
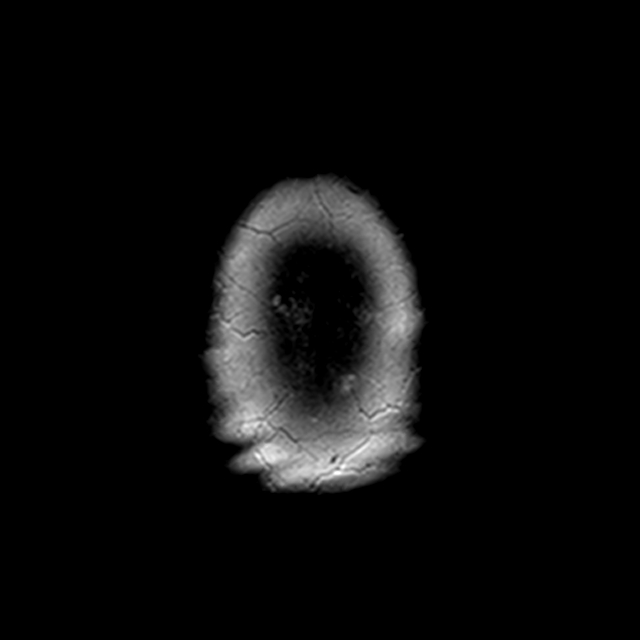

[Series 8: T1 · axial · 2.0mm · 0.40mm/px · z∈[-42,+97]mm · 8 of 75 slices shown (2 of 2)]
[im 1/75]
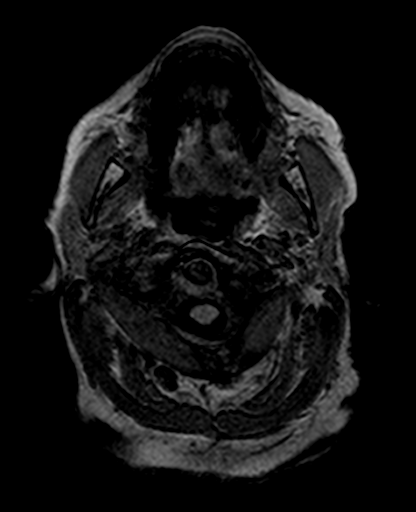
[im 10/75]
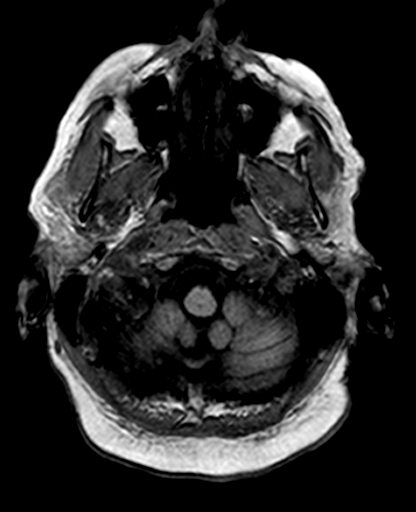
[im 19/75]
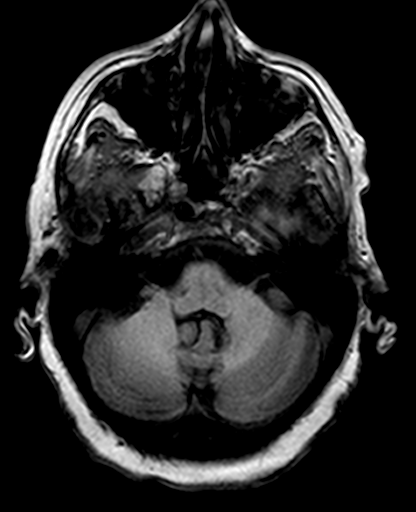
[im 28/75]
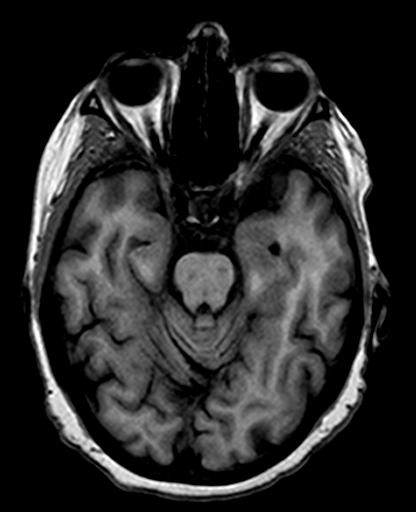
[im 47/75]
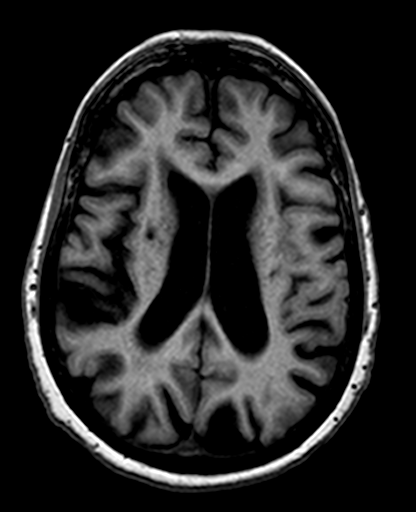
[im 56/75]
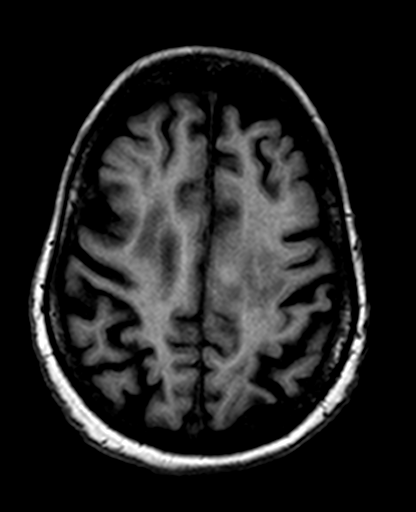
[im 65/75]
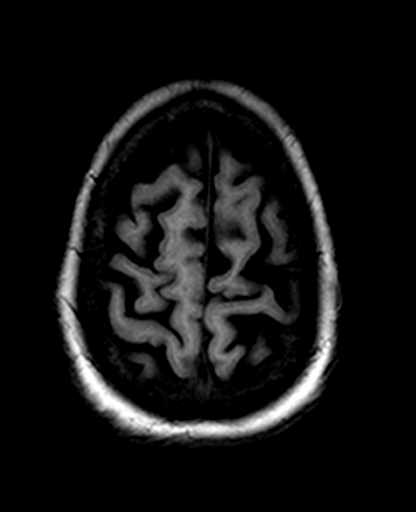
[im 75/75]
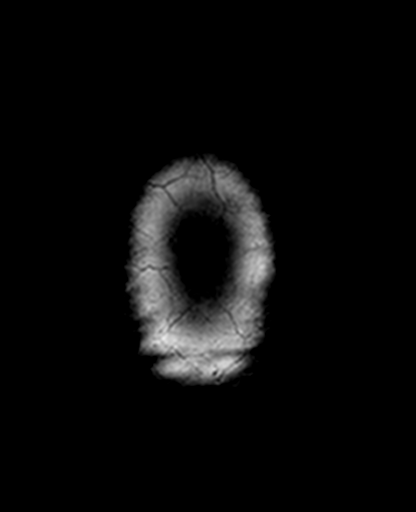

[Series 9: trauma axial · axial · 5.0mm · 0.39mm/px · z∈[-41,+94]mm · 3 of 23 slices shown]
[im 1/23]
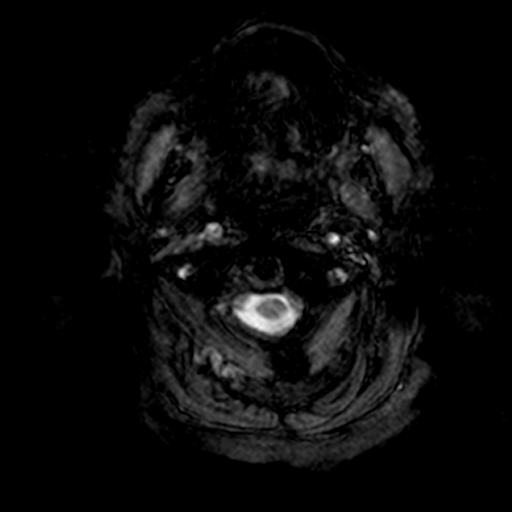
[im 12/23]
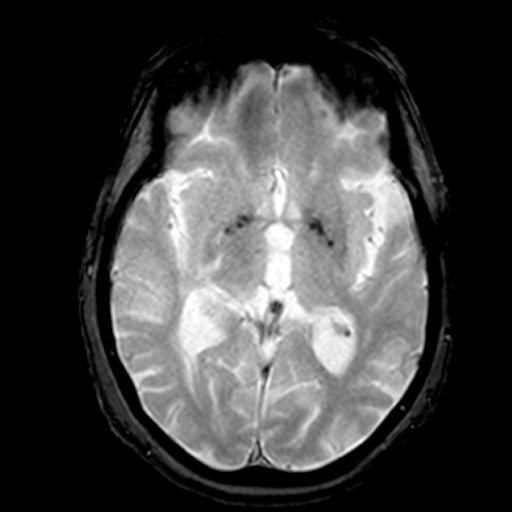
[im 23/23]
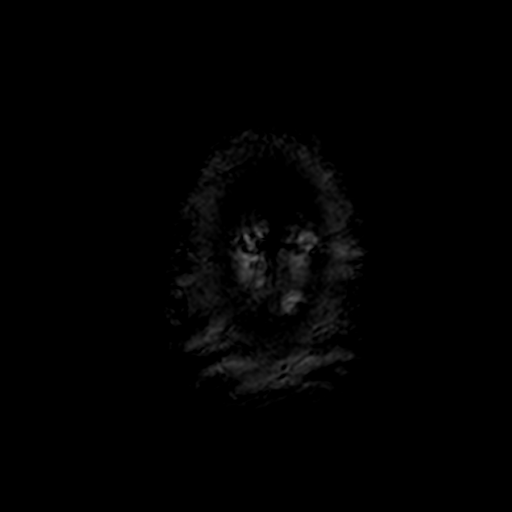

[Series 10: T2 · coronal · 5.0mm · 0.42mm/px · 3 of 26 slices shown (2 of 2)]
[im 1/26]
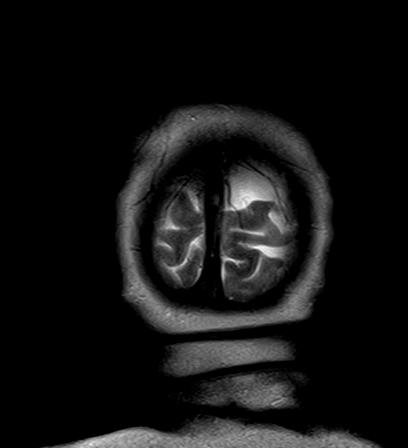
[im 13/26]
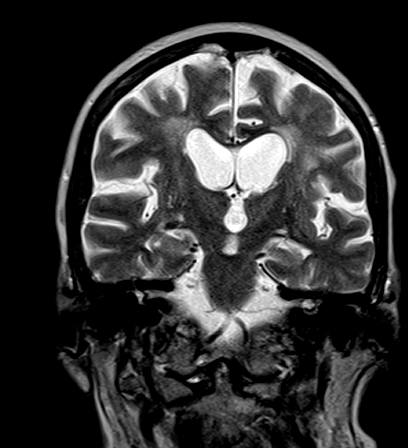
[im 26/26]
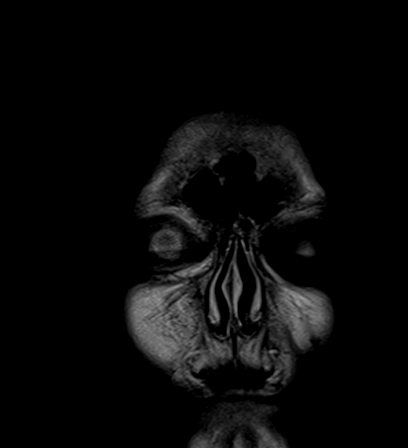

[Series 100: <mpr thick range> · axial · 3.0mm · 0.71mm/px · z∈[-31,-5]mm · 2 of 46 slices shown]
[im 1/46]
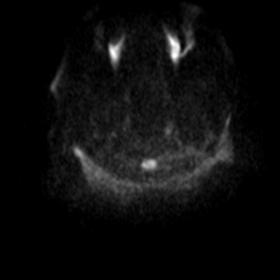
[im 10/46]
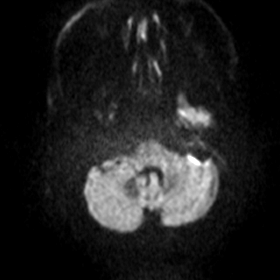

[23 of 48 positions shown; findings below may reference images not displayed]

FINDINGS: The study does suffer from motion degradation. Diffusion imaging
does not show any acute or subacute infarction.

There is generalized brain atrophy. There chronic small-vessel
changes affecting the pons. No cerebellar insult. The cerebral
hemispheres show moderate changes of chronic small vessel disease
within the deep and subcortical white matter. No large vessel
territory infarction. No mass lesion, hemorrhage, hydrocephalus or
extra-axial collection. No pituitary mass. No inflammatory sinus
disease. No skull or skullbase lesion.
